# Patient Record
Sex: Female | Born: 1960 | Race: White | Hispanic: No | Marital: Married | Smoking: Former smoker
Health system: Southern US, Community
[De-identification: ages and names within clinical notes are randomized; demographics above are authoritative.]

## PROBLEM LIST (undated history)

## (undated) DIAGNOSIS — Z9989 Dependence on other enabling machines and devices: Secondary | ICD-10-CM

## (undated) DIAGNOSIS — G4730 Sleep apnea, unspecified: Secondary | ICD-10-CM

## (undated) DIAGNOSIS — G47419 Narcolepsy without cataplexy: Secondary | ICD-10-CM

## (undated) DIAGNOSIS — Z9889 Other specified postprocedural states: Secondary | ICD-10-CM

## (undated) DIAGNOSIS — G4733 Obstructive sleep apnea (adult) (pediatric): Secondary | ICD-10-CM

## (undated) DIAGNOSIS — G43909 Migraine, unspecified, not intractable, without status migrainosus: Secondary | ICD-10-CM

## (undated) DIAGNOSIS — E039 Hypothyroidism, unspecified: Secondary | ICD-10-CM

## (undated) DIAGNOSIS — D499 Neoplasm of unspecified behavior of unspecified site: Secondary | ICD-10-CM

## (undated) DIAGNOSIS — T4145XA Adverse effect of unspecified anesthetic, initial encounter: Secondary | ICD-10-CM

## (undated) DIAGNOSIS — N650 Deformity of reconstructed breast: Secondary | ICD-10-CM

## (undated) DIAGNOSIS — R51 Headache: Secondary | ICD-10-CM

## (undated) DIAGNOSIS — R112 Nausea with vomiting, unspecified: Secondary | ICD-10-CM

## (undated) DIAGNOSIS — F329 Major depressive disorder, single episode, unspecified: Secondary | ICD-10-CM

## (undated) DIAGNOSIS — K219 Gastro-esophageal reflux disease without esophagitis: Secondary | ICD-10-CM

## (undated) DIAGNOSIS — M1990 Unspecified osteoarthritis, unspecified site: Secondary | ICD-10-CM

## (undated) DIAGNOSIS — T8859XA Other complications of anesthesia, initial encounter: Secondary | ICD-10-CM

## (undated) DIAGNOSIS — F419 Anxiety disorder, unspecified: Secondary | ICD-10-CM

## (undated) DIAGNOSIS — C50919 Malignant neoplasm of unspecified site of unspecified female breast: Secondary | ICD-10-CM

## (undated) DIAGNOSIS — R0600 Dyspnea, unspecified: Secondary | ICD-10-CM

## (undated) DIAGNOSIS — G4710 Hypersomnia, unspecified: Secondary | ICD-10-CM

## (undated) DIAGNOSIS — M797 Fibromyalgia: Secondary | ICD-10-CM

## (undated) HISTORY — DX: Anxiety disorder, unspecified: F41.9

## (undated) HISTORY — DX: Major depressive disorder, single episode, unspecified: F32.9

## (undated) HISTORY — PX: GANGLION CYST EXCISION: SHX1691

## (undated) HISTORY — DX: Migraine, unspecified, not intractable, without status migrainosus: G43.909

## (undated) HISTORY — DX: Nausea with vomiting, unspecified: R11.2

## (undated) HISTORY — DX: Malignant neoplasm of unspecified site of unspecified female breast: C50.919

## (undated) HISTORY — DX: Neoplasm of unspecified behavior of unspecified site: D49.9

## (undated) HISTORY — DX: Sleep apnea, unspecified: G47.30

## (undated) HISTORY — PX: OTHER SURGICAL HISTORY: SHX169

## (undated) HISTORY — DX: Headache: R51

## (undated) HISTORY — DX: Other specified postprocedural states: Z98.890

## (undated) HISTORY — DX: Deformity of reconstructed breast: N65.0

## (undated) HISTORY — DX: Narcolepsy without cataplexy: G47.419

## (undated) HISTORY — DX: Obstructive sleep apnea (adult) (pediatric): G47.33

## (undated) HISTORY — DX: Obstructive sleep apnea (adult) (pediatric): Z99.89

## (undated) HISTORY — DX: Hypothyroidism, unspecified: E03.9

## (undated) HISTORY — DX: Hypersomnia, unspecified: G47.10

## (undated) HISTORY — DX: Fibromyalgia: M79.7

---

## 2001-02-15 ENCOUNTER — Other Ambulatory Visit: Admission: RE | Admit: 2001-02-15 | Discharge: 2001-02-15 | Payer: Self-pay | Admitting: Obstetrics and Gynecology

## 2002-02-06 HISTORY — PX: COLONOSCOPY W/ POLYPECTOMY: SHX1380

## 2002-02-20 ENCOUNTER — Encounter: Payer: Self-pay | Admitting: *Deleted

## 2002-02-20 ENCOUNTER — Ambulatory Visit (HOSPITAL_COMMUNITY): Admission: RE | Admit: 2002-02-20 | Discharge: 2002-02-20 | Payer: Self-pay | Admitting: *Deleted

## 2002-03-05 ENCOUNTER — Encounter: Payer: Self-pay | Admitting: Internal Medicine

## 2002-04-05 ENCOUNTER — Other Ambulatory Visit: Admission: RE | Admit: 2002-04-05 | Discharge: 2002-04-05 | Payer: Self-pay | Admitting: Obstetrics and Gynecology

## 2003-03-14 ENCOUNTER — Emergency Department (HOSPITAL_COMMUNITY): Admission: EM | Admit: 2003-03-14 | Discharge: 2003-03-15 | Payer: Self-pay | Admitting: Emergency Medicine

## 2003-11-09 HISTORY — PX: OTHER SURGICAL HISTORY: SHX169

## 2003-12-17 ENCOUNTER — Other Ambulatory Visit: Admission: RE | Admit: 2003-12-17 | Discharge: 2003-12-17 | Payer: Self-pay | Admitting: Obstetrics and Gynecology

## 2005-01-18 ENCOUNTER — Other Ambulatory Visit: Admission: RE | Admit: 2005-01-18 | Discharge: 2005-01-18 | Payer: Self-pay | Admitting: Obstetrics and Gynecology

## 2005-01-26 ENCOUNTER — Encounter: Admission: RE | Admit: 2005-01-26 | Discharge: 2005-01-26 | Payer: Self-pay | Admitting: Obstetrics and Gynecology

## 2005-01-28 ENCOUNTER — Ambulatory Visit (HOSPITAL_COMMUNITY): Admission: RE | Admit: 2005-01-28 | Discharge: 2005-01-28 | Payer: Self-pay | Admitting: Obstetrics and Gynecology

## 2005-02-11 ENCOUNTER — Observation Stay (HOSPITAL_COMMUNITY): Admission: RE | Admit: 2005-02-11 | Discharge: 2005-02-12 | Payer: Self-pay | Admitting: Obstetrics and Gynecology

## 2005-02-11 ENCOUNTER — Encounter (INDEPENDENT_AMBULATORY_CARE_PROVIDER_SITE_OTHER): Payer: Self-pay | Admitting: Specialist

## 2005-03-22 ENCOUNTER — Inpatient Hospital Stay (HOSPITAL_COMMUNITY): Admission: AD | Admit: 2005-03-22 | Discharge: 2005-03-22 | Payer: Self-pay | Admitting: Obstetrics and Gynecology

## 2005-08-11 ENCOUNTER — Ambulatory Visit: Payer: Self-pay | Admitting: Internal Medicine

## 2005-09-01 ENCOUNTER — Encounter: Admission: RE | Admit: 2005-09-01 | Discharge: 2005-09-01 | Payer: Self-pay | Admitting: Obstetrics and Gynecology

## 2005-10-05 ENCOUNTER — Ambulatory Visit: Payer: Self-pay | Admitting: Internal Medicine

## 2006-02-11 ENCOUNTER — Encounter: Admission: RE | Admit: 2006-02-11 | Discharge: 2006-02-11 | Payer: Self-pay | Admitting: Orthopedic Surgery

## 2006-02-23 ENCOUNTER — Encounter: Admission: RE | Admit: 2006-02-23 | Discharge: 2006-02-23 | Payer: Self-pay | Admitting: Orthopedic Surgery

## 2006-03-09 ENCOUNTER — Encounter: Admission: RE | Admit: 2006-03-09 | Discharge: 2006-03-09 | Payer: Self-pay | Admitting: Orthopedic Surgery

## 2006-03-25 ENCOUNTER — Encounter: Admission: RE | Admit: 2006-03-25 | Discharge: 2006-03-25 | Payer: Self-pay | Admitting: Orthopedic Surgery

## 2006-03-31 ENCOUNTER — Encounter: Admission: RE | Admit: 2006-03-31 | Discharge: 2006-03-31 | Payer: Self-pay | Admitting: Obstetrics and Gynecology

## 2006-04-13 ENCOUNTER — Ambulatory Visit: Payer: Self-pay | Admitting: Internal Medicine

## 2006-04-13 ENCOUNTER — Emergency Department (HOSPITAL_COMMUNITY): Admission: EM | Admit: 2006-04-13 | Discharge: 2006-04-13 | Payer: Self-pay | Admitting: Emergency Medicine

## 2006-11-08 HISTORY — PX: MASTECTOMY: SHX3

## 2006-11-08 HISTORY — PX: OTHER SURGICAL HISTORY: SHX169

## 2006-12-26 ENCOUNTER — Encounter: Admission: RE | Admit: 2006-12-26 | Discharge: 2006-12-26 | Payer: Self-pay | Admitting: Orthopaedic Surgery

## 2007-01-06 ENCOUNTER — Encounter: Admission: RE | Admit: 2007-01-06 | Discharge: 2007-01-06 | Payer: Self-pay | Admitting: Orthopaedic Surgery

## 2007-01-23 ENCOUNTER — Ambulatory Visit: Payer: Self-pay | Admitting: Internal Medicine

## 2007-07-04 ENCOUNTER — Encounter (INDEPENDENT_AMBULATORY_CARE_PROVIDER_SITE_OTHER): Payer: Self-pay | Admitting: Diagnostic Radiology

## 2007-07-04 ENCOUNTER — Encounter: Admission: RE | Admit: 2007-07-04 | Discharge: 2007-07-04 | Payer: Self-pay | Admitting: Obstetrics and Gynecology

## 2007-07-05 ENCOUNTER — Encounter: Payer: Self-pay | Admitting: Internal Medicine

## 2007-07-05 DIAGNOSIS — Z860100 Personal history of colon polyps, unspecified: Secondary | ICD-10-CM | POA: Insufficient documentation

## 2007-07-05 DIAGNOSIS — N840 Polyp of corpus uteri: Secondary | ICD-10-CM

## 2007-07-05 DIAGNOSIS — Z87410 Personal history of cervical dysplasia: Secondary | ICD-10-CM

## 2007-07-05 DIAGNOSIS — IMO0001 Reserved for inherently not codable concepts without codable children: Secondary | ICD-10-CM | POA: Insufficient documentation

## 2007-07-05 DIAGNOSIS — F329 Major depressive disorder, single episode, unspecified: Secondary | ICD-10-CM | POA: Insufficient documentation

## 2007-07-11 ENCOUNTER — Encounter: Admission: RE | Admit: 2007-07-11 | Discharge: 2007-07-11 | Payer: Self-pay | Admitting: Obstetrics and Gynecology

## 2007-07-13 ENCOUNTER — Ambulatory Visit: Payer: Self-pay | Admitting: Oncology

## 2007-07-18 ENCOUNTER — Ambulatory Visit (HOSPITAL_COMMUNITY): Admission: RE | Admit: 2007-07-18 | Discharge: 2007-07-18 | Payer: Self-pay | Admitting: General Surgery

## 2007-07-19 LAB — CBC WITH DIFFERENTIAL/PLATELET
Basophils Absolute: 0 10*3/uL (ref 0.0–0.1)
EOS%: 1 % (ref 0.0–7.0)
Eosinophils Absolute: 0.1 10*3/uL (ref 0.0–0.5)
HGB: 13.2 g/dL (ref 11.6–15.9)
MCH: 29.5 pg (ref 26.0–34.0)
NEUT#: 4.3 10*3/uL (ref 1.5–6.5)
RBC: 4.47 10*6/uL (ref 3.70–5.32)
RDW: 12.8 % (ref 11.3–14.5)
lymph#: 1.5 10*3/uL (ref 0.9–3.3)

## 2007-07-21 ENCOUNTER — Ambulatory Visit (HOSPITAL_COMMUNITY): Admission: RE | Admit: 2007-07-21 | Discharge: 2007-07-21 | Payer: Self-pay | Admitting: General Surgery

## 2007-07-21 ENCOUNTER — Encounter: Payer: Self-pay | Admitting: Oncology

## 2007-07-24 LAB — COMPREHENSIVE METABOLIC PANEL
AST: 15 U/L (ref 0–37)
Albumin: 4.3 g/dL (ref 3.5–5.2)
BUN: 13 mg/dL (ref 6–23)
Calcium: 9.4 mg/dL (ref 8.4–10.5)
Chloride: 104 mEq/L (ref 96–112)
Potassium: 3.5 mEq/L (ref 3.5–5.3)
Sodium: 139 mEq/L (ref 135–145)
Total Protein: 6.7 g/dL (ref 6.0–8.3)

## 2007-07-24 LAB — VITAMIN D PNL(25-HYDRXY+1,25-DIHY)-BLD: Vit D, 1,25-Dihydroxy: 64 pg/mL — ABNORMAL HIGH (ref 6–62)

## 2007-07-25 ENCOUNTER — Ambulatory Visit (HOSPITAL_COMMUNITY): Payer: Self-pay | Admitting: Oncology

## 2007-07-25 ENCOUNTER — Encounter (HOSPITAL_COMMUNITY): Admission: RE | Admit: 2007-07-25 | Discharge: 2007-08-08 | Payer: Self-pay | Admitting: Oncology

## 2007-07-28 ENCOUNTER — Ambulatory Visit: Payer: Self-pay | Admitting: Internal Medicine

## 2007-07-28 ENCOUNTER — Ambulatory Visit (HOSPITAL_COMMUNITY): Admission: RE | Admit: 2007-07-28 | Discharge: 2007-07-28 | Payer: Self-pay | Admitting: Oncology

## 2007-08-15 ENCOUNTER — Encounter (HOSPITAL_COMMUNITY): Admission: RE | Admit: 2007-08-15 | Discharge: 2007-09-14 | Payer: Self-pay | Admitting: Oncology

## 2007-08-23 ENCOUNTER — Ambulatory Visit: Admission: RE | Admit: 2007-08-23 | Discharge: 2007-10-30 | Payer: Self-pay | Admitting: Radiation Oncology

## 2007-09-06 ENCOUNTER — Ambulatory Visit: Payer: Self-pay | Admitting: Cardiology

## 2007-09-06 ENCOUNTER — Encounter: Payer: Self-pay | Admitting: Internal Medicine

## 2007-09-12 ENCOUNTER — Ambulatory Visit (HOSPITAL_COMMUNITY): Payer: Self-pay | Admitting: Oncology

## 2007-09-19 ENCOUNTER — Encounter: Admission: RE | Admit: 2007-09-19 | Discharge: 2007-09-19 | Payer: Self-pay | Admitting: Oncology

## 2007-09-21 ENCOUNTER — Encounter: Admission: RE | Admit: 2007-09-21 | Discharge: 2007-09-21 | Payer: Self-pay | Admitting: General Surgery

## 2007-09-26 ENCOUNTER — Encounter (HOSPITAL_COMMUNITY): Admission: RE | Admit: 2007-09-26 | Discharge: 2007-10-26 | Payer: Self-pay | Admitting: Oncology

## 2007-09-29 ENCOUNTER — Encounter: Payer: Self-pay | Admitting: Internal Medicine

## 2007-10-13 ENCOUNTER — Ambulatory Visit: Payer: Self-pay | Admitting: Oncology

## 2007-10-20 ENCOUNTER — Telehealth: Payer: Self-pay | Admitting: Internal Medicine

## 2007-10-23 ENCOUNTER — Encounter: Payer: Self-pay | Admitting: Internal Medicine

## 2007-10-27 ENCOUNTER — Encounter: Payer: Self-pay | Admitting: Internal Medicine

## 2007-10-30 ENCOUNTER — Encounter (INDEPENDENT_AMBULATORY_CARE_PROVIDER_SITE_OTHER): Payer: Self-pay | Admitting: General Surgery

## 2007-10-30 ENCOUNTER — Ambulatory Visit (HOSPITAL_COMMUNITY): Admission: RE | Admit: 2007-10-30 | Discharge: 2007-10-31 | Payer: Self-pay | Admitting: General Surgery

## 2007-11-21 ENCOUNTER — Encounter: Payer: Self-pay | Admitting: Internal Medicine

## 2007-11-21 ENCOUNTER — Ambulatory Visit (HOSPITAL_COMMUNITY): Payer: Self-pay | Admitting: Oncology

## 2007-11-28 ENCOUNTER — Ambulatory Visit: Admission: RE | Admit: 2007-11-28 | Discharge: 2007-12-22 | Payer: Self-pay | Admitting: Radiation Oncology

## 2007-12-22 ENCOUNTER — Encounter: Payer: Self-pay | Admitting: Internal Medicine

## 2007-12-28 ENCOUNTER — Ambulatory Visit: Admission: RE | Admit: 2007-12-28 | Discharge: 2008-03-05 | Payer: Self-pay | Admitting: Radiation Oncology

## 2008-01-01 ENCOUNTER — Emergency Department (HOSPITAL_COMMUNITY): Admission: EM | Admit: 2008-01-01 | Discharge: 2008-01-02 | Payer: Self-pay | Admitting: Emergency Medicine

## 2008-01-04 ENCOUNTER — Telehealth: Payer: Self-pay | Admitting: Internal Medicine

## 2008-01-05 ENCOUNTER — Ambulatory Visit: Payer: Self-pay | Admitting: Endocrinology

## 2008-01-05 DIAGNOSIS — R05 Cough: Secondary | ICD-10-CM

## 2008-02-20 ENCOUNTER — Encounter: Payer: Self-pay | Admitting: Internal Medicine

## 2008-02-22 ENCOUNTER — Ambulatory Visit: Payer: Self-pay | Admitting: Internal Medicine

## 2008-02-22 DIAGNOSIS — M545 Low back pain: Secondary | ICD-10-CM

## 2008-02-22 DIAGNOSIS — R011 Cardiac murmur, unspecified: Secondary | ICD-10-CM

## 2008-02-22 DIAGNOSIS — R5383 Other fatigue: Secondary | ICD-10-CM

## 2008-02-22 DIAGNOSIS — L03319 Cellulitis of trunk, unspecified: Secondary | ICD-10-CM

## 2008-02-22 DIAGNOSIS — R5381 Other malaise: Secondary | ICD-10-CM | POA: Insufficient documentation

## 2008-02-22 DIAGNOSIS — Z853 Personal history of malignant neoplasm of breast: Secondary | ICD-10-CM

## 2008-02-22 DIAGNOSIS — R42 Dizziness and giddiness: Secondary | ICD-10-CM

## 2008-02-22 DIAGNOSIS — L02219 Cutaneous abscess of trunk, unspecified: Secondary | ICD-10-CM

## 2008-02-22 DIAGNOSIS — R002 Palpitations: Secondary | ICD-10-CM | POA: Insufficient documentation

## 2008-02-22 LAB — CONVERTED CEMR LAB: Vit D, 1,25-Dihydroxy: 32 (ref 30–89)

## 2008-02-23 LAB — CONVERTED CEMR LAB
Albumin: 4 g/dL (ref 3.5–5.2)
BUN: 9 mg/dL (ref 6–23)
Bilirubin Urine: NEGATIVE
Bilirubin, Direct: 0.1 mg/dL (ref 0.0–0.3)
Calcium: 10.1 mg/dL (ref 8.4–10.5)
Eosinophils Absolute: 0.1 10*3/uL (ref 0.0–0.7)
GFR calc Af Amer: 115 mL/min
Glucose, Bld: 84 mg/dL (ref 70–99)
HCT: 41 % (ref 36.0–46.0)
Hemoglobin: 13.9 g/dL (ref 12.0–15.0)
Ketones, ur: NEGATIVE mg/dL
MCV: 82.9 fL (ref 78.0–100.0)
Monocytes Absolute: 0.5 10*3/uL (ref 0.1–1.0)
Monocytes Relative: 10.9 % (ref 3.0–12.0)
Neutro Abs: 3.5 10*3/uL (ref 1.4–7.7)
Nitrite: NEGATIVE
RDW: 13.1 % (ref 11.5–14.6)
Sodium: 140 meq/L (ref 135–145)
TSH: 2.12 microintl units/mL (ref 0.35–5.50)
Total Protein, Urine: NEGATIVE mg/dL
Total Protein: 6.9 g/dL (ref 6.0–8.3)
Urine Glucose: NEGATIVE mg/dL
pH: 7.5 (ref 5.0–8.0)

## 2008-02-28 ENCOUNTER — Encounter: Payer: Self-pay | Admitting: Internal Medicine

## 2008-02-28 ENCOUNTER — Ambulatory Visit: Payer: Self-pay

## 2008-02-29 ENCOUNTER — Telehealth: Payer: Self-pay | Admitting: Internal Medicine

## 2008-03-22 ENCOUNTER — Ambulatory Visit (HOSPITAL_COMMUNITY): Payer: Self-pay | Admitting: Oncology

## 2008-03-22 ENCOUNTER — Encounter: Payer: Self-pay | Admitting: Internal Medicine

## 2008-05-08 HISTORY — PX: OTHER SURGICAL HISTORY: SHX169

## 2008-05-22 ENCOUNTER — Encounter: Payer: Self-pay | Admitting: Internal Medicine

## 2008-07-23 ENCOUNTER — Encounter (HOSPITAL_COMMUNITY): Admission: RE | Admit: 2008-07-23 | Discharge: 2008-08-05 | Payer: Self-pay | Admitting: Oncology

## 2008-07-23 ENCOUNTER — Encounter: Payer: Self-pay | Admitting: Internal Medicine

## 2008-07-23 ENCOUNTER — Ambulatory Visit (HOSPITAL_COMMUNITY): Payer: Self-pay | Admitting: Oncology

## 2008-10-25 ENCOUNTER — Encounter: Payer: Self-pay | Admitting: Internal Medicine

## 2008-11-15 DIAGNOSIS — K648 Other hemorrhoids: Secondary | ICD-10-CM | POA: Insufficient documentation

## 2008-11-15 DIAGNOSIS — K5730 Diverticulosis of large intestine without perforation or abscess without bleeding: Secondary | ICD-10-CM | POA: Insufficient documentation

## 2008-11-18 ENCOUNTER — Ambulatory Visit: Payer: Self-pay | Admitting: Internal Medicine

## 2008-11-18 DIAGNOSIS — K625 Hemorrhage of anus and rectum: Secondary | ICD-10-CM

## 2008-11-18 DIAGNOSIS — K5900 Constipation, unspecified: Secondary | ICD-10-CM | POA: Insufficient documentation

## 2008-11-19 ENCOUNTER — Encounter: Admission: RE | Admit: 2008-11-19 | Discharge: 2008-11-19 | Payer: Self-pay | Admitting: Oncology

## 2008-11-19 ENCOUNTER — Encounter: Payer: Self-pay | Admitting: Internal Medicine

## 2008-11-21 ENCOUNTER — Ambulatory Visit: Payer: Self-pay | Admitting: Internal Medicine

## 2008-11-21 ENCOUNTER — Encounter: Payer: Self-pay | Admitting: Internal Medicine

## 2008-11-26 ENCOUNTER — Encounter: Payer: Self-pay | Admitting: Internal Medicine

## 2008-12-01 ENCOUNTER — Emergency Department (HOSPITAL_COMMUNITY): Admission: EM | Admit: 2008-12-01 | Discharge: 2008-12-02 | Payer: Self-pay | Admitting: Emergency Medicine

## 2008-12-02 ENCOUNTER — Ambulatory Visit: Payer: Self-pay | Admitting: *Deleted

## 2008-12-02 ENCOUNTER — Inpatient Hospital Stay (HOSPITAL_COMMUNITY): Admission: AD | Admit: 2008-12-02 | Discharge: 2008-12-06 | Payer: Self-pay | Admitting: *Deleted

## 2008-12-10 ENCOUNTER — Ambulatory Visit: Payer: Self-pay | Admitting: Psychiatry

## 2008-12-11 ENCOUNTER — Telehealth: Payer: Self-pay | Admitting: Internal Medicine

## 2008-12-17 ENCOUNTER — Encounter: Payer: Self-pay | Admitting: Internal Medicine

## 2008-12-17 ENCOUNTER — Ambulatory Visit: Payer: Self-pay | Admitting: Psychiatry

## 2009-02-24 ENCOUNTER — Ambulatory Visit (HOSPITAL_COMMUNITY): Payer: Self-pay | Admitting: Oncology

## 2009-02-24 ENCOUNTER — Encounter: Payer: Self-pay | Admitting: Internal Medicine

## 2009-02-25 ENCOUNTER — Encounter: Admission: RE | Admit: 2009-02-25 | Discharge: 2009-02-25 | Payer: Self-pay | Admitting: Oncology

## 2009-04-08 ENCOUNTER — Encounter: Payer: Self-pay | Admitting: Internal Medicine

## 2009-04-14 ENCOUNTER — Encounter: Payer: Self-pay | Admitting: Internal Medicine

## 2009-05-08 ENCOUNTER — Encounter: Payer: Self-pay | Admitting: Internal Medicine

## 2009-05-09 ENCOUNTER — Encounter (INDEPENDENT_AMBULATORY_CARE_PROVIDER_SITE_OTHER): Payer: Self-pay | Admitting: Orthopedic Surgery

## 2009-05-09 ENCOUNTER — Ambulatory Visit (HOSPITAL_BASED_OUTPATIENT_CLINIC_OR_DEPARTMENT_OTHER): Admission: RE | Admit: 2009-05-09 | Discharge: 2009-05-09 | Payer: Self-pay | Admitting: Orthopedic Surgery

## 2009-08-12 ENCOUNTER — Encounter: Payer: Self-pay | Admitting: Internal Medicine

## 2009-08-25 ENCOUNTER — Ambulatory Visit (HOSPITAL_COMMUNITY): Payer: Self-pay | Admitting: Oncology

## 2009-08-25 ENCOUNTER — Encounter (HOSPITAL_COMMUNITY): Admission: RE | Admit: 2009-08-25 | Discharge: 2009-09-24 | Payer: Self-pay | Admitting: Oncology

## 2009-08-25 ENCOUNTER — Encounter: Payer: Self-pay | Admitting: Internal Medicine

## 2009-11-04 ENCOUNTER — Encounter: Admission: RE | Admit: 2009-11-04 | Discharge: 2009-11-04 | Payer: Self-pay | Admitting: General Surgery

## 2009-11-08 HISTORY — PX: CARPAL TUNNEL RELEASE: SHX101

## 2009-11-14 ENCOUNTER — Encounter: Admission: RE | Admit: 2009-11-14 | Discharge: 2009-12-25 | Payer: Self-pay | Admitting: Diagnostic Radiology

## 2009-11-24 ENCOUNTER — Telehealth (INDEPENDENT_AMBULATORY_CARE_PROVIDER_SITE_OTHER): Payer: Self-pay | Admitting: *Deleted

## 2009-12-23 ENCOUNTER — Ambulatory Visit: Payer: Self-pay | Admitting: Psychiatry

## 2010-01-20 ENCOUNTER — Ambulatory Visit (HOSPITAL_BASED_OUTPATIENT_CLINIC_OR_DEPARTMENT_OTHER): Admission: RE | Admit: 2010-01-20 | Discharge: 2010-01-20 | Payer: Self-pay | Admitting: Orthopedic Surgery

## 2010-02-17 ENCOUNTER — Ambulatory Visit (HOSPITAL_COMMUNITY): Payer: Self-pay | Admitting: Oncology

## 2010-03-10 ENCOUNTER — Emergency Department (HOSPITAL_COMMUNITY): Admission: EM | Admit: 2010-03-10 | Discharge: 2010-03-11 | Payer: Self-pay | Admitting: Emergency Medicine

## 2010-03-11 ENCOUNTER — Inpatient Hospital Stay (HOSPITAL_COMMUNITY): Admission: AD | Admit: 2010-03-11 | Discharge: 2010-03-17 | Payer: Self-pay | Admitting: Psychiatry

## 2010-03-11 ENCOUNTER — Ambulatory Visit: Payer: Self-pay | Admitting: Psychiatry

## 2010-03-18 ENCOUNTER — Other Ambulatory Visit (HOSPITAL_COMMUNITY): Admission: RE | Admit: 2010-03-18 | Discharge: 2010-03-31 | Payer: Self-pay | Admitting: Psychiatry

## 2010-03-23 ENCOUNTER — Ambulatory Visit: Payer: Self-pay | Admitting: Psychiatry

## 2010-04-22 ENCOUNTER — Ambulatory Visit (HOSPITAL_COMMUNITY): Payer: Self-pay | Admitting: Psychiatry

## 2010-04-27 ENCOUNTER — Ambulatory Visit: Payer: Self-pay | Admitting: Psychiatry

## 2010-04-27 ENCOUNTER — Emergency Department (HOSPITAL_COMMUNITY): Admission: EM | Admit: 2010-04-27 | Discharge: 2010-04-27 | Payer: Self-pay | Admitting: Emergency Medicine

## 2010-04-27 ENCOUNTER — Inpatient Hospital Stay (HOSPITAL_COMMUNITY): Admission: AD | Admit: 2010-04-27 | Discharge: 2010-05-01 | Payer: Self-pay | Admitting: Psychiatry

## 2010-07-02 ENCOUNTER — Ambulatory Visit: Payer: Self-pay | Admitting: Oncology

## 2010-08-04 ENCOUNTER — Ambulatory Visit: Payer: Self-pay | Admitting: Oncology

## 2010-08-18 ENCOUNTER — Ambulatory Visit (HOSPITAL_COMMUNITY): Payer: Self-pay | Admitting: Oncology

## 2010-09-08 ENCOUNTER — Ambulatory Visit: Payer: Self-pay | Admitting: Oncology

## 2010-11-05 ENCOUNTER — Encounter
Admission: RE | Admit: 2010-11-05 | Discharge: 2010-11-05 | Payer: Self-pay | Source: Home / Self Care | Attending: Oncology | Admitting: Oncology

## 2010-11-29 ENCOUNTER — Encounter: Payer: Self-pay | Admitting: Orthopaedic Surgery

## 2010-11-29 ENCOUNTER — Encounter (HOSPITAL_COMMUNITY): Payer: Self-pay | Admitting: Oncology

## 2010-12-08 NOTE — Progress Notes (Signed)
Summary: Records request   Records faxed to [facility]. Lenard Forth  November 24, 2009 12:52 PM

## 2011-01-24 LAB — DIFFERENTIAL
Eosinophils Absolute: 0 10*3/uL (ref 0.0–0.7)
Eosinophils Relative: 0 % (ref 0–5)
Lymphs Abs: 1.3 10*3/uL (ref 0.7–4.0)
Monocytes Relative: 5 % (ref 3–12)

## 2011-01-24 LAB — BASIC METABOLIC PANEL
Chloride: 99 mEq/L (ref 96–112)
GFR calc Af Amer: >60 mL/min (ref >60)
Potassium: 3.6 mEq/L (ref 3.5–5.1)

## 2011-01-24 LAB — CBC
HCT: 42.2 % (ref 36.0–46.0)
MCV: 86.5 fL (ref 78.0–100.0)
RBC: 4.87 MIL/uL (ref 3.87–5.11)
WBC: 8.4 10*3/uL (ref 4.0–10.5)

## 2011-01-24 LAB — ETHANOL: Alcohol, Ethyl (B): 5 mg/dL (ref 0–10)

## 2011-01-26 LAB — CBC
MCHC: 33 g/dL (ref 30.0–36.0)
Platelets: 234 10*3/uL (ref 150–400)
RBC: 4.99 MIL/uL (ref 3.87–5.11)
RDW: 13.5 % (ref 11.5–15.5)

## 2011-01-26 LAB — BASIC METABOLIC PANEL
BUN: 12 mg/dL (ref 6–23)
CO2: 30 mEq/L (ref 19–32)
Calcium: 9.9 mg/dL (ref 8.4–10.5)
Creatinine, Ser: 0.85 mg/dL (ref 0.4–1.2)
GFR calc Af Amer: >60 mL/min (ref >60)

## 2011-01-26 LAB — RAPID URINE DRUG SCREEN, HOSP PERFORMED
Amphetamines: NOT DETECTED
Opiates: POSITIVE — AB
Tetrahydrocannabinol: NOT DETECTED

## 2011-01-26 LAB — DIFFERENTIAL
Basophils Absolute: 0 10*3/uL (ref 0.0–0.1)
Basophils Relative: 0 % (ref 0–1)
Monocytes Relative: 7 % (ref 3–12)
Neutro Abs: 3.9 10*3/uL (ref 1.7–7.7)
Neutrophils Relative %: 59 % (ref 43–77)

## 2011-01-26 LAB — ETHANOL: Alcohol, Ethyl (B): <5 mg/dL (ref 0–10)

## 2011-01-31 LAB — POCT HEMOGLOBIN-HEMACUE: Hemoglobin: 14.3 g/dL (ref 12.0–15.0)

## 2011-02-11 LAB — DIFFERENTIAL
Basophils Absolute: 0 10*3/uL (ref 0.0–0.1)
Eosinophils Absolute: 0.1 10*3/uL (ref 0.0–0.7)
Eosinophils Relative: 2 % (ref 0–5)
Monocytes Absolute: 0.4 10*3/uL (ref 0.1–1.0)

## 2011-02-11 LAB — CBC
RBC: 4.69 MIL/uL (ref 3.87–5.11)
WBC: 6.3 10*3/uL (ref 4.0–10.5)

## 2011-02-11 LAB — COMPREHENSIVE METABOLIC PANEL
ALT: 46 U/L — ABNORMAL HIGH (ref 0–35)
AST: 36 U/L (ref 0–37)
Albumin: 4.3 g/dL (ref 3.5–5.2)
CO2: 31 mEq/L (ref 19–32)
Chloride: 100 mEq/L (ref 96–112)
GFR calc Af Amer: >60 mL/min (ref >60)
GFR calc non Af Amer: >60 mL/min (ref >60)
Potassium: 3.6 mEq/L (ref 3.5–5.1)
Sodium: 137 mEq/L (ref 135–145)
Total Bilirubin: 0.6 mg/dL (ref 0.3–1.2)

## 2011-02-17 ENCOUNTER — Ambulatory Visit (HOSPITAL_COMMUNITY): Payer: Self-pay | Admitting: Oncology

## 2011-02-19 ENCOUNTER — Encounter (HOSPITAL_COMMUNITY): Payer: BC Managed Care – PPO | Attending: Oncology | Admitting: Oncology

## 2011-02-19 DIAGNOSIS — C50919 Malignant neoplasm of unspecified site of unspecified female breast: Secondary | ICD-10-CM

## 2011-02-22 LAB — CBC
HCT: 40.3 % (ref 36.0–46.0)
Platelets: 234 10*3/uL (ref 150–400)
RDW: 13.7 % (ref 11.5–15.5)

## 2011-02-22 LAB — RAPID URINE DRUG SCREEN, HOSP PERFORMED: Cocaine: NOT DETECTED

## 2011-02-22 LAB — BASIC METABOLIC PANEL
BUN: 9 mg/dL (ref 6–23)
Calcium: 9.7 mg/dL (ref 8.4–10.5)
GFR calc non Af Amer: >60 mL/min (ref >60)
Glucose, Bld: 124 mg/dL — ABNORMAL HIGH (ref 70–99)
Potassium: 3.7 mEq/L (ref 3.5–5.1)

## 2011-02-22 LAB — ETHANOL: Alcohol, Ethyl (B): <5 mg/dL (ref 0–10)

## 2011-03-23 NOTE — Procedures (Signed)
[name], [name]               ACCOUNT NO.:  [id number]   MEDICAL RECORD NO.:  [id number]          PATIENT TYPE:  REC   LOCATION:  SPCLP                         FACILITY:  [facility]   PHYSICIAN:  Gerrit Friends. Dietrich Pates, MD, FACCDATE OF BIRTH:  04/12/1961   DATE OF PROCEDURE:  10/04/2007  DATE OF DISCHARGE:                                ECHOCARDIOGRAM   CLINICAL DATA:  The patient is a 50 year old woman undergoing  chemotherapeutic treatment for carcinoma of the breast.   MEASUREMENTS:  Aorta 3.2, left atrium 3.4, septum 1.0, posterior wall  1.0, LV diastole 4.4, LV systole 3.2.   FINDINGS:  1. Technically adequate echocardiographic study.  2. Normal left atrium, right atrium and right ventricle.  3. Normal aortic, mitral, tricuspid and pulmonic valves.  Normal      proximal aorta and pulmonary artery.  4. Delicate mitral valve with flat coaptation representing borderline      prolapse.  5. Normal internal dimension, wall thickness, regional and global      function of the left ventricle.  Estimated ejection fraction is      60%.  6. Normal inferior vena cava.  7. Normal Doppler examination.  8. Comparison with prior study of September 06, 2007:  No significant      interval change.      Gerrit Friends. Dietrich Pates, MD, [facility]  Electronically Signed     RMR/MEDQ  D:  10/05/2007  T:  10/06/2007  Job:  [id number]

## 2011-03-23 NOTE — Procedures (Signed)
NAMEVINCENTA, [name]               ACCOUNT NO.:  [id number]   MEDICAL RECORD NO.:  [id number]          PATIENT TYPE:  RAD   LOCATION:  RDNC                          FACILITY:  [facility]   PHYSICIAN:  Gerrit Friends. Dietrich Pates, MD, FACCDATE OF BIRTH:  1961-03-17   DATE OF PROCEDURE:  DATE OF DISCHARGE:                                ECHOCARDIOGRAM   CLINICAL DATA:  A 50 year old woman receiving chemotherapy for carcinoma  of the breast.  M-mode aorta 3.0, left atrium 3.4, septum 1.1, posterior  wall 1.1, LV diastole 4.1, LV systole 3.0.  1. Technically adequate echocardiographic study.  2. Normal left atrium, right atrium and right ventricle.  3. Normal and trileaflet aortic valve.  Normal tricuspid and pulmonic      valves.  Delicate mitral valve with borderline prolapse and no      regurgitation.  4. Normal proximal pulmonary artery and proximal ascending aorta.  5. Normal internal dimension, wall thickness, regional and global      function of the left ventricle.  Estimated ejection fraction is      0.60 - 0.65.  6. Normal IVC.  7. Comparison with prior study of July 29, 2007:  No significant      interval change.      Gerrit Friends. Dietrich Pates, MD, [facility]  Electronically Signed     RMR/MEDQ  D:  09/06/2007  T:  09/07/2007  Job:  [id number]

## 2011-03-23 NOTE — H&P (Signed)
NAMECARLISHA, [name]               ACCOUNT NO.:  [id number]   MEDICAL RECORD NO.:  [id number]          PATIENT TYPE:  IPS   LOCATION:  0300                          FACILITY:  [facility]   PHYSICIAN:  Jasmine Pang, M.D. DATE OF BIRTH:  1961/09/13   DATE OF ADMISSION:  12/02/2008  DATE OF DISCHARGE:                       PSYCHIATRIC ADMISSION ASSESSMENT   IDENTIFYING INFORMATION:  This is a 50 year old female voluntarily  admitted on December 02, 2008.   HISTORY OF PRESENT ILLNESS:  The patient presents with a history of  depression and suicidal thoughts.  She has no specific plans to harm  herself, reporting that her pain level is out of control, her  medications are not working.  The patient reports having a history of  breast cancer, received some news on Friday that her medication for  sleep could interact with her medication to help with her breast cancer  and it made her very upset.  She stopped using her medication, was  having difficulty sleeping and feeling very hopeless.  Has  returned  back to work and feeling somewhat overwhelmed.   PAST PSYCHIATRIC HISTORY:  This is her first admission to [facility]  No other inpatient admissions.  No current outpatient  mental health therapy.   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   FAMILY HISTORY:  None.   ALCOHOL/DRUG HISTORY:  She denies any alcohol or drug use.   PRIMARY CARE [name]:  1. The patient sees Dr. Corliss Skains at [facility], at [phone] who manages her      fibromyalgia.  2. She also has a Hotel manager at [employer], who is      Dr. Glenford Peers at phone number [phone].   MEDICAL PROBLEMS:  1. History of breast cancer status post mastectomy and breast      reconstruction.  Her breast reconstruction was done June 06, 2008.  2. History of fibromyalgia.   MEDICATIONS:  1. Lexapro 20 mg daily.  2. Ultram 50 mg 2 b.i.d., prescribed by Dr. Corliss Skains.  3. Xanax 0.5 mg b.i.d.  4. Lidocaine patches as needed at night.  5. Neurontin 300 mg t.i.d.  6. Currently off her tamoxifen for a 6 week period.   DRUG ALLERGIES:  SULFA.   PHYSICAL EXAMINATION:  This is a middle-aged female.  She was fully  assessed at [facility] Emergency Department.  Her physical exam was  reviewed with no significant findings.  Temperature 96.7, 90 heart rate,  18 respirations, blood pressure is 117/78.   LABORATORY DATA:  Alcohol level less than 5.  Urine drug screen is  positive for benzodiazepines and positive for opiates.   MENTAL STATUS EXAM:  This is a fully alert, cooperative female,  appropriately dressed.  She has good eye contact.  She has normal motor  behavior.  Speech is normal, normal pace and tone.  Her mood is  depressed, anxious and hopeless.  The patient appears depressed and  appears very anxious.  Does have some hesitation at times.  Her mouth  moves, but is not talking.  Thought processes are coherent and goal  directed.  No evidence of any delusional statements.  Cognitive function  intact.  Her memory is good.  Judgment and insight is fair.  She appears  sincere.   AXIS I:  Major depressive disorder.  AXIS II:  Deferred.  AXIS III:  Fibromyalgia, history of breast cancer.  AXIS IV:  Psychosocial problems.  Medical problems.  AXIS V:  Current is 30-35.   PLAN:  Our plan is to clarify her medications.  We will contact the  patient's Oncologist and Dr. Corliss Skains who is managing her fibromyalgia  for recommendations and further insight.  We will have a family session  with her husband.  The patient will be in the blue group.  We will  provide emotional reassurance to the patient.  The patient may benefit  from some individual therapy.  Her tentative length of stay at this time  is 3-5 days.      Landry Corporal, N.P.      Jasmine Pang, M.D.  Electronically Signed     JO/MEDQ  D:  12/05/2008  T:  12/05/2008  Job:  [id number]

## 2011-03-23 NOTE — Procedures (Signed)
NAMESHIRLYN, SAVIN               ACCOUNT NO.:  [id number]   MEDICAL RECORD NO.:  [id number]          PATIENT TYPE:  OUT   LOCATION:  RDC                           FACILITY:  [facility]   PHYSICIAN:  Pricilla Riffle, MD, FACCDATE OF BIRTH:  10/13/1961   DATE OF PROCEDURE:  DATE OF DISCHARGE:                                ECHOCARDIOGRAM   TEST INDICATIONS:  A 50 year old being evaluated for chemotherapy.   2-D ECHOCARDIOGRAM WITH ECHOCARDIOGRAPHIC DOPPLER:  Left ventricle  normal in size.  End-diastolic dimension of 35 mm.  The interventricular  septum and posterior wall were normal in thickness.  Left atrium was  normal at 33 mm.  Right atrium and right ventricle are normal.  Aortic  root was normal at 28 mm.   The aortic valve was normal with no insufficiency.  Mitral valve was  normal with no insufficiency.  Pulmonic valve was normal with no  insufficiency.  Tricuspid valve was normal with trivial insufficiency.   Overall LV systolic function was normal, with an LVEF of approximately  55-65%.  RV EF was normal.  No pericardial effusion was seen.  IVC was  normal.      Pricilla Riffle, MD, [facility]  Electronically Signed     PVR/MEDQ  D:  07/28/2007  T:  07/29/2007  Job:  [id number]

## 2011-03-23 NOTE — Assessment & Plan Note (Signed)
[facility]                                 ON-CALL NOTE   KHADIJATOU, BORAK                     MRN:          [id number]  DATE:12/01/2008                            DOB:          1961/01/04    TIME RECEIVED:  12:38 p.m.   CALLER:  Taleshia Luff.   She sees Dr. Posey Rea.   I do not have a full date of birth, her birth day I know was [birth year]  [birth year], I just do not have a year.  Thank you.   TELEPHONE:  [phone].   The patient is status post bilateral mastectomy for cancer and had  reconstruction surgery this past July.  She has been having increasing  pain over the past week, and over the past several days, has had severe  pain at the surgical sites in both sides of the chest.  My advice is for  her to go to the emergency room tonight.     Tera Mater. Clent Ridges, MD  Electronically Signed    SAF/MedQ  DD: 12/01/2008  DT: 12/02/2008  Job #: [id number]

## 2011-03-23 NOTE — Op Note (Signed)
NAMEADAMARIS, [name]               ACCOUNT NO.:  [id number]   MEDICAL RECORD NO.:  [id number]          PATIENT TYPE:  AMB   LOCATION:  DAY                          FACILITY:  [facility]   PHYSICIAN:  Angelia Mould. Derrell Lolling, M.D.DATE OF BIRTH:  [birth year]   DATE OF PROCEDURE:  07/21/2007  DATE OF DISCHARGE:                               OPERATIVE REPORT   PREOPERATIVE DIAGNOSIS:  Carcinoma of the right breast.   POSTOPERATIVE DIAGNOSIS:  Carcinoma of the right breast.   OPERATION PERFORMED:  Insertion of X-PORT venous vascular access device  with fluoroscopic guidance.   SURGEON:  Angelia Mould. Derrell Lolling, M.D.   OPERATIVE INDICATIONS:  This is a 49 year old white female who was found  to have a right breast mass.  She has had mammograms and ultrasounds  which show a 3.2 cm mass in the upper portion of the right breast.  On  exam this is much larger being at least 6 cm; and on MRI it looks to be  8.5 cm x 3.7 cm.  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  She has seen Dr.  Ruthann Cancer.  He has decided to go ahead with neoadjuvant  chemotherapy; and requested that a port be placed.  I have discussed the  indications and details and risks of this procedure with the patient.  All of the questions were answered.  She agrees with this plan.  She is  brought to operating room electively.   OPERATIVE TECHNIQUE:  The patient was brought to the operating room, and  placed supine on the operating table with her arms at her sides; and a  very shallow cushion behind her shoulders.  She was monitored and  sedated by the anesthesia department.  Intravenous antibiotics were  given prior to the incision.  She was identified as to correct patient  and correct procedure.  The neck and chest was then prepped and draped  in a sterile fashion.  Then 1% Xylocaine with epinephrine was used as a  local infiltration anesthetic.   A left subclavian  venipuncture was performed, and a guidewire inserted  into the superior vena cava under fluoroscopic guidance.  A small  incision was made at the wire insertion site.  I made a transverse  incision about 2.5 cm below the left clavicle; and just lateral to the  midclavicular line.  Dissection was carried down through subcutaneous  tissue, exposing the pectoralis fascia.  A subcutaneous pocket was  created.  The catheter was drawn between the infraclavicular pocket, and  subclavicular wire insertion site.  Using fluoroscopic guidance I  measured a point on the chest wall that would allow the tip of the  catheter to be in the superior vena cava just above the right atrium.   The catheter was then carefully measured and cut.  The catheter was  secured to the port with the locking device; and the port and catheter  flushed with heparinized saline.  The port was sutured to the pectoralis  fascia with two interrupted sutures of 2-0 Prolene.  I could flush  the  catheter easily.  With the patient back in Trendelenburg position, I  inserted the dilator and peel-away sheath assembly over the guidewire  into the central venous circulation.  The dilator and wire were removed,  and the catheter inserted into the peel-away sheath and the peel-away  sheath removed.  The catheter flushed easily and had excellent blood  return.  The catheter was flushed with concentrated heparin.  Fluoroscopy was, once again, used; and the tip of the catheter appeared  to be exactly where I wanted it to be in the superior vena cava just  above the right atrium.  There was no kinking or deformity of the  catheter anywhere.  The subcutaneous tissue was closed with interrupted  sutures of 3-0 Vicryl; and the skin closed with  running subcuticular sutures of 4-0 Monocryl and Steri-Strips.  Clean  bandages were placed; and the patient taken to the recovery room in  stable condition.  Estimated blood loss was about 5-10 mL.   Complications none.  Sponge, needle, and instrument counts were correct.      Angelia Mould. Derrell Lolling, M.D.  Electronically Signed     HMI/MEDQ  D:  07/21/2007  T:  07/21/2007  Job:  [id number]   cc:   Valentino Hue. Magrinat, M.D.  Fax: [phone]   Ladona Horns. Mariel Sleet, MD  Fax: [phone]   Georgina Quint. Plotnikov, MD  [address]  [place]  [place] [address]

## 2011-03-23 NOTE — Op Note (Signed)
NAMEMAGGY, WYBLE               ACCOUNT NO.:  [id number]   MEDICAL RECORD NO.:  [id number]          PATIENT TYPE:  OIB   LOCATION:  5121                         FACILITY:  [facility]   PHYSICIAN:  Angelia Mould. Derrell Lolling, M.D.DATE OF BIRTH:  23-Jan-1961   DATE OF PROCEDURE:  10/30/2007  DATE OF DISCHARGE:                               OPERATIVE REPORT   PREOPERATIVE DIAGNOSIS:  1. Invasive mammary carcinoma, right breast, status post neoadjuvant      chemotherapy.  2. Status post Port-A-Cath insertion.  3. Patient desires prophylactic contralateral mastectomy.   POSTOPERATIVE DIAGNOSIS:  1. Invasive mammary carcinoma, right breast, status post neoadjuvant      chemotherapy.  2. Status post Port-A-Cath insertion.  3. Patient desires prophylactic contralateral mastectomy.   OPERATION PERFORMED:  1. Right modified radical mastectomy.  2. Left total mastectomy.  3. Removal of Port-A-Cath.   SURGEON:  Angelia Mould. Derrell Lolling, M.D.   FIRST ASSISTANT:  Gabrielle Dare. Janee Morn, M.D.   OPERATIVE INDICATIONS:  This is a 50 year old white female who was  initially evaluated 3 months ago when she was diagnosed with cancer of  the right breast.  She felt some discomfort and a mass in the upper  inner quadrant of the right breast.  She had ultrasounds and mammograms  and MRI and a core biopsy.  Core biopsy revealed invasive mammary  carcinoma which was estrogen receptor positive, progesterone receptor  positive, HER2/neu negative.  Her MRI showed that there was an area of  extensive enhancement medially in the right breast measuring 8.5 x 3.7  cm, although on the ultrasound the mass was only 3.2 cm.  She was  referred for neoadjuvant chemotherapy, and did receive that under the  guidance of Dr. Glenford Peers.  Clinically she has had a fairly good  response, although still has a fairly significant area of enhancement.   Dr. Dayton Scrape, Dr. Mariel Sleet and I, all feel that the patient will need a  mastectomy  because of the large area of involvement.  Dr. Dayton Scrape plans  to give postop radiation therapy because of the large area of tumor.  Dr. Mariel Sleet and I feel that she should undergo axillary lymph node  dissection because of the large area, and the high risk for axillary  lymph node metastasis.  The patient has a strong family history for  breast cancer.  She desires a contralateral prophylactic mastectomy.  Dr. Mariel Sleet said that it was okay to remove her Port-A-Cath at this  time.  She has been counseled regarding all this, and is brought to the  operating room electively.   OPERATIVE TECHNIQUE:  Following the induction of general endotracheal  anesthesia, the patient received intravenous antibiotics.  The patient  was identified as to correct patient, correct procedure, and correct  site.  The neck, chest, breast, and upper abdomen were then prepped and  draped in a sterile fashion.   I spent some time, marking the midline and the inframammary crease as  well as a symmetrical transverse elliptical incisions.   I performed the surgery on the right side first.  A transverse  elliptical  incision was created removing the nipple and areola complex  as well as the previous percutaneous biopsy site.  Skin flaps were  raised medially to the parasternal area, superiorly to just below the  clavicle, inferiorly to the anterior rectus sheath, and laterally to the  latissimus dorsi muscle.  The breast was then dissected off of the  pectoralis major muscle with electrocautery leaving the pectoralis major  muscle intact.  We continued the dissection laterally, dissecting the  breast away from the lateral border of the pectoralis major and the  pectoralis minor muscles.  We then incised the clavipectoral fascia and  entered the axilla.  Small vascular channels and small sensory nerves  were controlled with metal clips and divided.  We took the dissection  cephalad until we identified the axillary  vein.  Small tributaries to  the axillary vein were controlled with metal clips and divided.   The thoracodorsal neurovascular bundle was identified and preserved.  We  dissected all of the axillary contents out below the axillary vein, and  removed them en bloc with the breast specimen.  I marked the upper inner  quadrant of the breast specimen with a silk suture.  Hemostasis was  excellent and achieved with metal clips and electrocautery.  One artery  in the axilla was tied off with a 2-0 silk tie.  The wound was then  packed with sterile gauze.   I turned my attention to the left side.  I made a mirror image  transverse elliptical incision removing the nipple and areolar complex.  Skin flaps were raised medially, superiorly, laterally, and inferiorly  in a similar fashion, and the breast was dissected off the pectoralis  major muscle in a similar fashion.  I continued the dissection  laterally, dissecting the breast and the tail of Spence off-and-away  from the chest wall and then divided the tail of Spence leaving the  axillary contents mostly intact.  The specimen was also marked with a  silk suture to mark the upper inner quadrant.  I dissected cephalad  under the upper left sided flap until I encountered the port. This was  dissected away from the subcutaneous tissue, the proline sutures cut and  the port and catheter removed intact. There was no bleeding or heematoma  at this site. This wound was irrigated with saline.  Hemostasis was  excellent, and had been achieved mostly with electrocautery, and a few  metal clips.   On the right side I placed two 19-French Blake drains, one up into the  axilla, and one across the skin flaps.  These were brought out through  separate stab incisions inferolaterally, sutured to the skin with nylon  sutures, and connected to the suction bulb.  On the left side we placed  a single drain across the skin flaps, and brought that out   inferolaterally and sutured it to the skin in a similar fashion with  nylon suture, and connected it to a suction bulb.  The skin incisions  were closed with skin staples.  I placed Adaptic gauze, 4x4 gauze, ABDs  and two 6-inch Ace wraps around to provide some pressure.  The patient  tolerated the procedure well and was taken to recovery room in stable  condition.  Estimated blood loss was about 150 mL.  Complications none.  Sponge, needle, and instrument counts were correct.      Angelia Mould. Derrell Lolling, M.D.  Electronically Signed     HMI/MEDQ  D:  10/30/2007  T:  10/31/2007  Job:  [id number]   cc:   Ladona Horns. Mariel Sleet, MD  Maryln Gottron, M.D.  Georgina Quint. Plotnikov, MD  Juluis Mire, M.D.

## 2011-03-23 NOTE — Discharge Summary (Signed)
[name], LINAREZ               ACCOUNT NO.:  [id number]   MEDICAL RECORD NO.:  [id number]          PATIENT TYPE:  IPS   LOCATION:  0300                          FACILITY:  [facility]   PHYSICIAN:  Jasmine Pang, M.D. DATE OF BIRTH:  [birth year]   DATE OF ADMISSION:  12/02/2008  DATE OF DISCHARGE:  12/06/2008                               DISCHARGE SUMMARY   IDENTIFYING INFORMATION:  This is a 50 year old married white female  admitted on a voluntary basis on December 02, 2008.   HISTORY OF PRESENT ILLNESS:  The patient presents with a history of  depression and suicidal thoughts.  She has no specific plans to harm  herself.  She has been reporting that her pain level is out of control.  Her medications are not working.  The patient reports having breast  cancer.  She received a news on Friday that her sleep medication would  interact with her breast cancer medication and made her very upset.  She  stopped using her medication, once having difficulty sleeping and  feeling very hopeless.  She is returned back to work and feeling  somewhat overwhelmed.   PAST PSYCHIATRIC HISTORY:  This is the first admission to [facility].  She has had no other inpatient psychiatric admission.  She has had no current outpatient mental health therapy.   FAMILY HISTORY:  None.   ALCOHOL AND DRUG HISTORY:  She denies any alcohol or drug use.   MEDICAL PROBLEMS:  History of breast cancer, bilateral mastectomy and  breast reconstruction.  Her breast reconstruction was done on June 06, 2008, history of fibromyalgia.   MEDICATIONS:  1. Lexapro 20 mg daily.  2. Ultram 50 mg two b.i.d.  3. Xanax 0.5 mg b.i.d.  4. Lidocaine patches as needed at night.  5. Neurontin 300 mg p.o. t.i.d.  6. She is currently off her tamoxifen for a 6-week period.   DRUG ALLERGIES:  SULFA.   PHYSICAL FINDINGS:  There were no acute physical or medical problems.  She was fully assessed at the [facility]  ED.   LABORATORY DATA:  Alcohol level was less than 5.  Urine drug screen was  positive for benzodiazepines and positive for opiates.   HOSPITAL COURSE:  Upon admission, the patient was started on trazodone  50 mg p.o. q.h.s. p.r.n. and Lexapro 20 mg p.o. q.day and Xanax 0.5 mg  p.o. b.i.d., lidocaine patch 5% as needed at night, Neurontin 300 mg  three times daily.  She was given Vicodin 5/325 mg 1 to 2 p.o. every 6  hours p.r.n. for pain related to the breast reconstructions, and one-  time dose of Dilaudid 2 mg.  On December 03, 2008, Vicodin was  discontinued.  Trazodone was discontinued.  Xanax was discontinued.  She  was started on Xanax 0.5 mg p.o. b.i.d. and at h.s., Neurontin 300 mg  p.o. q.h.s. in addition to 300 mg p.o. t.i.d.  Ambien was discontinued.  She was placed on Restoril 15 mg p.o. q.h.s.  Ultram was started at 50  mg 2 tablets p.o. q.6 h. p.r.n.  pain.  She was also allowed to use her  multivitamin from home daily.  In individual sessions with me, the  patient was somewhat reserved, but cooperative.  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  She stated she had  begun to feel totally desperate.  She states she cannot get past the  pain and this was exacerbated by going to work.  She had been on leave  from there for a while, but has decided to come back at 30 hours a week  to attempt to work.  She was quite depressed and anxious.  Affect  consistent with mood.  On December 04, 2008, sleep was fair.  Appetite  fair.  She was less depressed and less anxious.  She still complained of  pain, but less so today.  She stated she wants to go back on disability.  She had been on this for the period of time surrounding her surgery and  thought she would be able to go back to work.  She now feels that was  too soon.  On December 05, 2008, sleep was good.  Appetite was good.  The  patient wanted to have a family session with her husband.  This was   arranged on December 06, 2008.  On December 06, 2008, her mood was less  depressed, less anxious.  A family session with her husband went well.  He is very supportive of her.  Affect was consistent with mood.  There  was no suicidal or homicidal ideation.  No thoughts of self-injurious  behavior.  No auditory or visual hallucinations.  No paranoia or  delusions.  Thoughts were logical and goal-directed.  Thought content no  predominant theme.  Cognitive was grossly intact.  Insight good.  Judgment good.  Impulse control good.  She was felt to be safe for  discharge today.   DISCHARGE DIAGNOSES:  Axis I:  Major depressive disorder, single  episode, severe without psychosis.  Axis II:  None.  Axis III:  History of breast cancer with bilateral mastectomy,  fibromyalgia.  Axis IV: Severe (psychosocial problems, occupational problems, medical  problems, burden of psychiatric illness).  Axis V:  Global assessment of functioning was 50 at discharge.  GAF was  30 to 35 upon admission.  GAF highest past year was 75.   DISCHARGE PLANS:  There was no specific activity level or dietary  restrictions.   POSTHOSPITAL CARE PLANS:  The patient will work with Dr. Noe Gens,  counselor at the cancer center.  Dr. Noe Gens is going to refer her to a  psychiatrist.  She was also go to the center for pain and rehabilitation  medicine.  Her doctor, Dr. Corliss Skains was written a letter indicating the  patient did want to go to a pain clinic for her pain management.  Dr.  Corliss Skains had told us earlier that she felt that the patient should go  on since she was requesting something more than her current pain  medications.   DISCHARGE MEDICATIONS:  1. Lexapro 20 mg daily.  2. Neurontin 300 mg four times daily.  3. Xanax 0.5 mg twice a day and at bedtime.  4. Restoril 15 mg at bedtime for sleep.  5. Ultram 50 mg two p.o. 3 times a day as needed p.r.n. pain.  6. Lidocaine patch as needed.  7. Multivitamin daily.    She is also to contact her oncologist to talk with him about whether to  restart  her tamoxifen.      Jasmine Pang, M.D.  Electronically Signed     BHS/MEDQ  D:  12/07/2008  T:  12/08/2008  Job:  [id number]

## 2011-03-23 NOTE — Consult Note (Signed)
[name], [name] NO.:  [id number]   MEDICAL RECORD NO.:  [id number]          PATIENT TYPE:  EMS   LOCATION:  ED                           FACILITY:  [facility]   PHYSICIAN:  Antonietta Breach, M.D.  DATE OF BIRTH:  11-05-1961   DATE OF CONSULTATION:  12/02/2008  DATE OF DISCHARGE:                                 CONSULTATION   REFERRING PHYSICIAN:  [facility].   REASON FOR CONSULTATION:  Severe depression, anxiety, suicidal thoughts.   HISTORY OF PRESENT ILLNESS:  [name] is a 50 year old female  presenting to the [facility] with severe depression and  anxiety.   She has been experiencing approximately 4 weeks of progressive depressed  mood for concentrating, for sleep, anhedonia, poor energy and suicidal  thoughts.  She acutely had been screaming secondary to her symptoms.  She has also been having trouble with memory.   She has severe feeling on edge and muscle tension.   She has been having plans for an overdose with the passage of time and  support from her husband, as well as staff these symptoms have not  improved.   She does have intact orientation.  She does not have hallucinations or  delusions.   PAST PSYCHIATRIC HISTORY:  The she underwent counseling several years  ago in [place].  She was undergoing counseling for depression related to  chronic pain.   She has been taking Lexapro 20 mg daily.  She was taking trazodone  regularly.  However, she stopped it several months ago concerned that it  would undermine her breast cancer prevention by interfering with the  mechanism of tamoxifen.   She does not have history of suicide attempts.  She has not been having  thoughts of harming others.   FAMILY PSYCHIATRIC HISTORY:  None known.   SOCIAL HISTORY:  She has a very supportive spouse.  Her primary care  physician is Georgina Quint. Plotnikov, MD.  She is currently medically  disabled.  She does not use illegal  drugs.  She has been drinking wine  daily for several years utilizing one glass per day.  She is not  displaying any alcohol withdrawal objective signs.  She does have severe  anxiety symptoms as mentioned above.   PAST MEDICAL HISTORY:  1. Breast cancer survivor.  2. Fibromyalgia.  3. She has been treated with ongoing tamoxifen therapy.   ALLERGIES:  SULFA.   MEDICATIONS:  The MAR was reviewed.  1. She has received oxycodone 5 mg with acetaminophen 325 mg.  2. She has also received Dilaudid 1 mg.   She has been in the emergency room over 20 hours.   LABORATORY DATA:  Her urine drug screen was positive for opiates,  positive for benzodiazepines.  Alcohol level was negative.  WBC 5,  hemoglobin 13.4, platelet count 234.  Sodium 141, potassium 3.7, glucose  124, BUN 9, creatinine 0.74.  EKG: QTC 448 milliseconds.   REVIEW OF SYSTEMS:  CONSTITUTIONAL, HEAD, EYES, EARS, NOSE, THROAT,  MOUTH, NEUROLOGIC, PSYCHIATRIC, CARDIOVASCULAR, RESPIRATORY,  GASTROINTESTINAL, GENITOURINARY, SKIN, MUSCULOSKELETAL, HEMATOLOGIC,  LYMPHATIC, ENDOCRINE, METABOLIC:  All unremarkable.   PHYSICAL EXAMINATION:  VITAL SIGNS:  Temperature 97.6, pulse 87,  respiratory rate 20, blood pressure 106/71, O2 saturation on room air  100%.  GENERAL APPEARANCE:  [name] is a middle-aged female partially  reclined in a supine position in her hospital bed with no abnormal  involuntary movements.   MENTAL STATUS EXAM:  Her affect is clearly anxious.  Her mood is  depressed.  Her attention span is slightly decreased, concentration  mildly decreased.  She is oriented to all spheres.  Her memory is intact  to immediate, recent and remote.  Her eye contact is intact.  Her fund  of knowledge and intelligence are normal.  Her speech involves a slight  pressure at times.  There is no dysarthria.  Thought process logical,  coherent, goal-directed.  No looseness of associations.  Thought  content, there is suicidal  intent I am at my wits.  There are no  hallucinations or delusions.  Insight is intact for her symptoms.  Judgment is intact for informed consent for psychiatric care.   The patient requested that her husband be present to facilitate  education and support.   ASSESSMENT:  AXIS I:  293.83.  1.  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  293.84.  2.  Anxiety disorder not otherwise specified.  296.33.  3.  Major depressive disorder recurrent, severe.  Possible alcohol abuse.  AXIS II:  Deferred.  AXIS III:  See past medical history.  AXIS IV:  General medical.  Axis V:  30.   [name] is at risk to harm herself.   The undersigned provided ego supportive psychotherapy and education.   The indications, alternatives and adverse effects of Vistaril were  discussed with the patient for anti-acute anxiety, as well as anti-  insomnia.  She understands and would like to proceed.   RECOMMENDATIONS:  1. Would keep Ativan as a p.r.n. back up in case presenting alcohol      withdrawal 1-2 mg p.o. IM or IV q.4h. p.r.n.  2. Would utilize Vistaril 25-50 mg p.o. t.i.d. p.r.n. anxiety or      nocturnal insomnia.  3. Would use caution about utilizing Vistaril with narcotic agents      regarding sedation and ataxia, risk of falls.  4. Would admit to an inpatient psychiatric unit as soon as possible.  5. Continue ego support and suicide precautions.      Antonietta Breach, M.D.  Electronically Signed     JW/MEDQ  D:  12/02/2008  T:  12/02/2008  Job:  [id number]

## 2011-03-23 NOTE — Op Note (Signed)
[name], YALE               ACCOUNT NO.:  [id number]   MEDICAL RECORD NO.:  [id number]          PATIENT TYPE:  AMB   LOCATION:  DSC                          FACILITY:  [facility]   PHYSICIAN:  Cindee Salt, M.D.       DATE OF BIRTH:  September 26, 1961   DATE OF PROCEDURE:  05/09/2009  DATE OF DISCHARGE:                               OPERATIVE REPORT   PREOPERATIVE DIAGNOSES:  1. Carpal tunnel syndrome, left hand.  2. Flexor sheath cyst, left index finger.   POSTOPERATIVE DIAGNOSES:  1. Carpal tunnel syndrome, left hand.  2. Flexor sheath cyst, left index finger.   OPERATION:  Left carpal tunnel release with excision of flexor sheath  cyst, left index finger.   SURGEON:  Cindee Salt, MD   ASSISTANT:  Carolyne Fiscal, RN   ANESTHESIA:  General.   ANESTHESIOLOGIST:  Janetta Hora. Frederick, MD   HISTORY:  The patient is a 50 year old female with a history of a large  mass on the volar aspect of her metacarpophalangeal joint, left index  finger.  She also has numbness and tingling indicative of carpal tunnel  syndrome.  Physical exam and nerve conductions confirm carpal tunnel  syndrome, left hand.  She is desirous having the mass removed along with  release of the carpal canal and that the conservative treatment has not  entirely resolved this for her.  Pre, peri, postoperative course have  been discussed along with the risks and complications.  She is aware  that there is no guarantee with the surgery; possibility of infection;  recurrence; injury to arteries, nerves, tendons; incomplete relief of  symptoms or dystrophy.  In preoperative area, the patient is seen, the  extremity marked by both the patient and surgeon, antibiotic given.   PROCEDURE:  The patient was brought to the operating room where a  general anesthetic was carried out without difficulty under the  direction of Dr. Gelene Mink.  She was prepped using ChloraPrep in supine  position with left arm free.  A 3-minute dry time was  taken.  A time-out  taken confirming the patient and procedure.  Following this, she was  draped.  A longitudinal incision was made in the palm and carried down  through the subcutaneous tissue.  Bleeders were electrocauterized,  palmar fascia was split, superficial palmar arch identified, the flexor  tendon of the ring and little finger identified to the ulnar side of  median nerve, and carpal retinaculum was incised with sharp dissection.  A  right angle Sewall retractor was placed between the skin and forearm  fascia.  The fascia released for approximately a 1.5 cm proximal to the  wrist crease under direct vision.  The canal was explored.  No further  lesions were identified.  Area compression to the nerve was apparent.  A  separate incision was then made obliquely over the flexor tendon of the  left index finger, carried down through the subcutaneous tissue.  A  large cystic mass was immediately encountered.  Neurovascular structures  identified and protected.  With blunt and sharp dissection, the cyst was  excised and  sent to pathology.  An area of exit from the flexor sheath  was opened.  The wounds were copiously irrigated with saline.  The skin  closed with interrupted 5-0 Vicryl Rapide sutures.  A local infiltration  to each incision was made with 0.25% Marcaine without epinephrine and  approximately 4.5 mL was used.  A sterile compressive dressing and  splint to the wrist was applied.  Fingers freed.  The patient tolerated  the procedure well, was taken to the recovery room for observation in  satisfactory condition.  At deflation of the tourniquet, all fingers  immediately pinked.   DISCHARGE SUMMARY:  She is discharged home, to return to the [facility] [place] in 1 week, on Vicodin.           ______________________________  Cindee Salt, M.D.     GK/MEDQ  D:  05/09/2009  T:  05/10/2009  Job:  [id number]   cc:   Georgina Quint. Plotnikov, MD

## 2011-03-26 NOTE — Op Note (Signed)
[name], [name]               ACCOUNT NO.:  [id number]   MEDICAL RECORD NO.:  [id number]          PATIENT TYPE:  AMB   LOCATION:  SDC                           FACILITY:  [facility]   PHYSICIAN:  Juluis Mire, M.D.   DATE OF BIRTH:  1961/04/12   DATE OF PROCEDURE:  02/11/2005  DATE OF DISCHARGE:                                 OPERATIVE REPORT   PREOPERATIVE DIAGNOSIS:  Adenomyosis.   POSTOPERATIVE DIAGNOSIS:  Adenomyosis with moderate pelvic endometriosis.   OPERATION/PROCEDURE:  Laparoscopic-assisted vaginal hysterectomy.   SURGEON:  Dr. Arelia Sneddon.   ASSISTANT:  Dr. Henderson Cloud.   ESTIMATED BLOOD LOSS:  300-400 cc.   PACKS AND DRAINS:  None.   INTRAOPERATIVE BLOOD REPLACEMENT:  None.   COMPLICATIONS:  None.   INDICATIONS:  Noted in the history and physical.   PROCEDURE:  The patient was taken to the OR and placed in the supine  position.  After a satisfactory level of general endotracheal anesthesia was  obtained, the patient was placed in the dorsal lithotomy position using the  Allis stirrups.  The abdomen, peroneum and vagina were prepped out with  Betadine.  The bladder was emptied by in and out catheterization.  A Hulka  tenaculum was put in place and secured.  A subumbilical incision was made  with the knife.  The incision was extended through the subcutaneous tissue  to the fascia.  The fascia was entered sharply and we extended the fascia  laterally.  The rectus muscles were separated.  The peritoneum was entered  bluntly.  Upon opening, a laparoscopic trocar was put in place and secured.  The laparoscope was introduced.  The abdomen was insufflated with carbon  dioxide.  Visualization revealed no evidence of injury to adjacent organs.  The appendix and upper abdomen including liver and tip of the gallbladder  were clear.  Both ovaries were unremarkable.  The bilateral tubal ligation  was noted.  In the cul-de-sac area, there were several large implants of  endometriosis along the left uterosacral ligament, right uterosacral  ligament, and left pelvic side wall.  At this point in time, using the Gyrus  bipolar, the right utero-ovarian pedicle was cauterized, incised and the  right round ligament was cauterized and incised.  Next, the left utero-  ovarian pedicle was cauterized, incised and a left round ligament was  cauterized, incised.  We had a good separation of the adnexa to the broad  ligament.  No active bleeding or signs of complications were encountered.  The visualization of the ureters was noted in the pelvic side wall and  uninvolved in the operative procedure.   The abdomen was desufflated with carbon dioxide.  The laparoscope was  removed.  The patient's legs were repositioned.  The Hulka tenaculum was  then removed.  A weighted speculum was placed in the vaginal vault.  The  cervix was grasped with Jake's tenaculum.  The cul-de-sac was entered  sharply.  The uterosacral ligaments were clamped, cut and suture ligated  with 0 Vicryl with reflection of the vaginal mucosa anteriorly.  It was  incised.  The bladder was dissected superiorly.  Paracervical tissue was  clamped, cut and suture ligated with 0 Vicryl.  The vesicouterine space was  identified, entered sharply and a retractor was put in place.  Next, using  the clamp, cut and tie technique with suture ligatures of 0 Vicryl, the  parametrium was serially separated from the sides of the uterus.  The uterus  was then flipped.  The held pedicles were clamped and cut.  The uterus was  passed off the operative field.  The held pedicles were secured with free  ties of 0 Vicryl.  Areas of bleeding were brought under control with figure-  of-eights of 0 Vicryl.  The uterosacral plication stitch of 0 Vicryl was put  in place and secured.  The vaginal mucosa was reapproximated in a vertical  fashion with figure-of-eights of 0 Vicryl.  A sponge stick was placed in the  vaginal vault.   The Foley was placed to straight drain which achieved an  adequate amount of clear urine.  A weighted speculum was then removed.  The  patient's legs were repositioned.  The abdomen was reinflated with carbon  dioxide.  The laparoscope was reintroduced.  We irrigated the pelvis.  Areas  of oozing on the vaginal cuff brought under control using the Gyrus.  At  this point, we had good hemostasis at the cuff and both ovarian pedicles.  We deflated the abdomen.  All trocars were removed.  The subumbilical fascia  was closed with two figure-of-eights of 0 Vicryl, the skin with interrupted  sutures, the subcuticular with 4-0 Vicryl, the suprapubic incision closed  with Dermabond.  Urine output remained clear and adequate.  Sponge,  instrument and needle count was reported as correct by the circulating nurse  x2.  The patient, once extubated, transferred to the recovery room in good  condition.      JSM/MEDQ  D:  02/11/2005  T:  02/11/2005  Job:  [id number]

## 2011-03-26 NOTE — H&P (Signed)
NAME:  [name], [name] NO.:  [id number]   MEDICAL RECORD NO.:  [id number]           PATIENT TYPE:   LOCATION:                                 FACILITY:   PHYSICIAN:  Juluis Mire, M.D.        DATE OF BIRTH:   DATE OF ADMISSION:  DATE OF DISCHARGE:                                HISTORY & PHYSICAL   The patient is a 50 year old gravida 3, para 3, married white female, who  presents for laparoscopically-assisted vaginal hysterectomy.   In relation to the present admission, the patient has been having trouble  with what sounded like at first dysfunctional uterine bleeding over the past  several years.  She has undergone several saline infusion ultrasounds with  findings basically of a small intramural fibroid as well as thickened  endometrium highly suggestive of adenomyosis.  For a short time we placed  her on birth control pills with fairly decent response.  This had to be  discontinued due to tobacco use, and we tried cycling her with Aygestin and  were unable to control her bleeding pattern or pelvic pain.  Because of  this, alternatives were discussed.  She has decided to proceed with  laparoscopically-assisted vaginal hysterectomy for management of symptomatic  uterine adenomyosis.   It is also of note, she has had a previous bilateral tubal ligation.  There  was no mention of endometriosis at that time.  She has also in the past had  a hysteroscopic resection of a polyp that was evidently benign.  She has  also had a previous LEEP of the cervix with finding of moderate cervical  dysplasia.  Since that time, Pap smears have been basically negative.   In terms of allergies, she is allergic to SULFA.   MEDICATIONS:  Lexapro, trazodone and Ultram.   PAST MEDICAL HISTORY:  She had the usual childhood diseases.  Is being  actively managed for fibromyalgia at the present time.   PAST SURGICAL HISTORY:  As noted, she had a previous bilateral tubal  ligation.   She had a previous hysteroscopy.  She had a previous LEEP and a  finding of severe cervical dysplasia.  She has also had a ganglion cyst  removed from both wrists and has had a previous D&C.   PAST OBSTETRICAL HISTORY:  She had three vaginal deliveries.   FAMILY HISTORY:  Noncontributory.   SOCIAL HISTORY:  Tobacco use.  She smokes a half-pack per day.  She has  occasional alcohol use.   REVIEW OF SYSTEMS:  Noncontributory.   PHYSICAL EXAMINATION:  VITAL SIGNS:  The patient is afebrile with stable  vital signs.  HEENT:  Patient normocephalic.  Pupils equal, round, and reactive to light  and accommodation.  Extraocular movements are intact.  Sclerae and  conjunctivae are clear.  Oropharynx clear.  NECK:  Without thyromegaly.  BREASTS:  No discrete masses.  CHEST:  Lungs clear.  CARDIAC:  Regular rate without murmurs or gallops.  ABDOMEN:  Benign.  No mass, organomegaly or tenderness.  PELVIC:  Normal external genitalia.  Vaginal mucosa is  clear.  Cervix  unremarkable.  Cervix normal size, shape and contour.  Slightly boggy and  tender.  Adnexa unremarkable.  Rectovaginal exam is clear.  EXTREMITIES:  Trace edema.  NEUROLOGIC:  Grossly within normal limits.   IMPRESSION:  1.  Adenomyosis leading to abnormal bleeding.  2.  Uterine fibroids.  3.  History of cervical dysplasia.  4.  Fibromyalgia.   PLAN:  The patient will undergo laparoscopically-assisted vaginal  hysterectomy.  Alternatives have been discussed, such as Depo-Provera or  possible NovaSure ablation.  Due to menstrual irregularity, however, this  would probably be contraindicated.  We also discussed the use of the Mirena  IUD.  Risks of surgery have been discussed, including the risk of infection;  the risk of hemorrhage that could require transfusion or risk of AIDS or  hepatitis; risk of injury to adjacent organs including bladder, bowel or  ureters that could require further exploratory surgery; risk of deep  venous  thrombosis and pulmonary embolus as well as anesthetic complications.  The  patient expressed an understanding of indications, risks and options.      JSM/MEDQ  D:  02/11/2005  T:  02/11/2005  Job:  [id number]

## 2011-03-26 NOTE — Discharge Summary (Signed)
NAMEJACYLN, [name]               ACCOUNT NO.:  [id number]   MEDICAL RECORD NO.:  [id number]          PATIENT TYPE:  OBV   LOCATION:  9304                          FACILITY:  [facility]   PHYSICIAN:  Juluis Mire, M.D.   DATE OF BIRTH:  03-23-1961   DATE OF ADMISSION:  02/11/2005  DATE OF DISCHARGE:                                 DISCHARGE SUMMARY   ADMITTING DIAGNOSIS:  Adenomyosis.   DISCHARGE DIAGNOSES:  1.  Adenomyosis.  2.  Endometriosis.   OPERATIVE PROCEDURE:  1.  Laparoscopic-assisted vaginal hysterectomy.  2.  Suturing of posterior vaginal tear due to postoperative bleeding.   For complete history and physical please see dictated note.   COURSE IN THE HOSPITAL:  The patient underwent laparoscopic-assisted vaginal  hysterectomy. There were signs of pelvic endometriosis noted in the  operative note. At approximately 1 o'clock she had increasing vaginal  bleeding on the third floor. Evaluation did reveal a fair amount of clotted  material and free blood. No signs of active bleeding were noted while  examined on the floor. She was taken to the OR. General anesthesia was  reinstituted. Exam under anesthesia revealed actually a posterior wall tear.  This may have come from the weighted speculum. It was subsequently repaired  and no further bleeding was encountered. She did well with this. Her  postoperative hemoglobin was 8.8 which would be consistent with her blood  loss pattern. She is discharged home on her postoperative day #1. At that  time she was tolerating a regular diet and ambulating without difficulty.  She had just had her Foley removed. She was actually passing flatus. Her  abdomen was soft, nontender, bowel sounds were active, incision was clear,  and she was having no active vaginal bleeding.   COMPLICATIONS:  Noted above. The patient discharged home in stable  condition.   DISPOSITION:  The patient is to avoid vaginal entrance or driving a car or  heavy  lifting. She is to call with increasing vaginal bleeding, fever,  nausea, vomiting, or increasing abdominal pain. Also discussed symptoms and  signs of phlebitis. Discharged home on Tylox as needed for pain, iron  sulfate supplementation, and reassessment in the office in 1 week.      JSM/MEDQ  D:  02/12/2005  T:  02/12/2005  Job:  [id number]

## 2011-03-26 NOTE — Op Note (Signed)
NAMEISBELLA, ARLINE               ACCOUNT NO.:  [id number]   MEDICAL RECORD NO.:  [id number]          PATIENT TYPE:  OBV   LOCATION:  9304                          FACILITY:  [facility]   PHYSICIAN:  Juluis Mire, M.D.   DATE OF BIRTH:  Mar 29, 1961   DATE OF PROCEDURE:  02/11/2005  DATE OF DISCHARGE:                                 OPERATIVE REPORT   PREOPERATIVE DIAGNOSIS:  Postoperative, laparoscopically-assisted vaginal  hysterectomy, with heavy vaginal bleeding.  Possible bleeding from the  vaginal cuff.   POSTOPERATIVE DIAGNOSIS:  Actually a posterior wall vaginal laceration.   OPERATIVE PROCEDURE:  Examination under anesthesia with suturing of  posterior vaginal wall laceration.   SURGEON:  Juluis Mire, M.D.   ANESTHESIA:  General endotracheal.   ESTIMATED BLOOD LOSS:  Estimated blood loss with the procedure was minimal.   PACKS AND DRAINS:  None.   INTRAOPERATIVE BLOOD REPLACED:  None.   COMPLICATIONS:  None.   INDICATIONS:  The patient is a 50 year old female who underwent  laparoscopically-assisted vaginal hysterectomy this morning.  On visitation  to the patient midday, it was noted that she had had a gush of blood in the  bed and continued to have trickling of blood.  A speculum was placed in the  vaginal vault.  We could not visualize any active bleeding at this point and  thought it must have been coming from the cuff.  The decision was to bring  her down and do an exam under anesthesia, possible diagnostic laparoscopy,  possible exploratory laparotomy.  The risks were again discussed.  As in any  operative field, the risk of infection; the risk or hemorrhage that could  require transfusion with the risk of AIDS or hepatitis.  The risk of injury  to adjacent organs that could require further exploratory surgery; the risk  of deep venous thrombosis and pulmonary embolus.   The procedure was as follows:  The patient was taken in the OR and placed in  the supine  position.  After satisfactory level of general anesthesia  obtained, the patient was placed in the dorsal lithotomy position using the  Allen stirrups.  The perineum and vagina were cleansed out with Betadine.  The patient draped in a sterile field.  A speculum was placed in the vaginal  vault.  There was no active bleeding coming from the top of the vagina.  As  we inspected the vagina, there was a posterior wall vaginal laceration that  was approximately three inches in length, and this was the site of active  bleeding.  We did a rectal exam.  There was no sign of entrance into the  rectum or sigmoid.  Using 0 Vicryl, the laceration was repaired.  There was  a smaller laceration beside that that was also repaired.  After this, there  was absolutely no bleeding whatsoever.  We visualized the vaginal cuff.  It  was very dry.  There was no bleeding from the posterior vaginal wall.  Repeat rectal exam revealed no sutures in the rectum.  Exam under anesthesia  revealed again the cuff  intact.  There was no sign of any pelvic cuff  fullness such as a hematoma, so we felt like this was a posterior vaginal  wall laceration.  I do not know whether this was done with the weighted  speculum, but that is certainly a possibility, or when we removed it.  At  this point in time, the sponge, instrument and needle count were reported as  correct by the circulating nurse.  The patient was taken out of the dorsal  lithotomy position once alert and extubated, transferred to the recovery  room in good condition.      JSM/MEDQ  D:  02/11/2005  T:  02/11/2005  Job:  [id number]

## 2011-04-06 ENCOUNTER — Other Ambulatory Visit: Payer: Self-pay | Admitting: Otolaryngology

## 2011-04-06 DIAGNOSIS — H9120 Sudden idiopathic hearing loss, unspecified ear: Secondary | ICD-10-CM

## 2011-04-09 ENCOUNTER — Ambulatory Visit
Admission: RE | Admit: 2011-04-09 | Discharge: 2011-04-09 | Disposition: A | Discharge status: Home / Self Care | Payer: BC Managed Care – PPO | Source: Ambulatory Visit | Attending: Otolaryngology | Admitting: Otolaryngology

## 2011-04-09 DIAGNOSIS — H9120 Sudden idiopathic hearing loss, unspecified ear: Secondary | ICD-10-CM

## 2011-04-28 ENCOUNTER — Emergency Department (HOSPITAL_COMMUNITY): Payer: BC Managed Care – PPO

## 2011-04-28 ENCOUNTER — Emergency Department (HOSPITAL_COMMUNITY)
Admission: EM | Admit: 2011-04-28 | Discharge: 2011-04-29 | Disposition: A | Discharge status: Home / Self Care | Payer: BC Managed Care – PPO | Attending: Emergency Medicine | Admitting: Emergency Medicine

## 2011-04-28 DIAGNOSIS — R300 Dysuria: Secondary | ICD-10-CM | POA: Insufficient documentation

## 2011-04-28 DIAGNOSIS — Z853 Personal history of malignant neoplasm of breast: Secondary | ICD-10-CM | POA: Insufficient documentation

## 2011-04-28 DIAGNOSIS — R10819 Abdominal tenderness, unspecified site: Secondary | ICD-10-CM | POA: Insufficient documentation

## 2011-04-28 DIAGNOSIS — F17200 Nicotine dependence, unspecified, uncomplicated: Secondary | ICD-10-CM | POA: Insufficient documentation

## 2011-04-28 DIAGNOSIS — K5900 Constipation, unspecified: Secondary | ICD-10-CM | POA: Insufficient documentation

## 2011-04-28 LAB — CBC
MCHC: 32.9 g/dL (ref 30.0–36.0)
Platelets: 192 10*3/uL (ref 150–400)
RDW: 13.7 % (ref 11.5–15.5)
WBC: 5.7 10*3/uL (ref 4.0–10.5)

## 2011-04-28 LAB — POCT I-STAT, CHEM 8
Calcium, Ion: 1.09 mmol/L — ABNORMAL LOW (ref 1.12–1.32)
Glucose, Bld: 117 mg/dL — ABNORMAL HIGH (ref 70–99)
HCT: 38 % (ref 36.0–46.0)
Hemoglobin: 12.9 g/dL (ref 12.0–15.0)
Potassium: 3.4 mEq/L — ABNORMAL LOW (ref 3.5–5.1)
TCO2: 25 mmol/L (ref 0–100)

## 2011-04-28 LAB — URINALYSIS, ROUTINE W REFLEX MICROSCOPIC
Nitrite: NEGATIVE
Specific Gravity, Urine: 1.012 (ref 1.005–1.030)
pH: 6.5 (ref 5.0–8.0)

## 2011-04-28 LAB — DIFFERENTIAL
Basophils Absolute: 0 10*3/uL (ref 0.0–0.1)
Basophils Relative: 1 % (ref 0–1)
Eosinophils Absolute: 0.3 10*3/uL (ref 0.0–0.7)
Eosinophils Relative: 5 % (ref 0–5)

## 2011-04-28 LAB — URINE MICROSCOPIC-ADD ON

## 2011-07-30 LAB — CBC
HCT: 37.9
Hemoglobin: 12.9
MCHC: 34.1
MCV: 84.6
RBC: 4.48
RDW: 11.9

## 2011-07-30 LAB — DIFFERENTIAL
Basophils Absolute: 0
Basophils Relative: 0
Eosinophils Absolute: 0.1
Eosinophils Relative: 3
Monocytes Absolute: 0.4
Monocytes Relative: 14 — ABNORMAL HIGH
Neutro Abs: 1.8

## 2011-07-30 LAB — I-STAT 8, (EC8 V) (CONVERTED LAB)
Acid-Base Excess: 5 — ABNORMAL HIGH
BUN: 9
Chloride: 103
HCT: 41
Hemoglobin: 13.9
Operator id: 277751
Potassium: 3.5
Sodium: 137
pCO2, Ven: 35.6 — ABNORMAL LOW

## 2011-07-30 LAB — D-DIMER, QUANTITATIVE: D-Dimer, Quant: 0.24

## 2011-07-30 LAB — POCT I-STAT CREATININE: Creatinine, Ser: 0.9

## 2011-08-09 LAB — COMPREHENSIVE METABOLIC PANEL
Albumin: 4
Alkaline Phosphatase: 42
BUN: 5 — ABNORMAL LOW
CO2: 30
Chloride: 103
GFR calc non Af Amer: >60
Glucose, Bld: 94
Potassium: 3.5
Total Bilirubin: 0.7

## 2011-08-09 LAB — CBC
HCT: 36.6
Hemoglobin: 12.2
MCV: 86.5
RBC: 4.23
WBC: 4.9

## 2011-08-09 LAB — DIFFERENTIAL
Basophils Absolute: 0
Basophils Relative: 0
Monocytes Absolute: 0.4
Neutro Abs: 3.3
Neutrophils Relative %: 68

## 2011-08-13 LAB — CBC
Hemoglobin: 10.6 — ABNORMAL LOW
RDW: 18.9 — ABNORMAL HIGH
WBC: 7.1

## 2011-08-13 LAB — DIFFERENTIAL
Basophils Relative: 0
Eosinophils Absolute: 0 — ABNORMAL LOW
Monocytes Absolute: 0.7
Monocytes Relative: 10
Neutrophils Relative %: 78 — ABNORMAL HIGH

## 2011-08-13 LAB — COMPREHENSIVE METABOLIC PANEL
ALT: 22
Albumin: 4.2
Alkaline Phosphatase: 72
Glucose, Bld: 71
Potassium: 3.4 — ABNORMAL LOW
Sodium: 137
Total Protein: 6.4

## 2011-08-13 LAB — URINALYSIS, ROUTINE W REFLEX MICROSCOPIC
Bilirubin Urine: NEGATIVE
Hgb urine dipstick: NEGATIVE
Protein, ur: NEGATIVE
Urobilinogen, UA: 0.2

## 2011-08-16 LAB — DIFFERENTIAL
Basophils Absolute: 0
Basophils Relative: 1
Eosinophils Absolute: 0 — ABNORMAL LOW
Eosinophils Relative: 0
Lymphocytes Relative: 11 — ABNORMAL LOW
Lymphs Abs: 1
Monocytes Absolute: 0.9
Monocytes Relative: 10
Neutro Abs: 7.3
Neutrophils Relative %: 79 — ABNORMAL HIGH

## 2011-08-16 LAB — BASIC METABOLIC PANEL
CO2: 29
Calcium: 8.9
Creatinine, Ser: 0.64
Glucose, Bld: 95

## 2011-08-16 LAB — CBC
Hemoglobin: 10.1 — ABNORMAL LOW
MCHC: 33.3
RDW: 17.8 — ABNORMAL HIGH

## 2011-08-17 LAB — BASIC METABOLIC PANEL
BUN: 9
CO2: 28
Calcium: 9.1
Chloride: 103
GFR calc Af Amer: >60
GFR calc non Af Amer: >60
Potassium: 3.7
Potassium: 3.9
Sodium: 136

## 2011-08-17 LAB — DIFFERENTIAL
Basophils Relative: 1
Eosinophils Absolute: 0
Eosinophils Relative: 0
Lymphocytes Relative: 12
Lymphs Abs: 1.1
Lymphs Abs: 1
Monocytes Absolute: 0.8 — ABNORMAL HIGH
Monocytes Relative: 9
Neutro Abs: 7
Neutrophils Relative %: 77

## 2011-08-17 LAB — CBC
HCT: 31.2 — ABNORMAL LOW
HCT: 31.4 — ABNORMAL LOW
Hemoglobin: 10.7 — ABNORMAL LOW
MCHC: 34.1
MCV: 83.7
Platelets: 187
RBC: 3.75 — ABNORMAL LOW
WBC: 9
WBC: 8.4

## 2011-08-18 LAB — BASIC METABOLIC PANEL
BUN: 7
CO2: 28
Calcium: 9.1
GFR calc non Af Amer: >60
Glucose, Bld: 94

## 2011-08-18 LAB — DIFFERENTIAL
Basophils Absolute: 0
Basophils Relative: 0
Eosinophils Relative: 0
Lymphocytes Relative: 11 — ABNORMAL LOW
Monocytes Absolute: 0.6

## 2011-08-18 LAB — CBC
HCT: 32.9 — ABNORMAL LOW
Hemoglobin: 11.2 — ABNORMAL LOW
MCHC: 34
Platelets: 159
RDW: 12.7

## 2011-08-19 LAB — CBC
HCT: 39
HCT: 43.7
Hemoglobin: 13.4
Hemoglobin: 14.4
MCHC: 33
MCHC: 34.3
MCV: 85
MCV: 85.9
Platelets: 172
Platelets: 309
RBC: 4.37
RBC: 4.58
RBC: 5.08
RDW: 13.2
WBC: 7.2
WBC: 7.2
WBC: 8.6

## 2011-08-19 LAB — DIFFERENTIAL
Basophils Absolute: 0
Basophils Absolute: 0
Basophils Relative: 1
Eosinophils Absolute: 0
Eosinophils Absolute: 0.2
Eosinophils Relative: 2
Lymphocytes Relative: 22
Lymphocytes Relative: 21
Lymphocytes Relative: 24
Lymphs Abs: 1.6
Lymphs Abs: 1.8
Monocytes Absolute: 0.4
Monocytes Absolute: 0.5
Monocytes Relative: 10
Monocytes Relative: 4
Neutro Abs: 4.8
Neutro Abs: 4.8
Neutro Abs: 6.2
Neutrophils Relative %: 67
Neutrophils Relative %: 66
Neutrophils Relative %: 72

## 2011-08-19 LAB — BASIC METABOLIC PANEL
BUN: 8
Calcium: 9
Calcium: 9
Chloride: 102
Creatinine, Ser: 0.78
Creatinine, Ser: 0.63
GFR calc Af Amer: >60
GFR calc Af Amer: >60
GFR calc non Af Amer: >60
GFR calc non Af Amer: >60
Glucose, Bld: 81
Sodium: 135

## 2011-08-19 LAB — COMPREHENSIVE METABOLIC PANEL
AST: 27
CO2: 27
Chloride: 101
Creatinine, Ser: 0.8
GFR calc Af Amer: >60
GFR calc non Af Amer: >60
Glucose, Bld: 100 — ABNORMAL HIGH
Total Bilirubin: 0.7

## 2011-08-19 LAB — FOLLICLE STIMULATING HORMONE: FSH: 6.6

## 2011-08-27 ENCOUNTER — Encounter (HOSPITAL_COMMUNITY): Payer: BC Managed Care – PPO | Admitting: Oncology

## 2011-08-27 ENCOUNTER — Ambulatory Visit (HOSPITAL_COMMUNITY): Payer: BC Managed Care – PPO

## 2011-09-20 ENCOUNTER — Ambulatory Visit (HOSPITAL_COMMUNITY): Payer: BC Managed Care – PPO | Admitting: Oncology

## 2011-10-11 ENCOUNTER — Encounter (HOSPITAL_COMMUNITY): Payer: Self-pay

## 2011-10-11 ENCOUNTER — Encounter (HOSPITAL_COMMUNITY): Payer: BC Managed Care – PPO | Attending: Hematology and Oncology

## 2011-10-11 DIAGNOSIS — Z9221 Personal history of antineoplastic chemotherapy: Secondary | ICD-10-CM | POA: Insufficient documentation

## 2011-10-11 DIAGNOSIS — F329 Major depressive disorder, single episode, unspecified: Secondary | ICD-10-CM | POA: Insufficient documentation

## 2011-10-11 DIAGNOSIS — Z853 Personal history of malignant neoplasm of breast: Secondary | ICD-10-CM | POA: Insufficient documentation

## 2011-10-11 DIAGNOSIS — C50219 Malignant neoplasm of upper-inner quadrant of unspecified female breast: Secondary | ICD-10-CM

## 2011-10-11 DIAGNOSIS — K5900 Constipation, unspecified: Secondary | ICD-10-CM | POA: Insufficient documentation

## 2011-10-11 DIAGNOSIS — Z09 Encounter for follow-up examination after completed treatment for conditions other than malignant neoplasm: Secondary | ICD-10-CM | POA: Insufficient documentation

## 2011-10-11 DIAGNOSIS — IMO0001 Reserved for inherently not codable concepts without codable children: Secondary | ICD-10-CM | POA: Insufficient documentation

## 2011-10-11 DIAGNOSIS — F3289 Other specified depressive episodes: Secondary | ICD-10-CM | POA: Insufficient documentation

## 2011-10-11 LAB — COMPREHENSIVE METABOLIC PANEL
ALT: 24 U/L (ref 0–35)
AST: 23 U/L (ref 0–37)
Albumin: 3.8 g/dL (ref 3.5–5.2)
Alkaline Phosphatase: 78 U/L (ref 39–117)
CO2: 32 mEq/L (ref 19–32)
Chloride: 99 mEq/L (ref 96–112)
Creatinine, Ser: 0.85 mg/dL (ref 0.50–1.10)
GFR calc non Af Amer: 77 mL/min — ABNORMAL LOW (ref >90)
Potassium: 3.6 mEq/L (ref 3.5–5.1)
Sodium: 138 mEq/L (ref 135–145)
Total Bilirubin: 0.2 mg/dL — ABNORMAL LOW (ref 0.3–1.2)

## 2011-10-11 NOTE — Progress Notes (Signed)
Addended by: Sterling Big on: 10/11/2011 03:36 PM   Modules accepted: Orders

## 2011-10-11 NOTE — Progress Notes (Signed)
This office note has been dictated.

## 2011-10-11 NOTE — Progress Notes (Signed)
CC:   [name], M.D. Angelia Mould. Derrell Lolling, M.D. Juluis Mire, M.D.  IDENTIFYING STATEMENT:  The patient is a 50 year old woman with breast cancer who presents for followup.  HISTORY OF PRESENT ILLNESS:  [name] was last seen here at the [facility] in April 2012.  Following that visit, she was diagnosed with tinnitus.  The patient feels that this is likely related to the number of medications that she is currently taking.  She has a past medical history significant for fibromyalgia.  She has occasional aches and pains.  She denies breast wall pain, lesions, or breast masses.  She has not lost any significant weight.  She is very prone to constipation and requires ongoing laxatives.  Her last colonoscopy the patient recalls was a year ago.  She is due for a mammogram.  She describes feeling sluggish.  She denies cough or shortness of breath.  MEDICATIONS:  Medications reviewed and updated.  ALLERGIES:  Sulfonamides, sumatriptan, and duloxetine.  PAST MEDICAL HISTORY:  Unchanged.  FAMILY HISTORY:  Unchanged.  SOCIAL HISTORY:  Unchanged.  PHYSICAL EXAMINATION:  General:  Alert and oriented x3.  Vitals:  Pulse 81, blood pressure 94/58, temperature 98.9, respirations 20, weight 163 pounds.  HEENT:  Head is atraumatic, normocephalic.  Sclerae anicteric. Mouth moist.  Neck:  Supple without adenopathy.  Chest:  Clear to percussion auscultation.  Breast Exam:  Notes bilateral breast implants. No surrounding palpable breast masses.  No palpable axillary adenopathy. Abdomen:  Soft.  No masses.  No hepatosplenomegaly.  Bowel sounds present.  Extremities:  No calf tenderness.  Pulses present and symmetrical.  CNS:  Nonfocal.  IMPRESSION AND PLAN:  [name] is a 50 year old woman with a history of a clinical stage IIB right-sided breast cancer measuring 8.5 cm with no pathological lymph nodes.  Tumor was estrogen receptor positive at 90%, progesterone receptor  positive at 79%, Ki-67 marker 26%, and HER- 2/neu negative by IHC as well as FISH.  She received 6 cycles of FEC. She went on to have radiation.  She was placed on tamoxifen on 02/26/2008, but stopped on 02/14/2009 due to intolerance.  The patient's current physical exam indicates no evidence of recurrence.  However, she is due for mammogram and the patient reports she will be calling to schedule at the [facility] in [place].  She is up-to-date with her colonoscopy.  We spent some time discussing use of laxatives with addition of Activia and prune juice to help manage constipation.  Prior to discharge from the clinic, in addition to a CBC and CMET, we will obtain a TSH.  We will telephone her with the results. She has a number of other medical issues, including depression, severe fibromyalgia, and has received a hysterectomy in the past.  The patient follows up with Dr. Mariel Sleet in 8 months' time or sooner if needed.    ______________________________ Laurice Record, M.D. LIO/MEDQ  D:  10/11/2011  T:  10/11/2011  Job:  [id number]

## 2011-10-11 NOTE — Progress Notes (Signed)
[name] presented for labwork. Labs per MD order drawn via Peripheral Line 23 gauge needle inserted in left AC  Good blood return present. Procedure without incident.  Needle removed intact. Patient tolerated procedure well.

## 2011-10-11 NOTE — Patient Instructions (Signed)
[name]  [id number] 1960/11/18  [facility]  Discharge Instructions  RECOMMENDATIONS MADE BY THE CONSULTANT AND ANY TEST RESULTS WILL BE SENT TO YOUR REFERRING DOCTOR.   EXAM FINDINGS BY MD TODAY AND SIGNS AND SYMPTOMS TO REPORT TO CLINIC OR PRIMARY MD: You seem to be doing well.  We will check some labs today and will call you with any abnormal results.  MEDICATIONS PRESCRIBED: none   INSTRUCTIONS GIVEN AND DISCUSSED: Other :  Report any new lumps, bone pain or shortness of breath.  SPECIAL INSTRUCTIONS/FOLLOW-UP: Return to Clinic on:  See Schedule.   I acknowledge that I have been informed and understand all the instructions given to me and received a copy. I do not have any more questions at this time, but understand that I may call the Specialty Clinic at [facility] at [phone] during business hours should I have any further questions or need assistance in obtaining follow-up care.    __________________________________________  _____________  __________ Signature of Patient or Authorized Representative            Date                   Time    __________________________________________ Nurse's Signature

## 2011-11-10 ENCOUNTER — Other Ambulatory Visit: Payer: Self-pay | Admitting: Obstetrics and Gynecology

## 2011-11-10 DIAGNOSIS — Z1231 Encounter for screening mammogram for malignant neoplasm of breast: Secondary | ICD-10-CM

## 2011-11-25 ENCOUNTER — Ambulatory Visit: Payer: BC Managed Care – PPO

## 2011-12-10 ENCOUNTER — Ambulatory Visit
Admission: RE | Admit: 2011-12-10 | Discharge: 2011-12-10 | Disposition: A | Discharge status: Home / Self Care | Payer: BC Managed Care – PPO | Source: Ambulatory Visit | Attending: Obstetrics and Gynecology | Admitting: Obstetrics and Gynecology

## 2011-12-10 DIAGNOSIS — Z1231 Encounter for screening mammogram for malignant neoplasm of breast: Secondary | ICD-10-CM

## 2012-03-20 ENCOUNTER — Telehealth (HOSPITAL_COMMUNITY): Payer: Self-pay | Admitting: *Deleted

## 2012-05-15 ENCOUNTER — Telehealth (HOSPITAL_COMMUNITY): Payer: Self-pay | Admitting: Oncology

## 2012-06-08 ENCOUNTER — Encounter (HOSPITAL_COMMUNITY): Payer: BC Managed Care – PPO | Attending: Oncology | Admitting: Oncology

## 2012-06-08 VITALS — BP 101/68 | HR 77 | Temp 98.5°F | Wt 176.2 lb

## 2012-06-08 DIAGNOSIS — Z170 Estrogen receptor positive status [ER+]: Secondary | ICD-10-CM

## 2012-06-08 DIAGNOSIS — C50219 Malignant neoplasm of upper-inner quadrant of unspecified female breast: Secondary | ICD-10-CM

## 2012-06-08 DIAGNOSIS — IMO0001 Reserved for inherently not codable concepts without codable children: Secondary | ICD-10-CM | POA: Insufficient documentation

## 2012-06-08 DIAGNOSIS — Z09 Encounter for follow-up examination after completed treatment for conditions other than malignant neoplasm: Secondary | ICD-10-CM | POA: Insufficient documentation

## 2012-06-08 DIAGNOSIS — Z853 Personal history of malignant neoplasm of breast: Secondary | ICD-10-CM | POA: Insufficient documentation

## 2012-06-08 NOTE — Progress Notes (Signed)
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 She was then treated after surgeries radiation therapy and then tamoxifen started on 02/26/2008 was stopped on 02/24/2009 do intolerance. In one portion of her path report is said to be grade 2. Problem #2 severe fibromyalgia with chronic pain on therapy per Dr Vear Clock with [facility] and she does see a psychiatrist for this as well. She also has chronic discomfort in the right chest wall and axillary area which is not new or different.  [name] is doing as well as I think she can do at this time. I think she is very stable and her review of systems is still positive for loss of discomfort in her back. She's had some worsening in the last couple weeks but we'll go back to see her pain clinic Dr. in the next couple weeks. If this does not get better she may need some x-rays.  Vital signs are stable but she has gained 10 pounds in approximately a year and 4 months. Lymph nodes remain negative and the cervical supraclavicular infraclavicular axillary or inguinal areas. Lungs are clear. Heart shows a regular rhythm and rate without murmur rub or gallop. Both reconstructed breast showed no nodularity. The right is slightly smaller than the left which is not perfectly happy with but I have not recommended more surgery for her. Her abdomen is slightly obese but without obvious hepatosplenomegaly. Bowel sounds are diminished but present. She does not have leg or arm edema. I think she does have paravertebral muscle spasticity especially in the low back.  See her in 6 months we will do some blood work then. If her back pain has not getting any better than she may need some x-rays as I  mentioned above

## 2012-06-08 NOTE — Patient Instructions (Signed)
[facility]  Discharge Instructions  RECOMMENDATIONS MADE BY THE CONSULTANT AND ANY TEST RESULTS WILL BE SENT TO YOUR REFERRING DOCTOR.  SPECIAL INSTRUCTIONS/FOLLOW-UP: Return to Clinic in one year.  Please see the front desk for appointments as you leave.     I acknowledge that I have been informed and understand all the instructions given to me and received a copy. I do not have any more questions at this time, but understand that I may call the Specialty Clinic at [facility] at [phone] during business hours should I have any further questions or need assistance in obtaining follow-up care.    __________________________________________  _____________  __________ Signature of Patient or Authorized Representative            Date                   Time    __________________________________________ Nurse's Signature

## 2012-06-09 ENCOUNTER — Ambulatory Visit (HOSPITAL_COMMUNITY): Payer: BC Managed Care – PPO | Admitting: Oncology

## 2012-06-12 ENCOUNTER — Ambulatory Visit (HOSPITAL_COMMUNITY): Payer: BC Managed Care – PPO | Admitting: Oncology

## 2012-12-11 ENCOUNTER — Other Ambulatory Visit: Payer: Self-pay | Admitting: Obstetrics and Gynecology

## 2012-12-11 DIAGNOSIS — Z1231 Encounter for screening mammogram for malignant neoplasm of breast: Secondary | ICD-10-CM

## 2012-12-15 ENCOUNTER — Encounter (HOSPITAL_COMMUNITY): Payer: BC Managed Care – PPO | Attending: Oncology | Admitting: Oncology

## 2012-12-15 ENCOUNTER — Encounter (HOSPITAL_COMMUNITY): Payer: Self-pay | Admitting: Oncology

## 2012-12-15 VITALS — BP 104/66 | HR 78 | Temp 98.0°F | Resp 18 | Wt 178.0 lb

## 2012-12-15 DIAGNOSIS — F329 Major depressive disorder, single episode, unspecified: Secondary | ICD-10-CM

## 2012-12-15 DIAGNOSIS — IMO0001 Reserved for inherently not codable concepts without codable children: Secondary | ICD-10-CM

## 2012-12-15 DIAGNOSIS — Z853 Personal history of malignant neoplasm of breast: Secondary | ICD-10-CM

## 2012-12-15 NOTE — Progress Notes (Signed)
Problem number 1 invasive ductal carcinoma the right breast 4.0 cm in size, grade 3, with 1 of 9 positive lymph nodes as well as LV I. She had extracapsular extension. ER +90%, PR +79, HER-2 but not amplified, Ki-67 are her height 26% with surgery on 10/30/2007 preceded by chemotherapy with FEC. She will receive radiation therapy postoperatively followed by tamoxifen started on 02/26/2008 but stopped on 02/24/2009 secondary to intolerance. One part of her path report did however mention that this was grade 2. 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 Doing better and try to go to school. 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 Review of systems oncology wise is negative.  She had blood work recently by her family physician, Dr. Clelia Croft We will try to obtain this blood work.  Vital signs are stable otherwise and she does remain slightly overweight for her height without question. She has no lymphadenopathy. Lungs are clear. Heart shows a regular rhythm and rate without murmur rub or gallop. Both reconstructed breasts are negative. She does not have distinct arm edema. Abdomen is slightly tight because of prior therapy surgically that is. She has no obvious hepatosplenomegaly. Bowel sounds are definitely diminished. She has no leg edema.  Skin exam is unremarkable for any significant lesions.  We'll see her back in 6 months

## 2012-12-15 NOTE — Patient Instructions (Addendum)
.  [facility] Discharge Instructions  RECOMMENDATIONS MADE BY THE CONSULTANT AND ANY TEST RESULTS WILL BE SENT TO YOUR REFERRING PHYSICIAN.  EXAM FINDINGS BY THE PHYSICIAN TODAY AND SIGNS OR SYMPTOMS TO REPORT TO CLINIC OR PRIMARY PHYSICIAN: Exam good today.   SPECIAL INSTRUCTIONS/FOLLOW-UP: 6 months  Thank you for choosing [facility] to provide your oncology and hematology care.  To afford each patient quality time with our providers, please arrive at least 15 minutes before your scheduled appointment time.  With your help, our goal is to use those 15 minutes to complete the necessary work-up to ensure our physicians have the information they need to help with your evaluation and healthcare recommendations.    Effective January 1st, 2014, we ask that you re-schedule your appointment with our physicians should you arrive 10 or more minutes late for your appointment.  We strive to give you quality time with our providers, and arriving late affects you and other patients whose appointments are after yours.    Again, thank you for choosing [facility].  Our hope is that these requests will decrease the amount of time that you wait before being seen by our physicians.       _____________________________________________________________  Should you have questions after your visit to [facility], please contact our office at [phone] between the hours of 8:30 a.m. and 5:00 p.m.  Voicemails left after 4:30 p.m. will not be returned until the following business day.  For prescription refill requests, have your pharmacy contact our office with your prescription refill request.

## 2013-01-10 ENCOUNTER — Ambulatory Visit: Payer: BC Managed Care – PPO

## 2013-01-22 ENCOUNTER — Encounter: Payer: Self-pay | Admitting: Oncology

## 2013-01-31 ENCOUNTER — Other Ambulatory Visit: Payer: Self-pay | Admitting: *Deleted

## 2013-01-31 ENCOUNTER — Telehealth: Payer: Self-pay | Admitting: *Deleted

## 2013-01-31 ENCOUNTER — Ambulatory Visit (INDEPENDENT_AMBULATORY_CARE_PROVIDER_SITE_OTHER): Payer: BC Managed Care – PPO

## 2013-01-31 ENCOUNTER — Encounter: Payer: Self-pay | Admitting: *Deleted

## 2013-01-31 DIAGNOSIS — G4710 Hypersomnia, unspecified: Secondary | ICD-10-CM

## 2013-01-31 DIAGNOSIS — G4733 Obstructive sleep apnea (adult) (pediatric): Secondary | ICD-10-CM

## 2013-01-31 DIAGNOSIS — R0989 Other specified symptoms and signs involving the circulatory and respiratory systems: Secondary | ICD-10-CM

## 2013-01-31 DIAGNOSIS — R0609 Other forms of dyspnea: Secondary | ICD-10-CM

## 2013-01-31 NOTE — Telephone Encounter (Signed)
Scheduled patient for CPAP TITRATION tonight at 9:30 pm. -sh

## 2013-01-31 NOTE — Telephone Encounter (Signed)
Error

## 2013-02-08 ENCOUNTER — Ambulatory Visit: Payer: BC Managed Care – PPO

## 2013-02-13 ENCOUNTER — Other Ambulatory Visit: Payer: Self-pay | Admitting: Neurology

## 2013-02-13 DIAGNOSIS — G4731 Primary central sleep apnea: Secondary | ICD-10-CM

## 2013-02-15 ENCOUNTER — Telehealth: Payer: Self-pay | Admitting: *Deleted

## 2013-02-15 DIAGNOSIS — G4710 Hypersomnia, unspecified: Secondary | ICD-10-CM

## 2013-02-15 DIAGNOSIS — G478 Other sleep disorders: Secondary | ICD-10-CM

## 2013-02-15 MED ORDER — ARMODAFINIL 250 MG PO TABS: 250.0000 mg | ORAL_TABLET | Freq: Every day | ORAL | Status: DC

## 2013-02-15 NOTE — Telephone Encounter (Signed)
Spent time going over CPAP Titration results with patient, explained Dr. Oliva Bustard recommendations to start using CPAP at home at 6 cm H2O with EPR of 2.  Patient understood DME company would call to set up.  Pt explains she has been using Nuvigil and it is helping her EDS although she still feels quite tired at the end of day, she has 6 pills at this time and would like a refill if possible.  Pharmacy is [facility] on [address].  Sleep consult was scheduled for May 9th at 1:00, pt told to come 20-30 minutes early and to bring her CPAP with her. -sh

## 2013-02-21 ENCOUNTER — Ambulatory Visit
Admission: RE | Admit: 2013-02-21 | Discharge: 2013-02-21 | Disposition: A | Discharge status: Home / Self Care | Payer: BC Managed Care – PPO | Source: Ambulatory Visit | Attending: Obstetrics and Gynecology | Admitting: Obstetrics and Gynecology

## 2013-02-21 DIAGNOSIS — Z1231 Encounter for screening mammogram for malignant neoplasm of breast: Secondary | ICD-10-CM

## 2013-03-08 ENCOUNTER — Other Ambulatory Visit: Payer: Self-pay | Admitting: Physician Assistant

## 2013-03-08 ENCOUNTER — Ambulatory Visit
Admission: RE | Admit: 2013-03-08 | Discharge: 2013-03-08 | Disposition: A | Discharge status: Home / Self Care | Payer: BC Managed Care – PPO | Source: Ambulatory Visit | Attending: Physician Assistant | Admitting: Physician Assistant

## 2013-03-08 DIAGNOSIS — M542 Cervicalgia: Secondary | ICD-10-CM

## 2013-03-16 ENCOUNTER — Institutional Professional Consult (permissible substitution): Payer: Self-pay | Admitting: Neurology

## 2013-03-30 ENCOUNTER — Encounter: Payer: Self-pay | Admitting: Neurology

## 2013-03-30 ENCOUNTER — Ambulatory Visit (INDEPENDENT_AMBULATORY_CARE_PROVIDER_SITE_OTHER): Payer: BC Managed Care – PPO | Admitting: Neurology

## 2013-03-30 ENCOUNTER — Other Ambulatory Visit: Payer: Self-pay | Admitting: *Deleted

## 2013-03-30 VITALS — BP 107/67 | HR 74 | Ht 64.5 in | Wt 175.0 lb

## 2013-03-30 DIAGNOSIS — G4733 Obstructive sleep apnea (adult) (pediatric): Secondary | ICD-10-CM

## 2013-03-30 DIAGNOSIS — G4710 Hypersomnia, unspecified: Secondary | ICD-10-CM

## 2013-03-30 MED ORDER — ARMODAFINIL 250 MG PO TABS: 250.0000 mg | ORAL_TABLET | Freq: Every day | ORAL | Status: DC

## 2013-03-30 NOTE — Progress Notes (Signed)
[facility]  Provider:  Dr Vickey Huger Referring Provider: Plotnikov, Georgina Quint, MD Primary Care Physician:  Sonda Primes, MD  Chief Complaint  Patient presents with  . Follow-up    rm 10   ROS- constipation, aching muscles, depression, anxiety, decreased energy    HPI:  [name] is a 52 y.o. female here as a referral from Dr. Posey Rea for hypersomnia , CPAP compliance in newly diagnosed Apnea,  HST AHi was 20. Titrated to 6 cm water , download reviewed here 03-29-13  at  100% compliance, 30 days.  The patient reports that she has not trouble using the CPAP machine except for difficulties with the smaller nasal pillow slipping into the nostril. She also states that the Provigil has helped her to drive safely and to attend classes in daytime. He takes 250 mg daily, but by lunchtime the effect wears off. Again todays Epworth sleepiness score was 20 points, her pretest sleepiness score was at 22 points. She has persistent hypersomnia with only marginal improvement by CPAP, and the residual AHI of 6.4 but could be further reduced. I consider the response to individual not optimal and would like to try this patient on Xyrem. For this I need to test her for narcolepsy by NSLT. An MSL T. by performed under REM sleep suppressant medication is considered invalid, therefore is the alternative is  A CSF study for hypocretin / orexin levels. I will arrange for a CT head and spinal tap at [facility] imaging in conjunction with the samples to be sent to [facility] in [place] for the sleep [employer].          Review of Systems: Out of a complete 14 system review, the patient complains of only the following symptoms, and all other reviewed systems are negative. Epworth of 20 points,  NUVIGIL helps.   History   Social History  . Marital Status: Married    Spouse Name: N/A    Number of Children: N/A  . Years of Education: N/A   Occupational History  .  Not on file.   Social History Main Topics  . Smoking status: Former Games developer  . Smokeless tobacco: Never Used  . Alcohol Use: Yes  . Drug Use: No  . Sexually Active: Not on file   Other Topics Concern  . Not on file   Social History Narrative  . No narrative on file    History reviewed. No pertinent family history.  Past Medical History  Diagnosis Date  . Breast cancer     rt breast  and left breast breast removed  . Anxiety   . Depression, major   . Fibromyalgia   . Breast reconstruction deformity     Past Surgical History  Procedure Laterality Date  . Hysterestomy    . Carpal tunnel release      both hands  . Wisdom teeth out    . Btl    . Breast reconstructioin      Current Outpatient Prescriptions  Medication Sig Dispense Refill  . ARIPiprazole (ABILIFY) 2 MG tablet Take 2 mg by mouth daily.        . Armodafinil (NUVIGIL) 250 MG tablet Take 250 mg by mouth daily.      . benztropine (COGENTIN) 0.5 MG tablet Take 0.5 mg by mouth at bedtime as needed and may repeat dose one time if needed.        . citalopram (CELEXA) 40 MG tablet Take 40 mg by mouth daily.        Marland Kitchen  Flavocoxid (LIMBREL) 500 MG CAPS Take 500 mg by mouth 2 (two) times daily.      Marland Kitchen gabapentin (NEURONTIN) 300 MG capsule Take 600 mg by mouth 2 (two) times daily.       Marland Kitchen HYDROcodone-acetaminophen (VICODIN) 5-500 MG per tablet Take 1 tablet by mouth every 6 (six) hours as needed.      . Multiple Vitamins-Minerals (MULTIVITAMIN WITH MINERALS) tablet Take 1 tablet by mouth daily.      . Nutritional Supplements (MELATONIN PO) Take by mouth Nightly.        . propranolol (INDERAL) 20 MG tablet Take 20 mg by mouth 3 (three) times daily. Patient is taking an extended release, not sure of dosage.      . Tapentadol HCl (NUCYNTA PO) Take by mouth 2 (two) times daily.         No current facility-administered medications for this visit.    Allergies as of 03/30/2013 - Review Complete 03/30/2013  Allergen Reaction  Noted  . Sulfonamide derivatives Hives     Vitals: BP 107/67  Pulse 74  Ht 5' 4.5" (1.638 m)  Wt 175 lb (79.379 kg)  BMI 29.59 kg/m2 Last Weight:  Wt Readings from Last 1 Encounters:  03/30/13 175 lb (79.379 kg)   Last Height:   Ht Readings from Last 1 Encounters:  03/30/13 5' 4.5" (1.638 m)     Physical exam:  General: The patient is awake, alert and appears not in acute distress. The patient is well groomed. Head: Normocephalic, atraumatic. Neck is supple. Mallampati 2, neck circumference:14.5 cm  Cardiovascular:  Regular rate and rhythm, without  murmurs or carotid bruit, and without distended neck veins. Respiratory: Lungs are clear to auscultation. Skin:  Without evidence of edema, or rash Trunk: BMI is 29 elevated .  Neurologic exam : The patient is awake and alert, oriented to place and time.  Memory subjective  described as intact. There is a normal attention span & concentration ability. Speech is fluent without  dysarthria, dysphonia or aphasia. Mood and affect are appropriate.  Cranial nerves: Pupils are equal and briskly reactive to light. Funduscopic exam without evidence of pallor or edema. Extraocular movements  in vertical and horizontal planes intact and without nystagmus. Visual fields by finger perimetry are intact. Hearing to finger rub intact.  Facial sensation intact to fine touch. Facial motor strength is symmetric and tongue and uvula move midline.  Motor exam: Normal tone and normal muscle bulk and symmetric normal strength in all extremities.  Sensory:  Fine touch, pinprick and vibration were tested in all extremities. Proprioception is tested in the upper extremities only.  Coordination: Rapid alternating movements in the fingers/hands is tested and normal. Finger-to-nose maneuver tested and normal without evidence of ataxia, dysmetria or tremor.  Gait and station: Patient walks without assistive device and is able and assisted stool climb up to the  exam table. Strength within normal limits. Deep tendon reflexes: in the  upper and lower extremities are symmetric and intact.    Assessment:  After physical and neurologic examination, review of laboratory studies, imaging, neurophysiology testing and pre-existing records, assessment will be reviewed on the problem list.  Plan:  Treatment plan and additional workup will be reviewed under Problem List.   The patient continues to feel excessively daytime sleepy also she states that in daytime Nuvigil redo [place] as her irresistible urge to fall asleep. She tested positive for obstructive sleep apnea in a home sleep test and is using CPAP at home  at 6 cm water pressure she has an average she was at time of 6 hours and 38 minutes at night. She is 100% compliance rates and has used the CPAP and 30 over 30 days. Her residual AHI of 6.4 and may be reducible further by increasing the pressure just a 7 cm she sometimes feels that the mask slips of labs and in general the mask is comfortable.  [facility].  Husband also uses CPAP so she was familiar with the machine before.  Home Sleep test was dated 01/22/2013, followed in April  by an in laboratory titration study during which 6 cm water were determined to be the best tolerated CPAP pressure.

## 2013-03-30 NOTE — Patient Instructions (Addendum)
CPAP and BIPAP CPAP and BIPAP are methods of helping you breathe. CPAP stands for "continuous positive airway pressure." BIPAP stands for "bi-level positive airway pressure." Both CPAP and BIPAP are provided by a small machine with a flexible plastic tube that attaches to a plastic mask that goes over your nose or mouth. Air is blown into your air passages through your nose or mouth. This helps to keep your airways open and helps to keep you breathing well. The amount of pressure that is used to blow the air into your air passages can be set on the machine. The pressure setting is based on your needs. With CPAP, the amount of pressure stays the same while you breathe in and out. With BIPAP, the amount of pressure changes when you inhale and exhale. Your caregiver will recommend whether CPAP or BIPAP would be more helpful for you.  CPAP and BIPAP can be helpful for both adults and children with:  Sleep apnea.  Chronic Obstructive Pulmonary Disease (COPD), a condition like emphysema.  Diseases which weaken the muscles of the chest such as muscular dystrophy or neurological diseases.  Other problems that cause breathing to be weak or difficult. USE OF CPAP OR BIPAP The respiratory therapist or technician will help you get used to wearing the mask. Some people feel claustrophobic (a trapped or closed in feeling) at first, because the mask needs to be fairly snug on your face.   It may help you to get used to the mask gradually, by first holding the mask loosely over your nose or mouth using a low pressure setting on the machine. Gradually the mask can be applied more snugly with increased pressure. You can also gradually increase the amount of time the mask is used.  People with sleep apnea will use the mask and machine at night when they are sleeping. Others, like those with ALS or other breathing difficulties, may need the CPAP or BIPAP all the time.  If the first mask you try does not fit well, or  is uncomfortable, there are other types and sizes that can be tried.  If you tend to breathe through your mouth, a chin strap may be applied to help keep your mouth closed (if you are using a nasal mask).  The CPAP and BIPAP machines have alarms that may sound if the mask comes off or develops a leak.  You should not eat or drink while the CPAP or BIPAP is on. Food or fluids could get pushed into your lungs by the pressure of the CPAP or BIPAP. Sometimes CPAP or BIPAP machines are ordered for home use. If you are going to use the CPAP or BIPAP machine at home, follow these instructions  CPAP or BIPAP machines can be rented or purchased through home health care companies. There are many different brands of machines available. If you rent a machine before purchasing you may find which particular machine works well for you.  Ask questions if there is something you do not understand when picking out your machine.  Place your CPAP or BIPAP machine on a secure table or stand near an electrical outlet.  Know where the On/Off switch is.  Follow your doctor's instructions for how to set the pressure on your machine and when you should use it.  Do not smoke! Tobacco smoke residue can damage the machine. SEEK IMMEDIATE MEDICAL CARE IF:   You have redness or open areas around your nose or mouth.  You have trouble operating  the CPAP or BIPAP machine.  You cannot tolerate wearing the CPAP or BIPAP mask.  You have any questions or concerns. Document Released: 07/23/2004 Document Revised: 01/17/2012 Document Reviewed: 10/22/2008 [facility] Patient Information 2014 [place], [place]. Hypersomnia Hypersomnia usually brings recurrent episodes of excessive daytime sleepiness or prolonged nighttime sleep. It is different than feeling tired due to lack of or interrupted sleep at night. People with hypersomnia are compelled to nap repeatedly during the day. This is often at inappropriate times such as:  At  work.  During a meal.  In conversation. These daytime naps usually provide no relief. This disorder typically affects adolescents and young adults. CAUSES  This condition may be caused by:  Another sleep disorder (such as narcolepsy or sleep apnea).  Dysfunction of the autonomic nervous system.  Drug or alcohol abuse.  A physical problem, such as:  A tumor.  Head trauma. This is damage caused by an accident.  Injury to the central nervous system.  Certain medications, or medicine withdrawal.  Medical conditions may contribute to the disorder, including:  Multiple sclerosis.  Depression.  Encephalitis.  Epilepsy.  Obesity.  Some people appear to have a genetic predisposition to this disorder. In others, there is no known cause. SYMPTOMS   Patients often have difficulty waking from a long sleep. They may feel dazed or confused.  Other symptoms may include:  Anxiety.  Increased irritation (inflammation).  Decreased energy.  Restlessness.  Slow thinking.  Slow speech.  Loss of appetite.  Hallucinations.  Memory difficulty.  Tremors, Tics.  Some patients lose the ability to function in family, social, occupational, or other settings. TREATMENT  Treatment is symptomatic in nature. Stimulants and other drugs may be used to treat this disorder. Changes in behavior may help. For example, avoid night work and social activities that delay bed time. Changes in diet may offer some relief. Patients should avoid alcohol and caffeine. PROGNOSIS  The likely outcome (prognosis) for persons with hypersomnia depends on the cause of the disorder. The disorder itself is not life threatening. But it can have serious consequences. For example, automobile accidents can be caused by falling asleep while driving. The attacks usually continue indefinitely. Document Released: 10/15/2002 Document Revised: 01/17/2012 Document Reviewed: 09/18/2008 [facility] Patient Information  2014 [place], [place]. Narcolepsy Narcolepsy is a disabling neurological disorder of sleep regulation. It affects the control of sleep. It also affects the control of wakefulness. It is an interruption of the dreaming state of sleep. This state is known as REM or rapid eye movement sleep.  SYMPTOMS  The development, number, and severity of symptoms vary widely among people with the disorder. Symptoms generally begin between the ages of 40 and 60. The four classic symptoms of the disorder are:   Excessive daytime sleepiness.  Cataplexy. This is sudden, brief episodes of muscle weakness or paralysis. It is caused by strong emotions. Common strong emotions are laughter, anger, surprise, or anticipation.  Sleep paralysis. This is paralysis upon falling asleep or waking up.  Hallucinations. These are vivid dream-like images that occur at when you first fall asleep. Other symptoms include:   Unrelenting excessive sleepiness. This is usually the first and most obvious symptom.  Sleep attacks. Patients have strong sleep attacks throughout the day. These attacks can last for 30 seconds to more than 30 minutes. These happen no matter how much or how well the person slept the night before. These attacks end up making the person sleep at work and social events. The person can fall  asleep while eating, talking, and driving. They also fall asleep at other out of place times.  Disturbed nighttime sleep.  Tossing and turning in bed.  Leg jerks.  Nightmares.  Waking up often. DIAGNOSIS  It's possible that genetics play a role in this disorder. Narcolepsy is not a rare disorder. It is often misdiagnosed. It is often diagnosed years after symptoms first appear. Early diagnosis and treatment are important. This help the physical and mental well-being of the patient. TREATMENT  There is no cure for narcolepsy. The symptoms can be controlled with behavioral and medical therapy. The excessive daytime  sleepiness may be treated with stimulant drugs. It may also be treated with the drug modafinil (Provigil). Cataplexy and other REM-sleep symptoms may be treated with antidepressant medications. Medications will reduce the symptoms. Medications will not ease symptoms entirely. Many available medications have side effects. Basic lifestyle changes may also reduce the symptoms. These changes include having regular sleep schedules and scheduled daytime naps. Other lifestyle changes include avoiding "over-stimulating" situations. Document Released: 10/15/2002 Document Revised: 01/17/2012 Document Reviewed: 10/25/2005 [facility] Patient Information 2014 [place], [place].

## 2013-04-06 ENCOUNTER — Telehealth: Payer: Self-pay | Admitting: Neurology

## 2013-04-06 NOTE — Telephone Encounter (Signed)
Sherri from [facility] called and said that when she talked to the patient about scheduling her CT scan, the patient stated she was supposed to have a lumber puncture.  Please advise.  Also, will need an order for this.

## 2013-04-10 ENCOUNTER — Telehealth: Payer: Self-pay | Admitting: Neurology

## 2013-04-20 ENCOUNTER — Telehealth: Payer: Self-pay | Admitting: Neurology

## 2013-04-20 NOTE — Telephone Encounter (Addendum)
To [name]

## 2013-04-20 NOTE — Telephone Encounter (Signed)
Patient is calling to talk with Dr. Vickey Huger or her assistant.  She is calling in regard to a scheduling problem with [facility] for her imaging. Please give her a call back at [phone].

## 2013-04-23 ENCOUNTER — Ambulatory Visit (INDEPENDENT_AMBULATORY_CARE_PROVIDER_SITE_OTHER): Payer: BC Managed Care – PPO | Admitting: Neurology

## 2013-04-23 DIAGNOSIS — G4710 Hypersomnia, unspecified: Secondary | ICD-10-CM

## 2013-04-23 NOTE — Telephone Encounter (Signed)
I spoke to patient and told her we don't need to have the CT done yet, because we have to see what her HLA typing blood sample shows.  She came today for her lab draw and it will be sent to [facility] for results.  If test is positive then we will have CT and LP scheduled for hypocretin testing at [facility].

## 2013-04-25 NOTE — Patient Instructions (Signed)
Patient will be notified once results are in.

## 2013-04-25 NOTE — Progress Notes (Signed)
Patient here for blood draw for HLA typing.  Under sterile technique blood drawn from left hand.  Tolerated well.  Band-Aid applied.  Sample sent to [facility] on Tuesday 04-24-13.

## 2013-05-01 ENCOUNTER — Telehealth: Payer: Self-pay | Admitting: *Deleted

## 2013-05-01 NOTE — Telephone Encounter (Signed)
Called patient and let lvm letting her know her lab results are not ready and someone will reach  Her once there done.

## 2013-05-01 NOTE — Telephone Encounter (Signed)
Message copied by Salome Spotted on Tue May 01, 2013 12:52 PM ------      Message from: Philipp Ovens R      Created: Mon Apr 30, 2013  3:34 PM       Pt calling for lab results ------

## 2013-05-03 ENCOUNTER — Telehealth (HOSPITAL_COMMUNITY): Payer: Self-pay | Admitting: Oncology

## 2013-05-03 NOTE — Telephone Encounter (Signed)
FAXED DISABILITY FORM TO [facility] @ [phone]

## 2013-05-15 ENCOUNTER — Telehealth: Payer: Self-pay | Admitting: Neurology

## 2013-05-15 NOTE — Telephone Encounter (Signed)
I spoke to patient and told her that she does not have the HLA variants associated with Narcolepsy.  She is anxious to speak with the doctor about what she can do next.  [phone]

## 2013-05-17 ENCOUNTER — Telehealth: Payer: Self-pay | Admitting: Neurology

## 2013-05-17 NOTE — Telephone Encounter (Signed)
Tried again to reach patient - no success.  Please try to reach her for me.  My suggestion is to have her continue treatment  for depression as a likeley contributor to  Sleep and wake cycle abnormalities.  . There are medications that can help in periods of hypersomnia, but insurance will only cover patients  with an organic sleep  disorder diagnosis.  We have samples of modafinil to try.Marland Kitchen

## 2013-05-22 ENCOUNTER — Telehealth: Payer: Self-pay | Admitting: Neurology

## 2013-05-22 DIAGNOSIS — G4733 Obstructive sleep apnea (adult) (pediatric): Secondary | ICD-10-CM

## 2013-05-22 DIAGNOSIS — G4710 Hypersomnia, unspecified: Secondary | ICD-10-CM

## 2013-05-22 MED ORDER — ARMODAFINIL 250 MG PO TABS: 250.0000 mg | ORAL_TABLET | Freq: Every day | ORAL | Status: DC

## 2013-05-22 NOTE — Telephone Encounter (Signed)
Patient was concernd that her medications ( she relies on Nuvigil ) would not longer be covered without a narcolepsy diagnosis. I will send a prescription to fill and see if pre auth. is required I told her to wait for 5 days .

## 2013-05-29 ENCOUNTER — Encounter: Payer: Self-pay | Admitting: Neurology

## 2013-05-29 ENCOUNTER — Telehealth: Payer: Self-pay | Admitting: Neurology

## 2013-05-29 NOTE — Telephone Encounter (Signed)
I called and spoke with the patient to inform her that Dr. Vickey Huger would like to see her. I informed the patient that Dr. Vickey Huger can see her on 06-01-13@10 :00.

## 2013-05-29 NOTE — Telephone Encounter (Signed)
Pt calling about medication questions and needs Dr. Vickey Huger or her nurse to call her back concerning this matter. Thanks

## 2013-05-31 ENCOUNTER — Encounter: Payer: Self-pay | Admitting: Neurology

## 2013-05-31 NOTE — Telephone Encounter (Signed)
I have also tried to reach patient and asked her to return call.

## 2013-06-01 ENCOUNTER — Ambulatory Visit (INDEPENDENT_AMBULATORY_CARE_PROVIDER_SITE_OTHER): Payer: BC Managed Care – PPO | Admitting: Neurology

## 2013-06-01 ENCOUNTER — Telehealth: Payer: Self-pay | Admitting: Neurology

## 2013-06-01 ENCOUNTER — Encounter: Payer: Self-pay | Admitting: Neurology

## 2013-06-01 VITALS — BP 105/72 | HR 83 | Resp 16 | Ht 64.5 in | Wt 174.0 lb

## 2013-06-01 DIAGNOSIS — G4711 Idiopathic hypersomnia with long sleep time: Secondary | ICD-10-CM

## 2013-06-01 DIAGNOSIS — G4710 Hypersomnia, unspecified: Secondary | ICD-10-CM

## 2013-06-01 DIAGNOSIS — G4730 Sleep apnea, unspecified: Secondary | ICD-10-CM | POA: Insufficient documentation

## 2013-06-01 DIAGNOSIS — G4733 Obstructive sleep apnea (adult) (pediatric): Secondary | ICD-10-CM

## 2013-06-01 MED ORDER — SODIUM OXYBATE 500 MG/ML PO SOLN: 2500.0000 mg | ORAL | Status: DC

## 2013-06-01 MED ORDER — AMPHETAMINE-DEXTROAMPHET ER 15 MG PO CP24: 15.0000 mg | ORAL_CAPSULE | ORAL | Status: DC

## 2013-06-01 NOTE — Telephone Encounter (Signed)
Patient came for appt on 06-01-13.

## 2013-06-01 NOTE — Patient Instructions (Signed)
Sodium Oxybate oral solution What is this medicine? SODIUM OXYBATE (SOE dee um OX i bate) is used to treat excessive sleepiness and cataplexy in patients with narcolepsy. Cataplexy causes a sudden muscle weakness due to a strong emotional response. This medicine is not available in retail pharmacies. Your doctor will enroll you in a program that will provide the drug to you. This medicine may be used for other purposes; ask your health care provider or pharmacist if you have questions. What should I tell my health care provider before I take this medicine? They need to know if you have any of these conditions: -depression, psychosis, or other mood disorders -heart disease or high blood pressure -if you frequently drink alcohol containing beverages -if you have a history of drug or alcohol abuse -liver disease -lung disease or difficulty breathing -seizures -succinic semialdehyde dehydrogenase deficiency -thoughts of suicide -an unusual or allergic reaction to sodium oxybate, other medicines, foods, dyes, or preservatives -pregnant or trying to get pregnant -breast-feeding How should I use this medicine? Take this medicine by mouth. Follow the directions on the prescription label. Take this medicine on an empty stomach, at least 30 minutes before or 2 hours after food. Do not take with food. Do not take your medicine more often than directed. A special MedGuide will be given to you by the pharmacist with each prescription and refill. Be sure to read this information carefully each time. Talk to your pediatrician regarding the use of this medicine in children. Special care may be needed. Overdosage: If you think you have taken too much of this medicine contact a poison control center or emergency room at once. NOTE: This medicine is only for you. Do not share this medicine with others. What if I miss a dose? Skip the missed dose. If it is almost time for your next dose, take only that dose.  Allow at least two and one-half hours between each nightly dose. Do not take double or extra doses. What may interact with this medicine? Do not take this medicine with any of the following medications: -alcohol -barbiturates, like phenobarbital -medicines commonly used for anxiety, sedation or insomnia This medicine may also interact with the following medications: -bupropion -dronabinol or marijuana -medicines for psychosis or severe mood disturbances -muscle relaxants -other stimulants, although these are commonly used with sodium oxybate -prescription pain medicines, including tramadol This list may not describe all possible interactions. Give your health care provider a list of all the medicines, herbs, non-prescription drugs, or dietary supplements you use. Also tell them if you smoke, drink alcohol, or use illegal drugs. Some items may interact with your medicine. What should I watch for while using this medicine? The use of this medicine requires careful supervision. Visit your doctor or health care professional for regular checks on your progress. Do not suddenly stop taking this medicine if you have been taking it for a long time. Withdrawal symptoms may occur. Your doctor or health care professional may need to slowly stop your doses. This medicine may affect your concentration or function. Let your doctor or health care professional know if you have increased sleepiness or confusion during the day. This medicine causes sleep very quickly. You should only take this drug at bedtime, while in bed. Do not drive a car, operate heavy machinery or perform any activities that require mental alertness for at least 6 hours after taking this drug. Use extreme care in any such daily activities until you know how this medicine affects you.  Because alcohol may interfere with this medicine and may cause serious side effects, you must avoid alcohol-containing beverages while on this medicine. Do not  take this medicine along with sleep medicines or other drugs with strong sedative effects, serious side effects may occur. This medicine can be dangerous in overdose. If you take more than prescribed or take it by accident, get emergency medical help right away. What side effects may I notice from receiving this medicine? Side effects that you should report to your doctor or health care professional as soon as possible: -allergic reactions like skin rash, itching or hives, swelling of the face, lips, or tongue -breathing problems -confusion -fast, irregular heartbeat -increased blood pressure, particularly if you already have high blood pressure -memory loss -seizures -sleepwalking -tremors or shaking movements -urinary incontinence Side effects that usually do not require medical attention (report to your doctor or health care professional if they continue or are bothersome): -dizziness -drowsiness -headache -increased urination -nausea, vomiting or stomach upset -unusual dreams This list may not describe all possible side effects. Call your doctor for medical advice about side effects. You may report side effects to FDA at 1-800-FDA-1088. Where should I keep my medicine? Keep out of reach of children. This medicine can be abused. Keep your medicine in a safe place to protect it from theft. Do not share this medicine with anyone. Selling or giving away this medicine is dangerous and against the law. Store at room temperature between 15 and 30 degrees C (59 and 86 degrees F). Flush any unused medicines down the toilet. Do not use the medicine after the expiration date. NOTE: This sheet is a summary. It may not cover all possible information. If you have questions about this medicine, talk to your doctor, pharmacist, or health care provider.  2013, [employer]vier/[employer] Standard. (09/23/2008 10:05:19 AM)

## 2013-06-01 NOTE — Progress Notes (Addendum)
[facility]  Provider:  Dr Vickey Huger Referring Provider: Plotnikov, Georgina Quint, MD Primary Care Physician:  [name], MD  Chief Complaint  Patient presents with  . Follow-up    per Dr Vickey Huger, sleep apnea, rm 10    HPI:  [name] is a 52 y.o. female here as a referral from Dr. Posey Rea  for hypersomnia , CPAP compliance in newly diagnosed Apnea, HST AHi was 20. Titrated to 6 cm water , download reviewed here 03-29-13 at 100% compliance, 30 days.  The patient reports that she has not trouble using the CPAP machine except for difficulties with the smaller nasal pillow slipping into the nostril. She also states that the Provigil has helped her to drive safely and to attend classes in daytime. He takes 250 mg daily, but by lunchtime the effect wears off.  In May her  Epworth sleepiness score was 20 points, her pretest sleepiness score was at 22 points.   She has persistent hypersomnia with only marginal improvement by CPAP, and the residual AHI of 6.4 but could be further reduced. I consider the response to individual not optimal and would like to try this patient on Xyrem. For this I need to test her for narcolepsy by MSLT. - but performed under REM sleep suppressant medication is considered invalid, therefore is the alternative is a CSF study for hypocretin / orexin levels.  We finally found a  Study of HLA narcolepsy factors to be most feasible and the patients labs were send off.  Tested negative for the HLA narcolepsy. However after another 2 months on CPAP with very good compliance at 100% for the last 62 days, her residual AHI is still 6.2 the CPAP is set at 7 cm water and her average sugars a time it is 5 hours and 17 minutes nightly.  Apnea can not be the cause of this remaining sleepiness today and again she endorsed the Epworth sleepiness score at 21 points ,  the fatigue severity scale at 58 points.   The patient herself feels that the medications she is  taking may contribute a great degree fatigue and sleepiness of she does not feel at this time majorly depressed. She is especially worried about her performance in college and how it would affect her job search in the future. She is also quite concerned about driving with this high degree of sleepiness. She has started taking Nuvigil a 250 mg a day and felt only a marginal reduction of her sleepiness, one concern is that the patient still may be narcoleptic without the HLA factors which are not positive for 100% of all narcolepsy patients. DE is deemed not possible to to the medications the patient needs to take daily. Again I consider the apnea no longer the main contributing factor to her daytime sleepiness and did not discuss medication choices to increase her daytime alertness and safety in performing activities of daily living.  Her current sleep habits are as follows, the patient was to bed around 9 PM and rises at 6 AM she feels that she is sleeping through and made get 8- 9 hours of sleep nightly. She would not be considered sleep deprived by any means- she has rather hypersomnia with a prolonged sleep time. She does not endorse a cyclic pattern, as her sleep times has not varied much over the years. Even on modafinil she still fell asleep in the classroom, and in social situations.  XYREM can be used in hypersomnia  patients without  cataplexy. There is a risk of worsening depression symptoms for patients on Xyrem, and I would be a component of a very gentle and slow titration to see if the chief resolution of a lower than average doors of this medication. The patient has reported vivid dreams in the past and this would further point towards a possible narcolepsy-like syndrome. I will first try Adderall XR with her , and give her information about both medications and access to the narcolepsy network information site.       Review of Systems: Out of a complete 14 system review, the patient  complains of only the following symptoms, and all other reviewed systems are negative. Epworth 21 points, husband and son drive for her.  Depression - denies symptoms at this point, but appears psychomotor slowed. Followed  By psychiatrist Dr Donnie Aho . Pain management through Dr Vear Clock.   History   Social History  . Marital Status: Married    Spouse Name: N/A    Number of Children: 3  . Years of Education: college   Occupational History  . disabled     Currently in Nursing School at  [employer]   Social History Main Topics  . Smoking status: Former Games developer  . Smokeless tobacco: Never Used     Comment: quit smoking in 1999  . Alcohol Use: Yes     Comment: one to two glasses of wine every evening  . Drug Use: No  . Sexually Active: Not on file   Other Topics Concern  . Not on file   Social History Narrative  . No narrative on file    Family History  Problem Relation Age of Onset  . Cancer Mother     breast  . Cancer Sister     breast  . Cancer Maternal Aunt     breast    Past Medical History  Diagnosis Date  . Anxiety   . Depression, major     suicidal thoughts -implants, mental health, 05/11  . Fibromyalgia   . Breast reconstruction deformity   . Hypersomnia     sleep attacks  . Hypothyroidism   . Headache(784.0)   . Migraine headache   . Neoplasm     malignant breast, history of status post bilateral  . Breast cancer     rt breast  and left breast breast removed  . Sleep apnea with use of continuous positive airway pressure (CPAP)     AHI 20 in HST, titration to 6 cm water - residual AHi 6 . remaining hypersomnic.     Past Surgical History  Procedure Laterality Date  . Hysterestomy  2005  . Carpal tunnel release  2011    both hands  . Wisdom teeth out    . Btl    . Breast reconstructioin    . Colonoscopy w/ polypectomy  4/03    tubulovillous adenoma  . Post- a -cath  2008  . Mastectomy Bilateral 2008    Current Outpatient Prescriptions   Medication Sig Dispense Refill  . ARIPiprazole (ABILIFY) 2 MG tablet Take 2 mg by mouth daily.        . Armodafinil (NUVIGIL) 250 MG tablet Take 1 tablet (250 mg total) by mouth daily.  30 tablet  5  . benztropine (COGENTIN) 0.5 MG tablet Take 0.5 mg by mouth at bedtime as needed and may repeat dose one time if needed.        . citalopram (CELEXA) 40 MG tablet Take 40 mg by  mouth daily.        . Flavocoxid (LIMBREL) 500 MG CAPS Take 500 mg by mouth 2 (two) times daily.      Marland Kitchen gabapentin (NEURONTIN) 300 MG capsule Take 600 mg by mouth 2 (two) times daily.       Marland Kitchen HYDROcodone-acetaminophen (VICODIN) 5-500 MG per tablet Take 1 tablet by mouth every 6 (six) hours as needed.      . Multiple Vitamins-Minerals (MULTIVITAMIN WITH MINERALS) tablet Take 1 tablet by mouth daily.      . Nutritional Supplements (MELATONIN PO) Take by mouth Nightly.        . propranolol (INDERAL) 20 MG tablet Take 20 mg by mouth 3 (three) times daily. Patient is taking an extended release, not sure of dosage.      . Tapentadol HCl (NUCYNTA PO) Take 200 mg by mouth 2 (two) times daily.        No current facility-administered medications for this visit.    Allergies as of 06/01/2013 - Review Complete 06/01/2013  Allergen Reaction Noted  . Sulfonamide derivatives Hives     Vitals: BP 105/72  Pulse 83  Resp 16  Ht 5' 4.5" (1.638 m)  Wt 174 lb (78.926 kg)  BMI 29.42 kg/m2 Last Weight:  Wt Readings from Last 1 Encounters:  06/01/13 174 lb (78.926 kg)   Last Height:   Ht Readings from Last 1 Encounters:  06/01/13 5' 4.5" (1.638 m)   Vision Screening:  Left eye with correction .  Right eye with correction 20-25.  Physical exam:  General: The patient is awake, alert and appears not in acute distress. The patient is well groomed. Head: Normocephalic, atraumatic. Neck is supple.Cardiovascular:  Regular rate and rhythm, without  murmurs or carotid bruit, and without distended neck veins. Respiratory: Lungs are  clear to auscultation. Skin:  Without evidence of edema, or rash Trunk: BMI is elevated and patient  has normal posture.  Neurologic exam : The patient is awake and alert, oriented to place and time.  Memory subjective  described as intact. There is a normal attention span & concentration ability. Speech is fluent , monotonous. . Mood and affect are appropriate.  Cranial nerves: Pupils are equal and briskly reactive to light. Funduscopic exam without  evidence of pallor or edema. Extraocular movements  in vertical and horizontal planes intact and without nystagmus. Visual fields by finger perimetry are intact. Hearing to finger rub intact.  Facial sensation intact to fine touch. Facial motor strength is symmetric and tongue and uvula move midline.  Motor exam:   Normal tone and normal muscle bulk and symmetric normal strength in all extremities.  Sensory:  Fine touch, pinprick and vibration were tested in all extremities. Coordination: Rapid alternating movements in the fingers/hands is tested and normal. Gait and station: Patient walks without assistive device   Assessment:  After physical and neurologic examination, review of laboratory studies, imaging, neurophysiology testing and pre-existing records, assessment will be reviewed on the problem list.  Persistent hypersomnia with treated apnea, AHi 6.3 .  History of depression .   Plan:  Treatment plan and additional workup will be reviewed under Problem List.            Depression score attached.

## 2013-06-01 NOTE — Addendum Note (Signed)
Addended by: Melvyn Novas on: 06/01/2013 11:23 AM   Modules accepted: Orders, Medications

## 2013-06-01 NOTE — Telephone Encounter (Signed)
Patient had an appointment today 06-01-13.

## 2013-06-01 NOTE — Telephone Encounter (Signed)
I spoke to Dr. Anne Hahn and he said it was ok to take both medications.  I also spoke to [place] and she said the Adderall XR may need a PA before being filled.  I left message for patient about this and will check with Dr. Vickey Huger next week if she does want the patient on Nuvigil and Adderall at the same time.

## 2013-06-04 ENCOUNTER — Telehealth: Payer: Self-pay | Admitting: Neurology

## 2013-06-04 NOTE — Telephone Encounter (Signed)
Please call. I would like to see if the patient could  nuvigil instead of adderall, but the concomitant use is allowed.  Its mainly to reduce the overall intake dose and allow functioning without sleepiness.

## 2013-06-04 NOTE — Telephone Encounter (Signed)
Done

## 2013-06-05 ENCOUNTER — Telehealth: Payer: Self-pay | Admitting: Neurology

## 2013-06-05 NOTE — Telephone Encounter (Signed)
[name] is allowed to use a stimulant and modafinil together. In light of her very high sleepiness score while  being compliant with sleep apnea treatment I wanted to first try a stimulant before I would consider Xyrem. Assessment is that for persistent and excessive daytime hypersomnolence by being treated for obstructive sleep apnea in an optimal fashion. The sleep apnea therefore is not longer the cost of her sleepiness. I spoke to [place] that yesterday and explained that these medications may be used together, yes.  the patient is clinically depressed, too.  I spoke to the patient today and she has taken Adderall and modafinil together and finally feels alert and able to concentrate and has a productive day at work. She has no side effects. I will continue her on the current regimen.

## 2013-06-06 NOTE — Telephone Encounter (Signed)
Dr. Vickey Huger has spoken to patient, please see phone note dated 06-05-13.

## 2013-06-09 ENCOUNTER — Encounter: Payer: Self-pay | Admitting: Neurology

## 2013-06-15 ENCOUNTER — Ambulatory Visit (HOSPITAL_COMMUNITY): Payer: BC Managed Care – PPO

## 2013-06-15 ENCOUNTER — Telehealth: Payer: Self-pay | Admitting: Neurology

## 2013-06-22 ENCOUNTER — Encounter (HOSPITAL_COMMUNITY): Payer: Self-pay

## 2013-06-27 ENCOUNTER — Telehealth: Payer: Self-pay | Admitting: Neurology

## 2013-06-27 DIAGNOSIS — G4733 Obstructive sleep apnea (adult) (pediatric): Secondary | ICD-10-CM

## 2013-06-27 DIAGNOSIS — G4710 Hypersomnia, unspecified: Secondary | ICD-10-CM

## 2013-06-27 DIAGNOSIS — G4711 Idiopathic hypersomnia with long sleep time: Secondary | ICD-10-CM

## 2013-06-27 MED ORDER — AMPHETAMINE-DEXTROAMPHET ER 15 MG PO CP24: 15.0000 mg | ORAL_CAPSULE | ORAL | Status: DC

## 2013-06-27 NOTE — Telephone Encounter (Signed)
She was diagnosed with Narcolepsy, she was given Adderall since summer, yesterday, while she was driving, she was struggling driving back home because sleepiness.  You have appt with Dr. Vickey Huger in Sept 10th 2014, She wants Dr. Algis Downs to call her when she comes back.  I have refilled her Adderall xr today.

## 2013-06-27 NOTE — Telephone Encounter (Addendum)
spoke with patient and informed that Dr D was not in the office and she wanted to let her know that she has an episode that occured while driving home from [place] to [place](recently diagnosed w/ Narcolepsy). She would like to speak with a doctor about this , is afraid to drive but she has just started school and has to.  Refilled adderall - needs to take it before driving , about 30 minutes .

## 2013-06-27 NOTE — Telephone Encounter (Addendum)
done

## 2013-06-29 ENCOUNTER — Ambulatory Visit: Payer: BC Managed Care – PPO | Admitting: Neurology

## 2013-07-04 ENCOUNTER — Ambulatory Visit: Payer: BC Managed Care – PPO | Admitting: Neurology

## 2013-07-16 MED ORDER — AMPHETAMINE-DEXTROAMPHET ER 15 MG PO CP24: 15.0000 mg | ORAL_CAPSULE | ORAL | Status: DC

## 2013-07-18 ENCOUNTER — Ambulatory Visit (INDEPENDENT_AMBULATORY_CARE_PROVIDER_SITE_OTHER): Payer: BC Managed Care – PPO | Admitting: Neurology

## 2013-07-18 ENCOUNTER — Telehealth: Payer: Self-pay

## 2013-07-18 ENCOUNTER — Encounter: Payer: Self-pay | Admitting: Neurology

## 2013-07-18 VITALS — BP 108/76 | HR 71 | Resp 15 | Ht 64.5 in | Wt 174.0 lb

## 2013-07-18 DIAGNOSIS — G4710 Hypersomnia, unspecified: Secondary | ICD-10-CM

## 2013-07-18 DIAGNOSIS — Z5181 Encounter for therapeutic drug level monitoring: Secondary | ICD-10-CM

## 2013-07-18 DIAGNOSIS — G4733 Obstructive sleep apnea (adult) (pediatric): Secondary | ICD-10-CM

## 2013-07-18 DIAGNOSIS — G4730 Sleep apnea, unspecified: Secondary | ICD-10-CM

## 2013-07-18 LAB — COMPREHENSIVE METABOLIC PANEL
Albumin/Globulin Ratio: 2.3 (ref 1.1–2.5)
Albumin: 4.6 g/dL (ref 3.5–5.5)
Alkaline Phosphatase: 115 IU/L (ref 39–117)
BUN/Creatinine Ratio: 14 (ref 9–23)
BUN: 9 mg/dL (ref 6–24)
Chloride: 91 mmol/L — ABNORMAL LOW (ref 96–108)
GFR calc Af Amer: 118 mL/min/{1.73_m2} (ref >59)
GFR calc non Af Amer: 103 mL/min/{1.73_m2} (ref >59)
Globulin, Total: 2 g/dL (ref 1.5–4.5)
Total Bilirubin: 0.3 mg/dL (ref 0.0–1.2)

## 2013-07-18 MED ORDER — AMPHETAMINE-DEXTROAMPHETAMINE 15 MG PO TABS: 15.0000 mg | ORAL_TABLET | Freq: Every day | ORAL | Status: DC

## 2013-07-18 MED ORDER — SODIUM OXYBATE 500 MG/ML PO SOLN: 4500.0000 mg | ORAL | Status: DC

## 2013-07-18 NOTE — Progress Notes (Signed)
[facility]  Provider:  Melvyn Novas, M D  Referring Provider: Plotnikov, Georgina Quint, MD Primary Care Physician:  [name], MD  Chief Complaint  Patient presents with  . Follow-up    3 mos,rm 10    HPI:  [name] is a 52 y.o. female  Is seen here as a referral/ revisit  from Dr. Posey Rea for excessive daytime sleepiness this review was asleep consultation from 06-01-2013. The patient has had some benefit from Provigil but needs still out of well in daytime to help her operate especially a car safely. She has tested negative for the HLA related genetic narcolepsy, but she remains excessively daytime sleepy after 100 days of CPAP he was as high compliance and reduction of the AHI to single digits 6.2 on average. Her Epworth today was 19 points last visit 21 points of fatigue severity scale is endorsed 57 point which is only marginal reduced last visit.   I am tempted to treat the patient with XYREM  in light of persitent hypersomnia in a situation with CPAP controlled obstructive sleep apnea.  But XYREM  can worsen depression and isolated cases, and it should not be used with narcotic pain medications. She relates to me today that she has not used hydrocodone acetaminophen that was prescribed for when necessary use and therefore considers his health not needing the medication. I think it would be worthwhile to give Xyrem a trial in this patient. Given that she remains on antidepressant medications and more lactic atypical medications are a feel that a  MSLT  would still be invalid.  The patient reports that also will she is not apprehensive about using the CPAP she has at times lost the mask and wonders if that could be a better fit. She has reported no acting out of dreams. She has sometimes just briefly fallen asleep and awoke with a body performing rhythmic autonomic movements that she had not been aware about, that would /could not be incorporated in her  dream. The patient reports it is hard for her to wake up and goal in the morning at times, but she does not report sleep paralysis per se. She does have a high REM pressure by history and reports that even after being just briefly asleep she may already dream. Her dreams of visit. She had some dream hallucinations, hypnopompic.   Sig times have not changed since her last visit again her sleep times has not varied much over the years at all and there seems to be not cyclic pattern to her hypersomnia. She still fell asleep in the classroom in social situations and once at a red light,  on modafinil and even now on Adderall .   Review of Systems: Out of a complete 14 system review, the patient complains of only the following symptoms, and all other reviewed systems are negative. Epworth 19,FSS 57, fall risk 5 points, depression score GDS , 6 points. Fibromyalgia.   History   Social History  . Marital Status: Married    Spouse Name: N/A    Number of Children: 3  . Years of Education: college   Occupational History  . disabled     Currently in Nursing School at  [employer]   Social History Main Topics  . Smoking status: Former Games developer  . Smokeless tobacco: Never Used     Comment: quit smoking in 1999  . Alcohol Use: Yes     Comment: one to two glasses of wine every  evening  . Drug Use: No  . Sexual Activity: Not on file   Other Topics Concern  . Not on file   Social History Narrative  . No narrative on file    Family History  Problem Relation Age of Onset  . Cancer Mother     breast  . Cancer Sister     breast  . Cancer Maternal Aunt     breast    Past Medical History  Diagnosis Date  . Anxiety   . Depression, major     suicidal thoughts -implants, mental health, 05/11  . Fibromyalgia   . Breast reconstruction deformity   . Hypersomnia     sleep attacks  . Hypothyroidism   . Headache(784.0)   . Migraine headache   . Neoplasm     malignant breast, history of status  post bilateral  . Breast cancer     rt breast  and left breast breast removed  . Sleep apnea with use of continuous positive airway pressure (CPAP)     AHI 20 in HST, titration to 6 cm water - residual AHi 6 . remaining hypersomnic.     Past Surgical History  Procedure Laterality Date  . Hysterestomy  2005  . Carpal tunnel release  2011    both hands  . Wisdom teeth out    . Btl    . Breast reconstructioin    . Colonoscopy w/ polypectomy  4/03    tubulovillous adenoma  . Post- a -cath  2008  . Mastectomy Bilateral 2008    Current Outpatient Prescriptions  Medication Sig Dispense Refill  . amphetamine-dextroamphetamine (ADDERALL XR) 15 MG 24 hr capsule Take 1 capsule (15 mg total) by mouth every morning.  30 capsule  0  . ARIPiprazole (ABILIFY) 2 MG tablet Take 2 mg by mouth daily.        . Armodafinil (NUVIGIL) 250 MG tablet Take 1 tablet (250 mg total) by mouth daily.  30 tablet  5  . benztropine (COGENTIN) 0.5 MG tablet Take 0.5 mg by mouth at bedtime as needed and may repeat dose one time if needed.        . citalopram (CELEXA) 40 MG tablet Take 40 mg by mouth daily.        . Flavocoxid (LIMBREL) 500 MG CAPS Take 500 mg by mouth 2 (two) times daily.      Marland Kitchen gabapentin (NEURONTIN) 300 MG capsule Take 600 mg by mouth 2 (two) times daily.       . Multiple Vitamins-Minerals (MULTIVITAMIN WITH MINERALS) tablet Take 1 tablet by mouth daily.      . Nutritional Supplements (MELATONIN PO) Take by mouth Nightly.        . propranolol (INDERAL) 20 MG tablet Take 20 mg by mouth 3 (three) times daily. Patient is taking an extended release, not sure of dosage.      . propranolol ER (INDERAL LA) 80 MG 24 hr capsule       . Tapentadol HCl (NUCYNTA PO) Take 200 mg by mouth 2 (two) times daily.        No current facility-administered medications for this visit.    Allergies as of 07/18/2013 - Review Complete 07/18/2013  Allergen Reaction Noted  . Sulfonamide derivatives Hives      Vitals: BP 108/76  Pulse 71  Resp 15  Ht 5' 4.5" (1.638 m)  Wt 174 lb (78.926 kg)  BMI 29.42 kg/m2 Last Weight:  Wt Readings from Last 1 Encounters:  07/18/13 174 lb (78.926 kg)   Last Height:   Ht Readings from Last 1 Encounters:  07/18/13 5' 4.5" (1.638 m)   Vision Screening: Left eye with correction . Right eye with correction 20-25.  Physical exam:  General: The patient is awake, alert and appears not in acute distress. The patient is well groomed.  Head: Normocephalic, atraumatic. Neck is supple.Cardiovascular: Regular rate and rhythm, without murmurs or carotid bruit, and without distended neck veins.  Respiratory: Lungs are clear to auscultation.  Skin: Without evidence of edema, or rash  Trunk: BMI is elevated and patient has normal posture.  Neurologic exam :  The patient is awake and alert, oriented to place and time. Memory subjective described as intact. There is a normal attention span & concentration ability. Speech is fluent , monotonous. . Mood and affect are today appropriate.  Cranial nerves:  Pupils are equal and briskly reactive to light. Funduscopic exam without evidence of pallor or edema. Extraocular movements in vertical and horizontal planes intact and without nystagmus. Visual fields by finger perimetry are intact.  Hearing to finger rub intact. Facial sensation intact to fine touch. Facial motor strength is symmetric and tongue and uvula move midline.  Motor exam: Normal tone and normal muscle bulk and symmetric normal strength in all extremities.  Sensory: Fine touch, pinprick and vibration were tested in all extremities.  Coordination: Rapid alternating movements in the fingers/hands is tested and normal. Gait and station: Patient walks without assistive device    Assessment: After physical and neurologic examination, review of laboratory studies, imaging, neurophysiology testing and pre-existing records, assessment will be reviewed on the problem  list.  Persistent hypersomnia with treated apnea, AHi 6.3 .  History of major depression .   Plan: Treatment plan for OSA-continue CPAP .  patient's AHI was reduced to 6.1 from 20 i HST and from 10.2 in PSG ,  Where her  RDI had been 12 .  She is 100% ( 98 days )compliant over the last 100 days, averaged 5 hours and 15 minutes.   This is a 50% reduction in AHI , but no related increase in daytime alertness was noted.  Hypersomnia - Epworth remained at 19 points and the patient needs Adderall or other stimulants to allow her to drive and operate machinery.   Mrs. Bezek's  sleep study from 01-31-13 was performed while she endorsed the Epworth score at 23 points and the beck's inventory for depression at 26 points, her BMI at that time was 30.6.  She had been on Nuvigil prn . And had weaned off for the test-  Adderall was only started after the test.  At her baseline , the AHI was 11.7 RDI was 12.1 supine AHI was 27.6 -lowest oxygen saturation was 86%.  There was no tachybradycardia arrhythmia. CPAP is set at 7 cm of water and the patient is comfortable with her mask.  XYREM ordered.

## 2013-07-18 NOTE — Telephone Encounter (Signed)
I called patient to let her know her letter for Jury Duty will be available tomorrow after noon.   Patient wanted me to let Dr. Vickey Huger know the script for Adderal has to be renewed in 30 days.

## 2013-07-18 NOTE — Assessment & Plan Note (Signed)
[name]

## 2013-07-23 ENCOUNTER — Telehealth: Payer: Self-pay | Admitting: *Deleted

## 2013-07-23 NOTE — Telephone Encounter (Signed)
During the prior auth process, I need a voucher for 30 days of Xyrem faxed to [facility]. (Contact:Tacia) fax: [phone] (phone) [phone]. Please call when you have done this or have questions.

## 2013-07-23 NOTE — Telephone Encounter (Signed)
Info has been faxed.  They will review the patient's file and ins info to ensure she is eligible for the program offer and they will contact patient regarding this.

## 2013-07-27 ENCOUNTER — Telehealth: Payer: Self-pay | Admitting: Neurology

## 2013-07-27 NOTE — Telephone Encounter (Signed)
Raven a pharmacist with Xyrem success Program left message that they were trying to schedule the first shipment but they want to be sure the doctor is aware that the patient takes Nucynta which is contraindicated with Xyrem.  She also takes Albilify and Neurontin which can possibly increase CNS depression, so all three can interact with the Xyrem.  They are calling to get the approval to continue with the Xyrem as present with these drug interactions.  Their number is [phone] option 8.

## 2013-07-27 NOTE — Telephone Encounter (Signed)
Patient is scheduled to get first month supply of Xyrem at no charge through a one month trial voucher.  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

## 2013-07-30 ENCOUNTER — Telehealth: Payer: Self-pay | Admitting: Neurology

## 2013-07-30 NOTE — Telephone Encounter (Signed)
Spoke with [facility] [place] pharmacist - NUCYNTA is a resp. Suppressing agent and cannot be given with XYREM, while the Neuronin and Abilify have just a caution advise. I spoke to patient and let her know, that the NUCYNTA prescribing MD may need to be aware of the limitations in treatment for her hypersomnia. Kimothy Kishimoto, MD

## 2013-07-31 ENCOUNTER — Telehealth: Payer: Self-pay

## 2013-07-31 DIAGNOSIS — G4710 Hypersomnia, unspecified: Secondary | ICD-10-CM

## 2013-07-31 MED ORDER — AMPHETAMINE-DEXTROAMPHETAMINE 20 MG PO TABS: 20.0000 mg | ORAL_TABLET | Freq: Every day | ORAL | Status: DC

## 2013-07-31 NOTE — Telephone Encounter (Signed)
Patient is aware Adderall XR Rx is ready for pick up.  She would like to get the regular Adderall Rx tomorrow, although it's a bit early, she does not want to fill it yet, just wants the Rx in hand so she does not have to make another trip here to get it.  As well, she would like to know if she should be taking just XR, just regular or both types of Adderall at the same time.  Please advise.  Thank you.

## 2013-07-31 NOTE — Telephone Encounter (Signed)
[name] :  Please call -Prescription is ready to pick up- patient will not use adderall IR with the adderall  XR,  unless she needs another dose in early afternoon.  She has enough immediate release tabs to see what she needs and call us back.

## 2013-08-02 NOTE — Telephone Encounter (Signed)
I called and left VM for patient reminder that prescription is ready for pick up at [facility].

## 2013-08-31 ENCOUNTER — Telehealth: Payer: Self-pay | Admitting: Neurology

## 2013-08-31 ENCOUNTER — Encounter: Payer: Self-pay | Admitting: Neurology

## 2013-08-31 NOTE — Telephone Encounter (Signed)
Pt call about medication refill of adderall 20 mg tablet. Pt wanted to talk with Dr. Vickey Huger about changing the strength of the medication because pt states that she is having problems with falling asleep and with driving. She would like to talk with the Dr. Before refilling the medication. Please call pt at [phone]

## 2013-09-07 DIAGNOSIS — Z0289 Encounter for other administrative examinations: Secondary | ICD-10-CM

## 2013-09-17 ENCOUNTER — Telehealth: Payer: Self-pay | Admitting: Neurology

## 2013-09-17 ENCOUNTER — Encounter: Payer: Self-pay | Admitting: Neurology

## 2013-11-13 ENCOUNTER — Encounter: Payer: Self-pay | Admitting: Internal Medicine

## 2013-11-21 ENCOUNTER — Telehealth: Payer: Self-pay

## 2013-11-21 NOTE — Telephone Encounter (Signed)
Patient states she was not able to start on the Xyrem because her insurance would not cover it.  Patient's Psychiatrist Chapman Moss has started her on Ritalin.  Please advise if the appointment needs to be changed to Dr. Brett Fairy.  Per Hoyle Sauer it will be  ok for her to  see the patient.

## 2013-11-27 ENCOUNTER — Encounter (INDEPENDENT_AMBULATORY_CARE_PROVIDER_SITE_OTHER): Payer: Self-pay

## 2013-11-27 ENCOUNTER — Telehealth: Payer: Self-pay | Admitting: Nurse Practitioner

## 2013-11-27 ENCOUNTER — Encounter: Payer: Self-pay | Admitting: Nurse Practitioner

## 2013-11-27 ENCOUNTER — Ambulatory Visit (INDEPENDENT_AMBULATORY_CARE_PROVIDER_SITE_OTHER): Payer: BC Managed Care – PPO | Admitting: Nurse Practitioner

## 2013-11-27 VITALS — BP 126/79 | HR 65 | Ht 64.49 in | Wt 175.0 lb

## 2013-11-27 DIAGNOSIS — G4710 Hypersomnia, unspecified: Secondary | ICD-10-CM

## 2013-11-27 DIAGNOSIS — G4730 Sleep apnea, unspecified: Secondary | ICD-10-CM

## 2013-11-27 DIAGNOSIS — G4733 Obstructive sleep apnea (adult) (pediatric): Secondary | ICD-10-CM

## 2013-11-27 NOTE — Telephone Encounter (Signed)
Please let patient know that Chapman Moss was not in the office today, I will contact her tommorrow

## 2013-11-27 NOTE — Progress Notes (Signed)
[facility]  PATIENT: [name] DOB: [birth year]   REASON FOR VISIT: follow up for persistent hypersomnia    HISTORY OF PRESENT ILLNESS:[name], 53 year old returns for followup. She has a history of excessive daytime sleepiness. She had some benefit from from Provigil but still had problems with sleepiness. She is using CPAP and was 100% compliant at last check. Her Epworth remains high at 23 today. Her depression worsened on Adderall and her psychiatrist switched her to Ritalin which she has only been taking less than a week. She is asking for an increase in the dose. She is also on Nuvigil 250 every day. She reports that she has had one  episode since being on the Ritalin  when she fell asleep standing up and caught  herself as she was falling. Dr. Brett Fairy had wanted to put her on Xyrem but her insurance would not cover the medication. She returns for reevaluation    HISTORY: excessive daytime sleepiness this review was asleep consultation from 06-01-2013. The patient has had some benefit from Provigil but needs still out of well in daytime to help her operate especially a car safely. She has tested negative for the HLA related genetic narcolepsy, but she remains excessively daytime sleepy after 100 days of CPAP he was as high compliance and reduction of the AHI to single digits 6.2 on average. Her Epworth today was 19 points last visit 21 points of fatigue severity scale is endorsed 57 point which is only marginal reduced last visit.  I am tempted to treat the patient with XYREM in light of persitent hypersomnia in a situation with CPAP controlled obstructive sleep apnea.  But XYREM can worsen depression and isolated cases, and it should not be used with narcotic pain medications. She relates to me today that she has not used hydrocodone acetaminophen that was prescribed for when necessary use and therefore considers his health not needing the medication. I think it  would be worthwhile to give Xyrem a trial in this patient. Given that she remains on antidepressant medications and more lactic atypical medications are a feel that a MSLT would still be invalid.  The patient reports that also will she is not apprehensive about using the CPAP she has at times lost the mask and wonders if that could be a better fit. She has reported no acting out of dreams. She has sometimes just briefly fallen asleep and awoke with a body performing rhythmic autonomic movements that she had not been aware about, that would /could not be incorporated in her dream.  The patient reports it is hard for her to wake up and goal in the morning at times, but she does not report sleep paralysis per se. She does have a high REM pressure by history and reports that even after being just briefly asleep she may already dream. Her dreams of visit. She had some dream hallucinations, hypnopompic.  Sig times have not changed since her last visit again her sleep times has not varied much over the years at all and there seems to be not cyclic pattern to her hypersomnia. She still fell asleep in the classroom in social situations and once at a red light, on modafinil and even now on Adderall .   REVIEW OF SYSTEMS: Full 14 system review of systems performed and notable only for those listed, all others are neg:  Constitutional: N/A  Cardiovascular: N/A  Ear/Nose/Throat: N/A  Skin: N/A  Eyes: N/A  Respiratory: N/A  Gastroitestinal: N/A  Hematology/Lymphatic: N/A  Endocrine: N/A Musculoskeletal:walking difficulty Allergy/Immunology: N/A  Neurological: memory loss, dizziness Psychiatric: agitation, confusion, depression, anxiety Sleep daytime sleepiness, apnea ALLERGIES: Allergies  Allergen Reactions  . Sulfonamide Derivatives Hives    fever    HOME MEDICATIONS: Outpatient Prescriptions Prior to Visit  Medication Sig Dispense Refill  . ARIPiprazole (ABILIFY) 2 MG tablet Take 2 mg by mouth  daily.        . Armodafinil (NUVIGIL) 250 MG tablet Take 1 tablet (250 mg total) by mouth daily.  30 tablet  5  . benztropine (COGENTIN) 0.5 MG tablet Take 0.5 mg by mouth at bedtime as needed and may repeat dose one time if needed.        . citalopram (CELEXA) 40 MG tablet Take 40 mg by mouth daily.        Marland Kitchen gabapentin (NEURONTIN) 300 MG capsule Take 600 mg by mouth 2 (two) times daily.       . Multiple Vitamins-Minerals (MULTIVITAMIN WITH MINERALS) tablet Take 1 tablet by mouth daily.      . Nutritional Supplements (MELATONIN PO) Take by mouth Nightly.        . propranolol (INDERAL) 20 MG tablet Take 20 mg by mouth 3 (three) times daily. Patient is taking an extended release, not sure of dosage.      . propranolol ER (INDERAL LA) 80 MG 24 hr capsule       . Tapentadol HCl (NUCYNTA PO) Take 200 mg by mouth 2 (two) times daily.       Marland Kitchen amphetamine-dextroamphetamine (ADDERALL) 20 MG tablet Take 1 tablet (20 mg total) by mouth daily.  30 tablet  0  . Flavocoxid (LIMBREL) 500 MG CAPS Take 500 mg by mouth 2 (two) times daily.       No facility-administered medications prior to visit.    PAST MEDICAL HISTORY: Past Medical History  Diagnosis Date  . Anxiety   . Depression, major     suicidal thoughts -implants, mental health, 05/11  . Fibromyalgia   . Breast reconstruction deformity   . Hypersomnia     sleep attacks  . Hypothyroidism   . Headache(784.0)   . Migraine headache   . Neoplasm     malignant breast, history of status post bilateral  . Breast cancer     rt breast  and left breast breast removed  . Sleep apnea with use of continuous positive airway pressure (CPAP)     AHI 20 in HST, titration to 6 cm water - residual AHi 6 . remaining hypersomnic.     PAST SURGICAL HISTORY: Past Surgical History  Procedure Laterality Date  . Hysterestomy  2005  . Carpal tunnel release  2011    both hands  . Wisdom teeth out    . Btl    . Breast reconstructioin    . Colonoscopy w/  polypectomy  4/03    tubulovillous adenoma  . Post- a -cath  2008  . Mastectomy Bilateral 2008    FAMILY HISTORY: Family History  Problem Relation Age of Onset  . Cancer Mother     breast  . Cancer Sister     breast  . Cancer Maternal Aunt     breast    SOCIAL HISTORY: History   Social History  . Marital Status: Married    Spouse Name: N/A    Number of Children: 3  . Years of Education: college   Occupational History  . disabled     Currently  in [place] at  [employer] Topics  . Smoking status: Former Research scientist (life sciences)  . Smokeless tobacco: Never Used     Comment: quit smoking in 1999  . Alcohol Use: Yes     Comment: one to two glasses of wine every evening  . Drug Use: No  . Sexual Activity: Not on file   Other Topics Concern  . Not on file   Social History Narrative   Patient has 3 children.    Patient is disabled.    Patient has a college education.      PHYSICAL EXAM  Filed Vitals:   11/27/13 1401  BP: 126/79  Pulse: 65  Height: 5' 4.49" (1.638 m)  Weight: 175 lb (79.379 kg)   Body mass index is 29.59 kg/(m^2).  Generalized: Well developed, in no acute distress  Head: normocephalic and atraumatic,. Oropharynx benign  Neck: Supple, no carotid bruits  Cardiac: Regular rate rhythm, no murmur  Musculoskeletal: No deformity   Neurological examination   Mentation: Alert oriented to time, place, history taking. Follows all commands speech and language fluent. ESS 23.   Cranial nerve II-XII: Pupils were equal round reactive to light extraocular movements were full, visual field were full on confrontational test. Facial sensation and strength were normal. hearing was intact to finger rubbing bilaterally. Uvula tongue midline. head turning and shoulder shrug were normal and symmetric.Tongue protrusion into cheek strength was normal. Motor: normal bulk and tone, full strength in the BUE, BLE, fine finger movements normal, no pronator drift.  No focal weakness Sensory: normal and symmetric to light touch, pinprick, and  vibration  Coordination: finger-nose-finger, heel-to-shin bilaterally, no dysmetria Reflexes: Brachioradialis 2/2, biceps 2/2, triceps 2/2, patellar 2/2, Achilles 2/2, plantar responses were flexor bilaterally. Gait and Station: Rising up from seated position without assistance, normal stance,  moderate stride, good arm swing, smooth turning, able to perform tiptoe, and heel walking without difficulty. Tandem gait is unsteady  DIAGNOSTIC DATA (LABS, IMAGING, TESTING) - I reviewed patient records, labs, notes, testing and imaging myself where available.      Component Value Date/Time   NA 131* 07/18/2013 1112   NA 138 10/11/2011 1339   K 4.6 07/18/2013 1112   CL 91* 07/18/2013 1112   CO2 30* 07/18/2013 1112   GLUCOSE 107* 07/18/2013 1112   GLUCOSE 111* 10/11/2011 1339   BUN 9 07/18/2013 1112   BUN 14 10/11/2011 1339   CREATININE 0.63 07/18/2013 1112   CALCIUM 10.0 07/18/2013 1112   PROT 6.6 07/18/2013 1112   PROT 6.6 10/11/2011 1339   ALBUMIN 3.8 10/11/2011 1339   AST 23 07/18/2013 1112   ALT 22 07/18/2013 1112   ALKPHOS 115 07/18/2013 1112   BILITOT 0.3 07/18/2013 1112   GFRNONAA 103 07/18/2013 1112   GFRAA 118 07/18/2013 1112    ASSESSMENT AND PLAN  53 y.o. year old female  has a past medical history of Anxiety; Depression, major; Fibromyalgia; Hypersomnia; Hypothyroidism; Headache(784.0); Migraine headache; Neoplasm; Breast cancer; and Sleep apnea with use of continuous positive airway pressure (CPAP). here  her to followup. She continues to have a lot of daytime drowsiness. She has failed Adderall due to increased depression and Provigil was not effective. Psych recently placed her on Ritalin 10 mg and she is on Nuvigil 250 daily with ESS of 23 Discussed with Dr. Brett Fairy  Continue Ritalin I will contact Chapman Moss at [facility] about changes, she was out of the office today. Continue Nuvigil at  current  dose. Make sure chip is brought in for review from CPAP F/U in 3 months next visit with dr. De Nurse, [facility], [facility], APRN  [facility] [address] [place], [place] [address] [phone]

## 2013-11-27 NOTE — Patient Instructions (Signed)
Continue Ritalin I will contact Robin about changes Continue Nuvigil at current dose. Make sure chip is brought in for review from CPAP F/U in 3 months next visit with dr. Brett Fairy

## 2013-11-28 NOTE — Telephone Encounter (Signed)
TC to patient left message I had talked with [name] at [facility] this morning and she relayed that the Ritalin 10mg  can be taken 3 times daily. Pt had only told me she was taking daily. Will leave at this dose. [name] will reevaluate the medication after 1 month.

## 2013-11-28 NOTE — Progress Notes (Signed)
I agree with the assessment and plan as directed by NP .The patient is known to me .   Lalita Ebel, MD  

## 2013-11-29 ENCOUNTER — Other Ambulatory Visit: Payer: Self-pay | Admitting: Neurology

## 2013-11-30 NOTE — Telephone Encounter (Signed)
Rx signed and faxed.

## 2013-12-04 ENCOUNTER — Encounter: Payer: Self-pay | Admitting: Neurology

## 2014-03-05 ENCOUNTER — Encounter (INDEPENDENT_AMBULATORY_CARE_PROVIDER_SITE_OTHER): Payer: Self-pay

## 2014-03-05 ENCOUNTER — Encounter: Payer: Self-pay | Admitting: Neurology

## 2014-03-05 ENCOUNTER — Ambulatory Visit (INDEPENDENT_AMBULATORY_CARE_PROVIDER_SITE_OTHER): Payer: BC Managed Care – PPO | Admitting: Neurology

## 2014-03-05 VITALS — BP 104/65 | HR 72 | Resp 18 | Ht 64.25 in | Wt 172.0 lb

## 2014-03-05 DIAGNOSIS — G4710 Hypersomnia, unspecified: Secondary | ICD-10-CM

## 2014-03-05 NOTE — Progress Notes (Signed)
[facility]  PATIENT: ADREONNA YONTZ DOB: 17-Sep-1961   REASON FOR VISIT: follow up for persistent hypersomnia    HISTORY OF PRESENT ILLNESS:Ms Swanton, 53 year old returns for followup. She has a history of excessive daytime sleepiness. She had some benefit from from Provigil but still had problems with sleepiness. She is using CPAP and was 100% compliant at last check. Her Epworth remains high at 23 today. Her depression worsened on Adderall and her psychiatrist switched her to Ritalin which she has only been taking less than a week. She is asking for an increase in the dose. She is also on Nuvigil 250 every day. She reports that she has had one  episode since being on the Ritalin  when she fell asleep standing up and caught  herself as she was falling. Dr. Brett Fairy had wanted to put her on Xyrem but her insurance would not cover the medication. She returns for reevaluation    HISTORY: excessive daytime sleepiness this review was asleep consultation7-25-2014. The patient has had some benefit (temporarly)  from Provigil but needs still out of well in daytime to help her operate especially a car safely. She has tested negative for the HLA related genetic narcolepsy, but she remains excessively daytime sleepy after 100 days of CPAP he was as high compliance and reduction of the AHI to single digits 6.2 on average. Her Epworth today was 19 points last visit 21 points of fatigue severity scale is endorsed 57 point which is only marginal reduced last visit. The patient hd endorsed the Olevia Bowens s inventory at 64 points!  I am tempted to treat the patient with XYREM in light of persitent hypersomnia in a situation with CPAP controlled obstructive sleep apnea.  But XYREM can worsen depression and isolated cases, and it should not be used with narcotic pain medications. She relates to me today that she has not used hydrocodone acetaminophen that was prescribed for when necessary use and  therefore considers his health not needing the medication. I think it would be worthwhile to give Xyrem a trial in this patient. Given that she remains on antidepressant medications and more lactic atypical medications are a feel that a MSLT would still be invalid.  HLA testing was performed, and negative.  Mrs. Severns brought her machine today to our office visit;  she had used it 100% of the 30 day period ending today  03-05-14.  Duration of  CPAP therapy at 6 hours 2 minutes, residual AHI is 5.9,  CPAP pressure set at 7 cm water. Baseline study AHI was 11.7 and RDI was 12.1 in supine sleep 27.6. Because of the morning pain, but I attributed to plantar fasciitis, she seems to have more problems in the morning to start her day.  Again today's Epworth sleepiness score was 19 points and her fatigue severity score was still very high endorsed at 61 point. She feels no cognitive improvement.  Her sleepiness reportedly  is due " to fits at night". She doesn't know when she will fall asleep, has sleep attacks, is difficult to arouse. Nocturnal sleep time is still 8 hours, less vivid dreams since last visist.     REVIEW OF SYSTEMS: Full 14 system review of systems performed and notable only for those listed, all others are neg:  Constitutional: N/A  Cardiovascular: N/A  Ear/Nose/Throat: N/A  Skin: N/A  Eyes: N/A  Respiratory: N/A  Gastroitestinal: N/A  Hematology/Lymphatic: N/A  Endocrine:  Weight gain.  Musculoskeletal:walking difficulty Allergy/Immunology: N/A  Neurological: memory loss, , Memory loss  Psychiatric: agitation, confusion, depression, anxiety- desoriented, delusional. Has reported sleep attacks, immediataly dreaming and acting out dreams.  Sleep daytime sleepiness, apnea ALLERGIES: Allergies  Allergen Reactions  . Sulfonamide Derivatives Hives    fever    HOME MEDICATIONS: Outpatient Prescriptions Prior to Visit  Medication Sig Dispense Refill  . ARIPiprazole (ABILIFY) 2  MG tablet Take 2 mg by mouth daily.        . benztropine (COGENTIN) 0.5 MG tablet Take 0.5 mg by mouth at bedtime as needed and may repeat dose one time if needed.        . citalopram (CELEXA) 40 MG tablet Take 40 mg by mouth daily.        Marland Kitchen gabapentin (NEURONTIN) 300 MG capsule Take 600 mg by mouth 2 (two) times daily.       . Multiple Vitamins-Minerals (MULTIVITAMIN WITH MINERALS) tablet Take 1 tablet by mouth daily.      . naproxen (NAPROSYN) 500 MG tablet as needed.      . Nutritional Supplements (MELATONIN PO) Take by mouth Nightly.        Marland Kitchen NUVIGIL 250 MG tablet TAKE 1 TABLET BY MOUTH DAILY  30 tablet  5  . propranolol (INDERAL) 20 MG tablet Take 20 mg by mouth. As needed      . propranolol ER (INDERAL LA) 80 MG 24 hr capsule Take 80 mg by mouth 2 (two) times daily.       . Tapentadol HCl (NUCYNTA PO) Take 200 mg by mouth 2 (two) times daily.       Marland Kitchen triamcinolone cream (KENALOG) 0.5 %       . methylphenidate (RITALIN) 10 MG tablet Take 10 mg by mouth 3 (three) times daily with meals.        No facility-administered medications prior to visit.    PAST MEDICAL HISTORY: Past Medical History  Diagnosis Date  . Anxiety   . Depression, major     suicidal thoughts -implants, mental health, 05/11  . Fibromyalgia   . Breast reconstruction deformity   . Hypersomnia     sleep attacks  . Hypothyroidism   . Headache(784.0)   . Migraine headache   . Neoplasm     malignant breast, history of status post bilateral  . Breast cancer     rt breast  and left breast breast removed  . Sleep apnea with use of continuous positive airway pressure (CPAP)     AHI 20 in HST, titration to 6 cm water - residual AHi 6 . remaining hypersomnic.     PAST SURGICAL HISTORY: Past Surgical History  Procedure Laterality Date  . Hysterestomy  2005  . Carpal tunnel release  2011    both hands  . Wisdom teeth out    . Btl    . Breast reconstructioin    . Colonoscopy w/ polypectomy  4/03    tubulovillous  adenoma  . Post- a -cath  2008  . Mastectomy Bilateral 2008    FAMILY HISTORY: Family History  Problem Relation Age of Onset  . Cancer Mother     breast  . Cancer Sister     breast  . Cancer Maternal Aunt     breast    SOCIAL HISTORY: History   Social History  . Marital Status: Married    Spouse Name: Leory Plowman    Number of Children: 3  . Years of Education: college   Occupational History  . disabled  Currently in [place] at  [employer] Topics  . Smoking status: Former Research scientist (life sciences)  . Smokeless tobacco: Never Used     Comment: quit smoking in 1999  . Alcohol Use: Yes     Comment: one  glass of wine every evening  . Drug Use: No  . Sexual Activity: Not on file   Other Topics Concern  . Not on file   Social History Narrative   Patient  Is married Leory Plowman) and lives at home with her husband.   Patient has 3 children.    Patient is disabled.    Patient has a college education.    Patient is right-handed.   Patient drinks one cup of coffee daily and one coke per day.     PHYSICAL EXAM  Filed Vitals:   03/05/14 1347  BP: 104/65  Pulse: 72  Resp: 18  Height: 5' 4.25" (1.632 m)  Weight: 172 lb (78.019 kg)   Body mass index is 29.29 kg/(m^2).  Generalized: Well developed, in no acute distress  Head: normocephalic and atraumatic,. Oropharynx benign . Neck circumference,15.5 inches, no TMJ, less facial expressivity.  Neck: Supple, no carotid bruits  Cardiac: Regular rate rhythm, no murmur.  Musculoskeletal: No deformity   Neurological examination   Mentation: Alert oriented to time, place, history taking. Follows all commands speech and language fluent. ESS 19.   Cranial nerve II-XII: Pupils were equal round reactive to light extraocular movements were full, visual field were full on confrontational test. Facial sensation and strength were normal. hearing was intact to finger rubbing bilaterally. Uvula tongue midline. head turning and  shoulder shrug were normal and symmetric.Tongue protrusion into cheek strength was normal. Motor: normal bulk and tone, full strength in the BUE, BLE, fine finger movements normal, no pronator drift. No focal weakness Sensory: normal and symmetric to light touch, pinprick, and  vibration  Coordination: finger-nose-finger, heel-to-shin bilaterally, no dysmetria Reflexes: Brachioradialis 2/2, biceps 2/2, triceps 2/2, patellar 2/2, Achilles 2/2, plantar responses were flexor bilaterally. Gait and Station: Rising up from seated position without assistance, normal stance,  moderate stride, good arm swing, smooth turning, able to perform tiptoe, and heel walking without difficulty. Tandem gait is unsteady  DIAGNOSTIC DATA (LABS, IMAGING, TESTING) - I reviewed patient records, labs, notes, testing and imaging myself where available.      Component Value Date/Time   NA 131* 07/18/2013 1112   NA 138 10/11/2011 1339   K 4.6 07/18/2013 1112   CL 91* 07/18/2013 1112   CO2 30* 07/18/2013 1112   GLUCOSE 107* 07/18/2013 1112   GLUCOSE 111* 10/11/2011 1339   BUN 9 07/18/2013 1112   BUN 14 10/11/2011 1339   CREATININE 0.63 07/18/2013 1112   CALCIUM 10.0 07/18/2013 1112   PROT 6.6 07/18/2013 1112   PROT 6.6 10/11/2011 1339   ALBUMIN 3.8 10/11/2011 1339   AST 23 07/18/2013 1112   ALT 22 07/18/2013 1112   ALKPHOS 115 07/18/2013 1112   BILITOT 0.3 07/18/2013 1112   GFRNONAA 103 07/18/2013 1112   GFRAA 118 07/18/2013 1112    ASSESSMENT AND PLAN  53 y.o. year old female  has a past medical history of Anxiety; Depression, major; Fibromyalgia; Hypersomnia; Hypothyroidism;  back pain Headache(784.0); Migraine headache; Neoplasm; Breast cancer;Marland Kitchen   Sleep apnea with use of continuous positive airway pressure (CPAP). here  in followup.  She continues to have a lot of daytime drowsiness.  She has failed Adderall  at 10 and 20 mg, due  to increased depression and Provigil / Nuvigil was not effective. Psychiatrist recently  placed her on Ritalin 20 mg and she is not longer on Nuvigil 250 mg  daily ( had an ESS of 23).  She remains depressed and in multifocal pain. Today on Ritalin 20 mg twice a day.  her Epworth is 19 on 03-05-14. Janeth Rase , NP with [facility].  Marland Kitchen  Her memory and concentration is still perceived as poor.   She has a plantar faciitis in both heel , bottom of both feet. Recommend:  Ibuprofen capsule  with apple sauce or a banana at bedtime, 400 mg .     Make sure chip is brought in for review from CPAP- every  12 month sleep clinic visit . sicne reital seems to work for her about 3 hours, she may benefit from a XR form?   F/U in  6-9 months next visit with Dennie Bible, [facility],   Larey Seat, MD  [facility] [address], [place], [place] [address] [phone]

## 2014-03-05 NOTE — Patient Instructions (Signed)
Driving and Equipment Restrictions Some medical problems make it dangerous to drive, ride a bike, or use machines. Some of these problems are:  Passing out (fainting).  Twitching and shaking (seizures).  Low blood sugar.  Taking medicine to help you relax (sedatives).  Taking pain medicines. HOME CARE   Do not drive until your doctor says it is okay.  Do not use machines until your doctor says it is okay. You may need a form signed by your doctor (medical release) before you can drive again. You may also need this form before you do other tasks where you need to be fully alert. MAKE SURE YOU:  Understand these instructions.  Will watch your condition.  Will get help right away if you are not doing well or get worse. Document Released: 12/02/2004 Document Revised: 01/17/2012 Document Reviewed: 03/04/2010 [facility] Patient Information 2014 [place]. Hypersomnia Hypersomnia usually brings recurrent episodes of excessive daytime sleepiness or prolonged nighttime sleep. It is different than feeling tired due to lack of or interrupted sleep at night. People with hypersomnia are compelled to nap repeatedly during the day. This is often at inappropriate times such as:  At work.  During a meal.  In conversation. These daytime naps usually provide no relief. This disorder typically affects adolescents and young adults. CAUSES  This condition may be caused by:  Another sleep disorder (such as narcolepsy or sleep apnea).  Dysfunction of the autonomic nervous system.  Drug or alcohol abuse.  A physical problem, such as:  A tumor.  Head trauma. This is damage caused by an accident.  Injury to the central nervous system.  Certain medications, or medicine withdrawal.  Medical conditions may contribute to the disorder, including:  Multiple sclerosis.  Depression.  Encephalitis.  Epilepsy.  Obesity.  Some people appear to have a genetic predisposition to this  disorder. In others, there is no known cause. SYMPTOMS   Patients often have difficulty waking from a long sleep. They may feel dazed or confused.  Other symptoms may include:  Anxiety.  Increased irritation (inflammation).  Decreased energy.  Restlessness.  Slow thinking.  Slow speech.  Loss of appetite.  Hallucinations.  Memory difficulty.  Tremors, Tics.  Some patients lose the ability to function in family, social, occupational, or other settings. TREATMENT  Treatment is symptomatic in nature. Stimulants and other drugs may be used to treat this disorder. Changes in behavior may help. For example, avoid night work and social activities that delay bed time. Changes in diet may offer some relief. Patients should avoid alcohol and caffeine. PROGNOSIS  The likely outcome (prognosis) for persons with hypersomnia depends on the cause of the disorder. The disorder itself is not life threatening. But it can have serious consequences. For example, automobile accidents can be caused by falling asleep while driving. The attacks usually continue indefinitely. Document Released: 10/15/2002 Document Revised: 01/17/2012 Document Reviewed: 09/18/2008 [facility] Patient Information 2014 [place], [place].

## 2014-04-16 ENCOUNTER — Encounter: Payer: Self-pay | Admitting: Internal Medicine

## 2014-06-07 ENCOUNTER — Other Ambulatory Visit: Payer: Self-pay | Admitting: Orthopedic Surgery

## 2014-06-14 ENCOUNTER — Encounter (HOSPITAL_BASED_OUTPATIENT_CLINIC_OR_DEPARTMENT_OTHER): Payer: Self-pay | Admitting: *Deleted

## 2014-06-14 NOTE — Progress Notes (Signed)
No labs needed

## 2014-06-20 ENCOUNTER — Ambulatory Visit (HOSPITAL_BASED_OUTPATIENT_CLINIC_OR_DEPARTMENT_OTHER)
Admission: RE | Admit: 2014-06-20 | Discharge: 2014-06-20 | Disposition: A | Discharge status: Home / Self Care | Payer: BC Managed Care – PPO | Source: Ambulatory Visit | Attending: Orthopedic Surgery | Admitting: Orthopedic Surgery

## 2014-06-20 ENCOUNTER — Ambulatory Visit (HOSPITAL_BASED_OUTPATIENT_CLINIC_OR_DEPARTMENT_OTHER): Payer: BC Managed Care – PPO | Admitting: Certified Registered"

## 2014-06-20 ENCOUNTER — Encounter (HOSPITAL_BASED_OUTPATIENT_CLINIC_OR_DEPARTMENT_OTHER): Payer: Self-pay | Admitting: Orthopedic Surgery

## 2014-06-20 ENCOUNTER — Encounter (HOSPITAL_BASED_OUTPATIENT_CLINIC_OR_DEPARTMENT_OTHER)
Admission: RE | Disposition: A | Discharge status: Home / Self Care | Payer: Self-pay | Source: Ambulatory Visit | Attending: Orthopedic Surgery

## 2014-06-20 ENCOUNTER — Encounter (HOSPITAL_BASED_OUTPATIENT_CLINIC_OR_DEPARTMENT_OTHER): Payer: BC Managed Care – PPO | Admitting: Certified Registered"

## 2014-06-20 DIAGNOSIS — Z87891 Personal history of nicotine dependence: Secondary | ICD-10-CM | POA: Insufficient documentation

## 2014-06-20 DIAGNOSIS — G4710 Hypersomnia, unspecified: Secondary | ICD-10-CM | POA: Insufficient documentation

## 2014-06-20 DIAGNOSIS — Z853 Personal history of malignant neoplasm of breast: Secondary | ICD-10-CM | POA: Insufficient documentation

## 2014-06-20 DIAGNOSIS — F411 Generalized anxiety disorder: Secondary | ICD-10-CM | POA: Diagnosis not present

## 2014-06-20 DIAGNOSIS — F329 Major depressive disorder, single episode, unspecified: Secondary | ICD-10-CM | POA: Insufficient documentation

## 2014-06-20 DIAGNOSIS — Z9010 Acquired absence of unspecified breast and nipple: Secondary | ICD-10-CM | POA: Insufficient documentation

## 2014-06-20 DIAGNOSIS — G4730 Sleep apnea, unspecified: Secondary | ICD-10-CM

## 2014-06-20 DIAGNOSIS — IMO0001 Reserved for inherently not codable concepts without codable children: Secondary | ICD-10-CM | POA: Diagnosis not present

## 2014-06-20 DIAGNOSIS — E039 Hypothyroidism, unspecified: Secondary | ICD-10-CM | POA: Insufficient documentation

## 2014-06-20 DIAGNOSIS — M19049 Primary osteoarthritis, unspecified hand: Secondary | ICD-10-CM | POA: Insufficient documentation

## 2014-06-20 HISTORY — DX: Other complications of anesthesia, initial encounter: T88.59XA

## 2014-06-20 HISTORY — DX: Adverse effect of unspecified anesthetic, initial encounter: T41.45XA

## 2014-06-20 HISTORY — PX: FINGER ARTHROSCOPY WITH CARPOMETACARPEL (CMC) ARTHROPLASTY: SHX5629

## 2014-06-20 LAB — POCT HEMOGLOBIN-HEMACUE: HEMOGLOBIN: 13.6 g/dL (ref 12.0–15.0)

## 2014-06-20 SURGERY — FINGER ARTHROSCOPY WITH CARPOMETACARPEL (CMC) ARTHROPLASTY
Anesthesia: Regional | Site: Thumb | Laterality: Right

## 2014-06-20 MED ORDER — FENTANYL CITRATE 0.05 MG/ML IJ SOLN
INTRAMUSCULAR | Status: AC
  Filled 2014-06-20: qty 2

## 2014-06-20 MED ORDER — BUPIVACAINE-EPINEPHRINE (PF) 0.5% -1:200000 IJ SOLN
INTRAMUSCULAR | Status: DC | PRN
  Administered 2014-06-20: 30 mL via PERINEURAL

## 2014-06-20 MED ORDER — CHLORHEXIDINE GLUCONATE 4 % EX LIQD: 60.0000 mL | Freq: Once | CUTANEOUS | Status: DC

## 2014-06-20 MED ORDER — LIDOCAINE HCL (CARDIAC) 20 MG/ML IV SOLN
INTRAVENOUS | Status: DC | PRN
  Administered 2014-06-20: 40 mg via INTRAVENOUS

## 2014-06-20 MED ORDER — ONDANSETRON HCL 4 MG/2ML IJ SOLN
INTRAMUSCULAR | Status: DC | PRN
  Administered 2014-06-20: 4 mg via INTRAVENOUS

## 2014-06-20 MED ORDER — MIDAZOLAM HCL 5 MG/5ML IJ SOLN
INTRAMUSCULAR | Status: DC | PRN
  Administered 2014-06-20: 1 mg via INTRAVENOUS

## 2014-06-20 MED ORDER — BUPIVACAINE HCL (PF) 0.5 % IJ SOLN
INTRAMUSCULAR | Status: DC | PRN
  Administered 2014-06-20: 5 mL via PERINEURAL

## 2014-06-20 MED ORDER — MIDAZOLAM HCL 2 MG/2ML IJ SOLN
INTRAMUSCULAR | Status: AC
  Filled 2014-06-20: qty 2

## 2014-06-20 MED ORDER — CEFAZOLIN SODIUM-DEXTROSE 2-3 GM-% IV SOLR
INTRAVENOUS | Status: AC
  Filled 2014-06-20: qty 50

## 2014-06-20 MED ORDER — CEFAZOLIN SODIUM-DEXTROSE 2-3 GM-% IV SOLR
2.0000 g | INTRAVENOUS | Status: DC
Start: 2014-06-21 — End: 2014-06-20

## 2014-06-20 MED ORDER — DEXAMETHASONE SODIUM PHOSPHATE 10 MG/ML IJ SOLN
INTRAMUSCULAR | Status: DC | PRN
  Administered 2014-06-20: 10 mg via INTRAVENOUS

## 2014-06-20 MED ORDER — LACTATED RINGERS IV SOLN
INTRAVENOUS | Status: DC
  Administered 2014-06-20 (×2): via INTRAVENOUS

## 2014-06-20 MED ORDER — MIDAZOLAM HCL 2 MG/2ML IJ SOLN
1.0000 mg | INTRAMUSCULAR | Status: DC | PRN
  Administered 2014-06-20: 2 mg via INTRAVENOUS

## 2014-06-20 MED ORDER — CEFAZOLIN SODIUM-DEXTROSE 2-3 GM-% IV SOLR
2.0000 g | INTRAVENOUS | Status: AC
  Administered 2014-06-20: 2 g via INTRAVENOUS

## 2014-06-20 MED ORDER — HYDROMORPHONE HCL PF 1 MG/ML IJ SOLN: 0.2500 mg | INTRAMUSCULAR | Status: DC | PRN

## 2014-06-20 MED ORDER — FENTANYL CITRATE 0.05 MG/ML IJ SOLN
INTRAMUSCULAR | Status: AC
  Filled 2014-06-20: qty 6

## 2014-06-20 MED ORDER — FENTANYL CITRATE 0.05 MG/ML IJ SOLN
50.0000 ug | INTRAMUSCULAR | Status: DC | PRN
  Administered 2014-06-20: 100 ug via INTRAVENOUS

## 2014-06-20 MED ORDER — OXYCODONE-ACETAMINOPHEN 10-325 MG PO TABS: 1.0000 | ORAL_TABLET | ORAL | Status: DC | PRN

## 2014-06-20 MED ORDER — PROPOFOL 10 MG/ML IV BOLUS
INTRAVENOUS | Status: DC | PRN
  Administered 2014-06-20: 150 mg via INTRAVENOUS

## 2014-06-20 MED ORDER — OXYCODONE HCL 5 MG/5ML PO SOLN: 5.0000 mg | Freq: Once | ORAL | Status: DC | PRN

## 2014-06-20 MED ORDER — OXYCODONE HCL 5 MG PO TABS: 5.0000 mg | ORAL_TABLET | Freq: Once | ORAL | Status: DC | PRN

## 2014-06-20 SURGICAL SUPPLY — 83 items
BLADE CUDA 2.0 (BLADE) IMPLANT
BLADE EAR TYMPAN 2.5 60D BEAV (BLADE) IMPLANT
BLADE SURG 15 STRL LF DISP TIS (BLADE) ×1 IMPLANT
BLADE SURG 15 STRL SS (BLADE) ×3
BNDG CMPR 9X4 STRL LF SNTH (GAUZE/BANDAGES/DRESSINGS)
BNDG COHESIVE 3X5 TAN STRL LF (GAUZE/BANDAGES/DRESSINGS) ×3 IMPLANT
BNDG ESMARK 4X9 LF (GAUZE/BANDAGES/DRESSINGS) IMPLANT
BNDG GAUZE ELAST 4 BULKY (GAUZE/BANDAGES/DRESSINGS) ×3 IMPLANT
BUR CUDA 2.9 (BURR) IMPLANT
BUR CUDA 2.9MM (BURR)
BUR FULL RADIUS 2.0 (BURR) ×2 IMPLANT
BUR FULL RADIUS 2.0MM (BURR) ×1
BUR FULL RADIUS 2.9 (BURR) ×1 IMPLANT
BUR FULL RADIUS 2.9MM (BURR) ×1
BUR GATOR 2.9 (BURR) IMPLANT
BUR GATOR 2.9MM (BURR)
BUR SPHERICAL 2.9 (BURR) IMPLANT
BUR SPHERICAL 2.9MM (BURR)
CANISTER SUCT 1200ML W/VALVE (MISCELLANEOUS) ×4 IMPLANT
CANISTER SUCT 3000ML (MISCELLANEOUS) IMPLANT
CHLORAPREP W/TINT 26ML (MISCELLANEOUS) ×3 IMPLANT
CORDS BIPOLAR (ELECTRODE) IMPLANT
COVER MAYO STAND STRL (DRAPES) ×3 IMPLANT
COVER TABLE BACK 60X90 (DRAPES) ×3 IMPLANT
CUFF TOURNIQUET SINGLE 18IN (TOURNIQUET CUFF) ×3 IMPLANT
DERMASPAN .5-.9MM 4X4CM SHOU (Miscellaneous) ×2 IMPLANT
DRAPE EXTREMITY T 121X128X90 (DRAPE) ×3 IMPLANT
DRAPE OEC MINIVIEW 54X84 (DRAPES) ×3 IMPLANT
DRAPE SURG 17X23 STRL (DRAPES) ×3 IMPLANT
DRAPE U 20/CS (DRAPES) ×3 IMPLANT
GAUZE SPONGE 4X4 12PLY STRL (GAUZE/BANDAGES/DRESSINGS) ×3 IMPLANT
GAUZE XEROFORM 1X8 LF (GAUZE/BANDAGES/DRESSINGS) ×3 IMPLANT
GLOVE BIO SURGEON STRL SZ7 (GLOVE) ×6 IMPLANT
GLOVE BIOGEL PI IND STRL 7.0 (GLOVE) IMPLANT
GLOVE BIOGEL PI IND STRL 7.5 (GLOVE) IMPLANT
GLOVE BIOGEL PI IND STRL 8.5 (GLOVE) ×1 IMPLANT
GLOVE BIOGEL PI INDICATOR 7.0 (GLOVE) ×4
GLOVE BIOGEL PI INDICATOR 7.5 (GLOVE) ×2
GLOVE BIOGEL PI INDICATOR 8.5 (GLOVE) ×2
GLOVE SURG ORTHO 8.0 STRL STRW (GLOVE) ×3 IMPLANT
GOWN STRL REUS W/ TWL LRG LVL3 (GOWN DISPOSABLE) ×1 IMPLANT
GOWN STRL REUS W/TWL LRG LVL3 (GOWN DISPOSABLE) ×9
GOWN STRL REUS W/TWL XL LVL3 (GOWN DISPOSABLE) ×3 IMPLANT
IV NS IRRIG 3000ML ARTHROMATIC (IV SOLUTION) ×3 IMPLANT
MANIFOLD NEPTUNE II (INSTRUMENTS) IMPLANT
NDL EPIDURAL TUOHY 20GX3.5 (NEEDLE) IMPLANT
NDL SAFETY ECLIPSE 18X1.5 (NEEDLE) ×1 IMPLANT
NDL SPNL 18GX3.5 QUINCKE PK (NEEDLE) IMPLANT
NEEDLE HYPO 18GX1.5 SHARP (NEEDLE) ×3
NEEDLE HYPO 22GX1.5 SAFETY (NEEDLE) ×3 IMPLANT
NEEDLE SPNL 18GX3.5 QUINCKE PK (NEEDLE) IMPLANT
NEEDLE TUOHY 20GX3.5 (NEEDLE) IMPLANT
NS IRRIG 1000ML POUR BTL (IV SOLUTION) IMPLANT
PACK BASIN DAY SURGERY FS (CUSTOM PROCEDURE TRAY) ×3 IMPLANT
PAD CAST 3X4 CTTN HI CHSV (CAST SUPPLIES) ×1 IMPLANT
PADDING CAST ABS 3INX4YD NS (CAST SUPPLIES) ×2
PADDING CAST ABS 4INX4YD NS (CAST SUPPLIES) ×2
PADDING CAST ABS COTTON 3X4 (CAST SUPPLIES) ×1 IMPLANT
PADDING CAST ABS COTTON 4X4 ST (CAST SUPPLIES) ×1 IMPLANT
PADDING CAST COTTON 3X4 STRL (CAST SUPPLIES) ×3
ROUTER HOODED VORTEX 2.9MM (BLADE) ×3 IMPLANT
SET EXT MALE ROTATING LL 32IN (MISCELLANEOUS) ×3 IMPLANT
SET IV EXT TUBING FEMALE 31 (MISCELLANEOUS) ×1 IMPLANT
SET SM JOINT TUBING/CANN (CANNULA) ×3 IMPLANT
SLEEVE SCD COMPRESS KNEE MED (MISCELLANEOUS) ×3 IMPLANT
SLING ARM MED ADULT FOAM STRAP (SOFTGOODS) ×2 IMPLANT
SPLINT PLASTER CAST XFAST 3X15 (CAST SUPPLIES) IMPLANT
SPLINT PLASTER XTRA FASTSET 3X (CAST SUPPLIES) ×40
STOCKINETTE 4X48 STRL (DRAPES) ×3 IMPLANT
SUT PDS AB 2-0 CT2 27 (SUTURE) IMPLANT
SUT STEEL 4 0 (SUTURE) IMPLANT
SUT VIC AB 2-0 PS2 27 (SUTURE) IMPLANT
SUT VICRYL 4-0 PS2 18IN ABS (SUTURE) IMPLANT
SUT VICRYL RAPID 5 0 P 3 (SUTURE) IMPLANT
SUT VICRYL RAPIDE 4/0 PS 2 (SUTURE) ×3 IMPLANT
SYR CONTROL 10ML LL (SYRINGE) ×3 IMPLANT
TRAP DIGIT (INSTRUMENTS) ×2 IMPLANT
TRAP FINGER LRG (INSTRUMENTS) ×2 IMPLANT
TUBE CONNECTING 20'X1/4 (TUBING) ×2
TUBE CONNECTING 20X1/4 (TUBING) ×4 IMPLANT
UNDERPAD 30X30 INCONTINENT (UNDERPADS AND DIAPERS) ×3 IMPLANT
WAND 1.5 MICROBLATOR (SURGICAL WAND) IMPLANT
WATER STERILE IRR 1000ML POUR (IV SOLUTION) ×3 IMPLANT

## 2014-06-20 NOTE — Anesthesia Procedure Notes (Addendum)
Anesthesia Regional Block:  Supraclavicular block  Pre-Anesthetic Checklist: ,, timeout performed, Correct Patient, Correct Site, Correct Laterality, Correct Procedure, Correct Position, site marked, Risks and benefits discussed, pre-op evaluation, post-op pain management  Laterality: Right  Prep: Maximum Sterile Barrier Precautions used and chloraprep       Needles:  Injection technique: Single-shot  Needle Type: Echogenic Stimulator Needle     Needle Length: 5cm 5 cm Needle Gauge: 22 and 22 G    Additional Needles:  Procedures: ultrasound guided (picture in chart) Supraclavicular block Narrative:  Start time: 06/20/2014 1:52 PM End time: 06/20/2014 2:01 PM Injection made incrementally with aspirations every 5 mL. Anesthesiologist: Fitzgerald,MD  Additional Notes: 2% Lidocaine skin wheel.    Procedure Name: LMA Insertion Date/Time: 06/20/2014 2:57 PM Performed by: Baxter Flattery Pre-anesthesia Checklist: Patient identified, Emergency Drugs available, Suction available and Patient being monitored Patient Re-evaluated:Patient Re-evaluated prior to inductionOxygen Delivery Method: Circle System Utilized Preoxygenation: Pre-oxygenation with 100% oxygen Intubation Type: IV induction Ventilation: Mask ventilation without difficulty LMA: LMA inserted LMA Size: 4.0 Number of attempts: 1 Airway Equipment and Method: bite block Placement Confirmation: positive ETCO2 and breath sounds checked- equal and bilateral Tube secured with: Tape Dental Injury: Teeth and Oropharynx as per pre-operative assessment

## 2014-06-20 NOTE — Discharge Instructions (Addendum)

## 2014-06-20 NOTE — Anesthesia Preprocedure Evaluation (Signed)
Anesthesia Evaluation  Patient identified by MRN, date of birth, ID band Patient awake    Reviewed: Allergy & Precautions, H&P , NPO status , Patient's Chart, lab work & pertinent test results, reviewed documented beta blocker date and time   History of Anesthesia Complications (+) history of anesthetic complications  Airway Mallampati: II TM Distance: >3 FB Neck ROM: Full    Dental no notable dental hx. (+) Teeth Intact, Dental Advisory Given   Pulmonary neg pulmonary ROS, sleep apnea and Continuous Positive Airway Pressure Ventilation , former smoker,  breath sounds clear to auscultation  Pulmonary exam normal       Cardiovascular negative cardio ROS  Rhythm:Regular Rate:Normal     Neuro/Psych  Headaches, Anxiety Depression negative psych ROS   GI/Hepatic negative GI ROS, Neg liver ROS,   Endo/Other  Hypothyroidism   Renal/GU negative Renal ROS  negative genitourinary   Musculoskeletal  (+) Fibromyalgia -  Abdominal   Peds  Hematology negative hematology ROS (+)   Anesthesia Other Findings   Reproductive/Obstetrics negative OB ROS                           Anesthesia Physical Anesthesia Plan  ASA: III  Anesthesia Plan: General and Regional   Post-op Pain Management:    Induction: Intravenous  Airway Management Planned: LMA  Additional Equipment:   Intra-op Plan:   Post-operative Plan: Extubation in OR  Informed Consent: I have reviewed the patients History and Physical, chart, labs and discussed the procedure including the risks, benefits and alternatives for the proposed anesthesia with the patient or authorized representative who has indicated his/her understanding and acceptance.   Dental advisory given  Plan Discussed with: CRNA  Anesthesia Plan Comments:         Anesthesia Quick Evaluation

## 2014-06-20 NOTE — Brief Op Note (Signed)
06/20/2014  4:53 PM  PATIENT:  [name]  53 y.o. 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  PROCEDURE:  Procedure(s): ARTHROSCOPY RIGHT THUMB CARPOMETACARPEL GRAFT JACKET INTERPOSITION PARTIAL TRAPEZIECTOMY POSSIBLE OPEN (Right)  SURGEON:  Surgeon(s) and Role:    * Daryll Brod, MD - Primary  PHYSICIAN ASSISTANT:   ASSISTANTS: none   ANESTHESIA:   regional and general  EBL:  Total I/O In: 1750 [I.V.:1750] Out: -   BLOOD ADMINISTERED:none  DRAINS: none   LOCAL MEDICATIONS USED:  NONE  SPECIMEN:  No Specimen  DISPOSITION OF SPECIMEN:  N/A  COUNTS:  YES  TOURNIQUET:    DICTATION: .Other Dictation: Dictation Number [id number]  PLAN OF CARE: Discharge to home after PACU  PATIENT DISPOSITION:  PACU - hemodynamically stable.

## 2014-06-20 NOTE — Progress Notes (Signed)
Assisted Dr. Ola Spurr with right, ultrasound guided, interscalene  block. Side rails up, monitors on throughout procedure. See vital signs in flow sheet. Tolerated Procedure well.

## 2014-06-20 NOTE — Transfer of Care (Signed)
Immediate Anesthesia Transfer of Care Note  Patient: [name]  Procedure(s) Performed: Procedure(s): ARTHROSCOPY RIGHT THUMB CARPOMETACARPEL GRAFT JACKET INTERPOSITION PARTIAL TRAPEZIECTOMY POSSIBLE OPEN (Right)  Patient Location: PACU  Anesthesia Type:GA combined with regional for post-op pain  Level of Consciousness: awake, alert  and oriented  Airway & Oxygen Therapy: Patient Spontanous Breathing and Patient connected to face mask oxygen  Post-op Assessment: Report given to PACU RN, Post -op Vital signs reviewed and stable and Patient moving all extremities  Post vital signs: Reviewed and stable  Complications: No apparent anesthesia complications

## 2014-06-20 NOTE — H&P (Signed)
[name] is 53 years-old.  She is right-hand dominant.  She complains of pain and stiffness in both hands, pain in her wrist areas, catching of the ring fingers bilaterally.  This has been going on for several weeks. She is still seeing Dr. Hardin Negus for chronic pain.  She is status post bilateral carpal tunnel release.  She feels that her wrist pain was aggravated by a fall in the fall of 2014.  She complains of constant, severe, throbbing, aching type pain especially in the fingers, especially in the morning.  Activity, exercise, work makes this worse, rest makes it better.  She is taking Aleve, it is difficult to determine if this is two a day or two twice a day.  She has no history of diabetes or thyroid problems. Her trigger fingers have settled down with the injection. Her major complaint at the present time is the right [facility]. Her right thumb CMC is particularly bothersome for her. The injection gave her only minimal relief.   ALLERGIES:     Sulfa. MEDICATIONS:      Abilify, Neurontin, propranolol, Nucynta, Ritalin, Celexa, Cogentin, Linzess, Vicodin and Percocet. SURGICAL HISTORY:    Wisdom teeth removal, ganglion cyst removed from both wrists, carpal tunnel syndrome                                         bilaterally, hysterectomy, mastectomy with flap reconstruction. FAMILY MEDICAL HISTORY:     Positive for arthritis, otherwise negative. SOCIAL HISTORY:   She smokes half pack a day and is advised to quit with the reasons behind this. She drinks                               socially. She is married.   REVIEW OF SYSTEMS:   Positive for glasses, sleep disorder, depression, otherwise negative 14 points. [name] is an 53 y.o. female.   Chief Complaint: [place] arthritis right thumb HPI: see above  Past Medical History  Diagnosis Date  . Anxiety   . Depression, major     suicidal thoughts -implants, mental health, 05/11  . Fibromyalgia   . Breast reconstruction deformity   .  Hypersomnia     sleep attacks  . Hypothyroidism   . Headache(784.0)   . Migraine headache   . Neoplasm     malignant breast, history of status post bilateral  . Breast cancer     rt breast  and left breast breast removed  . Sleep apnea with use of continuous positive airway pressure (CPAP)     AHI 20 in HST, titration to 6 cm water - residual AHi 6 . remaining hypersomnic.   Marland Kitchen Complication of anesthesia     shaking-after mastectomies-resp and hr dropped    Past Surgical History  Procedure Laterality Date  . Hysterestomy  2005  . Carpal tunnel release  2011    both hands  . Wisdom teeth out    . Btl    . Breast reconstructioin  7/09    flaps-bilat mastectomies  . Colonoscopy w/ polypectomy  4/03    tubulovillous adenoma  . Post- a -cath  2008  . Mastectomy Bilateral 2008  . Ganglion cyst excision      both [phone]    Family History  Problem Relation Age of Onset  . Cancer Mother  breast  . Cancer Sister     breast  . Cancer Maternal Aunt     breast   Social History:  reports that she quit smoking about 2 years ago. She has never used smokeless tobacco. She reports that she drinks alcohol. She reports that she does not use illicit drugs.  Allergies:  Allergies  Allergen Reactions  . Sulfonamide Derivatives Hives    fever    No prescriptions prior to admission    No results found for this or any previous visit (from the past 48 hour(s)).  No results found.   Pertinent items are noted in HPI.  Height 5' 4.5" (1.638 m), weight 78.019 kg (172 lb).  General appearance: alert, cooperative and appears stated age Head: Normocephalic, without obvious abnormality Neck: no JVD Resp: clear to auscultation bilaterally Cardio: regular rate and rhythm, S1, S2 normal, no murmur, click, rub or gallop GI: soft, non-tender; bowel sounds normal; no masses,  no organomegaly Extremities: extremities normal, atraumatic, no cyanosis or edema Pulses: 2+ and  symmetric Skin: Skin color, texture, turgor normal. No rashes or lesions Neurologic: Grossly normal Incision/Wound: na  Assessment/Plan Dx: CMC arthritis right thumb She would like to proceed to have surgical intervention. This is done at her request. We discussed various treatment alternatives including suspensionplasty, LRTI, fusion, interposition possible open. She has selected interposition arthroplasty. This will be done arthroscopically if possible. The pre, peri and post op course are discussed along with risks and complications.  She is aware there is no guarantee with surgery, possibility of infection, recurrence, injury to arteries, nerves and tendons, incomplete relief of symptoms and dystrophy.  Risks and benefits of fusion vs suspensionplasty vs interposition arthroplasty was discussed with the patient at length. She is scheduled for arthroscopic interposition arthroplasty, partial trapezial resection as an outpatient under regional anesthesia to her right wrist  Nichols Corter R 06/20/2014, 11:48 AM

## 2014-06-20 NOTE — Op Note (Signed)
Dictation Number [id number]

## 2014-06-21 ENCOUNTER — Encounter (HOSPITAL_BASED_OUTPATIENT_CLINIC_OR_DEPARTMENT_OTHER): Payer: Self-pay | Admitting: Orthopedic Surgery

## 2014-06-21 NOTE — Anesthesia Postprocedure Evaluation (Signed)
  Anesthesia Post-op Note  Patient: [name]  Procedure(s) Performed: Procedure(s): ARTHROSCOPY RIGHT THUMB CARPOMETACARPEL GRAFT JACKET INTERPOSITION PARTIAL TRAPEZIECTOMY POSSIBLE OPEN (Right)  Patient Location: PACU  Anesthesia Type:General  Level of Consciousness: awake and alert   Airway and Oxygen Therapy: Patient Spontanous Breathing  Post-op Pain: none  Post-op Assessment: Post-op Vital signs reviewed, Patient's Cardiovascular Status Stable, Respiratory Function Stable, Patent Airway, No signs of Nausea or vomiting and Pain level controlled  Post-op Vital Signs: Reviewed and stable  Last Vitals:  Filed Vitals:   06/20/14 1750  BP: 116/69  Pulse: 65  Temp: 36.7 C  Resp: 18    Complications: No apparent anesthesia complications

## 2014-07-07 NOTE — Op Note (Signed)
[name], [name]               ACCOUNT NO.:  [id number]  MEDICAL RECORD NO.:  [id number]  LOCATION:                               FACILITY:  [facility]  PHYSICIAN:  Daryll Brod, M.D.       DATE OF BIRTH:  [birth year]  DATE OF PROCEDURE:  06/20/2014 DATE OF DISCHARGE:  06/20/2014                              OPERATIVE REPORT   PREOPERATIVE DIAGNOSES:  Degenerative arthritis, carpometacarpal joint, right thumb.  POSTOPERATIVE DIAGNOSIS:  Degenerative arthritis, carpometacarpal joint, right thumb.  OPERATION:  Arthroscopic partial trapeziectomy with graft jacket-type collagen spacer, right thumb.  SURGEON:  Daryll Brod, MD  ANESTHESIA:  Supraclavicular block general.  ANESTHESIOLOGIST:  Soledad Gerlach, MD  HISTORY:  The patient is a 53 year old female with a history of degenerative arthritis, carpometacarpal joint of her right thumb.  This has not responded to conservative treatment.  She has elected not to undergo arthroscopic interposition arthroplasty using collagen spacer. At the preoperative area, the patient is seen, the extremity marked by both patient and surgeon.  Antibiotic given.  She is aware of risks and complications including infection; recurrence of injury to arteries, nerves, tendons, incomplete relief of symptoms, and dystrophy, possibility of further intervention being necessary that this procedure does not have a long record of treatment, but is meant to provide relief of symptoms, with the possibility of either suspensionplasty or fusion in the future should this not afford complete relief of symptoms.  PROCEDURE IN DETAIL:  The patient was brought to the operating room after a supraclavicular block was carried out in the preoperative area. A general anesthetic was carried out.  She was prepped using ChloraPrep, supine position with the right arm free.  A 3-minute dry time was allowed.  Time-out taken, confirming the patient and procedure.  The limb  was placed in the concept tower with traction on the thumb.  A 10 pounds was applied.  A 22-gauge needle was used to localize the carpometacarpal joint.  Under image intensification, the joint was inflated with saline.  A longitudinal incision was made just dorsal to the abductor pollicis longus tendon of volar to the extensor pollicis brevis.  A second incision was made to the volar aspect of the abductor pollicis longus insertion on the base of the first metacarpal.  These were carried down through subcutaneous tissue with blunt dissection.  A vein was intercepted on the more dorsal portal.  This was tamponaded.  A 1.9 scope was then introduced, very significant degenerative change was noted within the joint with multiple loose bodies and very significant synovitis.  A 2 mm full radius shaver was then inserted on the opposite portal.  These 2 instruments were then used to debride joint of synovial tissue loose bodies by alternating instruments from 1 portal to the other.  A vortex shaver was then introduced.  This was used to remove the subcortical and subchondral bone of the trapezium distally.  This was done in a manner to allow placement of a Biomet collagen spacer. Image intensification was used to evaluate the amount of resection performed.  This was done in a manner as to stimulate the saddle deformity of the normal architecture of  the distal trapezium, but down into our cancellous bone.  A 4 x 4 cm collagen spacer was then doubled over sutured with 4-0 Vicryl sutures on each of the corners to maintain a pad.  This was then passed through the volar to dorsal portal.  Using a right-angle clamp, this allowed the spacer to be placed between the trapezium distally and the metacarpal base.  This was confirmed in position with the arthroscope introduced both dorsally and palmarly. Moderate amount of bleeding occurred. Felt to be tearing of the dorsal vein.  Pressure was maintained on the  2 portals to control the bleeding. The sutures were then passed closing the 2 portals with 4-0 Vicryl Rapide sutures.  Pressure was maintained to confirm hemostasis.  The tourniquet was not inflated during any portion of the procedure.  On release of pressure, no further bleeding was noted.  A sterile compressive dressing, dorsal palmar thumb spica splint was applied.  X- rays were taken with the traction off confirming that the space was maintained.  The patient tolerated the procedure well and was taken to the recovery room for observation in satisfactory condition.  She will be discharged home to return to the [facility] in 1 week on Percocet.          ______________________________ Daryll Brod, M.D.     GK/MEDQ  D:  06/20/2014  T:  06/21/2014  Job:  [id number]

## 2014-08-16 ENCOUNTER — Other Ambulatory Visit: Payer: Self-pay | Admitting: Orthopedic Surgery

## 2014-08-26 ENCOUNTER — Encounter (HOSPITAL_BASED_OUTPATIENT_CLINIC_OR_DEPARTMENT_OTHER): Payer: Self-pay | Admitting: *Deleted

## 2014-08-29 ENCOUNTER — Ambulatory Visit (HOSPITAL_BASED_OUTPATIENT_CLINIC_OR_DEPARTMENT_OTHER): Payer: BC Managed Care – PPO | Admitting: Anesthesiology

## 2014-08-29 ENCOUNTER — Ambulatory Visit (HOSPITAL_BASED_OUTPATIENT_CLINIC_OR_DEPARTMENT_OTHER)
Admission: RE | Admit: 2014-08-29 | Discharge: 2014-08-29 | Disposition: A | Discharge status: Home / Self Care | Payer: BC Managed Care – PPO | Source: Ambulatory Visit | Attending: Orthopedic Surgery | Admitting: Orthopedic Surgery

## 2014-08-29 ENCOUNTER — Encounter (HOSPITAL_BASED_OUTPATIENT_CLINIC_OR_DEPARTMENT_OTHER)
Admission: RE | Disposition: A | Discharge status: Home / Self Care | Payer: Self-pay | Source: Ambulatory Visit | Attending: Orthopedic Surgery

## 2014-08-29 ENCOUNTER — Encounter (HOSPITAL_BASED_OUTPATIENT_CLINIC_OR_DEPARTMENT_OTHER): Payer: Self-pay | Admitting: Orthopedic Surgery

## 2014-08-29 ENCOUNTER — Encounter (HOSPITAL_BASED_OUTPATIENT_CLINIC_OR_DEPARTMENT_OTHER): Payer: BC Managed Care – PPO | Admitting: Anesthesiology

## 2014-08-29 DIAGNOSIS — G4710 Hypersomnia, unspecified: Secondary | ICD-10-CM | POA: Diagnosis not present

## 2014-08-29 DIAGNOSIS — E039 Hypothyroidism, unspecified: Secondary | ICD-10-CM | POA: Diagnosis not present

## 2014-08-29 DIAGNOSIS — M65841 Other synovitis and tenosynovitis, right hand: Secondary | ICD-10-CM | POA: Insufficient documentation

## 2014-08-29 DIAGNOSIS — F323 Major depressive disorder, single episode, severe with psychotic features: Secondary | ICD-10-CM | POA: Insufficient documentation

## 2014-08-29 DIAGNOSIS — G43909 Migraine, unspecified, not intractable, without status migrainosus: Secondary | ICD-10-CM | POA: Diagnosis not present

## 2014-08-29 DIAGNOSIS — F419 Anxiety disorder, unspecified: Secondary | ICD-10-CM | POA: Diagnosis not present

## 2014-08-29 DIAGNOSIS — Z882 Allergy status to sulfonamides status: Secondary | ICD-10-CM | POA: Insufficient documentation

## 2014-08-29 DIAGNOSIS — G4730 Sleep apnea, unspecified: Secondary | ICD-10-CM | POA: Diagnosis not present

## 2014-08-29 DIAGNOSIS — M797 Fibromyalgia: Secondary | ICD-10-CM | POA: Diagnosis not present

## 2014-08-29 DIAGNOSIS — M65341 Trigger finger, right ring finger: Secondary | ICD-10-CM | POA: Insufficient documentation

## 2014-08-29 DIAGNOSIS — Z853 Personal history of malignant neoplasm of breast: Secondary | ICD-10-CM | POA: Diagnosis not present

## 2014-08-29 HISTORY — PX: CARPOMETACARPEL SUSPENSION PLASTY: SHX5005

## 2014-08-29 HISTORY — PX: TRIGGER FINGER RELEASE: SHX641

## 2014-08-29 LAB — POCT HEMOGLOBIN-HEMACUE: HEMOGLOBIN: 18.9 g/dL — AB (ref 12.0–15.0)

## 2014-08-29 SURGERY — CARPOMETACARPEL (CMC) SUSPENSION PLASTY
Anesthesia: Regional | Site: Finger | Laterality: Right

## 2014-08-29 MED ORDER — BUPIVACAINE-EPINEPHRINE (PF) 0.5% -1:200000 IJ SOLN
INTRAMUSCULAR | Status: DC | PRN
  Administered 2014-08-29: 30 mL via PERINEURAL

## 2014-08-29 MED ORDER — DEXAMETHASONE SODIUM PHOSPHATE 10 MG/ML IJ SOLN
INTRAMUSCULAR | Status: DC | PRN
  Administered 2014-08-29: 10 mg via INTRAVENOUS

## 2014-08-29 MED ORDER — CEFAZOLIN SODIUM-DEXTROSE 2-3 GM-% IV SOLR
INTRAVENOUS | Status: AC
  Filled 2014-08-29: qty 50

## 2014-08-29 MED ORDER — OXYCODONE HCL 5 MG PO TABS: 5.0000 mg | ORAL_TABLET | Freq: Once | ORAL | Status: DC | PRN

## 2014-08-29 MED ORDER — FENTANYL CITRATE 0.05 MG/ML IJ SOLN
INTRAMUSCULAR | Status: AC
  Filled 2014-08-29: qty 2

## 2014-08-29 MED ORDER — CEFAZOLIN SODIUM-DEXTROSE 2-3 GM-% IV SOLR
2.0000 g | INTRAVENOUS | Status: AC
  Administered 2014-08-29: 2 g via INTRAVENOUS

## 2014-08-29 MED ORDER — OXYCODONE HCL 5 MG/5ML PO SOLN: 5.0000 mg | Freq: Once | ORAL | Status: DC | PRN

## 2014-08-29 MED ORDER — ONDANSETRON HCL 4 MG/2ML IJ SOLN
INTRAMUSCULAR | Status: DC | PRN
  Administered 2014-08-29: 4 mg via INTRAVENOUS

## 2014-08-29 MED ORDER — MIDAZOLAM HCL 2 MG/2ML IJ SOLN
1.0000 mg | INTRAMUSCULAR | Status: DC | PRN
  Administered 2014-08-29: 4 mg via INTRAVENOUS

## 2014-08-29 MED ORDER — MIDAZOLAM HCL 2 MG/2ML IJ SOLN
INTRAMUSCULAR | Status: AC
  Filled 2014-08-29: qty 2

## 2014-08-29 MED ORDER — FENTANYL CITRATE 0.05 MG/ML IJ SOLN
50.0000 ug | INTRAMUSCULAR | Status: DC | PRN
  Administered 2014-08-29: 100 ug via INTRAVENOUS

## 2014-08-29 MED ORDER — CHLORHEXIDINE GLUCONATE 4 % EX LIQD: 60.0000 mL | Freq: Once | CUTANEOUS | Status: DC

## 2014-08-29 MED ORDER — CEFAZOLIN SODIUM-DEXTROSE 2-3 GM-% IV SOLR: 2.0000 g | INTRAVENOUS | Status: DC

## 2014-08-29 MED ORDER — PROMETHAZINE HCL 25 MG/ML IJ SOLN: 6.2500 mg | INTRAMUSCULAR | Status: DC | PRN

## 2014-08-29 MED ORDER — EPHEDRINE SULFATE 50 MG/ML IJ SOLN
INTRAMUSCULAR | Status: DC | PRN
  Administered 2014-08-29 (×2): 10 mg via INTRAVENOUS

## 2014-08-29 MED ORDER — HYDROMORPHONE HCL 1 MG/ML IJ SOLN: 0.2500 mg | INTRAMUSCULAR | Status: DC | PRN

## 2014-08-29 MED ORDER — FENTANYL CITRATE 0.05 MG/ML IJ SOLN
INTRAMUSCULAR | Status: AC
  Filled 2014-08-29: qty 4

## 2014-08-29 MED ORDER — LACTATED RINGERS IV SOLN
INTRAVENOUS | Status: DC
  Administered 2014-08-29 (×3): via INTRAVENOUS

## 2014-08-29 MED ORDER — OXYCODONE-ACETAMINOPHEN 10-325 MG PO TABS: 1.0000 | ORAL_TABLET | ORAL | Status: DC | PRN

## 2014-08-29 MED ORDER — PROPOFOL 10 MG/ML IV BOLUS
INTRAVENOUS | Status: DC | PRN
  Administered 2014-08-29: 150 mg via INTRAVENOUS

## 2014-08-29 SURGICAL SUPPLY — 68 items
BIT DRILL JACOB END 9/64INX5IN (BIT) ×2 IMPLANT
BLADE ARTHRO LOK 4 BEAVER (BLADE) IMPLANT
BLADE ARTHRO LOK 4MM BEAVER (BLADE)
BLADE MINI RND TIP GREEN BEAV (BLADE) ×4 IMPLANT
BLADE SURG 15 STRL LF DISP TIS (BLADE) ×2 IMPLANT
BLADE SURG 15 STRL SS (BLADE) ×4
BNDG CMPR 9X4 STRL LF SNTH (GAUZE/BANDAGES/DRESSINGS) ×2
BNDG COHESIVE 3X5 TAN STRL LF (GAUZE/BANDAGES/DRESSINGS) ×4 IMPLANT
BNDG ESMARK 4X9 LF (GAUZE/BANDAGES/DRESSINGS) ×4 IMPLANT
BNDG GAUZE ELAST 4 BULKY (GAUZE/BANDAGES/DRESSINGS) ×4 IMPLANT
BUR EGG 3PK/BX (BURR) IMPLANT
CHLORAPREP W/TINT 26ML (MISCELLANEOUS) ×4 IMPLANT
CLOSURE WOUND 1/4X4 (GAUZE/BANDAGES/DRESSINGS)
CORDS BIPOLAR (ELECTRODE) ×4 IMPLANT
COVER BACK TABLE 60X90IN (DRAPES) ×4 IMPLANT
COVER MAYO STAND STRL (DRAPES) ×4 IMPLANT
CUFF TOURNIQUET SINGLE 18IN (TOURNIQUET CUFF) ×2 IMPLANT
DECANTER SPIKE VIAL GLASS SM (MISCELLANEOUS) IMPLANT
DRAPE EXTREMITY TIBURON (DRAPES) ×4 IMPLANT
DRAPE OEC MINIVIEW 54X84 (DRAPES) ×2 IMPLANT
DRAPE SURG 17X23 STRL (DRAPES) ×4 IMPLANT
GAUZE SPONGE 4X4 12PLY STRL (GAUZE/BANDAGES/DRESSINGS) ×4 IMPLANT
GAUZE SPONGE 4X4 16PLY XRAY LF (GAUZE/BANDAGES/DRESSINGS) IMPLANT
GAUZE XEROFORM 1X8 LF (GAUZE/BANDAGES/DRESSINGS) ×4 IMPLANT
GLOVE BIOGEL M STRL SZ7.5 (GLOVE) ×2 IMPLANT
GLOVE BIOGEL PI IND STRL 7.0 (GLOVE) IMPLANT
GLOVE BIOGEL PI IND STRL 8.5 (GLOVE) ×2 IMPLANT
GLOVE BIOGEL PI INDICATOR 7.0 (GLOVE) ×4
GLOVE BIOGEL PI INDICATOR 8.5 (GLOVE) ×2
GLOVE ECLIPSE 6.5 STRL STRAW (GLOVE) ×4 IMPLANT
GLOVE SURG ORTHO 8.0 STRL STRW (GLOVE) ×4 IMPLANT
GOWN STRL REUS W/ TWL LRG LVL3 (GOWN DISPOSABLE) ×2 IMPLANT
GOWN STRL REUS W/TWL LRG LVL3 (GOWN DISPOSABLE) ×12
GOWN STRL REUS W/TWL XL LVL3 (GOWN DISPOSABLE) ×6 IMPLANT
NEEDLE 27GAX1X1/2 (NEEDLE) ×2 IMPLANT
NS IRRIG 1000ML POUR BTL (IV SOLUTION) ×4 IMPLANT
PACK BASIN DAY SURGERY FS (CUSTOM PROCEDURE TRAY) ×4 IMPLANT
PAD CAST 3X4 CTTN HI CHSV (CAST SUPPLIES) ×2 IMPLANT
PADDING CAST ABS 3INX4YD NS (CAST SUPPLIES)
PADDING CAST ABS 4INX4YD NS (CAST SUPPLIES)
PADDING CAST ABS COTTON 3X4 (CAST SUPPLIES) IMPLANT
PADDING CAST ABS COTTON 4X4 ST (CAST SUPPLIES) ×2 IMPLANT
PADDING CAST COTTON 3X4 STRL (CAST SUPPLIES) ×4
RUBBERBAND STERILE (MISCELLANEOUS) IMPLANT
SHEET MEDIUM DRAPE 40X70 STRL (DRAPES) ×2 IMPLANT
SLEEVE SCD COMPRESS KNEE MED (MISCELLANEOUS) ×4 IMPLANT
SPLINT PLASTER CAST XFAST 3X15 (CAST SUPPLIES) IMPLANT
SPLINT PLASTER XTRA FASTSET 3X (CAST SUPPLIES)
STOCKINETTE 4X48 STRL (DRAPES) ×4 IMPLANT
STRIP CLOSURE SKIN 1/4X4 (GAUZE/BANDAGES/DRESSINGS) IMPLANT
SUT ETHIBOND 2 OS 4 DA (SUTURE) IMPLANT
SUT ETHIBOND 3-0 V-5 (SUTURE) IMPLANT
SUT FIBERWIRE 2-0 18 17.9 3/8 (SUTURE)
SUT FIBERWIRE 4-0 18 DIAM BLUE (SUTURE) ×4
SUT MERSILENE 4 0 P 3 (SUTURE) IMPLANT
SUT STEEL 3 0 (SUTURE) ×2 IMPLANT
SUT STEEL 4 0 (SUTURE) ×4 IMPLANT
SUT VIC AB 4-0 P-3 18XBRD (SUTURE) IMPLANT
SUT VIC AB 4-0 P2 18 (SUTURE) IMPLANT
SUT VIC AB 4-0 P3 18 (SUTURE)
SUT VICRYL RAPID 5 0 P 3 (SUTURE) IMPLANT
SUT VICRYL RAPIDE 4/0 PS 2 (SUTURE) ×4 IMPLANT
SUTURE FIBERWR 2-0 18 17.9 3/8 (SUTURE) IMPLANT
SUTURE FIBERWR 4-0 18 DIA BLUE (SUTURE) ×2 IMPLANT
SYR BULB 3OZ (MISCELLANEOUS) ×4 IMPLANT
SYR CONTROL 10ML LL (SYRINGE) ×2 IMPLANT
TOWEL OR 17X24 6PK STRL BLUE (TOWEL DISPOSABLE) ×8 IMPLANT
UNDERPAD 30X30 INCONTINENT (UNDERPADS AND DIAPERS) ×4 IMPLANT

## 2014-08-29 NOTE — H&P (Signed)
[name] is 53 years-old.  She is right-hand dominant.  She complains of pain and stiffness in both hands, pain in her wrist areas, catching of the ring fingers bilaterally.  This has been going on for several weeks. She is still seeing Dr. Hardin Negus for chronic pain.  She is status post bilateral carpal tunnel release.  She feels that her wrist pain was aggravated by a fall in the fall of 2014.  She complains of constant, severe, throbbing, aching type pain especially in the fingers, especially in the morning.  Activity, exercise, work makes this worse, rest makes it better.  She is taking Aleve, it is difficult to determine if this is two a day or two twice a day.  She has no history of diabetes or thyroid problems. She is post  arthroscopic interposition arthroplasty.She continues to complain bitterly of pain in her right thumb CMC arthroplasty. She desires proceeding to have this revised.   ALLERGIES:     Sulfa. MEDICATIONS:      Abilify, Neurontin, propranolol, Nucynta, Ritalin, Celexa, Cogentin, Linzess, Vicodin and Percocet. SURGICAL HISTORY:    Wisdom teeth removal, ganglion cyst removed from both wrists, carpal tunnel syndrome                                         bilaterally, hysterectomy, mastectomy with flap reconstruction. FAMILY MEDICAL HISTORY:     Positive for arthritis, otherwise negative. SOCIAL HISTORY:   She smokes half pack a day and is advised to quit with the reasons behind this. She drinks                               socially. She is married.   REVIEW OF SYSTEMS:   Positive for glasses, sleep disorder, depression, otherwise negative 14 points. [name] is an 53 y.o. female.   Chief Complaint: Pain post interposition arthroplasty right thumb and trigger right ring finger HPI: see above  Past Medical History  Diagnosis Date  . Anxiety   . Depression, major     suicidal thoughts -implants, mental health, 05/11  . Fibromyalgia   . Breast reconstruction  deformity   . Hypersomnia     sleep attacks  . Hypothyroidism   . Headache(784.0)   . Migraine headache   . Neoplasm     malignant breast, history of status post bilateral  . Breast cancer     rt breast  and left breast breast removed  . Sleep apnea with use of continuous positive airway pressure (CPAP)     AHI 20 in HST, titration to 6 cm water - residual AHi 6 . remaining hypersomnic.   Marland Kitchen Complication of anesthesia     shaking-after mastectomies-resp and hr dropped    Past Surgical History  Procedure Laterality Date  . Hysterestomy  2005  . Carpal tunnel release  2011    both hands  . Wisdom teeth out    . Btl    . Breast reconstructioin  7/09    flaps-bilat mastectomies  . Colonoscopy w/ polypectomy  4/03    tubulovillous adenoma  . Post- a -cath  2008  . Mastectomy Bilateral 2008  . Ganglion cyst excision      both [phone]  . Finger arthroscopy with carpometacarpel (cmc) arthroplasty Right 06/20/2014    Procedure: ARTHROSCOPY RIGHT THUMB CARPOMETACARPEL  GRAFT JACKET INTERPOSITION PARTIAL TRAPEZIECTOMY POSSIBLE OPEN;  Surgeon: Daryll Brod, MD;  Location: [facility];  Service: Orthopedics;  Laterality: Right;    Family History  Problem Relation Age of Onset  . Cancer Mother     breast  . Cancer Sister     breast  . Cancer Maternal Aunt     breast   Social History:  reports that she quit smoking about 2 years ago. She has never used smokeless tobacco. She reports that she drinks alcohol. She reports that she does not use illicit drugs.  Allergies:  Allergies  Allergen Reactions  . Sulfonamide Derivatives Hives    fever    No prescriptions prior to admission    No results found for this or any previous visit (from the past 48 hour(s)).  No results found.   Pertinent items are noted in HPI.  Height 5' 4.5" (1.638 m), weight 74.844 kg (165 lb).  General appearance: alert, cooperative and appears stated age Head: Normocephalic, without  obvious abnormality Neck: no JVD Resp: clear to auscultation bilaterally Cardio: regular rate and rhythm, S1, S2 normal, no murmur, click, rub or gallop GI: negative Extremities: trigger right ring and painful arthroplasty right thumb Pulses: 2+ and symmetric Skin: Skin color, texture, turgor normal. No rashes or lesions Neurologic: Grossly normal Incision/Wound: Healed   Assessment/Plan We will plan on removal of the implant, removal of the remainder to the trapezium, suspensionplasty with APL tendon transfer. Pre, peri and post op care are discussed along with risks and complications. Patient is aware there is no guarantee with surgery, possibility of infection, injury to arteries, nerves, and tendons, incomplete relief and dystrophy. This will be scheduled as an outpatient under general regional right thumb.  [name] R 08/29/2014, 9:44 AM

## 2014-08-29 NOTE — Discharge Instructions (Addendum)
[facility] Instructions °Hand Surgery ° °Wound Care: °Keep your hand elevated above the level of your heart.  Do not allow it to dangle by your side.  Keep the dressing dry and do not remove it unless your doctor advises you to do so.  He will usually change it at the time of your post-op visit.  Moving your fingers is advised to stimulate circulation but will depend on the site of your surgery.  If you have a splint applied, your doctor will advise you regarding movement. ° °Activity: °Do not drive or operate machinery today.  Rest today and then you may return to your normal activity and work as indicated by your physician. ° °Diet:  °Drink liquids today or eat a light diet.  You may resume a regular diet tomorrow.   ° °General expectations: °Pain for two to three days. °Fingers may become slightly swollen. ° °Call your doctor if any of the following occur: °Severe pain not relieved by pain medication. °Elevated temperature. °Dressing soaked with blood. °Inability to move fingers. °White or bluish color to fingers. ° ° °Regional Anesthesia Blocks ° °1. Numbness or the inability to move the "blocked" extremity may last from 3-48 hours after placement. The length of time depends on the medication injected and your individual response to the medication. If the numbness is not going away after 48 hours, call your surgeon. ° °2. The extremity that is blocked will need to be protected until the numbness is gone and the  Strength has returned. Because you cannot feel it, you will need to take extra care to avoid injury. Because it may be weak, you may have difficulty moving it or using it. You may not know what position it is in without looking at it while the block is in effect. ° °3. For blocks in the legs and feet, returning to weight bearing and walking needs to be done carefully. You will need to wait until the numbness is entirely gone and the strength has returned. You should be able to move your leg and foot  normally before you try and bear weight or walk. You will need someone to be with you when you first try to ensure you do not fall and possibly risk injury. ° °4. Bruising and tenderness at the needle site are common side effects and will resolve in a few days. ° °5. Persistent numbness or new problems with movement should be communicated to the surgeon or the [facility] ([phone])/ [facility] ([phone]). ° ° °Post Anesthesia Home Care Instructions ° °Activity: °Get plenty of rest for the remainder of the day. A responsible adult should stay with you for 24 hours following the procedure.  °For the next 24 hours, DO NOT: °-Drive a car °-Operate machinery °-Drink alcoholic beverages °-Take any medication unless instructed by your physician °-Make any legal decisions or sign important papers. ° °Meals: °Start with liquid foods such as gelatin or soup. Progress to regular foods as tolerated. Avoid greasy, spicy, heavy foods. If nausea and/or vomiting occur, drink only clear liquids until the nausea and/or vomiting subsides. Call your physician if vomiting continues. ° °Special Instructions/Symptoms: °Your throat may feel dry or sore from the anesthesia or the breathing tube placed in your throat during surgery. If this causes discomfort, gargle with warm salt water. The discomfort should disappear within 24 hours. ° °

## 2014-08-29 NOTE — Op Note (Signed)
Dictation Number [id number]

## 2014-08-29 NOTE — Anesthesia Procedure Notes (Addendum)
Anesthesia Regional Block:  Supraclavicular block  Pre-Anesthetic Checklist: ,, timeout performed, Correct Patient, Correct Site, Correct Laterality, Correct Procedure,, site marked, risks and benefits discussed, Surgical consent,  Pre-op evaluation,  At surgeon's request and post-op pain management  Laterality: Right  Prep: chloraprep       Needles:   Needle Type: Echogenic Stimulator Needle          Additional Needles:  Procedures: nerve stimulator Supraclavicular block  Nerve Stimulator or Paresthesia:  Response: Bicep contraction, 0.45 mA,  Response: wrist extension, 0.45 mA,   Additional Responses:   Narrative:  Start time: 08/29/2014 11:19 AM End time: 08/29/2014 11:29 AM Injection made incrementally with aspirations every 5 mL.  Performed by: Personally   Additional Notes: Functioning IV was confirmed and monitors were applied. A 31mm 21ga Arrow stimulator needle was used.  Sterile prep and drape,hand hygiene and sterile gloves were used.  Negative aspiration of air or blood and negative test dose prior to incremental administration of local anesthetic. The patient tolerated the procedure well.     Procedure Name: LMA Insertion Date/Time: 08/29/2014 11:39 AM Performed by: Maryella Shivers Pre-anesthesia Checklist: Patient identified, Emergency Drugs available, Suction available and Patient being monitored Patient Re-evaluated:Patient Re-evaluated prior to inductionOxygen Delivery Method: Circle System Utilized Preoxygenation: Pre-oxygenation with 100% oxygen Intubation Type: IV induction Ventilation: Mask ventilation without difficulty LMA: LMA inserted LMA Size: 4.0 Number of attempts: 1 Airway Equipment and Method: bite block Placement Confirmation: positive ETCO2 Tube secured with: Tape Dental Injury: Teeth and Oropharynx as per pre-operative assessment

## 2014-08-29 NOTE — Brief Op Note (Signed)
08/29/2014  1:09 PM  PATIENT:  [name]  53 y.o. female  PRE-OPERATIVE DIAGNOSIS:  ARTHROPLASTY RIGHT THUMB  POST-OPERATIVE DIAGNOSIS:  Status Post Arthroplasty Right Thumb, Trigger Finger Right Ring  PROCEDURE:  Procedure(s): REMOVAL DERMASPAN SUSPENSIONPLASTY RIGHT THUMB ABDUCTOR POLLIS TRANSFER (Right) RELEASE TRIGGER FINGER/A-1 PULLEY (Right)  SURGEON:  Surgeon(s) and Role:    * Daryll Brod, MD - Primary  PHYSICIAN ASSISTANT:   ASSISTANTS:  R Dasnoit, PAC    ANESTHESIA:   regional and general  EBL:  Total I/O In: 1800 [I.V.:1800] Out: -   BLOOD ADMINISTERED:none  DRAINS: none   LOCAL MEDICATIONS USED:  NONE  SPECIMEN:  No Specimen  DISPOSITION OF SPECIMEN:  N/A  COUNTS:  YES  TOURNIQUET:   Total Tourniquet Time Documented: Upper Arm (Right) - 72 minutes Total: Upper Arm (Right) - 72 minutes   DICTATION: .Other Dictation: Dictation Number [id number]  PLAN OF CARE: Discharge to home after PACU  PATIENT DISPOSITION:  PACU - hemodynamically stable.

## 2014-08-29 NOTE — Op Note (Signed)
NAMESHAYLON, GILLEAN               ACCOUNT NO.:  [id number]  MEDICAL RECORD NO.:  [id number]  LOCATION:                                 FACILITY:  PHYSICIAN:  Daryll Brod, M.D.            DATE OF BIRTH:  DATE OF PROCEDURE:  08/29/2014 DATE OF DISCHARGE:                              OPERATIVE REPORT   PREOPERATIVE DIAGNOSES:  Status post arthroscopic interposition CMC right thumb with pain, stenosing tenosynovitis right ring finger.  POSTOPERATIVE DIAGNOSES:  Status post arthroscopic interposition CMC right thumb with pain, stenosing tenosynovitis right ring finger.  OPERATION:  Excision of remainder of the trapezium, DermaSpan and suspensionplasty of right thumb with release of A1 pulley, right ring finger.  SURGEON:  Daryll Brod, M.D.  ASSISTANT:  Julian Reil, P.A.  ANESTHESIA:  Supraclavicular block, general.  ANESTHESIOLOGIST:  Nelda Severe. Tobias Alexander, M.D.  HISTORY:  The patient is a 53 year old female with a history of CMC arthritis, underwent interposition arthroplasty arthroscopically, has had continued pain following this, has requested revision to suspensionplasty ORTI.  Pre, peri, and postoperative course have been discussed along with risks and complications.  She is aware that there is no guarantee with the surgery, possibility of infection, recurrence of injury to arteries, nerves, tendons, incomplete relief of symptoms, dystrophy.  She also has developed triggering of her right ring finger, is desirous having that surgically released.  She is aware of risks with recurrence, similar risks to the suspensionplasty.  On the preoperative area, the patient was seen, the extremity marked by both patient and surgeon.  Antibiotic given.  DESCRIPTION OF PROCEDURE:  The patient was brought to the operating room, where a supraclavicular block general anesthetic were carried out without difficulty.  She was prepped using ChloraPrep, supine position, right arm free.  A  3-minute dry time was allowed.  Time-out taken, confirming the patient and procedure.  The limb was exsanguinated with an Esmarch bandage.  Tourniquet was placed high and the arm was inflated to 250 mmHg.  The trigger finger was attended to first.  An oblique incision made over the A1 pulley of the right ring finger, carried down through subcutaneous tissue.  Bleeders were electrocauterized with bipolar.  Thickening of the A1 pulley was noted with a fairly significant tenosynovitis proximally.  The tenosynovitis was released. The A1 pulley released on its radial aspect.  The finger placed through a full range motion, no further triggering was noted.  Two tendons were separated, to be certain there was no adherence between tenosynovial tissue of the two tendons.  The wound was irrigated and closed with interrupted 4-0 Vicryl Rapide sutures.  A separate incision was then made of the right wrist L shape in nature carried down through subcutaneous tissue.  The space over the [facility] area of her right thumb and carried along the abductor pollicis longus tendon.  Bleeders again were electrocauterized with blunt dissection.  Radial sensory nerves were identified and protected.  The dissection carried down to the carpometacarpal joint.  The radial artery was identified proximally. The DermaSpan was partially extruded, the remainder was within the joint primarily on the radial aspect.  This was  removed without difficulty. The joint was opened.  The remainder of the trapezium was then excised with blunt and sharp dissection, rongeured, this was morselized and removed in toto.  The area of the synovitis was debrided.  A drill hole was then placed in the base of the thumb metacarpal obliquely to exit from the dorsal to the radial, to the ulnar volar cortex for acceptance of a graft.  A separate drill hole was then made in the base of the second metacarpal obliquely from volar to dorsal for acceptance of  the tendon transfer at the most dorsal half of the abductor pollicis longus tendon, was then isolated.  A separate incision was then made proximally over the musculotendinous junction.  This was carried down through subcutaneous tissue.  Again radial sensory nerves were protected as much as possible.  The sheath fascia opened over the abduct pollicis muscle. A Bunnell tendon retriever was then placed through the first extensor dorsal compartment and a 30-gauge monofilament wire was then passed around the dorsal portion of the abductor pollicis longus.  This was used as a cheese cutter to develop the tendon graft which was transected proximally at musculotendinous junction.  The tendon graft was then weaved from dorsal to pull palmar through the base of the first metacarpal, then palmar to dorsal on the second metacarpal.  This was then brought around the metacarpal base, brought back into the area of the trapezium.  Excision cavity and sutured to the abductor pollicis tendon at the base of the thumb metacarpal.  This was done with multiple figure-of-eight 4-0 FiberWires.  This firmly stabilized the first metacarpal from the second metacarpal with no proximal translation.  The wounds were copiously irrigated with saline.  The capsule of the [facility] joint was then closed with figure-of-eight 4-0 Vicryl sutures.  The subcutaneous tissue with interrupted 4-0 Vicryl and skin with interrupted 4-0 Vicryl Rapide sutures to each of the wounds.  This was done after copiously irrigating the area of excision of the trapezium. A sterile compressive dressing, dorsal palmar splint with plate, thumb spica in nature.  On deflation of the tourniquet, all fingers immediately pinked.  She was taken to the recovery room for observation in satisfactory condition.  She will be discharged home to return to the [place] in 1 week on Percocet.          ______________________________ Daryll Brod,  M.D.     GK/MEDQ  D:  08/29/2014  T:  08/29/2014  Job:  [id number]

## 2014-08-29 NOTE — Progress Notes (Signed)
Assisted Dr. Singer with right, ultrasound guided, supraclavicular block. Side rails up, monitors on throughout procedure. See vital signs in flow sheet. Tolerated Procedure well. 

## 2014-08-29 NOTE — Anesthesia Postprocedure Evaluation (Signed)
Anesthesia Post Note  Patient: [name]  Procedure(s) Performed: Procedure(s) (LRB): REMOVAL DERMASPAN SUSPENSIONPLASTY RIGHT THUMB ABDUCTOR POLLIS TRANSFER (Right) RELEASE TRIGGER FINGER/A-1 PULLEY (Right)  Anesthesia type: general  Patient location: PACU  Post pain: Pain level controlled  Post assessment: Patient's Cardiovascular Status Stable  Last Vitals:  Filed Vitals:   08/29/14 1436  BP: 131/79  Pulse: 74  Temp: 36.4 C  Resp: 18    Post vital signs: Reviewed and stable  Level of consciousness: sedated  Complications: No apparent anesthesia complications

## 2014-08-29 NOTE — Anesthesia Preprocedure Evaluation (Addendum)
Anesthesia Evaluation  Patient identified by MRN, date of birth, ID band  Airway Mallampati: II TM Distance: >3 FB Neck ROM: Full    Dental  (+) Teeth Intact, Dental Advisory Given   Pulmonary sleep apnea , former smoker,    Pulmonary exam normal       Cardiovascular negative cardio ROS      Neuro/Psych  Headaches, PSYCHIATRIC DISORDERS Anxiety Depression    GI/Hepatic negative GI ROS, Neg liver ROS,   Endo/Other  Hypothyroidism   Renal/GU negative Renal ROS     Musculoskeletal  (+) Fibromyalgia -  Abdominal   Peds  Hematology   Anesthesia Other Findings   Reproductive/Obstetrics                          Anesthesia Physical Anesthesia Plan  ASA: II  Anesthesia Plan: General   Post-op Pain Management:    Induction: Intravenous  Airway Management Planned: LMA  Additional Equipment:   Intra-op Plan:   Post-operative Plan: Extubation in OR  Informed Consent: I have reviewed the patients History and Physical, chart, labs and discussed the procedure including the risks, benefits and alternatives for the proposed anesthesia with the patient or authorized representative who has indicated his/her understanding and acceptance.   Dental advisory given  Plan Discussed with: CRNA, Anesthesiologist and Surgeon  Anesthesia Plan Comments:        Anesthesia Quick Evaluation                                  Anesthesia Evaluation  Patient identified by MRN, date of birth, ID band Patient awake    Reviewed: Allergy & Precautions, H&P , NPO status , Patient's Chart, lab work & pertinent test results, reviewed documented beta blocker date and time   History of Anesthesia Complications (+) history of anesthetic complications  Airway Mallampati: II TM Distance: >3 FB Neck ROM: Full    Dental no notable dental hx. (+) Teeth Intact, Dental Advisory Given   Pulmonary neg pulmonary ROS,  sleep apnea and Continuous Positive Airway Pressure Ventilation , former smoker,  breath sounds clear to auscultation  Pulmonary exam normal       Cardiovascular negative cardio ROS  Rhythm:Regular Rate:Normal     Neuro/Psych  Headaches, Anxiety Depression negative psych ROS   GI/Hepatic negative GI ROS, Neg liver ROS,   Endo/Other  Hypothyroidism   Renal/GU negative Renal ROS  negative genitourinary   Musculoskeletal  (+) Fibromyalgia -  Abdominal   Peds  Hematology negative hematology ROS (+)   Anesthesia Other Findings   Reproductive/Obstetrics negative OB ROS                           Anesthesia Physical Anesthesia Plan  ASA: III  Anesthesia Plan: General and Regional   Post-op Pain Management:    Induction: Intravenous  Airway Management Planned: LMA  Additional Equipment:   Intra-op Plan:   Post-operative Plan: Extubation in OR  Informed Consent: I have reviewed the patients History and Physical, chart, labs and discussed the procedure including the risks, benefits and alternatives for the proposed anesthesia with the patient or authorized representative who has indicated his/her understanding and acceptance.   Dental advisory given  Plan Discussed with: CRNA  Anesthesia Plan Comments:         Anesthesia Quick Evaluation

## 2014-08-29 NOTE — Transfer of Care (Signed)
Immediate Anesthesia Transfer of Care Note  Patient: [name]  Procedure(s) Performed: Procedure(s): REMOVAL DERMASPAN SUSPENSIONPLASTY RIGHT THUMB ABDUCTOR POLLIS TRANSFER (Right) RELEASE TRIGGER FINGER/A-1 PULLEY (Right)  Patient Location: PACU  Anesthesia Type:GA combined with regional for post-op pain  Level of Consciousness: sedated  Airway & Oxygen Therapy: Patient Spontanous Breathing and Patient connected to face mask oxygen  Post-op Assessment: Report given to PACU RN and Post -op Vital signs reviewed and stable  Post vital signs: Reviewed and stable  Complications: No apparent anesthesia complications

## 2014-08-30 ENCOUNTER — Encounter (HOSPITAL_BASED_OUTPATIENT_CLINIC_OR_DEPARTMENT_OTHER): Payer: Self-pay | Admitting: Orthopedic Surgery

## 2014-10-21 ENCOUNTER — Encounter: Payer: Self-pay | Admitting: Nurse Practitioner

## 2014-10-21 ENCOUNTER — Ambulatory Visit (INDEPENDENT_AMBULATORY_CARE_PROVIDER_SITE_OTHER): Payer: BC Managed Care – PPO | Admitting: Nurse Practitioner

## 2014-10-21 VITALS — BP 118/70 | HR 62 | Ht 64.0 in | Wt 177.0 lb

## 2014-10-21 DIAGNOSIS — G4710 Hypersomnia, unspecified: Secondary | ICD-10-CM

## 2014-10-21 DIAGNOSIS — G4730 Sleep apnea, unspecified: Secondary | ICD-10-CM

## 2014-10-21 DIAGNOSIS — G4733 Obstructive sleep apnea (adult) (pediatric): Secondary | ICD-10-CM

## 2014-10-21 MED ORDER — ARMODAFINIL 150 MG PO TABS: 150.0000 mg | ORAL_TABLET | Freq: Every day | ORAL | Status: DC

## 2014-10-21 NOTE — Progress Notes (Signed)
[facility]  PATIENT: [name] DOB: 1961-02-06   REASON FOR VISIT: Follow-up for hypersomnia which is persistent  HISTORY OF PRESENT ILLNESS:Ms Alvi, 53 year old returns for followup. She has a history of excessive daytime sleepiness. She is currently on methylphenidate patch by her psychiatrist. She has tried Provigil in the past but still had problems with sleepiness. She is using CPAP and was 100% compliant at last check with Dr. Brett Fairy 03/05/2014. Her Epworth remains high at 23 today. Her fatigue severity scale is 59 .Her depression worsened on Adderall and her psychiatrist switched her to Ritalin which she has been taking about 6 months . She was  on Nuvigil 250 at one time but has been off of that medication for 6 months.  She reports that she has had one episode per week since being on the Ritalin when she fell asleep standing up and caught herself as she was falling. She also feels she has had more confusion (Cognitive impairment)over the last 6 months .she recently had hand surgery on the right and is currently getting some physical therapy. She is on narcotic medications. She claims she is sleeping 7-8 hours at night but takes a nap 2-3 hours at least once or twice a week. She is still having vivid dreams. Dr. Brett Fairy had wanted to put her on Xyrem but her insurance would not cover the medication. She returns for reevaluation    HISTORY: excessive daytime sleepiness this review was asleep consultation7-25-2014. The patient has had some benefit (temporarly) from Provigil but needs still out of well in daytime to help her operate especially a car safely. She has tested negative for the HLA related genetic narcolepsy, but she remains excessively daytime sleepy after 100 days of CPAP he was as high compliance and reduction of the AHI to single digits 6.2 on average. Her Epworth today was 19 points last visit 21 points of fatigue severity scale is endorsed 57  point which is only marginal reduced last visit. The patient hd endorsed the Olevia Bowens s inventory at 48 points!  I am tempted to treat the patient with XYREM in light of persitent hypersomnia in a situation with CPAP controlled obstructive sleep apnea.  But XYREM can worsen depression and isolated cases, and it should not be used with narcotic pain medications. She relates to me today that she has not used hydrocodone acetaminophen that was prescribed for when necessary use and therefore considers his health not needing the medication. I think it would be worthwhile to give Xyrem a trial in this patient. Given that she remains on antidepressant medications and more lactic atypical medications are a feel that a MSLT would still be invalid.  HLA testing was performed, and negative.  Mrs. Nehring brought her machine today to our office visit; she had used it 100% of the 30 day period ending today 03-05-14. Duration of CPAP therapy at 6 hours 2 minutes, residual AHI is 5.9, CPAP pressure set at 7 cm water. Baseline study AHI was 11.7 and RDI was 12.1 in supine sleep 27.6. Because of the morning pain, but I attributed to plantar fasciitis, she seems to have more problems in the morning to start her day.  Again today's Epworth sleepiness score was 19 points and her fatigue severity score was still very high endorsed at 61 point. She feels no cognitive improvement.  Her sleepiness reportedly is due " to fits at night". She doesn't know when she will fall asleep, has sleep attacks, is  difficult to arouse. Nocturnal sleep time is still 8 hours, less vivid dreams since last visist.    REVIEW OF SYSTEMS: Full 14 system review of systems performed and notable only for those listed, all others are neg:  Constitutional: N/A  Cardiovascular: N/A  Ear/Nose/Throat: N/A  Skin: N/A  Eyes: N/A  Respiratory: Cough shortness of breath  Gastroitestinal: N/A  Hematology/Lymphatic: N/A  Endocrine:  N/A Musculoskeletal: Back pain, aching muscles, muscle cramps, neck stiffness Allergy/Immunology: N/A  Neurological: Memory loss Psychiatric: Decreased concentration  Sleep : Acting out her dreams, daytime sleepiness ALLERGIES: Allergies  Allergen Reactions  . Sulfonamide Derivatives Hives    fever    HOME MEDICATIONS: Outpatient Prescriptions Prior to Visit  Medication Sig Dispense Refill  . Alpha-Lipoic Acid (LIPOIC ACID PO) Take 1 tablet by mouth 2 (two) times daily.    . ARIPiprazole (ABILIFY) 2 MG tablet Take 2 mg by mouth daily.      . benztropine (COGENTIN) 0.5 MG tablet Take 0.5 mg by mouth at bedtime as needed and may repeat dose one time if needed.      . citalopram (CELEXA) 40 MG tablet Take 40 mg by mouth daily.      Marland Kitchen gabapentin (NEURONTIN) 300 MG capsule Take 600 mg by mouth 2 (two) times daily.     Marland Kitchen glucosamine-chondroitin 500-400 MG tablet Take 1 tablet by mouth 3 (three) times daily.    . methylphenidate (DAYTRANA) 20 MG/9HR Place 1 patch onto the skin daily. wear patch for 9 hours only each day    . Multiple Vitamins-Minerals (MULTIVITAMIN WITH MINERALS) tablet Take 1 tablet by mouth daily.    . naproxen (NAPROSYN) 500 MG tablet as needed.    . Nutritional Supplements (MELATONIN PO) Take by mouth Nightly.      Marland Kitchen oxyCODONE (OXY IR/ROXICODONE) 5 MG immediate release tablet     . propranolol ER (INDERAL LA) 120 MG 24 hr capsule Take 120 mg by mouth 2 (two) times daily. Takes for anxiety    . Tapentadol HCl (NUCYNTA PO) Take 200 mg by mouth 2 (two) times daily. For pain    . clobetasol cream (TEMOVATE) 0.05 %     . LINZESS 145 MCG CAPS capsule 1 capsule daily.    Marland Kitchen oxyCODONE-acetaminophen (PERCOCET) 10-325 MG per tablet Take 1 tablet by mouth every 4 (four) hours as needed for pain. (Patient not taking: Reported on 10/21/2014) 30 tablet 0  . oxyCODONE-acetaminophen (PERCOCET) 10-325 MG per tablet Take 1 tablet by mouth every 4 (four) hours as needed for pain. (Patient  not taking: Reported on 10/21/2014) 30 tablet 0   No facility-administered medications prior to visit.    PAST MEDICAL HISTORY: Past Medical History  Diagnosis Date  . Anxiety   . Depression, major     suicidal thoughts -implants, mental health, 05/11  . Fibromyalgia   . Breast reconstruction deformity   . Hypersomnia     sleep attacks  . Hypothyroidism   . Headache(784.0)   . Migraine headache   . Neoplasm     malignant breast, history of status post bilateral  . Breast cancer     rt breast  and left breast breast removed  . Sleep apnea with use of continuous positive airway pressure (CPAP)     AHI 20 in HST, titration to 6 cm water - residual AHi 6 . remaining hypersomnic.   Marland Kitchen Complication of anesthesia     shaking-after mastectomies-resp and hr dropped    PAST SURGICAL HISTORY: Past  Surgical History  Procedure Laterality Date  . Hysterestomy  2005  . Carpal tunnel release  2011    both hands  . Wisdom teeth out    . Btl    . Breast reconstructioin  7/09    flaps-bilat mastectomies  . Colonoscopy w/ polypectomy  4/03    tubulovillous adenoma  . Post- a -cath  2008  . Mastectomy Bilateral 2008  . Ganglion cyst excision      both [phone]  . Finger arthroscopy with carpometacarpel (cmc) arthroplasty Right 06/20/2014    Procedure: ARTHROSCOPY RIGHT THUMB CARPOMETACARPEL GRAFT JACKET INTERPOSITION PARTIAL TRAPEZIECTOMY POSSIBLE OPEN;  Surgeon: Daryll Brod, MD;  Location: [place];  Service: Orthopedics;  Laterality: Right;  . Carpometacarpel suspension plasty Right 08/29/2014    Procedure: REMOVAL DERMASPAN SUSPENSIONPLASTY RIGHT THUMB ABDUCTOR POLLIS TRANSFER;  Surgeon: Daryll Brod, MD;  Location: [place];  Service: Orthopedics;  Laterality: Right;  . Trigger finger release Right 08/29/2014    Procedure: RELEASE TRIGGER FINGER/A-1 PULLEY;  Surgeon: Daryll Brod, MD;  Location: [place];  Service: Orthopedics;  Laterality:  Right;    FAMILY HISTORY: Family History  Problem Relation Age of Onset  . Cancer Mother     breast  . Cancer Sister     breast  . Cancer Maternal Aunt     breast    SOCIAL HISTORY: History   Social History  . Marital Status: Married    Spouse Name: Leory Plowman    Number of Children: 3  . Years of Education: college   Occupational History  . disabled     Currently in [place] at  [place] Topics  . Smoking status: Former Smoker    Quit date: 06/14/2012  . Smokeless tobacco: Never Used     Comment: quit smoking in 1999  . Alcohol Use: Yes     Comment: one  glass of wine every evening  . Drug Use: No  . Sexual Activity: Not on file   Other Topics Concern  . Not on file   Social History Narrative   Patient  Is married Leory Plowman) and lives at home with her husband.   Patient has 3 children.    Patient is disabled.    Patient has a college education.    Patient is right-handed.   Patient drinks one cup of coffee daily and one coke per day.     PHYSICAL EXAM  Filed Vitals:   10/21/14 1332  BP: 118/70  Pulse: 62  Height: $Remove'5\' 4"'gNPlSGL$  (1.626 m)  Weight: 177 lb (80.287 kg)   Body mass index is 30.37 kg/(m^2). Generalized: Well developed, in no acute distress  Head: normocephalic and atraumatic,. Oropharynx benign .   Neck: Supple, no carotid bruits  Cardiac: Regular rate rhythm, no murmur.  Musculoskeletal: No deformity   Neurological examination   Mentation: Alert oriented to time, place, history taking. Follows all commands speech and language fluent. ESS 23. FSS 59.   Cranial nerve II-XII: Pupils were equal round reactive to light extraocular movements were full, visual field were full on confrontational test. Facial sensation and strength were normal. hearing was intact to finger rubbing bilaterally. Uvula tongue midline. head turning and shoulder shrug were normal and symmetric.Tongue protrusion into cheek strength was normal. Motor:  normal bulk and tone, full strength in the BUE, BLE, fine finger movements normal, no pronator drift. No focal weakness, mild decreased grip right hand. Sensory: normal and symmetric to  light touch, pinprick, and vibration  Coordination: finger-nose-finger, heel-to-shin bilaterally, no dysmetria Reflexes: Brachioradialis 2/2, biceps 2/2, triceps 2/2, patellar 2/2, Achilles 2/2, plantar responses were flexor bilaterally. Gait and Station: Rising up from seated position without assistance, normal stance, moderate stride, good arm swing, smooth turning, able to perform tiptoe, and heel walking without difficulty. Tandem gait is unsteady   DIAGNOSTIC DATA (LABS, IMAGING, TESTING) - I reviewed patient records, labs, notes, testing and imaging myself where available.  Lab Results  Component Value Date   WBC 5.7 04/28/2011   HGB 18.9* 08/29/2014   HCT 38.0 04/28/2011   MCV 86.1 04/28/2011   PLT 192 04/28/2011      Component Value Date/Time   NA 131* 07/18/2013 1112   NA 138 10/11/2011 1339   K 4.6 07/18/2013 1112   CL 91* 07/18/2013 1112   CO2 30* 07/18/2013 1112   GLUCOSE 107* 07/18/2013 1112   GLUCOSE 111* 10/11/2011 1339   BUN 9 07/18/2013 1112   BUN 14 10/11/2011 1339   CREATININE 0.63 07/18/2013 1112   CALCIUM 10.0 07/18/2013 1112   PROT 6.6 07/18/2013 1112   PROT 6.6 10/11/2011 1339   ALBUMIN 3.8 10/11/2011 1339   AST 23 07/18/2013 1112   ALT 22 07/18/2013 1112   ALKPHOS 115 07/18/2013 1112   BILITOT 0.3 07/18/2013 1112   GFRNONAA 103 07/18/2013 1112   GFRAA 118 07/18/2013 1112    ASSESSMENT AND PLAN  53 y.o. year old female  has a past medical history of Anxiety; Depression, major; Fibromyalgia;  Hypersomnia; Hypothyroidism; Headache(784.0); Migraine headache;  Sleep apnea with use of continuous positive airway pressure (CPAP); she continues to have a lot of daytime drowsiness. She failed Adderall at 10 and 20 mg due to increased depression Provigil Nuvigil was not  effective. She has been on Ritalin by her psychiatrist for approximately 6 months. ESS today is 23. Her memory and concentration is still perceived as poor.  Discussed with Dr. Brett Fairy Add Nuvigil $RemoveBef'150mg'vGKLCuQXam$  daily RX for 1 free month with co pay card given Continue Daytrana Dr. Brett Fairy would like for 2nd opinion at [facility] Dr. Caprice Beaver F/U next visit with Dr. Brett Fairy Visit time face to face was 40 minutes discussing medication options and second opinion referral Dennie Bible, [facility], [facility], [facility] [address], [place], [place] [address] [phone]

## 2014-10-21 NOTE — Patient Instructions (Signed)
Discussed with Dr. Brett Fairy Add Nuvigil 150mg  daily RX for 1 free month Continue Daytrana She would like for 2nd opinion at [facility] Dr. Dwyane Dee F/U next visit with Dr. Brett Fairy

## 2014-10-22 NOTE — Progress Notes (Signed)
I agree with the assessment and plan as directed by NP .  We discussed the further work up and I suggested follow up at a tertiary care center , for example at [facility]  The patient is known to me .   Tammee Thielke, MD

## 2014-12-20 ENCOUNTER — Encounter: Payer: Self-pay | Admitting: Internal Medicine

## 2015-01-14 ENCOUNTER — Telehealth: Payer: Self-pay | Admitting: *Deleted

## 2015-01-15 ENCOUNTER — Ambulatory Visit: Payer: Self-pay | Admitting: Neurology

## 2015-01-15 NOTE — Telephone Encounter (Signed)
I spoke to pt and made appt for her 01-17-15 at 1515.

## 2015-01-21 ENCOUNTER — Encounter: Payer: Self-pay | Admitting: Neurology

## 2015-02-03 ENCOUNTER — Ambulatory Visit: Payer: Self-pay | Admitting: Neurology

## 2015-02-04 ENCOUNTER — Encounter: Payer: Self-pay | Admitting: Neurology

## 2015-02-05 ENCOUNTER — Ambulatory Visit (AMBULATORY_SURGERY_CENTER): Payer: Self-pay | Admitting: *Deleted

## 2015-02-05 VITALS — Ht 64.0 in | Wt 181.0 lb

## 2015-02-05 DIAGNOSIS — Z86010 Personal history of colonic polyps: Secondary | ICD-10-CM

## 2015-02-05 NOTE — Progress Notes (Signed)
Patient denies any allergies to eggs or soy. Patient request office visit with Dr.Perry before recall colonoscopy due to chronic constipation and narcolepsy. Patient is on narcotics. Patient states she does not think the laxative we give her will work. States last time she took "dulcolax" it did not work and she was sick for 2 days from it.  Patient agrees to see Wilhemina Cash. For 1st available.  Appointment made for April 12, 10 am, Colonoscopy cancelled at this time. Patient is aware.

## 2015-02-18 ENCOUNTER — Ambulatory Visit: Payer: BLUE CROSS/BLUE SHIELD | Admitting: Gastroenterology

## 2015-02-19 ENCOUNTER — Encounter: Payer: Self-pay | Admitting: Internal Medicine

## 2015-02-20 ENCOUNTER — Ambulatory Visit: Payer: BC Managed Care – PPO | Admitting: Neurology

## 2015-02-21 ENCOUNTER — Encounter: Payer: Self-pay | Admitting: *Deleted

## 2015-02-21 ENCOUNTER — Ambulatory Visit: Payer: BLUE CROSS/BLUE SHIELD | Admitting: Nurse Practitioner

## 2015-02-21 NOTE — Progress Notes (Unsigned)
Patient ID: [name], female   DOB: 03-Apr-1961, 54 y.o.   MRN: [id number] Per Tye Savoy NP-C, patient will have to reschedule her appointment. We did wait 30 minutes for the patient and she did not come for the appointment.

## 2015-04-01 ENCOUNTER — Encounter: Payer: Self-pay | Admitting: Neurology

## 2015-04-01 ENCOUNTER — Ambulatory Visit (INDEPENDENT_AMBULATORY_CARE_PROVIDER_SITE_OTHER): Payer: BLUE CROSS/BLUE SHIELD | Admitting: Neurology

## 2015-04-01 VITALS — BP 122/80 | HR 72 | Resp 20 | Ht 64.0 in | Wt 176.0 lb

## 2015-04-01 DIAGNOSIS — R410 Disorientation, unspecified: Secondary | ICD-10-CM | POA: Insufficient documentation

## 2015-04-01 DIAGNOSIS — R441 Visual hallucinations: Secondary | ICD-10-CM

## 2015-04-01 DIAGNOSIS — F05 Delirium due to known physiological condition: Secondary | ICD-10-CM

## 2015-04-01 DIAGNOSIS — R442 Other hallucinations: Secondary | ICD-10-CM

## 2015-04-01 DIAGNOSIS — G4733 Obstructive sleep apnea (adult) (pediatric): Secondary | ICD-10-CM | POA: Diagnosis not present

## 2015-04-01 DIAGNOSIS — R443 Hallucinations, unspecified: Secondary | ICD-10-CM | POA: Diagnosis not present

## 2015-04-01 DIAGNOSIS — Z9989 Dependence on other enabling machines and devices: Secondary | ICD-10-CM

## 2015-04-01 NOTE — Patient Instructions (Addendum)
Driving and Equipment Restrictions Some medical problems make it dangerous to drive, ride a bike, or use machines. Some of these problems are:  A hard blow to the head (concussion).  Passing out (fainting).  Twitching and shaking (seizures).  Low blood sugar.  Taking medicine to help you relax (sedatives).  Taking pain medicines.  Wearing an eye patch.  Wearing splints. This can make it hard to use parts of your body that you need to drive safely. HOME CARE   Do not drive until your doctor says it is okay.  Do not use machines until your doctor says it is okay. You may need a form signed by your doctor (medical release) before you can drive again. You may also need this form before you do other tasks where you need to be fully alert. MAKE SURE YOU:  Understand these instructions.  Will watch your condition.  Will get help right away if you are not doing well or get worse. Document Released: 12/02/2004 Document Revised: 01/17/2012 Document Reviewed: 03/04/2010 [facility] Patient Information 2015 [employer], [place]. This information is not intended to replace advice given to you by your health care provider. Make sure you discuss any questions you have with your health care provider. Psychosis Psychosis refers to a severe lack of understanding with reality. During a psychotic episode, you are not able to think clearly. During a psychotic episode, your responses and emotions are inappropriate and do not coincide with what is actually happening. You often have false beliefs about what is happening or who you are (delusions), and you may see, hear, taste, smell, or feel things that are not present (hallucinations). Psychosis is usually a severe symptom of a very serious mental health (psychiatric) condition, but it can sometimes be the result of a medical condition. CAUSES   Psychiatric conditions, such as:  Schizophrenia.  Bipolar disorder.  Depression.  Personality  disorders.  Alcohol or drug abuse.  Medical conditions, such as:  Brain injury.  Brain tumor.  Dementia.  Brain diseases, such as Alzheimer's, Parkinson's, or Huntington's disease.  Neurological diseases, such as epilepsy.  Genetic disorders.  Metabolic disorders.  Infections that affect the brain.  Certain prescription drugs.  Stroke. SYMPTOMS   Unable to think or speak clearly or respond appropriately.  Disorganized thinking (thoughts jump from one thought to another).  Severe inappropriate behavior.  Delusions may include:  A strong belief that is odd, unrealistic, or false.  Feeling extremely fearful or suspicious (paranoid).  Believing you are someone else, have high importance, or have an altered identity.  Hallucinations. DIAGNOSIS   Mental health evaluation.  Physical exam.  Blood tests.  Computerized magnetic scan (MRI) or other brain scans. TREATMENT  Your caregiver will recommend a course of treatment that depends on the cause of the psychosis. Treatment may include:  Monitoring and supportive care in the hospital.  Taking medicines (antipsychotic medicine) to reduce symptoms and balance chemicals in the brain.  Taking medicines to manage underlying mental health conditions.  Therapy and other supportive programs outside of the hospital.  Treating an underlying medical condition. If the cause of the psychosis can be treated or corrected, the outlook is good. Without treatment, psychotic episodes can cause danger to yourself or others. Treatment may be short-term or lifelong. HOME CARE INSTRUCTIONS   Take all medicines as directed. This is important.  Use a pillbox or write down your medicine schedule to make sure you are taking them.  Check with your caregiver before using over-the-counter medicines,  herbs, or supplements.  Seek individual and family support through therapy and mental health education (psychoeducation) programs.  These will help you manage symptoms and side effects of medicines, learn life skills, and maintain a healthy routine.  Maintain a healthy lifestyle.  Exercise regularly.  Avoid alcohol and drugs.  Learn ways to reduce stress and cope with stress, such as yoga and meditation.  Talk about your feelings with family members or caregivers.  Make time for yourself to do things you enjoy.  Know the early warning signs of psychosis. Your caregiver will recommend steps to take when you notice symptoms such as:  Feeling anxious or preoccupied.  Having racing thoughts.  Changes in your interest in life and relationships.  Follow up with your caregivers for continued outpatient treatment as directed. SEEK MEDICAL CARE IF:   Medicines do not seem to be helping.  You hear voices telling you to do things.  You see, smell, or feel things that are not there.  You feel hopeless and overwhelmed.  You feel extremely fearful and suspicious that something will harm you.  You feel like you cannot leave your house.  You have trouble taking care of yourself.  You experience side effects of medicines, such as changes in sleep patterns, dizziness, weight gain, restlessness, movement changes, muscle spasms, or tremors. SEEK IMMEDIATE MEDICAL CARE IF:  Severe psychotic symptoms present a safety issue (such as an urge to hurt yourself or others). MAKE SURE YOU:   Understand these instructions.  Will watch your condition.  Will get help right away if you are not doing well or get worse. [employer] of Mental Health: [email or web address] Document Released: 04/14/2010 Document Revised: 01/17/2012 Document Reviewed: 04/14/2010 [facility] Patient Information 2015 [place], [place]. This information is not intended to replace advice given to you by your health care provider. Make sure you discuss any questions you have with your health care provider.

## 2015-04-01 NOTE — Progress Notes (Signed)
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 She has a history of excessive daytime sleepiness. She is currently on methylphenidate patch by her psychiatrist. She has tried Provigil in the past but still had problems with sleepiness. She is using CPAP and was 100% compliant at last check with Dr. Brett Fairy 03/05/2014. Her Epworth remains high at 23 today. Her fatigue severity scale is 59 .Her depression worsened on Adderall and her psychiatrist switched her to Ritalin which she has been taking about 6 months . She was  on Nuvigil 250 at one time but has been off of that medication for 6 months.  She reports that she has had one episode per week since being on the Ritalin when she fell asleep standing up and caught herself as she was falling. She also feels she has had more confusion (Cognitive impairment)over the last 6 months .she recently had hand surgery on the right and is currently getting some physical therapy. She is on narcotic medications. She claims she is sleeping 7-8 hours at night but takes a nap 2-3 hours at least once or twice a week. She is still having vivid dreams. Dr. Brett Fairy had wanted to put her on Xyrem but her insurance would not cover the medication. She returns for reevaluation   Interval history 04-01-15, CD  [name] remains very sleepy excessively daytime sleepy on her current medication her Epworth sleepiness score was 21 points and her fatigue severity score 59. She is compliant with CPAP and her download in office today documented 96.7% compliance she uses the machine on average for 4 hours and 52 minutes her average AHI is 6.3 which is slightly elevated her CPAP pressure is 7 cm water. Date of the download is 03-31-15.    HISTORY: excessive daytime sleepiness review  sleep consultation  06-01-2013.  The patient has had some benefit (temporarly) from Provigil but needs still out of well in daytime to help her operate especially a car safely. She has tested negative for the HLA related genetic narcolepsy, but she remains excessively daytime sleepy after 100 days of CPAP he was as high compliance and reduction of the AHI to single digits 6.2 on average. Her Epworth today was 19 points last visit 21 points of fatigue severity scale is endorsed 57 point which is only marginal reduced last visit. The patient hd endorsed the Olevia Bowens s inventory at 74 points!  I am tempted to treat the patient with XYREM in light of persitent hypersomnia in a situation with CPAP controlled obstructive sleep apnea.  But XYREM can worsen depression and isolated cases, and it should not be used with narcotic pain medications. She relates to me today that she has not used hydrocodone acetaminophen that was prescribed for when necessary use and therefore considers his health not needing the medication. I think it would be worthwhile to give Xyrem a trial in this patient. Given that she remains on antidepressant medications and more lactic atypical medications are a feel that a MSLT would still be invalid.  HLA testing was performed, and negative.     REVIEW OF SYSTEMS: Full 14 system review of systems performed and notable only for those listed, all others are neg:   Respiratory: Cough shortness of breath  Musculoskeletal: Back pain, aching muscles, muscle cramps, neck stiffness Allergy/Immunology: No known allergies.  Neurological: Memory loss Psychiatric:  Decreased concentration, disorientation. After waking up from a nap, not in the morning. She she feels sometimes that she doesn't recognize her surroundings doesn't know why she is there or where she is. She reports hallucinations visual hallucinations not acoustic. At times she also has been hearing things. Then feeling fatigued and drowsy she has seen ""  couple of goals, she also has been talking to people that and reality were not there. Upon dreams. She stated that her by trauma patch has helped her and usually to stay awake better in daytime she forgot to put it on when she went to a concert at the [facility] and in spite of the loud music she fell asleep in the concert. Also feels decreased balance and progressively decreased balance. She reports falling and stumbling. Diffuse pains. Stiffness in the morning.  Sleep : Acting out her dreams,  Excessive fatigue and daytime sleepiness. Adverse 21 points fatigue severity score 59 points.  This patient is not allowed to drive due to confusion and sleepiness.   ALLERGIES: Allergies  Allergen Reactions  . Sulfonamide Derivatives Hives    fever    HOME MEDICATIONS: Outpatient Prescriptions Prior to Visit  Medication Sig Dispense Refill  . ARIPiprazole (ABILIFY) 2 MG tablet Take 15 mg by mouth daily.     . benztropine (COGENTIN) 0.5 MG tablet Take 1 mg by mouth at bedtime.     . citalopram (CELEXA) 40 MG tablet Take 40 mg by mouth daily.      Marland Kitchen gabapentin (NEURONTIN) 300 MG capsule Take 600 mg by mouth 2 (two) times daily.     . methylphenidate (DAYTRANA) 20 MG/9HR Place 1 patch onto the skin daily. wear patch for 9 hours only each day    . Multiple Vitamins-Minerals (MULTIVITAMIN WITH MINERALS) tablet Take 1 tablet by mouth daily.    . naproxen (NAPROSYN) 500 MG tablet as needed.    . Nutritional Supplements (MELATONIN PO) Take by mouth Nightly.      Marland Kitchen oxyCODONE-acetaminophen (PERCOCET/ROXICET) 5-325 MG per tablet Take by mouth every 4 (four) hours as needed for severe pain.    Marland Kitchen propranolol ER (INDERAL LA) 120 MG 24 hr capsule Take 120 mg by mouth 2 (two) times daily. Takes for anxiety    . Tapentadol HCl (NUCYNTA PO) Take 250 mg by mouth 2 (two) times daily. For pain    . Armodafinil 150 MG tablet Take 1 tablet (150 mg total) by mouth daily. (Patient not taking: Reported on 04/01/2015) 30 tablet  0   No facility-administered medications prior to visit.    PAST MEDICAL HISTORY: Past Medical History  Diagnosis Date  . Anxiety   . Depression, major     suicidal thoughts -implants, mental health, 05/11  . Fibromyalgia   . Breast reconstruction deformity   . Hypersomnia     sleep attacks  . Hypothyroidism   . Headache(784.0)   . Migraine headache   . Neoplasm     malignant breast, history of status post bilateral  . Breast cancer     rt breast  and left breast breast removed  . Sleep apnea with use of continuous positive airway pressure (CPAP)     AHI 20 in HST, titration to 6 cm water - residual AHi 6 . remaining hypersomnic.   Marland Kitchen Complication of anesthesia     shaking-after mastectomies-resp and hr dropped  . Post-operative nausea and vomiting   . Sleep apnea     CPAP  . Narcolepsy  PAST SURGICAL HISTORY: Past Surgical History  Procedure Laterality Date  . Hysterestomy  2005  . Carpal tunnel release  2011    both hands  . Wisdom teeth out    . Btl    . Breast reconstructioin  7/09    flaps-bilat mastectomies  . Colonoscopy w/ polypectomy  4/03    tubulovillous adenoma  . Post- a -cath  2008  . Mastectomy Bilateral 2008  . Ganglion cyst excision      both [phone]  . Finger arthroscopy with carpometacarpel (cmc) arthroplasty Right 06/20/2014    Procedure: ARTHROSCOPY RIGHT THUMB CARPOMETACARPEL GRAFT JACKET INTERPOSITION PARTIAL TRAPEZIECTOMY POSSIBLE OPEN;  Surgeon: Daryll Brod, MD;  Location: [facility];  Service: Orthopedics;  Laterality: Right;  . Carpometacarpel suspension plasty Right 08/29/2014    Procedure: REMOVAL DERMASPAN SUSPENSIONPLASTY RIGHT THUMB ABDUCTOR POLLIS TRANSFER;  Surgeon: Daryll Brod, MD;  Location: [facility];  Service: Orthopedics;  Laterality: Right;  . Trigger finger release Right 08/29/2014    Procedure: RELEASE TRIGGER FINGER/A-1 PULLEY;  Surgeon: Daryll Brod, MD;  Location: [facility];   Service: Orthopedics;  Laterality: Right;    FAMILY HISTORY: Family History  Problem Relation Age of Onset  . Cancer Mother     breast  . Cancer Sister     breast  . Cancer Maternal Aunt     breast  . Colon cancer Maternal Grandmother     age unknown, ? 48's  . Colon cancer Cousin     SOCIAL HISTORY: History   Social History  . Marital Status: Married    Spouse Name: Leory Plowman  . Number of Children: 3  . Years of Education: college   Occupational History  . disabled     Currently in [place] at  [employer] Topics  . Smoking status: Current Every Day Smoker -- 0.25 packs/day    Types: Cigarettes    Last Attempt to Quit: 06/14/2012  . Smokeless tobacco: Never Used     Comment: quit smoking in 1999  . Alcohol Use: Yes     Comment: glass wine or beer "every once in a while" per pt.  . Drug Use: No  . Sexual Activity: Not on file   Other Topics Concern  . Not on file   Social History Narrative   Patient  Is married Leory Plowman) and lives at home with her husband.   Patient has 3 children.    Patient is disabled.    Patient has a college education.    Patient is right-handed.   Patient drinks one cup of coffee daily and one coke per day.     PHYSICAL EXAM  Filed Vitals:   04/01/15 1536  BP: 122/80  Pulse: 72  Resp: 20  Height: 5' 4" (1.626 m)  Weight: 176 lb (79.833 kg)   Body mass index is 30.2 kg/(m^2). Generalized: Well developed, in no acute distress  Head: normocephalic and atraumatic,. Oropharynx benign .   Neck: Supple, no carotid bruits  Cardiac: Regular rate rhythm, no murmur.  Musculoskeletal: No deformity   Neurological examination   Mentation: Alert oriented to time, place, history taking. Follows all commands speech and language fluent. ESS 23. FSS 59.   Cranial nerve II-XII: Pupils were equal round reactive to light extraocular movements were full, visual field were full on confrontational test. Facial sensation  and strength were normal. hearing was intact to finger rubbing bilaterally. Uvula tongue midline. head turning and shoulder  shrug were normal and symmetric.Tongue protrusion into cheek strength was normal. Motor: normal bulk and tone, full strength in the BUE, BLE, fine finger movements normal, no pronator drift. No focal weakness, mild decreased grip right hand. Sensory: normal and symmetric to light touch, pinprick, and vibration  Coordination: finger-nose-finger, heel-to-shin bilaterally, no dysmetria Reflexes: Brachioradialis 2/2, biceps 2/2, triceps 2/2, patellar 2/2, Achilles 2/2, plantar responses were flexor bilaterally. Gait and Station: Rising up from seated position without assistance, normal stance, moderate stride, good arm swing, smooth turning, able to perform tiptoe, and heel walking without difficulty. Tandem gait is unsteady, she walks on the outside of her feet.    DIAGNOSTIC DATA (LABS, IMAGING, TESTING) - I reviewed patient records, labs, notes, testing and imaging myself where available. This patient needs to have a memory test with every visit.   Lab Results  Component Value Date   WBC 5.7 04/28/2011   HGB 18.9* 08/29/2014   HCT 38.0 04/28/2011   MCV 86.1 04/28/2011   PLT 192 04/28/2011      Component Value Date/Time   NA 131* 07/18/2013 1112   NA 138 10/11/2011 1339   K 4.6 07/18/2013 1112   CL 91* 07/18/2013 1112   CO2 30* 07/18/2013 1112   GLUCOSE 107* 07/18/2013 1112   GLUCOSE 111* 10/11/2011 1339   BUN 9 07/18/2013 1112   BUN 14 10/11/2011 1339   CREATININE 0.63 07/18/2013 1112   CALCIUM 10.0 07/18/2013 1112   PROT 6.6 07/18/2013 1112   PROT 6.6 10/11/2011 1339   ALBUMIN 3.8 10/11/2011 1339   AST 23 07/18/2013 1112   ALT 22 07/18/2013 1112   ALKPHOS 115 07/18/2013 1112   BILITOT 0.3 07/18/2013 1112   GFRNONAA 103 07/18/2013 1112   GFRAA 118 07/18/2013 1112    ASSESSMENT AND PLAN  54 y.o. year old female  : f Anxiety; Depression, major;  Fibromyalgia;  Hypersomnia; Hypothyroidism; Headache(784.0); Migraine headache;   Sleep apnea with use of continuous positive airway pressure (CPAP); she continues to have a lot of daytime drowsiness.  Usual and forgetfulness. MMSE performed on 04-01-15 revealed no los of memory ( albeit this is a screening test only) , she missed 2 out of 30 points, 1 in recall , one on date. And her drawing was tremor affected.    She failed Adderall at 10 and 20 mg due to increased depression,  Provigil Nuvigil was not effective.  She has been on Ritalin by her psychiatrist for approximately 10 months.   ESS today is 23.  Her memory and concentration is still perceived as poor.   Dr Brigitte Pulse performed a CMET, will ask for results.    Discussed between NP and Dr. Brett Fairy Add Nuvigil 139m daily RX for 1 free month with co pay card given Continue Daytrana I  [facility] next visit in 6 month with NP, and 30 minute time reservation.  .  Visit time face to face was 40 minutes discussing medication options and second opinion referral, she needs a MMSE/ MOCA with every visit in PCP,  psychiatry ( [facility] Moss MD ) and [facility]      [facility] [address] [place] [phone]

## 2015-04-02 ENCOUNTER — Other Ambulatory Visit: Payer: Self-pay

## 2015-04-02 DIAGNOSIS — R443 Hallucinations, unspecified: Secondary | ICD-10-CM

## 2015-04-02 LAB — COMPREHENSIVE METABOLIC PANEL
A/G RATIO: 2 (ref 1.1–2.5)
ALBUMIN: 4.5 g/dL (ref 3.5–5.5)
ALK PHOS: 86 IU/L (ref 39–117)
ALT: 17 IU/L (ref 0–32)
AST: 17 IU/L (ref 0–40)
BUN / CREAT RATIO: 15 (ref 9–23)
BUN: 12 mg/dL (ref 6–24)
Bilirubin Total: <0.2 mg/dL (ref 0.0–1.2)
CALCIUM: 9.9 mg/dL (ref 8.7–10.2)
CHLORIDE: 101 mmol/L (ref 97–108)
CO2: 27 mmol/L (ref 18–29)
CREATININE: 0.82 mg/dL (ref 0.57–1.00)
GFR calc Af Amer: 93 mL/min/{1.73_m2} (ref >59)
GFR calc non Af Amer: 81 mL/min/{1.73_m2} (ref >59)
Globulin, Total: 2.3 g/dL (ref 1.5–4.5)
Glucose: 104 mg/dL — ABNORMAL HIGH (ref 65–99)
Potassium: 4.6 mmol/L (ref 3.5–5.2)
Sodium: 145 mmol/L — ABNORMAL HIGH (ref 134–144)
Total Protein: 6.8 g/dL (ref 6.0–8.5)

## 2015-04-02 LAB — CBC WITH DIFFERENTIAL/PLATELET
BASOS ABS: 0 10*3/uL (ref 0.0–0.2)
Basos: 0 %
EOS (ABSOLUTE): 0.2 10*3/uL (ref 0.0–0.4)
Eos: 3 %
Hematocrit: 41.7 % (ref 34.0–46.6)
Hemoglobin: 13.8 g/dL (ref 11.1–15.9)
IMMATURE GRANULOCYTES: 0 %
Immature Grans (Abs): 0 10*3/uL (ref 0.0–0.1)
LYMPHS ABS: 2.8 10*3/uL (ref 0.7–3.1)
LYMPHS: 39 %
MCH: 28.5 pg (ref 26.6–33.0)
MCHC: 33.1 g/dL (ref 31.5–35.7)
MCV: 86 fL (ref 79–97)
MONOS ABS: 0.5 10*3/uL (ref 0.1–0.9)
Monocytes: 7 %
NEUTROS ABS: 3.6 10*3/uL (ref 1.4–7.0)
NEUTROS PCT: 51 %
Platelets: 239 10*3/uL (ref 150–379)
RBC: 4.85 x10E6/uL (ref 3.77–5.28)
RDW: 13.6 % (ref 12.3–15.4)
WBC: 7.1 10*3/uL (ref 3.4–10.8)

## 2015-04-02 NOTE — Progress Notes (Signed)
Pt needs to be seen urgently by psychiatry. Dr. Tomasita Crumble McKinney's PA, Pauline Good, able to see pt tomorrow. Put in order for referral to them.

## 2015-04-08 ENCOUNTER — Telehealth: Payer: Self-pay

## 2015-04-08 NOTE — Telephone Encounter (Signed)
-----   Message from Larey Seat, MD sent at 04/04/2015  3:56 PM EDT ----- High non fast glucose and higher sodium. Normal liver and kidney function. CBC -cell count of white and red Blood cells are normal, too. No abnormality noted.

## 2015-04-08 NOTE — Telephone Encounter (Signed)
Called pt to give her lab results. No answer. Left message on mobile asking her to call me back.

## 2015-04-08 NOTE — Telephone Encounter (Signed)
Patient called you back about her lab results.  Please call. Thanks!

## 2015-04-08 NOTE — Telephone Encounter (Signed)
Gave pt her lab results when she returned my call. Verbalized understanding. Asking when her MRI is going to be scheduled. Will ask appropriate person.

## 2015-04-11 ENCOUNTER — Telehealth: Payer: Self-pay

## 2015-04-11 NOTE — Telephone Encounter (Signed)
Called to follow up on referral.

## 2015-04-15 ENCOUNTER — Encounter: Payer: Self-pay | Admitting: Internal Medicine

## 2015-04-16 ENCOUNTER — Ambulatory Visit (INDEPENDENT_AMBULATORY_CARE_PROVIDER_SITE_OTHER): Payer: BLUE CROSS/BLUE SHIELD

## 2015-04-16 DIAGNOSIS — R442 Other hallucinations: Secondary | ICD-10-CM

## 2015-04-16 DIAGNOSIS — R443 Hallucinations, unspecified: Secondary | ICD-10-CM

## 2015-04-16 DIAGNOSIS — F05 Delirium due to known physiological condition: Secondary | ICD-10-CM | POA: Diagnosis not present

## 2015-04-16 DIAGNOSIS — R410 Disorientation, unspecified: Secondary | ICD-10-CM

## 2015-04-16 DIAGNOSIS — G4733 Obstructive sleep apnea (adult) (pediatric): Secondary | ICD-10-CM

## 2015-04-16 DIAGNOSIS — Z9989 Dependence on other enabling machines and devices: Secondary | ICD-10-CM

## 2015-04-16 DIAGNOSIS — R441 Visual hallucinations: Secondary | ICD-10-CM

## 2015-04-16 MED ORDER — GADOPENTETATE DIMEGLUMINE 469.01 MG/ML IV SOLN: 17.0000 mL | Freq: Once | INTRAVENOUS | Status: AC | PRN

## 2015-04-23 ENCOUNTER — Telehealth: Payer: Self-pay

## 2015-04-23 NOTE — Telephone Encounter (Signed)
Called [name] to inform her that her MRI was normal. [name] verbalized understanding. She informed me that she is seeing the psychiatrist recommended by Dr. Brett Fairy and they are adjusting her medications.

## 2015-05-26 ENCOUNTER — Encounter: Payer: Self-pay | Admitting: Genetic Counselor

## 2016-01-08 ENCOUNTER — Ambulatory Visit (HOSPITAL_COMMUNITY): Payer: BLUE CROSS/BLUE SHIELD | Attending: Orthopaedic Surgery | Admitting: Occupational Therapy

## 2016-01-08 ENCOUNTER — Encounter (HOSPITAL_COMMUNITY): Payer: Self-pay | Admitting: Occupational Therapy

## 2016-01-08 DIAGNOSIS — M6281 Muscle weakness (generalized): Secondary | ICD-10-CM | POA: Diagnosis present

## 2016-01-08 DIAGNOSIS — M629 Disorder of muscle, unspecified: Secondary | ICD-10-CM | POA: Insufficient documentation

## 2016-01-08 DIAGNOSIS — M25612 Stiffness of left shoulder, not elsewhere classified: Secondary | ICD-10-CM | POA: Insufficient documentation

## 2016-01-08 DIAGNOSIS — M25512 Pain in left shoulder: Secondary | ICD-10-CM | POA: Diagnosis present

## 2016-01-08 DIAGNOSIS — M6289 Other specified disorders of muscle: Secondary | ICD-10-CM

## 2016-01-08 NOTE — Patient Instructions (Signed)
SHOULDER: Flexion On Table   Place hands on table, elbows straight. Move hips away from body. Press hands down into table.  _10-15__ reps per set, _1-2__ sets per day  Abduction (Passive)   With arm out to side, resting on table, lower head toward arm, keeping trunk away from table.  Repeat _10-15___ times. Do _1-2___ sessions per day.  Copyright  VHI. All rights reserved.     Internal Rotation (Assistive)   Seated with elbow bent at right angle and held against side, slide arm on table surface in an inward arc. Repeat __10-15__ times. Do _1-2___ sessions per day. Activity: Use this motion to brush crumbs off the table.  Copyright  VHI. All rights reserved.    

## 2016-01-08 NOTE — Therapy (Addendum)
[place], [place], [address] Phone: [phone]   Fax:  [phone]  Occupational Therapy Evaluation  Patient Details  Name: [name] MRN: [id number] Date of Birth: 25-Jun-1961 Referring Provider: Dr. Jean Rosenthal  Encounter Date: 01/08/2016      OT End of Session - 01/08/16 1621    Visit Number 1   Number of Visits 8   Date for OT Re-Evaluation 02/07/16   Authorization Type BCBS   Authorization Time Period based on medical necessity   OT Start Time 1015   OT Stop Time 1055   OT Time Calculation (min) 40 min   Activity Tolerance Patient tolerated treatment well   Behavior During Therapy [facility] for tasks assessed/performed      Past Medical History  Diagnosis Date  . Anxiety   . Depression, major ([place])     suicidal thoughts -implants, mental health, 05/11  . Fibromyalgia   . Breast reconstruction deformity   . Hypersomnia     sleep attacks  . Hypothyroidism   . Headache(784.0)   . Migraine headache   . Neoplasm     malignant breast, history of status post bilateral  . Breast cancer ([place])     rt breast  and left breast breast removed  . Sleep apnea with use of continuous positive airway pressure (CPAP)     AHI 20 in HST, titration to 6 cm water - residual AHi 6 . remaining hypersomnic.   Marland Kitchen Complication of anesthesia     shaking-after mastectomies-resp and hr dropped  . Post-operative nausea and vomiting   . Sleep apnea     CPAP  . Narcolepsy     Past Surgical History  Procedure Laterality Date  . Hysterestomy  2005  . Carpal tunnel release  2011    both hands  . Wisdom teeth out    . Btl    . Breast reconstructioin  7/09    flaps-bilat mastectomies  . Colonoscopy w/ polypectomy  4/03    tubulovillous adenoma  . Post- a -cath  2008  . Mastectomy Bilateral 2008  . Ganglion cyst excision      both [phone]  . Finger arthroscopy with carpometacarpel (cmc) arthroplasty Right  06/20/2014    Procedure: ARTHROSCOPY RIGHT THUMB CARPOMETACARPEL GRAFT JACKET INTERPOSITION PARTIAL TRAPEZIECTOMY POSSIBLE OPEN;  Surgeon: Daryll Brod, MD;  Location: [facility];  Service: Orthopedics;  Laterality: Right;  . Carpometacarpel suspension plasty Right 08/29/2014    Procedure: REMOVAL DERMASPAN SUSPENSIONPLASTY RIGHT THUMB ABDUCTOR POLLIS TRANSFER;  Surgeon: Daryll Brod, MD;  Location: [facility];  Service: Orthopedics;  Laterality: Right;  . Trigger finger release Right 08/29/2014    Procedure: RELEASE TRIGGER FINGER/A-1 PULLEY;  Surgeon: Daryll Brod, MD;  Location: [facility];  Service: Orthopedics;  Laterality: Right;    There were no vitals filed for this visit.  Visit Diagnosis:  Left shoulder pain  Stiffness of left shoulder joint  Tight fascia  Muscle weakness of left upper extremity      Subjective Assessment - 01/08/16 1256    Subjective  S: The pain began a couple of months ago, after Christmas.    Pertinent History Pt is a 55 y/o female with left shoulder pain that began in January after her dog jerked on her arm when trying to chase after a moped. Pt attends pain clinic for chronic pain due to fibromyalgia, is currently taking pain medications and has tried ice and  heat occasionally; received cortisone shot on 12/29/15. Dr. Jean Rosenthal referred pt to occupational therapy for evaluation and treatment.    Patient Stated Goals To have less pain in my arm.   Currently in Pain? Yes   Pain Score 5    Pain Location Shoulder   Pain Orientation Left   Pain Descriptors / Indicators Aching   Pain Type Acute pain   Pain Radiating Towards elbow   Pain Onset More than a month ago   Pain Frequency Constant   Aggravating Factors  movement, reaching   Pain Relieving Factors pain medications   Effect of Pain on Daily Activities limited ability to perform ADL tasks   Multiple Pain Sites No           [facility] OT Assessment -  01/08/16 1015    Assessment   Diagnosis left shoulder pain   Referring Provider Dr. Jean Rosenthal   Onset Date 11/10/15   Prior Therapy none   Precautions   Precautions None   Balance Screen   Has the patient fallen in the past 6 months Yes   How many times? 1   Has the patient had a decrease in activity level because of a fear of falling?  No   Is the patient reluctant to leave their home because of a fear of falling?  No   Home  Environment   Family/patient expects to be discharged to: Private residence   Living Arrangements Spouse/significant other   Lives With Spouse   Prior Function   Level of [place] with basic ADLs   Vocation On disability   Leisure gardening, walking dog   ADL   ADL comments Pt is having difficulty with dressing tasks, especially bra, bathing tasks, reaching into cabinets, lifting objects, cooking tasks.    Written Expression   Dominant Hand Right   Vision - History   Baseline Vision Wears glasses only for reading   Cognition   Overall Cognitive Status Within Functional Limits for tasks assessed   ROM / Strength   AROM / PROM / Strength AROM;PROM;Strength   Palpation   Palpation comment Max fascial restrictions in left upper arm, trapezius, and scapularis regions   AROM   Overall AROM Comments Assessed seated, ER/IR adducted   AROM Assessment Site Shoulder   Right/Left Shoulder Left   Left Shoulder Flexion 92 Degrees   Left Shoulder ABduction 70 Degrees   Left Shoulder Internal Rotation 90 Degrees   Left Shoulder External Rotation 44 Degrees   PROM   Overall PROM Comments Assessed supine, ER/IR adducted  pt with max difficulty relaxing arm/allowing manual stretch   PROM Assessment Site Shoulder   Right/Left Shoulder Left   Left Shoulder Flexion 80 Degrees   Left Shoulder ABduction 64 Degrees   Left Shoulder Internal Rotation 90 Degrees   Left Shoulder External Rotation 20 Degrees   Strength   Overall Strength Comments  Assessed seated, ER/IR adducted   Strength Assessment Site Shoulder   Right/Left Shoulder Left   Left Shoulder Flexion 3/5   Left Shoulder ABduction 3-/5   Left Shoulder Internal Rotation 3/5   Left Shoulder External Rotation 3/5                         OT Education - 01/08/16 1620    Education provided Yes   Education Details table slides   Person(s) Educated Patient   Methods Explanation;Demonstration;Handout   Comprehension Verbalized understanding;Returned demonstration  OT Short Term Goals - 01/08/16 1626    OT SHORT TERM GOAL #1   Title Pt will be educated on and independent in HEP.    Time 4   Period Weeks   Status New   OT SHORT TERM GOAL #2   Title Pt will return to prior level of functioning and independence during daily tasks.    Time 4   Period Weeks   Status New   OT SHORT TERM GOAL #3   Title Pt will decrease fascial restrictions in LUE from max to moderate amounts or less to increase LUE mobility.    Time 4   Period Weeks   Status New   OT SHORT TERM GOAL #4   Title Pt will decrease pain to 3/10 or less to increase ability to perform daily tasks.    Time 4   Period Weeks   Status New   OT SHORT TERM GOAL #5   Title Pt will increase LUE A/ROM to [facility] to increase ability to donn shirts.    Time 4   Period Weeks   Status New   Additional Short Term Goals   Additional Short Term Goals Yes   OT SHORT TERM GOAL #6   Title Pt will increase LUE strength to 4/5 to increase ability to carry a gallon of tea.    Time 4   Period Weeks   Status New                  Plan - 01/08/16 1622    Clinical Impression Statement A: Pt is a 55 y/o female presenting with increased pain and fascial restrictions, decreased range of motion and strength in LUE limiting ability to perform daily tasks independently. Pt was provided with table slide exercises this session for HEP. Pt is required to attend therapy prior to insurance approving an  MRI.    Pt will benefit from skilled therapeutic intervention in order to improve on the following deficits (Retired) Decreased strength;Pain;Impaired UE functional use;Decreased activity tolerance;Decreased range of motion;Increased fascial restricitons;Impaired flexibility   Rehab Potential Good   OT Frequency 2x / week   OT Duration 4 weeks   OT Treatment/Interventions Self-care/ADL training;Passive range of motion;Patient/family education;Cryotherapy;Electrical Stimulation;Moist Heat;Therapeutic exercise;Manual Therapy;Therapeutic activities   Plan P: Pt will benefit from skilled OT intervention to decrease pain and fascial restrictions, increase range of motion and strength of LUE increasing ability to perform functional tasks. Treatment plan: myofascial release, manual therapy, P/ROM, AA/ROM, A/ROM, scapular stability and strengthening, general LUE strengthening, modalitities including iontophoresis per MD order    OT Home Exercise Plan table slides   Consulted and Agree with Plan of Care Patient      G-Codes: Carrying, Moving, and Handling Objects Current status: CL: At least 60 percent but less than 80 percent limited, impaired, or restricted.  Goal Status: CJ: At least 20 percent but less than 40 percent limited, impaired, or restricted.   Problem List Patient Active Problem List   Diagnosis Date Noted  . OSA on CPAP 04/01/2015  . Subacute confusional state 04/01/2015  . Hallucinations, visual 04/01/2015  . Hypnapompic hallucinations 04/01/2015  . Sleep apnea with use of continuous positive airway pressure (CPAP)   . Hypersomnia, persistent 03/30/2013  . Obstructive sleep apnea (adult) (pediatric) 03/30/2013  . CONSTIPATION 11/18/2008  . RECTAL BLEEDING 11/18/2008  . HEMORRHOIDS, INTERNAL 11/15/2008  . DIVERTICULOSIS, COLON 11/15/2008  . CELLULITIS/ABSCESS, TRUNK 02/22/2008  . LOW BACK PAIN 02/22/2008  . DIZZINESS 02/22/2008  .  FATIGUE 02/22/2008  . PALPITATIONS 02/22/2008   . MURMUR, CARDIAC, UNDIAGNOSED 02/22/2008  . BREAST CANCER, HX OF 02/22/2008  . COUGH 01/05/2008  . DEPRESSION 07/05/2007  . UTERINE POLYP 07/05/2007  . FIBROMYALGIA 07/05/2007  . COLONIC POLYPS, HX OF 07/05/2007  . HX, PERSONAL, CERVICAL DYSPLASIA 07/05/2007    Guadelupe Sabin, OTR/L  [phone]  01/08/2016, 4:31 PM  [facility] [address] [place], [place], [address] Phone: [phone]   Fax:  [phone]  Name: [name] MRN: [id number] Date of Birth: Mar 30, 1961

## 2016-01-12 ENCOUNTER — Ambulatory Visit (HOSPITAL_COMMUNITY): Payer: BLUE CROSS/BLUE SHIELD | Admitting: Occupational Therapy

## 2016-01-13 ENCOUNTER — Encounter (HOSPITAL_COMMUNITY): Payer: BLUE CROSS/BLUE SHIELD

## 2016-01-15 ENCOUNTER — Encounter (HOSPITAL_COMMUNITY): Payer: Self-pay

## 2016-01-15 ENCOUNTER — Ambulatory Visit (HOSPITAL_COMMUNITY): Payer: BLUE CROSS/BLUE SHIELD

## 2016-01-15 DIAGNOSIS — M629 Disorder of muscle, unspecified: Secondary | ICD-10-CM

## 2016-01-15 DIAGNOSIS — M6281 Muscle weakness (generalized): Secondary | ICD-10-CM

## 2016-01-15 DIAGNOSIS — M25612 Stiffness of left shoulder, not elsewhere classified: Secondary | ICD-10-CM

## 2016-01-15 DIAGNOSIS — M25512 Pain in left shoulder: Secondary | ICD-10-CM

## 2016-01-15 DIAGNOSIS — M6289 Other specified disorders of muscle: Secondary | ICD-10-CM

## 2016-01-15 NOTE — Therapy (Signed)
[place], [place], [address] Phone: [phone]   Fax:  [phone]  Occupational Therapy Treatment  Patient Details  Name: [name] MRN: [id number] Date of Birth: [birth year] Referring Provider: Dr. Jean Rosenthal  Encounter Date: 01/15/2016      OT End of Session - 01/15/16 1742    Visit Number 2   Number of Visits 8   Date for OT Re-Evaluation 02/07/16   Authorization Type BCBS   Authorization Time Period based on medical necessity   OT Start Time 1520   OT Stop Time 1605   OT Time Calculation (min) 45 min   Activity Tolerance Patient tolerated treatment well   Behavior During Therapy [facility] for tasks assessed/performed      Past Medical History  Diagnosis Date  . Anxiety   . Depression, major ([place])     suicidal thoughts -implants, mental health, 05/11  . Fibromyalgia   . Breast reconstruction deformity   . Hypersomnia     sleep attacks  . Hypothyroidism   . Headache(784.0)   . Migraine headache   . Neoplasm     malignant breast, history of status post bilateral  . Breast cancer ([place])     rt breast  and left breast breast removed  . Sleep apnea with use of continuous positive airway pressure (CPAP)     AHI 20 in HST, titration to 6 cm water - residual AHi 6 . remaining hypersomnic.   Marland Kitchen Complication of anesthesia     shaking-after mastectomies-resp and hr dropped  . Post-operative nausea and vomiting   . Sleep apnea     CPAP  . Narcolepsy     Past Surgical History  Procedure Laterality Date  . Hysterestomy  2005  . Carpal tunnel release  2011    both hands  . Wisdom teeth out    . Btl    . Breast reconstructioin  7/09    flaps-bilat mastectomies  . Colonoscopy w/ polypectomy  4/03    tubulovillous adenoma  . Post- a -cath  2008  . Mastectomy Bilateral 2008  . Ganglion cyst excision      both [phone]  . Finger arthroscopy with carpometacarpel (cmc) arthroplasty Right  06/20/2014    Procedure: ARTHROSCOPY RIGHT THUMB CARPOMETACARPEL GRAFT JACKET INTERPOSITION PARTIAL TRAPEZIECTOMY POSSIBLE OPEN;  Surgeon: Daryll Brod, MD;  Location: [facility];  Service: Orthopedics;  Laterality: Right;  . Carpometacarpel suspension plasty Right 08/29/2014    Procedure: REMOVAL DERMASPAN SUSPENSIONPLASTY RIGHT THUMB ABDUCTOR POLLIS TRANSFER;  Surgeon: Daryll Brod, MD;  Location: [facility];  Service: Orthopedics;  Laterality: Right;  . Trigger finger release Right 08/29/2014    Procedure: RELEASE TRIGGER FINGER/A-1 PULLEY;  Surgeon: Daryll Brod, MD;  Location: [facility];  Service: Orthopedics;  Laterality: Right;    There were no vitals filed for this visit.  Visit Diagnosis:  Left shoulder pain  Stiffness of left shoulder joint  Tight fascia  Muscle weakness of left upper extremity      Subjective Assessment - 01/15/16 1602    Subjective  S: My muscles just twitch a lot and I don't know why.    Currently in Pain? Yes   Pain Score 7    Pain Location Shoulder   Pain Orientation Left   Pain Descriptors / Indicators Aching   Pain Type Acute pain   Pain Radiating Towards neck down to shoulder   Pain Onset More than a  month ago   Pain Frequency Constant   Aggravating Factors  Movement, reaching   Pain Relieving Factors pain medications   Effect of Pain on Daily Activities unable to complete daily tasks or is extremely limited   Multiple Pain Sites No            [facility] OT Assessment - 01/15/16 1524    Assessment   Diagnosis left shoulder pain   Precautions   Precautions None                  OT Treatments/Exercises (OP) - 01/15/16 1546    Exercises   Exercises Shoulder   Shoulder Exercises: Supine   Protraction PROM;10 reps   Horizontal ABduction PROM;10 reps   External Rotation PROM;10 reps   Internal Rotation PROM;10 reps   Flexion PROM;10 reps   ABduction PROM;10 reps   Modalities    Modalities Moist Heat;Electrical Stimulation   Moist Heat Therapy   Number Minutes Moist Heat 10 Minutes   Moist Heat Location Shoulder   Electrical Stimulation   Electrical Stimulation Location Left shoulder   Electrical Stimulation Action interferential   Electrical Stimulation Parameters 10 CV   Electrical Stimulation Goals Pain   Manual Therapy   Manual Therapy Myofascial release   Manual therapy comments Manual therapy completed prior to exercises.   Myofascial Release Myofascial release and manual stretching completed to left upper arm, trapezius, and scapularis region to decrease fascial restrictions and increase joint mobility in a pain free zone.                 OT Education - 01/15/16 1737    Education provided Yes   Education Details Patient was given print out of OT evaluation and reviewed plan of care and goals.    Person(s) Educated Patient   Methods Explanation;Handout   Comprehension Verbalized understanding          OT Short Term Goals - 01/15/16 1745    OT SHORT TERM GOAL #1   Title Pt will be educated on and independent in HEP.    Time 4   Period Weeks   Status On-going   OT SHORT TERM GOAL #2   Title Pt will return to prior level of functioning and independence during daily tasks.    Time 4   Period Weeks   Status On-going   OT SHORT TERM GOAL #3   Title Pt will decrease fascial restrictions in LUE from max to moderate amounts or less to increase LUE mobility.    Time 4   Period Weeks   Status On-going   OT SHORT TERM GOAL #4   Title Pt will decrease pain to 3/10 or less to increase ability to perform daily tasks.    Time 4   Period Weeks   Status On-going   OT SHORT TERM GOAL #5   Title Pt will increase LUE A/ROM to [facility] to increase ability to donn shirts.    Time 4   Period Weeks   Status On-going   OT SHORT TERM GOAL #6   Title Pt will increase LUE strength to 4/5 to increase ability to carry a gallon of tea.    Time 4   Period  Weeks   Status On-going                  Plan - 01/15/16 1743    Clinical Impression Statement A: Initiated myofascial release and manual stretching this session. Pt presents today with increased  pain, muscle tightness and trigger points in left UE. Focused on pain management this session with ES and moist heat. Pt reports a pain level of 4/10 when session was finished.    Plan P: Follow up on pain level and management at home. Attempt isometrics and therapy ball stretches.         Problem List Patient Active Problem List   Diagnosis Date Noted  . OSA on CPAP 04/01/2015  . Subacute confusional state 04/01/2015  . Hallucinations, visual 04/01/2015  . Hypnapompic hallucinations 04/01/2015  . Sleep apnea with use of continuous positive airway pressure (CPAP)   . Hypersomnia, persistent 03/30/2013  . Obstructive sleep apnea (adult) (pediatric) 03/30/2013  . CONSTIPATION 11/18/2008  . RECTAL BLEEDING 11/18/2008  . HEMORRHOIDS, INTERNAL 11/15/2008  . DIVERTICULOSIS, COLON 11/15/2008  . CELLULITIS/ABSCESS, TRUNK 02/22/2008  . LOW BACK PAIN 02/22/2008  . DIZZINESS 02/22/2008  . FATIGUE 02/22/2008  . PALPITATIONS 02/22/2008  . MURMUR, CARDIAC, UNDIAGNOSED 02/22/2008  . BREAST CANCER, HX OF 02/22/2008  . COUGH 01/05/2008  . DEPRESSION 07/05/2007  . UTERINE POLYP 07/05/2007  . FIBROMYALGIA 07/05/2007  . COLONIC POLYPS, HX OF 07/05/2007  . HX, PERSONAL, CERVICAL DYSPLASIA 07/05/2007    Ailene Ravel, OTR/L,CBIS  [phone]  01/15/2016, 5:46 PM  [facility] [address] [place], [place], [address] Phone: [phone]   Fax:  [phone]  Name: [name] MRN: [id number] Date of Birth: 18-Sep-1961

## 2016-01-19 ENCOUNTER — Ambulatory Visit (HOSPITAL_COMMUNITY): Payer: BLUE CROSS/BLUE SHIELD

## 2016-01-19 DIAGNOSIS — M629 Disorder of muscle, unspecified: Secondary | ICD-10-CM

## 2016-01-19 DIAGNOSIS — M25512 Pain in left shoulder: Secondary | ICD-10-CM

## 2016-01-19 DIAGNOSIS — M6281 Muscle weakness (generalized): Secondary | ICD-10-CM

## 2016-01-19 DIAGNOSIS — M6289 Other specified disorders of muscle: Secondary | ICD-10-CM

## 2016-01-19 DIAGNOSIS — M25612 Stiffness of left shoulder, not elsewhere classified: Secondary | ICD-10-CM

## 2016-01-19 NOTE — Therapy (Addendum)
[place], [place], [address] Phone: [phone]   Fax:  [phone]  Occupational Therapy Treatment  Patient Details  Name: [name] MRN: [id number] Date of Birth: 07-22-1961 Referring Provider: Dr. Jean Rosenthal  Encounter Date: 01/19/2016      OT End of Session - 01/19/16 1658    Visit Number 3   Number of Visits 8   Date for OT Re-Evaluation 02/07/16   Authorization Type BCBS   Authorization Time Period based on medical necessity   OT Start Time 1519   OT Stop Time 1610   OT Time Calculation (min) 51 min   Activity Tolerance Patient tolerated treatment well   Behavior During Therapy [facility] for tasks assessed/performed      Past Medical History  Diagnosis Date  . Anxiety   . Depression, major ([place])     suicidal thoughts -implants, mental health, 05/11  . Fibromyalgia   . Breast reconstruction deformity   . Hypersomnia     sleep attacks  . Hypothyroidism   . Headache(784.0)   . Migraine headache   . Neoplasm     malignant breast, history of status post bilateral  . Breast cancer ([place])     rt breast  and left breast breast removed  . Sleep apnea with use of continuous positive airway pressure (CPAP)     AHI 20 in HST, titration to 6 cm water - residual AHi 6 . remaining hypersomnic.   Marland Kitchen Complication of anesthesia     shaking-after mastectomies-resp and hr dropped  . Post-operative nausea and vomiting   . Sleep apnea     CPAP  . Narcolepsy     Past Surgical History  Procedure Laterality Date  . Hysterestomy  2005  . Carpal tunnel release  2011    both hands  . Wisdom teeth out    . Btl    . Breast reconstructioin  7/09    flaps-bilat mastectomies  . Colonoscopy w/ polypectomy  4/03    tubulovillous adenoma  . Post- a -cath  2008  . Mastectomy Bilateral 2008  . Ganglion cyst excision      both [phone]  . Finger arthroscopy with carpometacarpel (cmc) arthroplasty Right  06/20/2014    Procedure: ARTHROSCOPY RIGHT THUMB CARPOMETACARPEL GRAFT JACKET INTERPOSITION PARTIAL TRAPEZIECTOMY POSSIBLE OPEN;  Surgeon: Daryll Brod, MD;  Location: [place];  Service: Orthopedics;  Laterality: Right;  . Carpometacarpel suspension plasty Right 08/29/2014    Procedure: REMOVAL DERMASPAN SUSPENSIONPLASTY RIGHT THUMB ABDUCTOR POLLIS TRANSFER;  Surgeon: Daryll Brod, MD;  Location: [place];  Service: Orthopedics;  Laterality: Right;  . Trigger finger release Right 08/29/2014    Procedure: RELEASE TRIGGER FINGER/A-1 PULLEY;  Surgeon: Daryll Brod, MD;  Location: [place];  Service: Orthopedics;  Laterality: Right;    There were no vitals filed for this visit.  Visit Diagnosis:  Left shoulder pain  Stiffness of left shoulder joint  Tight fascia  Muscle weakness of left upper extremity    S: My arm still hurts a lot.  Pain: 7/10 aching, constant, left shoulder                OT Treatments/Exercises (OP) - 01/19/16 1656    Exercises   Exercises Shoulder   Shoulder Exercises: Supine   Protraction PROM;10 reps   Horizontal ABduction PROM;10 reps   External Rotation PROM;10 reps   Internal Rotation PROM;10 reps   Flexion PROM;10 reps  ABduction PROM;10 reps   Shoulder Exercises: Seated   Elevation AROM;10 reps   Extension AROM;10 reps   Retraction AROM;10 reps   Shoulder Exercises: Isometric Strengthening   Flexion Supine;3X3"   Extension Supine;3X3"   External Rotation Supine;3X3"   Internal Rotation Supine;3X3"   ABduction Supine;3X3"   ADduction Supine;3X3"   Modalities   Modalities Moist Heat;Electrical Stimulation   Moist Heat Therapy   Number Minutes Moist Heat 10 Minutes   Moist Heat Location Shoulder   Electrical Stimulation   Electrical Stimulation Location Left shoulder   Electrical Stimulation Action interferential   Electrical Stimulation Parameters 9.0 CV   Electrical Stimulation Goals  Pain   Manual Therapy   Manual Therapy Myofascial release   Manual therapy comments Manual therapy completed prior to exercises.   Myofascial Release Myofascial release and manual stretching completed to left upper arm, trapezius, and scapularis region to decrease fascial restrictions and increase joint mobility in a pain free zone.                   OT Short Term Goals - 01/15/16 1745    OT SHORT TERM GOAL #1   Title Pt will be educated on and independent in HEP.    Time 4   Period Weeks   Status On-going   OT SHORT TERM GOAL #2   Title Pt will return to prior level of functioning and independence during daily tasks.    Time 4   Period Weeks   Status On-going   OT SHORT TERM GOAL #3   Title Pt will decrease fascial restrictions in LUE from max to moderate amounts or less to increase LUE mobility.    Time 4   Period Weeks   Status On-going   OT SHORT TERM GOAL #4   Title Pt will decrease pain to 3/10 or less to increase ability to perform daily tasks.    Time 4   Period Weeks   Status On-going   OT SHORT TERM GOAL #5   Title Pt will increase LUE A/ROM to [facility] to increase ability to donn shirts.    Time 4   Period Weeks   Status On-going   OT SHORT TERM GOAL #6   Title Pt will increase LUE strength to 4/5 to increase ability to carry a gallon of tea.    Time 4   Period Weeks   Status On-going                  Plan - 01/19/16 1659    Clinical Impression Statement A: Patient continues to have increased pain and tenderness with manual therapy and passive stretching. ES and moist heat used at then end of session for pain management. Added isometrics and therapy ball stretches. VC for form and technique. Needed verbal reminders to relax shoulder during stretches and exercises.    Plan P: Continue to work on decreasing pain level to allow for an increase in ROM needed for functional reaching tasks.         Problem List Patient Active Problem List    Diagnosis Date Noted  . OSA on CPAP 04/01/2015  . Subacute confusional state 04/01/2015  . Hallucinations, visual 04/01/2015  . Hypnapompic hallucinations 04/01/2015  . Sleep apnea with use of continuous positive airway pressure (CPAP)   . Hypersomnia, persistent 03/30/2013  . Obstructive sleep apnea (adult) (pediatric) 03/30/2013  . CONSTIPATION 11/18/2008  . RECTAL BLEEDING 11/18/2008  . HEMORRHOIDS, INTERNAL 11/15/2008  . DIVERTICULOSIS, COLON  11/15/2008  . CELLULITIS/ABSCESS, TRUNK 02/22/2008  . LOW BACK PAIN 02/22/2008  . DIZZINESS 02/22/2008  . FATIGUE 02/22/2008  . PALPITATIONS 02/22/2008  . MURMUR, CARDIAC, UNDIAGNOSED 02/22/2008  . BREAST CANCER, HX OF 02/22/2008  . COUGH 01/05/2008  . DEPRESSION 07/05/2007  . UTERINE POLYP 07/05/2007  . FIBROMYALGIA 07/05/2007  . COLONIC POLYPS, HX OF 07/05/2007  . HX, PERSONAL, CERVICAL DYSPLASIA 07/05/2007    Ailene Ravel, OTR/L,CBIS  [phone]  01/19/2016, 5:01 PM  [facility] [address] [place], [place], [address] Phone: [phone]   Fax:  [phone]  Name: ABIELA ROLLINGER MRN: [id number] Date of Birth: [birth year]

## 2016-01-22 ENCOUNTER — Encounter (HOSPITAL_COMMUNITY): Payer: Self-pay

## 2016-01-22 ENCOUNTER — Ambulatory Visit (HOSPITAL_COMMUNITY): Payer: BLUE CROSS/BLUE SHIELD

## 2016-01-22 DIAGNOSIS — M6281 Muscle weakness (generalized): Secondary | ICD-10-CM

## 2016-01-22 DIAGNOSIS — M6289 Other specified disorders of muscle: Secondary | ICD-10-CM

## 2016-01-22 DIAGNOSIS — M25512 Pain in left shoulder: Secondary | ICD-10-CM

## 2016-01-22 DIAGNOSIS — M629 Disorder of muscle, unspecified: Secondary | ICD-10-CM

## 2016-01-22 DIAGNOSIS — M25612 Stiffness of left shoulder, not elsewhere classified: Secondary | ICD-10-CM

## 2016-01-22 NOTE — Therapy (Signed)
[place], [place], [address] Phone: [phone]   Fax:  [phone]  Occupational Therapy Treatment  Patient Details  Name: [name] MRN: [id number] Date of Birth: Dec 23, 1960 Referring Provider: Dr. Jean Rosenthal  Encounter Date: 01/22/2016      OT End of Session - 01/22/16 1723    Visit Number 4   Number of Visits 8   Date for OT Re-Evaluation 02/07/16   Authorization Type BCBS   Authorization Time Period based on medical necessity   OT Start Time 1445  Patient arrived late for session   OT Stop Time 1527   OT Time Calculation (min) 42 min   Activity Tolerance Patient tolerated treatment well;Patient limited by pain   Behavior During Therapy [facility] for tasks assessed/performed      Past Medical History  Diagnosis Date  . Anxiety   . Depression, major ([place])     suicidal thoughts -implants, mental health, 05/11  . Fibromyalgia   . Breast reconstruction deformity   . Hypersomnia     sleep attacks  . Hypothyroidism   . Headache(784.0)   . Migraine headache   . Neoplasm     malignant breast, history of status post bilateral  . Breast cancer ([place])     rt breast  and left breast breast removed  . Sleep apnea with use of continuous positive airway pressure (CPAP)     AHI 20 in HST, titration to 6 cm water - residual AHi 6 . remaining hypersomnic.   Marland Kitchen Complication of anesthesia     shaking-after mastectomies-resp and hr dropped  . Post-operative nausea and vomiting   . Sleep apnea     CPAP  . Narcolepsy     Past Surgical History  Procedure Laterality Date  . Hysterestomy  2005  . Carpal tunnel release  2011    both hands  . Wisdom teeth out    . Btl    . Breast reconstructioin  7/09    flaps-bilat mastectomies  . Colonoscopy w/ polypectomy  4/03    tubulovillous adenoma  . Post- a -cath  2008  . Mastectomy Bilateral 2008  . Ganglion cyst excision      both [phone]  . Finger  arthroscopy with carpometacarpel (cmc) arthroplasty Right 06/20/2014    Procedure: ARTHROSCOPY RIGHT THUMB CARPOMETACARPEL GRAFT JACKET INTERPOSITION PARTIAL TRAPEZIECTOMY POSSIBLE OPEN;  Surgeon: Daryll Brod, MD;  Location: [place];  Service: Orthopedics;  Laterality: Right;  . Carpometacarpel suspension plasty Right 08/29/2014    Procedure: REMOVAL DERMASPAN SUSPENSIONPLASTY RIGHT THUMB ABDUCTOR POLLIS TRANSFER;  Surgeon: Daryll Brod, MD;  Location: [place];  Service: Orthopedics;  Laterality: Right;  . Trigger finger release Right 08/29/2014    Procedure: RELEASE TRIGGER FINGER/A-1 PULLEY;  Surgeon: Daryll Brod, MD;  Location: [place];  Service: Orthopedics;  Laterality: Right;    There were no vitals filed for this visit.  Visit Diagnosis:  Left shoulder pain  Stiffness of left shoulder joint  Tight fascia  Muscle weakness of left upper extremity      Subjective Assessment - 01/22/16 1721    Subjective  S: My arm is killing me today. It might have been because I watched my 69 month old granddaughter the other morning. I tried not to pick her up and hold her if I could help it.    Currently in Pain? Yes   Pain Score 7    Pain Location Shoulder  Pain Orientation Left   Pain Descriptors / Indicators Aching   Pain Type Acute pain   Pain Radiating Towards Neck down to shoulder   Pain Onset More than a month ago   Pain Frequency Constant   Aggravating Factors  Movement and reaching   Pain Relieving Factors pain medications and rest   Effect of Pain on Daily Activities Unable to wash hair, dry hair, get dressed.   Multiple Pain Sites No            [facility] OT Assessment - 01/22/16 1723    Assessment   Diagnosis left shoulder pain   Precautions   Precautions None                  OT Treatments/Exercises (OP) - 01/22/16 1723    Exercises   Exercises Shoulder   Shoulder Exercises: Supine   Protraction PROM;10 reps    Horizontal ABduction PROM;10 reps   External Rotation PROM;10 reps   Internal Rotation PROM;10 reps   Flexion PROM;10 reps   ABduction PROM;10 reps   Shoulder Exercises: Therapy Ball   Flexion 10 reps   ABduction 10 reps   Modalities   Modalities Moist Heat;Electrical Stimulation   Moist Heat Therapy   Number Minutes Moist Heat 10 Minutes   Moist Heat Location Shoulder   Electrical Stimulation   Electrical Stimulation Location Left shoulder   Electrical Stimulation Action interferential   Electrical Stimulation Parameters 9.8 CV   Electrical Stimulation Goals Pain   Manual Therapy   Manual Therapy Myofascial release   Manual therapy comments Manual therapy completed prior to exercises.   Myofascial Release Myofascial release and manual stretching completed to left upper arm, trapezius, and scapularis region to decrease fascial restrictions and increase joint mobility in a pain free zone.                   OT Short Term Goals - 01/15/16 1745    OT SHORT TERM GOAL #1   Title Pt will be educated on and independent in HEP.    Time 4   Period Weeks   Status On-going   OT SHORT TERM GOAL #2   Title Pt will return to prior level of functioning and independence during daily tasks.    Time 4   Period Weeks   Status On-going   OT SHORT TERM GOAL #3   Title Pt will decrease fascial restrictions in LUE from max to moderate amounts or less to increase LUE mobility.    Time 4   Period Weeks   Status On-going   OT SHORT TERM GOAL #4   Title Pt will decrease pain to 3/10 or less to increase ability to perform daily tasks.    Time 4   Period Weeks   Status On-going   OT SHORT TERM GOAL #5   Title Pt will increase LUE A/ROM to [facility] to increase ability to donn shirts.    Time 4   Period Weeks   Status On-going   OT SHORT TERM GOAL #6   Title Pt will increase LUE strength to 4/5 to increase ability to carry a gallon of tea.    Time 4   Period Weeks   Status On-going                   Plan - 01/22/16 1724    Clinical Impression Statement A: Pt arrived late for session. Pt reports that she was taking care of her 5  month old grandchild and she tried not to lift her unless she had to. Patient does report that during bottle feeding she did notice some pulling in her shoulder. Patient states that she's changed her follow up appointment to April with her MD so it'll be exactly 4 weeks since start of therapy.   Plan P: Continue to work on decreasing pain level to allow for an increase in ROM and for functional reaching tasks. Attempt PVC pipe slide.         Problem List Patient Active Problem List   Diagnosis Date Noted  . OSA on CPAP 04/01/2015  . Subacute confusional state 04/01/2015  . Hallucinations, visual 04/01/2015  . Hypnapompic hallucinations 04/01/2015  . Sleep apnea with use of continuous positive airway pressure (CPAP)   . Hypersomnia, persistent 03/30/2013  . Obstructive sleep apnea (adult) (pediatric) 03/30/2013  . CONSTIPATION 11/18/2008  . RECTAL BLEEDING 11/18/2008  . HEMORRHOIDS, INTERNAL 11/15/2008  . DIVERTICULOSIS, COLON 11/15/2008  . CELLULITIS/ABSCESS, TRUNK 02/22/2008  . LOW BACK PAIN 02/22/2008  . DIZZINESS 02/22/2008  . FATIGUE 02/22/2008  . PALPITATIONS 02/22/2008  . MURMUR, CARDIAC, UNDIAGNOSED 02/22/2008  . BREAST CANCER, HX OF 02/22/2008  . COUGH 01/05/2008  . DEPRESSION 07/05/2007  . UTERINE POLYP 07/05/2007  . FIBROMYALGIA 07/05/2007  . COLONIC POLYPS, HX OF 07/05/2007  . HX, PERSONAL, CERVICAL DYSPLASIA 07/05/2007    Ailene Ravel, OTR/L,CBIS  [phone]  01/22/2016, 5:28 PM  [facility] [address] [place], [place], [address] Phone: [phone]   Fax:  [phone]  Name: [name] MRN: [id number] Date of Birth: 10/05/1961

## 2016-01-27 ENCOUNTER — Encounter (HOSPITAL_COMMUNITY): Payer: Self-pay | Admitting: Occupational Therapy

## 2016-01-27 ENCOUNTER — Ambulatory Visit (HOSPITAL_COMMUNITY): Payer: BLUE CROSS/BLUE SHIELD | Admitting: Occupational Therapy

## 2016-01-27 DIAGNOSIS — M25512 Pain in left shoulder: Secondary | ICD-10-CM

## 2016-01-27 DIAGNOSIS — M25612 Stiffness of left shoulder, not elsewhere classified: Secondary | ICD-10-CM

## 2016-01-27 DIAGNOSIS — M6281 Muscle weakness (generalized): Secondary | ICD-10-CM

## 2016-01-27 DIAGNOSIS — M6289 Other specified disorders of muscle: Secondary | ICD-10-CM

## 2016-01-27 DIAGNOSIS — M629 Disorder of muscle, unspecified: Secondary | ICD-10-CM

## 2016-01-27 NOTE — Therapy (Signed)
[place], [place], [address] Phone: [phone]   Fax:  [phone]  Occupational Therapy Treatment  Patient Details  Name: [name] MRN: [id number] Date of Birth: 02-15-1961 Referring Provider: Dr. Jean Rosenthal  Encounter Date: 01/27/2016      OT End of Session - 01/27/16 1609    Visit Number 5   Number of Visits 8   Date for OT Re-Evaluation 02/07/16   Authorization Type BCBS   Authorization Time Period based on medical necessity   OT Start Time 1525  pt arrived late   OT Stop Time 1613   OT Time Calculation (min) 48 min   Activity Tolerance Patient tolerated treatment well;Patient limited by pain   Behavior During Therapy [facility] for tasks assessed/performed      Past Medical History  Diagnosis Date  . Anxiety   . Depression, major ([place])     suicidal thoughts -implants, mental health, 05/11  . Fibromyalgia   . Breast reconstruction deformity   . Hypersomnia     sleep attacks  . Hypothyroidism   . Headache(784.0)   . Migraine headache   . Neoplasm     malignant breast, history of status post bilateral  . Breast cancer ([place])     rt breast  and left breast breast removed  . Sleep apnea with use of continuous positive airway pressure (CPAP)     AHI 20 in HST, titration to 6 cm water - residual AHi 6 . remaining hypersomnic.   Marland Kitchen Complication of anesthesia     shaking-after mastectomies-resp and hr dropped  . Post-operative nausea and vomiting   . Sleep apnea     CPAP  . Narcolepsy     Past Surgical History  Procedure Laterality Date  . Hysterestomy  2005  . Carpal tunnel release  2011    both hands  . Wisdom teeth out    . Btl    . Breast reconstructioin  7/09    flaps-bilat mastectomies  . Colonoscopy w/ polypectomy  4/03    tubulovillous adenoma  . Post- a -cath  2008  . Mastectomy Bilateral 2008  . Ganglion cyst excision      both [phone]  . Finger arthroscopy with  carpometacarpel (cmc) arthroplasty Right 06/20/2014    Procedure: ARTHROSCOPY RIGHT THUMB CARPOMETACARPEL GRAFT JACKET INTERPOSITION PARTIAL TRAPEZIECTOMY POSSIBLE OPEN;  Surgeon: Daryll Brod, MD;  Location: [place];  Service: Orthopedics;  Laterality: Right;  . Carpometacarpel suspension plasty Right 08/29/2014    Procedure: REMOVAL DERMASPAN SUSPENSIONPLASTY RIGHT THUMB ABDUCTOR POLLIS TRANSFER;  Surgeon: Daryll Brod, MD;  Location: [place];  Service: Orthopedics;  Laterality: Right;  . Trigger finger release Right 08/29/2014    Procedure: RELEASE TRIGGER FINGER/A-1 PULLEY;  Surgeon: Daryll Brod, MD;  Location: [place];  Service: Orthopedics;  Laterality: Right;    There were no vitals filed for this visit.  Visit Diagnosis:  Left shoulder pain  Stiffness of left shoulder joint  Tight fascia  Muscle weakness of left upper extremity      Subjective Assessment - 01/27/16 1528    Subjective  S: My arm was hurting really badly when I woke up today.    Currently in Pain? Yes   Pain Score 6    Pain Location Shoulder   Pain Orientation Left   Pain Descriptors / Indicators Aching   Pain Type Acute pain   Pain Radiating Towards neck down to the  shoulder   Pain Onset More than a month ago   Pain Frequency Constant   Aggravating Factors  movement and reaching   Pain Relieving Factors pain medications and rest    Effect of Pain on Daily Activities unable to use for certain daily tasks.             [facility] OT Assessment - 01/27/16 1613    Assessment   Diagnosis left shoulder pain   Precautions   Precautions None                  OT Treatments/Exercises (OP) - 01/27/16 1530    Exercises   Exercises Shoulder   Shoulder Exercises: Supine   Protraction PROM;10 reps   Horizontal ABduction PROM;10 reps   External Rotation PROM;10 reps   Internal Rotation PROM;10 reps   Flexion PROM;10 reps   ABduction PROM;10 reps    Shoulder Exercises: Seated   Elevation AROM;5 reps   Extension AROM;5 reps   Retraction AROM;5 reps   Shoulder Exercises: Standing   Other Standing Exercises Pt completed PVC sliding exercise in flexion, 5 reps completed. Pt able to achieve approximately 40-50% range of motion   Modalities   Modalities Moist Heat;Electrical Stimulation   Moist Heat Therapy   Number Minutes Moist Heat 10 Minutes   Moist Heat Location Shoulder   Electrical Stimulation   Electrical Stimulation Location Left shoulder   Electrical Stimulation Action interferential   Electrical Stimulation Parameters 10.0 CV   Electrical Stimulation Goals Pain   Manual Therapy   Manual Therapy Myofascial release   Manual therapy comments Manual therapy completed prior to exercises.   Myofascial Release Myofascial release and manual stretching completed to left upper arm, trapezius, and scapularis region to decrease fascial restrictions and increase joint mobility in a pain free zone.                   OT Short Term Goals - 01/15/16 1745    OT SHORT TERM GOAL #1   Title Pt will be educated on and independent in HEP.    Time 4   Period Weeks   Status On-going   OT SHORT TERM GOAL #2   Title Pt will return to prior level of functioning and independence during daily tasks.    Time 4   Period Weeks   Status On-going   OT SHORT TERM GOAL #3   Title Pt will decrease fascial restrictions in LUE from max to moderate amounts or less to increase LUE mobility.    Time 4   Period Weeks   Status On-going   OT SHORT TERM GOAL #4   Title Pt will decrease pain to 3/10 or less to increase ability to perform daily tasks.    Time 4   Period Weeks   Status On-going   OT SHORT TERM GOAL #5   Title Pt will increase LUE A/ROM to [facility] to increase ability to donn shirts.    Time 4   Period Weeks   Status On-going   OT SHORT TERM GOAL #6   Title Pt will increase LUE strength to 4/5 to increase ability to carry a gallon of  tea.    Time 4   Period Weeks   Status On-going                  Plan - 01/27/16 1610    Clinical Impression Statement A: Pt arrived late to session. Pt reports shoulder feels better than  previous session, however continues to be very painful. Pt did take pain medication upon getting up this morning. Added PVC pipe slide, pt unable to tolerate more than 5 repetitions due to fatigue and muscle spasms. Pt continues to have involuntary muscle spasms during passive stretching, therefore unable to tolerate P/ROM beyond 50% in flexion, 25% in abduction/ER.  Pt reports pain at 5/10 at end of session after E-STIM and moist heat applied.    Plan P: Continue to work on decreasing pain level to allow for greater ROM and ability to use LUE during functional tasks. Resume therapy ball exercises.         Problem List Patient Active Problem List   Diagnosis Date Noted  . OSA on CPAP 04/01/2015  . Subacute confusional state 04/01/2015  . Hallucinations, visual 04/01/2015  . Hypnapompic hallucinations 04/01/2015  . Sleep apnea with use of continuous positive airway pressure (CPAP)   . Hypersomnia, persistent 03/30/2013  . Obstructive sleep apnea (adult) (pediatric) 03/30/2013  . CONSTIPATION 11/18/2008  . RECTAL BLEEDING 11/18/2008  . HEMORRHOIDS, INTERNAL 11/15/2008  . DIVERTICULOSIS, COLON 11/15/2008  . CELLULITIS/ABSCESS, TRUNK 02/22/2008  . LOW BACK PAIN 02/22/2008  . DIZZINESS 02/22/2008  . FATIGUE 02/22/2008  . PALPITATIONS 02/22/2008  . MURMUR, CARDIAC, UNDIAGNOSED 02/22/2008  . BREAST CANCER, HX OF 02/22/2008  . COUGH 01/05/2008  . DEPRESSION 07/05/2007  . UTERINE POLYP 07/05/2007  . FIBROMYALGIA 07/05/2007  . COLONIC POLYPS, HX OF 07/05/2007  . HX, PERSONAL, CERVICAL DYSPLASIA 07/05/2007    Guadelupe Sabin, OTR/L  [phone]  01/27/2016, 4:24 PM  [facility] [address] [place], [place], [address] Phone: [phone]    Fax:  [phone]  Name: [name] MRN: [id number] Date of Birth: 21-Jan-1961

## 2016-01-30 ENCOUNTER — Ambulatory Visit (HOSPITAL_COMMUNITY): Payer: BLUE CROSS/BLUE SHIELD | Admitting: Occupational Therapy

## 2016-01-30 ENCOUNTER — Telehealth (HOSPITAL_COMMUNITY): Payer: Self-pay | Admitting: Occupational Therapy

## 2016-01-30 NOTE — Telephone Encounter (Signed)
She decided that she did not want to come in today

## 2016-02-03 ENCOUNTER — Encounter (HOSPITAL_COMMUNITY): Payer: Self-pay

## 2016-02-03 ENCOUNTER — Ambulatory Visit (HOSPITAL_COMMUNITY): Payer: BLUE CROSS/BLUE SHIELD

## 2016-02-03 DIAGNOSIS — M25612 Stiffness of left shoulder, not elsewhere classified: Secondary | ICD-10-CM

## 2016-02-03 DIAGNOSIS — M6289 Other specified disorders of muscle: Secondary | ICD-10-CM

## 2016-02-03 DIAGNOSIS — M25512 Pain in left shoulder: Secondary | ICD-10-CM

## 2016-02-03 DIAGNOSIS — M6281 Muscle weakness (generalized): Secondary | ICD-10-CM

## 2016-02-03 DIAGNOSIS — M629 Disorder of muscle, unspecified: Secondary | ICD-10-CM

## 2016-02-03 NOTE — Therapy (Addendum)
[facility] [address] , [place], [address] Phone: [phone]   Fax:  [phone]  Occupational Therapy Treatment  Patient Details  Name: [name] MRN: [id number] Date of Birth: 04/28/1961 Referring Provider: Dr. Christopher Blackman  Encounter Date: 02/03/2016      OT End of Session - 02/03/16 1656    Visit Number 6   Number of Visits 8   Date for OT Re-Evaluation 02/07/16   Authorization Type BCBS   Authorization Time Period based on medical necessity   OT Start Time 1515   OT Stop Time 1605   OT Time Calculation (min) 50 min   Activity Tolerance Patient tolerated treatment well;Patient limited by pain   Behavior During Therapy [facility] for tasks assessed/performed      Past Medical History  Diagnosis Date  . Anxiety   . Depression, major ([facility])     suicidal thoughts -implants, mental health, 05/11  . Fibromyalgia   . Breast reconstruction deformity   . Hypersomnia     sleep attacks  . Hypothyroidism   . Headache(784.0)   . Migraine headache   . Neoplasm     malignant breast, history of status post bilateral  . Breast cancer ([facility])     rt breast  and left breast breast removed  . Sleep apnea with use of continuous positive airway pressure (CPAP)     AHI 20 in HST, titration to 6 cm water - residual AHi 6 . remaining hypersomnic.   . Complication of anesthesia     shaking-after mastectomies-resp and hr dropped  . Post-operative nausea and vomiting   . Sleep apnea     CPAP  . Narcolepsy     Past Surgical History  Procedure Laterality Date  . Hysterestomy  2005  . Carpal tunnel release  2011    both hands  . Wisdom teeth out    . Btl    . Breast reconstructioin  7/09    flaps-bilat mastectomies  . Colonoscopy w/ polypectomy  4/03    tubulovillous adenoma  . Post- a -cath  2008  . Mastectomy Bilateral 2008  . Ganglion cyst excision      both wrist1975  . Finger arthroscopy with carpometacarpel  (cmc) arthroplasty Right 06/20/2014    Procedure: ARTHROSCOPY RIGHT THUMB CARPOMETACARPEL GRAFT JACKET INTERPOSITION PARTIAL TRAPEZIECTOMY POSSIBLE OPEN;  Surgeon: Gary Kuzma, MD;  Location: [facility];  Service: Orthopedics;  Laterality: Right;  . Carpometacarpel suspension plasty Right 08/29/2014    Procedure: REMOVAL DERMASPAN SUSPENSIONPLASTY RIGHT THUMB ABDUCTOR POLLIS TRANSFER;  Surgeon: Gary Kuzma, MD;  Location: [facility];  Service: Orthopedics;  Laterality: Right;  . Trigger finger release Right 08/29/2014    Procedure: RELEASE TRIGGER FINGER/A-1 PULLEY;  Surgeon: Gary Kuzma, MD;  Location: [facility];  Service: Orthopedics;  Laterality: Right;    There were no vitals filed for this visit.  Visit Diagnosis:  Left shoulder pain  Stiffness of left shoulder joint  Muscle weakness of left upper extremity  Tight fascia      Subjective Assessment - 02/03/16 1520    Subjective  My pain is really bad today.   Currently in Pain? Yes   Pain Score 7    Pain Location Shoulder   Pain Orientation Left   Pain Descriptors / Indicators Aching   Pain Type Acute pain   Pain Onset More than a month ago   Pain Frequency Constant                        OT Treatments/Exercises (OP) - 02/03/16 1523    Exercises   Exercises Shoulder   Shoulder Exercises: Supine   Protraction PROM;10 reps;AAROM;5 reps   Horizontal ABduction PROM;10 reps;AROM;5 reps   External Rotation PROM;AAROM;10 reps   Internal Rotation PROM;AAROM;10 reps   Flexion PROM;10 reps;AAROM;5 reps   ABduction PROM;10 reps   Shoulder Exercises: Therapy Ball   Flexion 10 reps   ABduction 10 reps   Modalities   Modalities Moist Heat;Electrical Stimulation   Moist Heat Therapy   Number Minutes Moist Heat 10 Minutes   Moist Heat Location Shoulder   Electrical Stimulation   Electrical Stimulation Location Left shoulder   Electrical Stimulation Action interferential    Electrical Stimulation Parameters 10.0 CV   Electrical Stimulation Goals Pain   Manual Therapy   Manual Therapy Myofascial release   Manual therapy comments Manual therapy completed prior to exercises.   Myofascial Release Myofascial release and manual stretching completed to left upper arm, trapezius, and scapularis region to decrease fascial restrictions and increase joint mobility in a pain free zone.                   OT Short Term Goals - 01/15/16 1745    OT SHORT TERM GOAL #1   Title Pt will be educated on and independent in HEP.    Time 4   Period Weeks   Status On-going   OT SHORT TERM GOAL #2   Title Pt will return to prior level of functioning and independence during daily tasks.    Time 4   Period Weeks   Status On-going   OT SHORT TERM GOAL #3   Title Pt will decrease fascial restrictions in LUE from max to moderate amounts or less to increase LUE mobility.    Time 4   Period Weeks   Status On-going   OT SHORT TERM GOAL #4   Title Pt will decrease pain to 3/10 or less to increase ability to perform daily tasks.    Time 4   Period Weeks   Status On-going   OT SHORT TERM GOAL #5   Title Pt will increase LUE A/ROM to [facility] to increase ability to donn shirts.    Time 4   Period Weeks   Status On-going   OT SHORT TERM GOAL #6   Title Pt will increase LUE strength to 4/5 to increase ability to carry a gallon of tea.    Time 4   Period Weeks   Status On-going                  Plan - 02/03/16 1521    Clinical Impression Statement A: Patient did not take pain medicine prior to therapy session today. Added AA/ROM exercises, patient unable to complete more than 5 reps due to pain. Therapist notes increased fascial restrictions and involuntary muscle spasms during passive stretching. Patient reports pain is at 5/10 after E-STIM and moist heat applied.   Plan P: Patient sees the doctor 02/09/16, take measurements next session and send to doctor. Continue to  work on decreasing pain level to allow for greater ROM and ability to use LUE during functional tasks. Introduce AA/ROM seated.        Problem List Patient Active Problem List   Diagnosis Date Noted  . OSA on CPAP 04/01/2015  . Subacute confusional state 04/01/2015  . Hallucinations, visual 04/01/2015  . Hypnapompic hallucinations 04/01/2015  . Sleep apnea with use of continuous positive airway pressure (CPAP)   .  Hypersomnia, persistent 03/30/2013  . Obstructive sleep apnea (adult) (pediatric) 03/30/2013  . CONSTIPATION 11/18/2008  . RECTAL BLEEDING 11/18/2008  . HEMORRHOIDS, INTERNAL 11/15/2008  . DIVERTICULOSIS, COLON 11/15/2008  . CELLULITIS/ABSCESS, TRUNK 02/22/2008  . LOW BACK PAIN 02/22/2008  . DIZZINESS 02/22/2008  . FATIGUE 02/22/2008  . PALPITATIONS 02/22/2008  . MURMUR, CARDIAC, UNDIAGNOSED 02/22/2008  . BREAST CANCER, HX OF 02/22/2008  . COUGH 01/05/2008  . DEPRESSION 07/05/2007  . UTERINE POLYP 07/05/2007  . FIBROMYALGIA 07/05/2007  . COLONIC POLYPS, HX OF 07/05/2007  . HX, PERSONAL, CERVICAL DYSPLASIA 07/05/2007     , OTR/L,CBIS  [phone]  02/03/2016, 4:57 PM   [facility] [address] [place], [place], [address] Phone: [phone]   Fax:  [phone]  Name: [name] MRN: [id number] Date of Birth: 08/23/1961  OCCUPATIONAL THERAPY DISCHARGE SUMMARY  Visits from Start of Care: 6  Current functional level related to goals / functional outcomes: Patient did not complete 8 OT visits planned. Patient discharged on 05/19/16 due to not returning since last visit. Unknown about remaining deficits. Plan: Patient agrees to discharge.  Patient goals were not met. Patient is being discharged due to not returning since the last visit.  ?????        

## 2016-02-06 ENCOUNTER — Ambulatory Visit (HOSPITAL_COMMUNITY): Payer: BLUE CROSS/BLUE SHIELD | Admitting: Occupational Therapy

## 2016-02-06 ENCOUNTER — Telehealth (HOSPITAL_COMMUNITY): Payer: Self-pay | Admitting: Occupational Therapy

## 2016-02-06 NOTE — Telephone Encounter (Signed)
She is in a lot pain and not wanting to come in.

## 2016-02-27 ENCOUNTER — Encounter: Payer: Self-pay | Admitting: Internal Medicine

## 2016-03-03 ENCOUNTER — Telehealth: Payer: Self-pay | Admitting: Neurology

## 2016-03-03 NOTE — Telephone Encounter (Signed)
[facility] [phone] (f) [phone] called with 3rd request for medical records. Michela Pitcher she has faxed twice on 4/24 and 4/25, she called once before. Pt is due for surgery tomorrow and she is concerned surgery will be c/a without the records.

## 2016-03-03 NOTE — Telephone Encounter (Signed)
I faxed records to the surgical center on 03/03/16.

## 2016-03-31 ENCOUNTER — Ambulatory Visit: Payer: BLUE CROSS/BLUE SHIELD | Admitting: Neurology

## 2016-05-19 ENCOUNTER — Ambulatory Visit (HOSPITAL_COMMUNITY): Payer: BLUE CROSS/BLUE SHIELD | Attending: Orthopaedic Surgery | Admitting: Occupational Therapy

## 2016-05-19 ENCOUNTER — Encounter (HOSPITAL_COMMUNITY): Payer: Self-pay

## 2016-05-19 ENCOUNTER — Encounter (HOSPITAL_COMMUNITY): Payer: Self-pay | Admitting: Occupational Therapy

## 2016-05-19 DIAGNOSIS — R29898 Other symptoms and signs involving the musculoskeletal system: Secondary | ICD-10-CM | POA: Insufficient documentation

## 2016-05-19 DIAGNOSIS — M25512 Pain in left shoulder: Secondary | ICD-10-CM | POA: Insufficient documentation

## 2016-05-19 DIAGNOSIS — M25612 Stiffness of left shoulder, not elsewhere classified: Secondary | ICD-10-CM | POA: Insufficient documentation

## 2016-05-19 NOTE — Patient Instructions (Signed)
SHOULDER: Flexion On Table   Place hands on table, elbows straight. Move hips away from body. Press hands down into table. _10-15__ reps per set, _1-2__ sets per day  Abduction (Passive)   With arm out to side, resting on table, lower head toward arm, keeping trunk away from table. . Repeat _10-15___ times. Do _1-2___ sessions per day.  Copyright  VHI. All rights reserved.     Internal Rotation (Assistive)   Seated with elbow bent at right angle and held against side, slide arm on table surface in an inward arc. Repeat __10-15__ times. Do _1-2___ sessions per day. Activity: Use this motion to brush crumbs off the table.  Copyright  VHI. All rights reserved.     1) Seated Row   Sit up straight with elbows by your sides. Pull back with shoulders/elbows, keeping forearms straight, as if pulling back on the reins of a horse. Squeeze shoulder blades together. Repeat _10-15__times, __1-2__sets/day    2) Shoulder Elevation    Sit up straight with arms by your sides. Slowly bring your shoulders up towards your ears. Repeat_10-15__times, _1-2___ sets/day    3) Shoulder Extension    Sit up straight with both arms by your side, draw your arms back behind your waist. Keep your elbows straight. Repeat _10-15___times, __1-2__sets/day.        

## 2016-05-19 NOTE — Therapy (Signed)
[place], [place], [address] Phone: [phone]   Fax:  [phone]  Occupational Therapy Evaluation  Patient Details  Name: [name] MRN: [id number] Date of Birth: 06-19-1961 Referring Provider: Dr. Jean Rosenthal  Encounter Date: 05/19/2016      OT End of Session - 05/19/16 1540    Visit Number 1   Number of Visits 8   Date for OT Re-Evaluation 06/16/16   Authorization Type BCBS   OT Start Time 1350   OT Stop Time 1424   OT Time Calculation (min) 34 min   Activity Tolerance Patient tolerated treatment well   Behavior During Therapy [facility] for tasks assessed/performed      Past Medical History  Diagnosis Date  . Anxiety   . Depression, major ([place])     suicidal thoughts -implants, mental health, 05/11  . Fibromyalgia   . Breast reconstruction deformity   . Hypersomnia     sleep attacks  . Hypothyroidism   . Headache(784.0)   . Migraine headache   . Neoplasm     malignant breast, history of status post bilateral  . Breast cancer ([place])     rt breast  and left breast breast removed  . Sleep apnea with use of continuous positive airway pressure (CPAP)     AHI 20 in HST, titration to 6 cm water - residual AHi 6 . remaining hypersomnic.   Marland Kitchen Complication of anesthesia     shaking-after mastectomies-resp and hr dropped  . Post-operative nausea and vomiting   . Sleep apnea     CPAP  . Narcolepsy     Past Surgical History  Procedure Laterality Date  . Hysterestomy  2005  . Carpal tunnel release  2011    both hands  . Wisdom teeth out    . Btl    . Breast reconstructioin  7/09    flaps-bilat mastectomies  . Colonoscopy w/ polypectomy  4/03    tubulovillous adenoma  . Post- a -cath  2008  . Mastectomy Bilateral 2008  . Ganglion cyst excision      both [phone]  . Finger arthroscopy with carpometacarpel (cmc) arthroplasty Right 06/20/2014    Procedure: ARTHROSCOPY RIGHT THUMB  CARPOMETACARPEL GRAFT JACKET INTERPOSITION PARTIAL TRAPEZIECTOMY POSSIBLE OPEN;  Surgeon: Daryll Brod, MD;  Location: [place];  Service: Orthopedics;  Laterality: Right;  . Carpometacarpel suspension plasty Right 08/29/2014    Procedure: REMOVAL DERMASPAN SUSPENSIONPLASTY RIGHT THUMB ABDUCTOR POLLIS TRANSFER;  Surgeon: Daryll Brod, MD;  Location: [place];  Service: Orthopedics;  Laterality: Right;  . Trigger finger release Right 08/29/2014    Procedure: RELEASE TRIGGER FINGER/A-1 PULLEY;  Surgeon: Daryll Brod, MD;  Location: [place];  Service: Orthopedics;  Laterality: Right;    There were no vitals filed for this visit.      Subjective Assessment - 05/19/16 1436    Subjective  S: I can't really tell any difference since last time I was here.    Pertinent History Pt is a 55 y/o female s/p left shoulder arthroscopy for severe impingement on 04/08/16. Pt has been experiencing pain in the left shoulder since January 2017, was seen at this clinic for shoulder pain in March. Dr. Jean Rosenthal has referred pt to occupational therapy for evaluation and treatment.    Patient Stated Goals To be able to do things with less pain in my left arm.    Currently in Pain? Yes  Pain Score 6    Pain Location Shoulder   Pain Orientation Left   Pain Descriptors / Indicators Aching   Pain Type Acute pain   Pain Radiating Towards neck to upper arm   Pain Onset More than a month ago   Pain Frequency Constant   Aggravating Factors  movement, reaching to right   Pain Relieving Factors pain medications and rest   Effect of Pain on Daily Activities limited ability to use LUE for daily tasks   Multiple Pain Sites No           [facility] OT Assessment - 05/19/16 1349    Assessment   Diagnosis Left shoulder arthroscopy   Referring Provider Dr. Jean Rosenthal   Onset Date 04/08/16   Prior Therapy OT for left shoulder pain in March 2017   Precautions    Precautions None   Restrictions   Weight Bearing Restrictions No   Balance Screen   Has the patient fallen in the past 6 months Yes   How many times? 2   Has the patient had a decrease in activity level because of a fear of falling?  No   Is the patient reluctant to leave their home because of a fear of falling?  No   Home  Environment   Family/patient expects to be discharged to: Private residence   Living Arrangements Spouse/significant other   Lives With Spouse   Prior Function   Level of Independence Independent with basic ADLs   Vocation On disability   Leisure gardening, walking dog   ADL   ADL comments Pt is having difficulty with dressing tasks, especially bra and shirts with ties, bathing tasks-reaching across to right side of body, reaching into cabinets, lifting objects, cooking tasks, anything putting pressure on the arm-opening pill bottles, scooping ice cream, etc.    Written Expression   Dominant Hand Right   Vision - History   Baseline Vision Wears glasses only for reading   Cognition   Overall Cognitive Status Within Functional Limits for tasks assessed   ROM / Strength   AROM / PROM / Strength AROM;PROM;Strength   Palpation   Palpation comment Max fascial restrictions in left upper arm, trapezius, and scapularis regions   AROM   Overall AROM Comments Assessed seated, ER/IR adducted   AROM Assessment Site Shoulder   Right/Left Shoulder Left   Left Shoulder Flexion 144 Degrees   Left Shoulder ABduction 143 Degrees   Left Shoulder Internal Rotation 90 Degrees   Left Shoulder External Rotation 46 Degrees   PROM   Overall PROM Comments Assessed supine, ER/IR adducted   PROM Assessment Site Shoulder   Right/Left Shoulder Left   Left Shoulder Flexion 150 Degrees   Left Shoulder ABduction 85 Degrees   Left Shoulder Internal Rotation 90 Degrees   Left Shoulder External Rotation 48 Degrees   Strength   Overall Strength Comments Assessed seated, ER/IR adducted    Strength Assessment Site Shoulder   Right/Left Shoulder Left   Left Shoulder Flexion 3+/5   Left Shoulder ABduction 3/5   Left Shoulder Internal Rotation 3+/5   Left Shoulder External Rotation 3/5                         OT Education - 05/19/16 1536    Education provided Yes   Education Details Pt educated on scapular A/ROM, table slide HEP   Person(s) Educated Patient   Methods Explanation;Demonstration;Handout   Comprehension Verbalized understanding;Returned demonstration  OT Short Term Goals - 05/19/16 1549    OT SHORT TERM GOAL #1   Title Pt will be educated on and independent in HEP.    Time 4   Period Weeks   Status New   OT SHORT TERM GOAL #2   Title Pt will return to prior level of functioning and independence during daily tasks.    Time 4   Period Weeks   Status New   OT SHORT TERM GOAL #3   Title Pt will decrease fascial restrictions in LUE from max to moderate amounts or less to increase LUE mobility.    Time 4   Period Weeks   Status New   OT SHORT TERM GOAL #4   Title Pt will decrease pain to 3/10 or less in LUE to increase ability to perform daily tasks.    Time 4   Period Weeks   Status New   OT SHORT TERM GOAL #5   Title Pt will increase LUE A/ROM to [facility] to increase ability to perform bathing tasks.    Time 4   Period Weeks   Status New   OT SHORT TERM GOAL #6   Title Pt will increase LUE strength to 4/5 to increase ability to carry a lift and carry weighted items.    Time 4   Period Weeks   Status New                  Plan - 05/19/16 1540    Clinical Impression Statement A: Pt is a 55 y/o female s/p left shoulder arthroscopy on 04/08/16 which she reports has not helped her pain any. She does demonstrates improved A/ROM since previous therapy visits in March 2017. Pt presents with increased pain and fascial restrictions, decreased range of motion and strength in the LUE limiting ability to use LUE during daily  tasks.    Rehab Potential Good   OT Frequency 2x / week   OT Duration 4 weeks   OT Treatment/Interventions Self-care/ADL training;Passive range of motion;Patient/family education;Cryotherapy;Electrical Stimulation;Moist Heat;Therapeutic exercise;Manual Therapy;Therapeutic activities   Plan P: Pt will benefit from skilled OT services to decrease pain and fascial restrictions, increase range of motion, strength, and functional use of the LUE during daily tasks. Treatment plan: Myofascial release, manual therapy, P/ROM, AA/ROM, A/ROM, general LUE strengthening, scapular stability and strengthening, modalities as needed including iontophoresis per MD order.    OT Home Exercise Plan scapular A/ROM and table slides   Consulted and Agree with Plan of Care Patient      Patient will benefit from skilled therapeutic intervention in order to improve the following deficits and impairments:  Decreased strength, Pain, Impaired UE functional use, Decreased activity tolerance, Decreased range of motion, Increased fascial restricitons, Impaired flexibility  Visit Diagnosis: Pain in left shoulder  Stiffness of left shoulder, not elsewhere classified  Other symptoms and signs involving the musculoskeletal system    Problem List Patient Active Problem List   Diagnosis Date Noted  . OSA on CPAP 04/01/2015  . Subacute confusional state 04/01/2015  . Hallucinations, visual 04/01/2015  . Hypnapompic hallucinations 04/01/2015  . Sleep apnea with use of continuous positive airway pressure (CPAP)   . Hypersomnia, persistent 03/30/2013  . Obstructive sleep apnea (adult) (pediatric) 03/30/2013  . CONSTIPATION 11/18/2008  . RECTAL BLEEDING 11/18/2008  . HEMORRHOIDS, INTERNAL 11/15/2008  . DIVERTICULOSIS, COLON 11/15/2008  . CELLULITIS/ABSCESS, TRUNK 02/22/2008  . LOW BACK PAIN 02/22/2008  . DIZZINESS 02/22/2008  . FATIGUE 02/22/2008  .  PALPITATIONS 02/22/2008  . MURMUR, CARDIAC, UNDIAGNOSED 02/22/2008  .  BREAST CANCER, HX OF 02/22/2008  . COUGH 01/05/2008  . DEPRESSION 07/05/2007  . UTERINE POLYP 07/05/2007  . FIBROMYALGIA 07/05/2007  . COLONIC POLYPS, HX OF 07/05/2007  . HX, PERSONAL, CERVICAL DYSPLASIA 07/05/2007    Guadelupe Sabin, OTR/L  [phone]  05/19/2016, 3:52 PM  [place] [address] [place], [place], [address] Phone: [phone]   Fax:  [phone]  Name: [name] MRN: [id number] Date of Birth: 05/20/1961

## 2016-05-21 ENCOUNTER — Encounter (HOSPITAL_COMMUNITY): Payer: Self-pay | Admitting: Occupational Therapy

## 2016-05-21 ENCOUNTER — Ambulatory Visit (HOSPITAL_COMMUNITY): Payer: BLUE CROSS/BLUE SHIELD | Admitting: Occupational Therapy

## 2016-05-21 DIAGNOSIS — M25612 Stiffness of left shoulder, not elsewhere classified: Secondary | ICD-10-CM

## 2016-05-21 DIAGNOSIS — M25512 Pain in left shoulder: Secondary | ICD-10-CM

## 2016-05-21 DIAGNOSIS — R29898 Other symptoms and signs involving the musculoskeletal system: Secondary | ICD-10-CM

## 2016-05-21 NOTE — Therapy (Signed)
[place], [place], [address] Phone: [phone]   Fax:  [phone]  Occupational Therapy Treatment  Patient Details  Name: [name] MRN: [id number] Date of Birth: May 27, 1961 Referring Provider: Dr. Jean Rosenthal  Encounter Date: 05/21/2016      OT End of Session - 05/21/16 1519    Visit Number 2   Number of Visits 8   Date for OT Re-Evaluation 06/16/16   Authorization Type BCBS   OT Start Time [id number]   OT Stop Time 1530   OT Time Calculation (min) 53 min   Activity Tolerance Patient tolerated treatment well   Behavior During Therapy [facility] for tasks assessed/performed      Past Medical History  Diagnosis Date  . Anxiety   . Depression, major ([place])     suicidal thoughts -implants, mental health, 05/11  . Fibromyalgia   . Breast reconstruction deformity   . Hypersomnia     sleep attacks  . Hypothyroidism   . Headache(784.0)   . Migraine headache   . Neoplasm     malignant breast, history of status post bilateral  . Breast cancer ([place])     rt breast  and left breast breast removed  . Sleep apnea with use of continuous positive airway pressure (CPAP)     AHI 20 in HST, titration to 6 cm water - residual AHi 6 . remaining hypersomnic.   Marland Kitchen Complication of anesthesia     shaking-after mastectomies-resp and hr dropped  . Post-operative nausea and vomiting   . Sleep apnea     CPAP  . Narcolepsy     Past Surgical History  Procedure Laterality Date  . Hysterestomy  2005  . Carpal tunnel release  2011    both hands  . Wisdom teeth out    . Btl    . Breast reconstructioin  7/09    flaps-bilat mastectomies  . Colonoscopy w/ polypectomy  4/03    tubulovillous adenoma  . Post- a -cath  2008  . Mastectomy Bilateral 2008  . Ganglion cyst excision      both [phone]  . Finger arthroscopy with carpometacarpel (cmc) arthroplasty Right 06/20/2014    Procedure: ARTHROSCOPY RIGHT THUMB  CARPOMETACARPEL GRAFT JACKET INTERPOSITION PARTIAL TRAPEZIECTOMY POSSIBLE OPEN;  Surgeon: Daryll Brod, MD;  Location: [place];  Service: Orthopedics;  Laterality: Right;  . Carpometacarpel suspension plasty Right 08/29/2014    Procedure: REMOVAL DERMASPAN SUSPENSIONPLASTY RIGHT THUMB ABDUCTOR POLLIS TRANSFER;  Surgeon: Daryll Brod, MD;  Location: [place];  Service: Orthopedics;  Laterality: Right;  . Trigger finger release Right 08/29/2014    Procedure: RELEASE TRIGGER FINGER/A-1 PULLEY;  Surgeon: Daryll Brod, MD;  Location: [place];  Service: Orthopedics;  Laterality: Right;    There were no vitals filed for this visit.      Subjective Assessment - 05/21/16 1439    Subjective  S: I've been out in my garden today.    Currently in Pain? Yes   Pain Score 5    Pain Location Shoulder   Pain Orientation Left   Pain Descriptors / Indicators Aching   Pain Type Acute pain   Pain Radiating Towards neck to upper arm   Pain Onset More than a month ago   Pain Frequency Constant   Aggravating Factors  movement, reaching to right   Pain Relieving Factors pain medications and rest   Effect of Pain on Daily Activities limited ability to  use LUE for daily tasks   Multiple Pain Sites No            [facility] OT Assessment - 05/21/16 1439    Assessment   Diagnosis Left shoulder arthroscopy   Precautions   Precautions None                  OT Treatments/Exercises (OP) - 05/21/16 1443    Exercises   Exercises Shoulder   Shoulder Exercises: Supine   Protraction PROM;10 reps;AAROM;5 reps   Horizontal ABduction PROM;10 reps;AAROM;5 reps   External Rotation PROM;10 reps;AAROM;5 reps   Internal Rotation PROM;10 reps;AAROM;5 reps   Flexion PROM;10 reps;AAROM;5 reps   ABduction PROM;10 reps;AAROM;5 reps   Modalities   Modalities Moist Heat;Electrical Stimulation   Moist Heat Therapy   Number Minutes Moist Heat 15 Minutes   Moist Heat  Location Shoulder   Electrical Stimulation   Electrical Stimulation Location Left shoulder   Electrical Stimulation Action interferential   Electrical Stimulation Parameters 6.0 CV   Electrical Stimulation Goals Pain   Manual Therapy   Manual Therapy Myofascial release   Manual therapy comments Manual therapy completed prior to exercises.   Myofascial Release Myofascial release and manual stretching completed to left upper arm, trapezius, and scapularis region to decrease fascial restrictions and increase joint mobility in a pain free zone.                   OT Short Term Goals - 05/21/16 1521    OT SHORT TERM GOAL #1   Title Pt will be educated on and independent in HEP.    Time 4   Period Weeks   Status On-going   OT SHORT TERM GOAL #2   Title Pt will return to prior level of functioning and independence during daily tasks.    Time 4   Period Weeks   Status On-going   OT SHORT TERM GOAL #3   Title Pt will decrease fascial restrictions in LUE from max to moderate amounts or less to increase LUE mobility.    Time 4   Period Weeks   Status On-going   OT SHORT TERM GOAL #4   Title Pt will decrease pain to 3/10 or less in LUE to increase ability to perform daily tasks.    Time 4   Period Weeks   Status On-going   OT SHORT TERM GOAL #5   Title Pt will increase LUE A/ROM to [facility] to increase ability to perform bathing tasks.    Time 4   Period Weeks   Status On-going   OT SHORT TERM GOAL #6   Title Pt will increase LUE strength to 4/5 to increase ability to carry a lift and carry weighted items.    Time 4   Period Weeks   Status On-going                  Plan - 05/21/16 1519    Clinical Impression Statement A: Initiated myofascial release, manual therapy, P/ROM, AA/ROM this session, pt reports throbbing pain intermittently during AA/ROM. Pt able to achieve approximately 50-60% range during AA/ROM. E-Stim and moist heat applied at end of session for pain  management, pt reports pain as "soothed" at end of ES. Provided pt with information on [employer] TENS coverage policy and encouraged to call for specific information.    Rehab Potential Good   OT Frequency 2x / week   OT Duration 4 weeks   OT Treatment/Interventions Self-care/ADL training;Passive  range of motion;Patient/family education;Cryotherapy;Electrical Stimulation;Moist Heat;Therapeutic exercise;Manual Therapy;Therapeutic activities   Plan P: Continue AA/ROM focusing on improving range within pain tolerance, add thumb tacks   Consulted and Agree with Plan of Care Patient      Patient will benefit from skilled therapeutic intervention in order to improve the following deficits and impairments:  Decreased strength, Pain, Impaired UE functional use, Decreased activity tolerance, Decreased range of motion, Increased fascial restricitons, Impaired flexibility  Visit Diagnosis: Pain in left shoulder  Stiffness of left shoulder, not elsewhere classified  Other symptoms and signs involving the musculoskeletal system    Problem List Patient Active Problem List   Diagnosis Date Noted  . OSA on CPAP 04/01/2015  . Subacute confusional state 04/01/2015  . Hallucinations, visual 04/01/2015  . Hypnapompic hallucinations 04/01/2015  . Sleep apnea with use of continuous positive airway pressure (CPAP)   . Hypersomnia, persistent 03/30/2013  . Obstructive sleep apnea (adult) (pediatric) 03/30/2013  . CONSTIPATION 11/18/2008  . RECTAL BLEEDING 11/18/2008  . HEMORRHOIDS, INTERNAL 11/15/2008  . DIVERTICULOSIS, COLON 11/15/2008  . CELLULITIS/ABSCESS, TRUNK 02/22/2008  . LOW BACK PAIN 02/22/2008  . DIZZINESS 02/22/2008  . FATIGUE 02/22/2008  . PALPITATIONS 02/22/2008  . MURMUR, CARDIAC, UNDIAGNOSED 02/22/2008  . BREAST CANCER, HX OF 02/22/2008  . COUGH 01/05/2008  . DEPRESSION 07/05/2007  . UTERINE POLYP 07/05/2007  . FIBROMYALGIA 07/05/2007  . COLONIC POLYPS, HX OF 07/05/2007  . HX,  PERSONAL, CERVICAL DYSPLASIA 07/05/2007    Guadelupe Sabin, OTR/L  [phone]  05/21/2016, 3:38 PM  [facility] [address] [place], [place], [address] Phone: [phone]   Fax:  [phone]  Name: [name] MRN: [id number] Date of Birth: Apr 28, 1961

## 2016-05-24 ENCOUNTER — Ambulatory Visit (HOSPITAL_COMMUNITY): Payer: BLUE CROSS/BLUE SHIELD

## 2016-05-24 DIAGNOSIS — M25612 Stiffness of left shoulder, not elsewhere classified: Secondary | ICD-10-CM

## 2016-05-24 DIAGNOSIS — M25512 Pain in left shoulder: Secondary | ICD-10-CM | POA: Diagnosis not present

## 2016-05-24 DIAGNOSIS — R29898 Other symptoms and signs involving the musculoskeletal system: Secondary | ICD-10-CM

## 2016-05-24 NOTE — Therapy (Signed)
[facility], [place], [address] Phone: [phone]   Fax:  [phone]  Occupational Therapy Treatment  Patient Details  Name: [name] MRN: [id number] Date of Birth: September 08, 1961 Referring Provider: Dr. Jean Rosenthal  Encounter Date: 05/24/2016      OT End of Session - 05/24/16 1452    Visit Number 3   Number of Visits 8   Date for OT Re-Evaluation 06/16/16   Authorization Type BCBS   OT Start Time 1355   OT Stop Time 1437   OT Time Calculation (min) 42 min   Activity Tolerance Patient tolerated treatment well   Behavior During Therapy [facility] for tasks assessed/performed      Past Medical History  Diagnosis Date  . Anxiety   . Depression, major ([place])     suicidal thoughts -implants, mental health, 05/11  . Fibromyalgia   . Breast reconstruction deformity   . Hypersomnia     sleep attacks  . Hypothyroidism   . Headache(784.0)   . Migraine headache   . Neoplasm     malignant breast, history of status post bilateral  . Breast cancer ([place])     rt breast  and left breast breast removed  . Sleep apnea with use of continuous positive airway pressure (CPAP)     AHI 20 in HST, titration to 6 cm water - residual AHi 6 . remaining hypersomnic.   Marland Kitchen Complication of anesthesia     shaking-after mastectomies-resp and hr dropped  . Post-operative nausea and vomiting   . Sleep apnea     CPAP  . Narcolepsy     Past Surgical History  Procedure Laterality Date  . Hysterestomy  2005  . Carpal tunnel release  2011    both hands  . Wisdom teeth out    . Btl    . Breast reconstructioin  7/09    flaps-bilat mastectomies  . Colonoscopy w/ polypectomy  4/03    tubulovillous adenoma  . Post- a -cath  2008  . Mastectomy Bilateral 2008  . Ganglion cyst excision      both [phone]  . Finger arthroscopy with carpometacarpel (cmc) arthroplasty Right 06/20/2014    Procedure: ARTHROSCOPY RIGHT THUMB  CARPOMETACARPEL GRAFT JACKET INTERPOSITION PARTIAL TRAPEZIECTOMY POSSIBLE OPEN;  Surgeon: Daryll Brod, MD;  Location: [place];  Service: Orthopedics;  Laterality: Right;  . Carpometacarpel suspension plasty Right 08/29/2014    Procedure: REMOVAL DERMASPAN SUSPENSIONPLASTY RIGHT THUMB ABDUCTOR POLLIS TRANSFER;  Surgeon: Daryll Brod, MD;  Location: [place];  Service: Orthopedics;  Laterality: Right;  . Trigger finger release Right 08/29/2014    Procedure: RELEASE TRIGGER FINGER/A-1 PULLEY;  Surgeon: Daryll Brod, MD;  Location: [place];  Service: Orthopedics;  Laterality: Right;    There were no vitals filed for this visit.      Subjective Assessment - 05/24/16 1420    Subjective  S: My pain is worse since last time.    Currently in Pain? Yes   Pain Score 7    Pain Location Shoulder   Pain Orientation Left   Pain Descriptors / Indicators Aching   Pain Type Acute pain            [facility] OT Assessment - 05/24/16 1421    Assessment   Diagnosis Left shoulder arthroscopy   Precautions   Precautions None                  OT  Treatments/Exercises (OP) - 05/24/16 1421    Exercises   Exercises Shoulder   Shoulder Exercises: Supine   Protraction PROM;10 reps;AAROM;5 reps   Horizontal ABduction PROM;10 reps;AAROM;5 reps  only to the left due to pain   External Rotation PROM;10 reps   Internal Rotation PROM;10 reps   Flexion PROM;10 reps;AAROM;5 reps   ABduction PROM;10 reps   Modalities   Modalities Moist Heat;Electrical Stimulation   Moist Heat Therapy   Number Minutes Moist Heat 10 Minutes   Moist Heat Location Shoulder   Electrical Stimulation   Electrical Stimulation Location Left shoulder   Electrical Stimulation Action interential   Electrical Stimulation Parameters 8.0 CV   Electrical Stimulation Goals Pain   Manual Therapy   Manual Therapy Myofascial release   Manual therapy comments Manual therapy completed  prior to exercises.   Myofascial Release Myofascial release and manual stretching completed to left upper arm, trapezius, and scapularis region to decrease fascial restrictions and increase joint mobility in a pain free zone.                   OT Short Term Goals - 05/21/16 1521    OT SHORT TERM GOAL #1   Title Pt will be educated on and independent in HEP.    Time 4   Period Weeks   Status On-going   OT SHORT TERM GOAL #2   Title Pt will return to prior level of functioning and independence during daily tasks.    Time 4   Period Weeks   Status On-going   OT SHORT TERM GOAL #3   Title Pt will decrease fascial restrictions in LUE from max to moderate amounts or less to increase LUE mobility.    Time 4   Period Weeks   Status On-going   OT SHORT TERM GOAL #4   Title Pt will decrease pain to 3/10 or less in LUE to increase ability to perform daily tasks.    Time 4   Period Weeks   Status On-going   OT SHORT TERM GOAL #5   Title Pt will increase LUE A/ROM to [facility] to increase ability to perform bathing tasks.    Time 4   Period Weeks   Status On-going   OT SHORT TERM GOAL #6   Title Pt will increase LUE strength to 4/5 to increase ability to carry a lift and carry weighted items.    Time 4   Period Weeks   Status On-going                  Plan - 05/24/16 1452    Clinical Impression Statement A: Pt with increased pain at beginning of session. Pt reports that external rotation with shoulder abducted and horizontal adduction. Stayed within patient's pain tolerance during those movement and complete ES and moist heat at end of session. Pt reports a pain level of 4/10 at end of session.    Plan P: Continue with AA/ROM focusing on improving range within pain tolerance, add thumb tacks.      Patient will benefit from skilled therapeutic intervention in order to improve the following deficits and impairments:  Decreased strength, Pain, Impaired UE functional use,  Decreased activity tolerance, Decreased range of motion, Increased fascial restricitons, Impaired flexibility  Visit Diagnosis: Pain in left shoulder  Other symptoms and signs involving the musculoskeletal system  Stiffness of left shoulder, not elsewhere classified    Problem List Patient Active Problem List   Diagnosis Date Noted  .  OSA on CPAP 04/01/2015  . Subacute confusional state 04/01/2015  . Hallucinations, visual 04/01/2015  . Hypnapompic hallucinations 04/01/2015  . Sleep apnea with use of continuous positive airway pressure (CPAP)   . Hypersomnia, persistent 03/30/2013  . Obstructive sleep apnea (adult) (pediatric) 03/30/2013  . CONSTIPATION 11/18/2008  . RECTAL BLEEDING 11/18/2008  . HEMORRHOIDS, INTERNAL 11/15/2008  . DIVERTICULOSIS, COLON 11/15/2008  . CELLULITIS/ABSCESS, TRUNK 02/22/2008  . LOW BACK PAIN 02/22/2008  . DIZZINESS 02/22/2008  . FATIGUE 02/22/2008  . PALPITATIONS 02/22/2008  . MURMUR, CARDIAC, UNDIAGNOSED 02/22/2008  . BREAST CANCER, HX OF 02/22/2008  . COUGH 01/05/2008  . DEPRESSION 07/05/2007  . UTERINE POLYP 07/05/2007  . FIBROMYALGIA 07/05/2007  . COLONIC POLYPS, HX OF 07/05/2007  . HX, PERSONAL, CERVICAL DYSPLASIA 07/05/2007    Ailene Ravel, OTR/L,CBIS  [phone]  05/24/2016, 2:59 PM  [place] [address] [place], [place], [address] Phone: [phone]   Fax:  [phone]  Name: [name] MRN: [id number] Date of Birth: 1960-12-18

## 2016-05-26 ENCOUNTER — Encounter (HOSPITAL_COMMUNITY): Payer: Self-pay | Admitting: Occupational Therapy

## 2016-05-26 ENCOUNTER — Ambulatory Visit (HOSPITAL_COMMUNITY): Payer: BLUE CROSS/BLUE SHIELD | Admitting: Occupational Therapy

## 2016-05-26 DIAGNOSIS — R29898 Other symptoms and signs involving the musculoskeletal system: Secondary | ICD-10-CM

## 2016-05-26 DIAGNOSIS — M25612 Stiffness of left shoulder, not elsewhere classified: Secondary | ICD-10-CM

## 2016-05-26 DIAGNOSIS — M25512 Pain in left shoulder: Secondary | ICD-10-CM | POA: Diagnosis not present

## 2016-05-26 NOTE — Therapy (Signed)
[place], [place], [address] Phone: [phone]   Fax:  [phone]  Occupational Therapy Treatment  Patient Details  Name: [name] MRN: [id number] Date of Birth: September 24, 1961 Referring Provider: Dr. Jean Rosenthal  Encounter Date: 05/26/2016      OT End of Session - 05/26/16 1520    Visit Number 4   Number of Visits 8   Date for OT Re-Evaluation 06/16/16   Authorization Type BCBS   OT Start Time 1354  pt arrived late   OT Stop Time 1440   OT Time Calculation (min) 46 min   Activity Tolerance Patient tolerated treatment well   Behavior During Therapy [facility] for tasks assessed/performed      Past Medical History  Diagnosis Date  . Anxiety   . Depression, major ([place])     suicidal thoughts -implants, mental health, 05/11  . Fibromyalgia   . Breast reconstruction deformity   . Hypersomnia     sleep attacks  . Hypothyroidism   . Headache(784.0)   . Migraine headache   . Neoplasm     malignant breast, history of status post bilateral  . Breast cancer ([place])     rt breast  and left breast breast removed  . Sleep apnea with use of continuous positive airway pressure (CPAP)     AHI 20 in HST, titration to 6 cm water - residual AHi 6 . remaining hypersomnic.   Marland Kitchen Complication of anesthesia     shaking-after mastectomies-resp and hr dropped  . Post-operative nausea and vomiting   . Sleep apnea     CPAP  . Narcolepsy     Past Surgical History  Procedure Laterality Date  . Hysterestomy  2005  . Carpal tunnel release  2011    both hands  . Wisdom teeth out    . Btl    . Breast reconstructioin  7/09    flaps-bilat mastectomies  . Colonoscopy w/ polypectomy  4/03    tubulovillous adenoma  . Post- a -cath  2008  . Mastectomy Bilateral 2008  . Ganglion cyst excision      both [phone]  . Finger arthroscopy with carpometacarpel (cmc) arthroplasty Right 06/20/2014    Procedure: ARTHROSCOPY  RIGHT THUMB CARPOMETACARPEL GRAFT JACKET INTERPOSITION PARTIAL TRAPEZIECTOMY POSSIBLE OPEN;  Surgeon: Daryll Brod, MD;  Location: [place];  Service: Orthopedics;  Laterality: Right;  . Carpometacarpel suspension plasty Right 08/29/2014    Procedure: REMOVAL DERMASPAN SUSPENSIONPLASTY RIGHT THUMB ABDUCTOR POLLIS TRANSFER;  Surgeon: Daryll Brod, MD;  Location: [place];  Service: Orthopedics;  Laterality: Right;  . Trigger finger release Right 08/29/2014    Procedure: RELEASE TRIGGER FINGER/A-1 PULLEY;  Surgeon: Daryll Brod, MD;  Location: [place];  Service: Orthopedics;  Laterality: Right;    There were no vitals filed for this visit.      Subjective Assessment - 05/26/16 1355    Subjective  S: I've been really sore lately.    Currently in Pain? Yes   Pain Score 7    Pain Location Shoulder   Pain Orientation Left   Pain Descriptors / Indicators Aching   Pain Type Acute pain   Pain Radiating Towards neck to upper arm   Pain Onset More than a month ago   Pain Frequency Constant   Aggravating Factors  movement, reaching across body to right   Pain Relieving Factors pain medications and rest   Effect of Pain on Daily  Activities limited ability to use LUE for daily tasks.    Multiple Pain Sites No            [facility] OT Assessment - 05/26/16 1354    Assessment   Diagnosis Left shoulder arthroscopy   Precautions   Precautions None                  OT Treatments/Exercises (OP) - 05/26/16 1357    Exercises   Exercises Shoulder   Shoulder Exercises: Supine   Protraction PROM;5 reps;AAROM;10 reps   Horizontal ABduction PROM;10 reps;AAROM;5 reps   External Rotation PROM;5 reps;AAROM;10 reps   Internal Rotation PROM;5 reps;AAROM;10 reps   Flexion PROM;5 reps;AAROM  8 reps AA/ROM   ABduction PROM;5 reps   Shoulder Exercises: Seated   Extension AROM;5 reps   Retraction AROM;5 reps   Modalities   Modalities Moist  Heat;Electrical Stimulation   Moist Heat Therapy   Number Minutes Moist Heat 15 Minutes   Moist Heat Location Shoulder   Electrical Stimulation   Electrical Stimulation Location Left shoulder   Electrical Stimulation Action Interferential   Electrical Stimulation Parameters 6.0 CV   Electrical Stimulation Goals Pain   Manual Therapy   Manual Therapy Myofascial release   Manual therapy comments Manual therapy completed prior to exercises.   Myofascial Release Myofascial release and manual stretching completed to left upper arm, trapezius, and scapularis region to decrease fascial restrictions and increase joint mobility in a pain free zone.                   OT Short Term Goals - 05/21/16 1521    OT SHORT TERM GOAL #1   Title Pt will be educated on and independent in HEP.    Time 4   Period Weeks   Status On-going   OT SHORT TERM GOAL #2   Title Pt will return to prior level of functioning and independence during daily tasks.    Time 4   Period Weeks   Status On-going   OT SHORT TERM GOAL #3   Title Pt will decrease fascial restrictions in LUE from max to moderate amounts or less to increase LUE mobility.    Time 4   Period Weeks   Status On-going   OT SHORT TERM GOAL #4   Title Pt will decrease pain to 3/10 or less in LUE to increase ability to perform daily tasks.    Time 4   Period Weeks   Status On-going   OT SHORT TERM GOAL #5   Title Pt will increase LUE A/ROM to [facility] to increase ability to perform bathing tasks.    Time 4   Period Weeks   Status On-going   OT SHORT TERM GOAL #6   Title Pt will increase LUE strength to 4/5 to increase ability to carry a lift and carry weighted items.    Time 4   Period Weeks   Status On-going                  Plan - 05/26/16 1520    Clinical Impression Statement A: Pt with increased pain today, reports pain with any movement. Pt able to tolerate P/ROM to approximately 70% in flexion, 40% in abduction.  Completed AA/ROM within pain tolerance, increasing to 10 repetitions. Verbal cuing for form. ES and moist heat at end of session, pt reports pain is "soothed."    Rehab Potential Good   OT Frequency 2x / week   OT  Duration 4 weeks   OT Treatment/Interventions Self-care/ADL training;Passive range of motion;Patient/family education;Cryotherapy;Electrical Stimulation;Moist Heat;Therapeutic exercise;Manual Therapy;Therapeutic activities   Plan P: Continue wtih AA/ROM within pain tolerance, add thumb tacks and wall wash   Consulted and Agree with Plan of Care Patient      Patient will benefit from skilled therapeutic intervention in order to improve the following deficits and impairments:  Decreased strength, Pain, Impaired UE functional use, Decreased activity tolerance, Decreased range of motion, Increased fascial restricitons, Impaired flexibility  Visit Diagnosis: Pain in left shoulder  Other symptoms and signs involving the musculoskeletal system  Stiffness of left shoulder, not elsewhere classified    Problem List Patient Active Problem List   Diagnosis Date Noted  . OSA on CPAP 04/01/2015  . Subacute confusional state 04/01/2015  . Hallucinations, visual 04/01/2015  . Hypnapompic hallucinations 04/01/2015  . Sleep apnea with use of continuous positive airway pressure (CPAP)   . Hypersomnia, persistent 03/30/2013  . Obstructive sleep apnea (adult) (pediatric) 03/30/2013  . CONSTIPATION 11/18/2008  . RECTAL BLEEDING 11/18/2008  . HEMORRHOIDS, INTERNAL 11/15/2008  . DIVERTICULOSIS, COLON 11/15/2008  . CELLULITIS/ABSCESS, TRUNK 02/22/2008  . LOW BACK PAIN 02/22/2008  . DIZZINESS 02/22/2008  . FATIGUE 02/22/2008  . PALPITATIONS 02/22/2008  . MURMUR, CARDIAC, UNDIAGNOSED 02/22/2008  . BREAST CANCER, HX OF 02/22/2008  . COUGH 01/05/2008  . DEPRESSION 07/05/2007  . UTERINE POLYP 07/05/2007  . FIBROMYALGIA 07/05/2007  . COLONIC POLYPS, HX OF 07/05/2007  . HX, PERSONAL,  CERVICAL DYSPLASIA 07/05/2007    Guadelupe Sabin, OTR/L  [phone]  05/26/2016, 3:23 PM  [place] [address] [place], [place], [address] Phone: [phone]   Fax:  [phone]  Name: ELISHAH BEHEN MRN: [id number] Date of Birth: 12-Mar-1961

## 2016-05-31 ENCOUNTER — Encounter (HOSPITAL_COMMUNITY): Payer: Self-pay

## 2016-05-31 ENCOUNTER — Ambulatory Visit (HOSPITAL_COMMUNITY): Payer: BLUE CROSS/BLUE SHIELD

## 2016-05-31 DIAGNOSIS — M25512 Pain in left shoulder: Secondary | ICD-10-CM

## 2016-05-31 DIAGNOSIS — R29898 Other symptoms and signs involving the musculoskeletal system: Secondary | ICD-10-CM

## 2016-05-31 DIAGNOSIS — M25612 Stiffness of left shoulder, not elsewhere classified: Secondary | ICD-10-CM

## 2016-05-31 NOTE — Patient Instructions (Signed)
Perform each exercise ___10_____ reps. 2-3x days.   Protraction - STANDING  Start by holding a wand or cane at chest height.  Next, slowly push the wand outwards in front of your body so that your elbows become fully straightened. Then, return to the original position.     Shoulder FLEXION - STANDING - PALMS Down  In the standing position, hold a wand/cane with both arms, palms down on both sides. Raise up the wand/cane allowing your unaffected arm to perform most of the effort. Your affected arm should be partially relaxed.      Internal/External ROTATION - STANDING  In the standing position, hold a wand/cane with both hands keeping your elbows bent. Move your arms and wand/cane to one side.  Your affected arm should be partially relaxed while your unaffected arm performs most of the effort.       Shoulder ABDUCTION - STANDING  While holding a wand/cane palm face up on the injured side and palm face down on the uninjured side, slowly raise up your injured arm to the side.       Horizontal Abduction/Adduction      Straight arms holding cane at shoulder height, bring cane to right, center, left. Repeat starting to left.   Copyright  VHI. All rights reserved.

## 2016-05-31 NOTE — Therapy (Signed)
[place], [place], [address] Phone: [phone]   Fax:  [phone]  Occupational Therapy Treatment  Patient Details  Name: [name] MRN: [id number] Date of Birth: 1961-10-08 Referring Provider: Dr. Jean Rosenthal  Encounter Date: 05/31/2016      OT End of Session - 05/31/16 1541    Visit Number 5   Number of Visits 8   Date for OT Re-Evaluation 06/16/16   Authorization Type BCBS   OT Start Time 1455  Pt arrived late   OT Stop Time 1540   OT Time Calculation (min) 45 min   Activity Tolerance Patient limited by pain   Behavior During Therapy [facility] for tasks assessed/performed      Past Medical History:  Diagnosis Date  . Anxiety   . Breast cancer ([place])    rt breast  and left breast breast removed  . Breast reconstruction deformity   . Complication of anesthesia    shaking-after mastectomies-resp and hr dropped  . Depression, major ([place])    suicidal thoughts -implants, mental health, 05/11  . Fibromyalgia   . Headache(784.0)   . Hypersomnia    sleep attacks  . Hypothyroidism   . Migraine headache   . Narcolepsy   . Neoplasm    malignant breast, history of status post bilateral  . Post-operative nausea and vomiting   . Sleep apnea    CPAP  . Sleep apnea with use of continuous positive airway pressure (CPAP)    AHI 20 in HST, titration to 6 cm water - residual AHi 6 . remaining hypersomnic.     Past Surgical History:  Procedure Laterality Date  . Breast reconstructioin  7/09   flaps-bilat mastectomies  . BTL    . CARPAL TUNNEL RELEASE  2011   both hands  . CARPOMETACARPEL SUSPENSION PLASTY Right 08/29/2014   Procedure: REMOVAL DERMASPAN SUSPENSIONPLASTY RIGHT THUMB ABDUCTOR POLLIS TRANSFER;  Surgeon: Daryll Brod, MD;  Location: [place];  Service: Orthopedics;  Laterality: Right;  . COLONOSCOPY W/ POLYPECTOMY  4/03   tubulovillous adenoma  . FINGER ARTHROSCOPY  WITH CARPOMETACARPEL [facility]) ARTHROPLASTY Right 06/20/2014   Procedure: ARTHROSCOPY RIGHT THUMB CARPOMETACARPEL GRAFT JACKET INTERPOSITION PARTIAL TRAPEZIECTOMY POSSIBLE OPEN;  Surgeon: Daryll Brod, MD;  Location: [place];  Service: Orthopedics;  Laterality: Right;  . GANGLION CYST EXCISION     both [phone]  . hysterestomy  2005  . MASTECTOMY Bilateral 2008  . post- a -cath  2008  . TRIGGER FINGER RELEASE Right 08/29/2014   Procedure: RELEASE TRIGGER FINGER/A-1 PULLEY;  Surgeon: Daryll Brod, MD;  Location: [place];  Service: Orthopedics;  Laterality: Right;  . wisdom teeth out      There were no vitals filed for this visit.      Subjective Assessment - 05/31/16 1519    Subjective  S: My TENs unit came in the mail today. I was hoping you could help me figure out how to use it.    Currently in Pain? Yes   Pain Score 5    Pain Location Shoulder   Pain Orientation Left   Pain Descriptors / Indicators Aching   Pain Type Acute pain            [facility] OT Assessment - 05/31/16 1520      Assessment   Diagnosis Left shoulder arthroscopy     Precautions   Precautions None  OT Treatments/Exercises (OP) - 05/31/16 1520      Exercises   Exercises Shoulder     Shoulder Exercises: Supine   Protraction AAROM;10 reps   Horizontal ABduction AAROM;10 reps   External Rotation AAROM;10 reps   Internal Rotation AAROM;10 reps   Flexion AAROM;10 reps   ABduction AAROM;10 reps     Shoulder Exercises: ROM/Strengthening   Other ROM/Strengthening Exercises PVC pipe slide against wall. 10X able to achieve 40-50% range with increased pain in left deltoid (burning)                OT Education - 05/31/16 1522    Education provided Yes   Education Details Patient brought in personal TENs unit that came in the mail. Therapist provided patient with education on use, set-up, pad placement, mode,etc.  Updated HEP to increase  AA/ROM exercises   Person(s) Educated Patient   Methods Explanation;Demonstration;Verbal cues;Handout   Comprehension Verbalized understanding;Returned demonstration          OT Short Term Goals - 05/21/16 1521      OT SHORT TERM GOAL #1   Title Pt will be educated on and independent in HEP.    Time 4   Period Weeks   Status On-going     OT SHORT TERM GOAL #2   Title Pt will return to prior level of functioning and independence during daily tasks.    Time 4   Period Weeks   Status On-going     OT SHORT TERM GOAL #3   Title Pt will decrease fascial restrictions in LUE from max to moderate amounts or less to increase LUE mobility.    Time 4   Period Weeks   Status On-going     OT SHORT TERM GOAL #4   Title Pt will decrease pain to 3/10 or less in LUE to increase ability to perform daily tasks.    Time 4   Period Weeks   Status On-going     OT SHORT TERM GOAL #5   Title Pt will increase LUE A/ROM to [facility] to increase ability to perform bathing tasks.    Time 4   Period Weeks   Status On-going     OT SHORT TERM GOAL #6   Title Pt will increase LUE strength to 4/5 to increase ability to carry a lift and carry weighted items.    Time 4   Period Weeks   Status On-going                  Plan - 05/31/16 1542    Clinical Impression Statement A: Pt brought in personal TENs unit and requested assistance with set-up and learning how to manage it. Pt arrived late for session and manual therapy was not completed. Pt reports that manual therapy usually flares up her pain and we discussed next session completing it at the end of the session versus the beginning to see if that has any effect on how she is able to complete the exercises.    Plan P: Complete passive stretching and exercises FIRST and then do myofascial release at END of session. Add wall wash and thumb tacks.       Patient will benefit from skilled therapeutic intervention in order to improve the following  deficits and impairments:  Decreased strength, Pain, Impaired UE functional use, Decreased activity tolerance, Decreased range of motion, Increased fascial restricitons, Impaired flexibility  Visit Diagnosis: Pain in left shoulder  Other symptoms and signs involving the musculoskeletal  system  Stiffness of left shoulder, not elsewhere classified    Problem List Patient Active Problem List   Diagnosis Date Noted  . OSA on CPAP 04/01/2015  . Subacute confusional state 04/01/2015  . Hallucinations, visual 04/01/2015  . Hypnapompic hallucinations 04/01/2015  . Sleep apnea with use of continuous positive airway pressure (CPAP)   . Hypersomnia, persistent 03/30/2013  . Obstructive sleep apnea (adult) (pediatric) 03/30/2013  . CONSTIPATION 11/18/2008  . RECTAL BLEEDING 11/18/2008  . HEMORRHOIDS, INTERNAL 11/15/2008  . DIVERTICULOSIS, COLON 11/15/2008  . CELLULITIS/ABSCESS, TRUNK 02/22/2008  . LOW BACK PAIN 02/22/2008  . DIZZINESS 02/22/2008  . FATIGUE 02/22/2008  . PALPITATIONS 02/22/2008  . MURMUR, CARDIAC, UNDIAGNOSED 02/22/2008  . BREAST CANCER, HX OF 02/22/2008  . COUGH 01/05/2008  . DEPRESSION 07/05/2007  . UTERINE POLYP 07/05/2007  . FIBROMYALGIA 07/05/2007  . COLONIC POLYPS, HX OF 07/05/2007  . HX, PERSONAL, CERVICAL DYSPLASIA 07/05/2007   Ailene Ravel, OTR/L,CBIS  [phone]  05/31/2016, 3:46 PM  [place] [address] [place], [place], [address] Phone: [phone]   Fax:  [phone]  Name: CECLIA DILES MRN: [id number] Date of Birth: 01-17-1961

## 2016-06-02 ENCOUNTER — Ambulatory Visit (HOSPITAL_COMMUNITY): Payer: BLUE CROSS/BLUE SHIELD | Admitting: Specialist

## 2016-06-02 ENCOUNTER — Telehealth (HOSPITAL_COMMUNITY): Payer: Self-pay

## 2016-06-07 ENCOUNTER — Ambulatory Visit (HOSPITAL_COMMUNITY): Payer: BLUE CROSS/BLUE SHIELD | Admitting: Specialist

## 2016-06-07 ENCOUNTER — Telehealth (HOSPITAL_COMMUNITY): Payer: Self-pay | Admitting: Specialist

## 2016-06-07 NOTE — Telephone Encounter (Signed)
Patient did not attend scheduled visit.  Therapist called patient and she stated she had forgotten.  She does not wish to reschedule.  Therapist reminded patient of next appointment on 06/09/16 at 145 pm.   Vangie Bicker, [place], OTR/L [phone]

## 2016-06-09 ENCOUNTER — Ambulatory Visit (HOSPITAL_COMMUNITY): Payer: BLUE CROSS/BLUE SHIELD | Attending: Orthopaedic Surgery | Admitting: Occupational Therapy

## 2016-06-09 ENCOUNTER — Encounter (HOSPITAL_COMMUNITY): Payer: Self-pay | Admitting: Occupational Therapy

## 2016-06-09 DIAGNOSIS — R29898 Other symptoms and signs involving the musculoskeletal system: Secondary | ICD-10-CM

## 2016-06-09 DIAGNOSIS — M25512 Pain in left shoulder: Secondary | ICD-10-CM | POA: Diagnosis not present

## 2016-06-09 DIAGNOSIS — M25612 Stiffness of left shoulder, not elsewhere classified: Secondary | ICD-10-CM | POA: Diagnosis present

## 2016-06-09 NOTE — Therapy (Signed)
[place], [place], [address] Phone: [phone]   Fax:  [phone]  Occupational Therapy Reassessment, Treatment, and Discharge  Patient Details  Name: [name] MRN: [id number] Date of Birth: Mar 04, 1961 Referring Provider: Dr. Jean Rosenthal  Encounter Date: 06/09/2016      OT End of Session - 06/09/16 1453    Visit Number 6   Number of Visits 8   Date for OT Re-Evaluation 06/16/16   Authorization Type BC[employer]   OT Start Time 1350   OT Stop Time 1440   OT Time Calculation (min) 50 min   Activity Tolerance Patient limited by pain   Behavior During Therapy [facility] for tasks assessed/performed      Past Medical History:  Diagnosis Date  . Anxiety   . Breast cancer ([place])    rt breast  and left breast breast removed  . Breast reconstruction deformity   . Complication of anesthesia    shaking-after mastectomies-resp and hr dropped  . Depression, major ([place])    suicidal thoughts -implants, mental health, 05/11  . Fibromyalgia   . Headache(784.0)   . Hypersomnia    sleep attacks  . Hypothyroidism   . Migraine headache   . Narcolepsy   . Neoplasm    malignant breast, history of status post bilateral  . Post-operative nausea and vomiting   . Sleep apnea    CPAP  . Sleep apnea with use of continuous positive airway pressure (CPAP)    AHI 20 in HST, titration to 6 cm water - residual AHi 6 . remaining hypersomnic.     Past Surgical History:  Procedure Laterality Date  . Breast reconstructioin  7/09   flaps-bilat mastectomies  . BTL    . CARPAL TUNNEL RELEASE  2011   both hands  . CARPOMETACARPEL SUSPENSION PLASTY Right 08/29/2014   Procedure: REMOVAL DERMASPAN SUSPENSIONPLASTY RIGHT THUMB ABDUCTOR POLLIS TRANSFER;  Surgeon: Daryll Brod, MD;  Location: [facility];  Service: Orthopedics;  Laterality: Right;  . COLONOSCOPY W/ POLYPECTOMY  4/03   tubulovillous adenoma  . FINGER  ARTHROSCOPY WITH CARPOMETACARPEL [facility]) ARTHROPLASTY Right 06/20/2014   Procedure: ARTHROSCOPY RIGHT THUMB CARPOMETACARPEL GRAFT JACKET INTERPOSITION PARTIAL TRAPEZIECTOMY POSSIBLE OPEN;  Surgeon: Daryll Brod, MD;  Location: [facility];  Service: Orthopedics;  Laterality: Right;  . GANGLION CYST EXCISION     both [phone]  . hysterestomy  2005  . MASTECTOMY Bilateral 2008  . post- a -cath  2008  . TRIGGER FINGER RELEASE Right 08/29/2014   Procedure: RELEASE TRIGGER FINGER/A-1 PULLEY;  Surgeon: Daryll Brod, MD;  Location: [place];  Service: Orthopedics;  Laterality: Right;  . wisdom teeth out      There were no vitals filed for this visit.      Subjective Assessment - 06/09/16 1352    Subjective  S: I have trouble reaching across my body to wash my right side.    Currently in Pain? Yes   Pain Score 3    Pain Location Shoulder   Pain Orientation Left   Pain Descriptors / Indicators Aching   Pain Type Acute pain   Pain Radiating Towards neck to upper arm   Pain Onset More than a month ago   Pain Frequency Constant   Aggravating Factors  reaching across body   Pain Relieving Factors pain medications and rest   Effect of Pain on Daily Activities limited ability to use LUE for daily tasks  Multiple Pain Sites No           [facility] OT Assessment - 06/09/16 1350      Assessment   Diagnosis Left shoulder arthroscopy     Precautions   Precautions None     Palpation   Palpation comment Mod fascial restrictions in left upper arm, trapezius, and scapularis regions     AROM   Overall AROM Comments Assessed seated, ER/IR adducted   AROM Assessment Site Shoulder   Right/Left Shoulder Left   Left Shoulder Flexion 149 Degrees  144 previous   Left Shoulder ABduction 155 Degrees  143 previous   Left Shoulder Internal Rotation 90 Degrees  same as previous   Left Shoulder External Rotation 54 Degrees  46 previous     PROM   Overall PROM Comments  Assessed supine, ER/IR adducted   PROM Assessment Site Shoulder   Right/Left Shoulder Left   Left Shoulder Flexion 152 Degrees  150 previous   Left Shoulder ABduction 90 Degrees  85 previous   Left Shoulder Internal Rotation 90 Degrees  same as previous   Left Shoulder External Rotation 40 Degrees  48 previous     Strength   Overall Strength Comments Assessed seated, ER/IR adducted   Strength Assessment Site Shoulder   Right/Left Shoulder Left   Left Shoulder Flexion 4/5  3+/5 previous   Left Shoulder ABduction 4/5  3/5 previous   Left Shoulder Internal Rotation 4/5  3+/5 previous   Left Shoulder External Rotation 4/5  3/5 previous                  OT Treatments/Exercises (OP) - 06/09/16 1354      Exercises   Exercises Shoulder     Shoulder Exercises: Supine   Protraction PROM;5 reps   Horizontal ABduction PROM;5 reps   External Rotation PROM;5 reps   Internal Rotation PROM;5 reps   Flexion PROM;5 reps   ABduction PROM;5 reps     Shoulder Exercises: Stretch   Cross Chest Stretch 2 reps;10 seconds   Internal Rotation Stretch 1 rep  10 seconds   External Rotation Stretch 2 reps;10 seconds   Wall Stretch - Flexion 1 rep;10 seconds     Modalities   Modalities Moist Heat;Electrical Stimulation     Moist Heat Therapy   Number Minutes Moist Heat 15 Minutes   Moist Heat Location Shoulder     Electrical Stimulation   Electrical Stimulation Location Left shoulder   Electrical Stimulation Action interferential   Electrical Stimulation Parameters 6.4 CV   Electrical Stimulation Goals Pain     Manual Therapy   Manual Therapy Myofascial release   Manual therapy comments Manual therapy completed prior to exercises.   Myofascial Release Myofascial release and manual stretching completed to left upper arm, trapezius, and scapularis region to decrease fascial restrictions and increase joint mobility in a pain free zone.                OT Education -  06/09/16 1434    Education provided Yes   Education Details shoulder stretches   Person(s) Educated Patient   Methods Explanation;Demonstration;Handout   Comprehension Verbalized understanding;Returned demonstration          OT Short Term Goals - 06/09/16 1448      OT SHORT TERM GOAL #1   Title Pt will be educated on and independent in [place].    Time 4   Period Weeks   Status Achieved     OT SHORT TERM  GOAL #2   Title Pt will return to prior level of functioning and independence during daily tasks.    Time 4   Period Weeks   Status Achieved     OT SHORT TERM GOAL #3   Title Pt will decrease fascial restrictions in LUE from max to moderate amounts or less to increase LUE mobility.    Time 4   Period Weeks   Status Achieved     OT SHORT TERM GOAL #4   Title Pt will decrease pain to 3/10 or less in LUE to increase ability to perform daily tasks.    Time 4   Period Weeks   Status Partially Met     OT SHORT TERM GOAL #5   Title Pt will increase LUE A/ROM to [facility] to increase ability to perform bathing tasks.    Time 4   Period Weeks   Status Partially Met     OT SHORT TERM GOAL #6   Title Pt will increase LUE strength to 4/5 to increase ability to carry a lift and carry weighted items.    Time 4   Period Weeks   Status Achieved                  Plan - 06/09/16 1434    Clinical Impression Statement A: Reassessment completed this session, pt has met 4/6 STGs and has partially met 2/6 STGs. Pt is able to complete ADL tasks with independence, continues to experience pain with movement, and horizontal adduction and internal rotation behind the back are difficult. Pt is pleased with her current level of functioning and requests discharge. Pt provided with HEP to focus on shoulder stretches.    Rehab Potential Good   OT Frequency 2x / week   OT Duration 4 weeks   OT Treatment/Interventions Self-care/ADL training;Passive range of motion;Patient/family  education;Cryotherapy;Electrical Stimulation;Moist Heat;Therapeutic exercise;Manual Therapy;Therapeutic activities   Plan P: Discharge pt.    OT Home Exercise Plan shoulder stretches   Consulted and Agree with Plan of Care Patient      Patient will benefit from skilled therapeutic intervention in order to improve the following deficits and impairments:  Decreased strength, Pain, Impaired UE functional use, Decreased activity tolerance, Decreased range of motion, Increased fascial restricitons, Impaired flexibility  Visit Diagnosis: Pain in left shoulder  Other symptoms and signs involving the musculoskeletal system  Stiffness of left shoulder, not elsewhere classified    Problem List Patient Active Problem List   Diagnosis Date Noted  . OSA on CPAP 04/01/2015  . Subacute confusional state 04/01/2015  . Hallucinations, visual 04/01/2015  . Hypnapompic hallucinations 04/01/2015  . Sleep apnea with use of continuous positive airway pressure (CPAP)   . Hypersomnia, persistent 03/30/2013  . Obstructive sleep apnea (adult) (pediatric) 03/30/2013  . CONSTIPATION 11/18/2008  . RECTAL BLEEDING 11/18/2008  . HEMORRHOIDS, INTERNAL 11/15/2008  . DIVERTICULOSIS, COLON 11/15/2008  . CELLULITIS/ABSCESS, TRUNK 02/22/2008  . LOW BACK PAIN 02/22/2008  . DIZZINESS 02/22/2008  . FATIGUE 02/22/2008  . PALPITATIONS 02/22/2008  . MURMUR, CARDIAC, UNDIAGNOSED 02/22/2008  . BREAST CANCER, HX OF 02/22/2008  . COUGH 01/05/2008  . DEPRESSION 07/05/2007  . UTERINE POLYP 07/05/2007  . FIBROMYALGIA 07/05/2007  . COLONIC POLYPS, HX OF 07/05/2007  . HX, PERSONAL, CERVICAL DYSPLASIA 07/05/2007    Guadelupe Sabin, OTR/L  [phone] 06/09/2016, 2:54 PM  [facility] [address] [place], [place], [address] Phone: [phone]   Fax:  [phone]  Name: Vilma Will  Lesh MRN: [id number] Date of Birth: June 10, 1961    OCCUPATIONAL THERAPY DISCHARGE  SUMMARY  Visits from Start of Care: 6  Current functional level related to goals / functional outcomes: See above. Pt reports she is able to complete ADL tasks independently, however continues to have pain.    Remaining deficits: Pt has pain with movement, however not as severe as on evaluation. Pt reports difficulty with range of motion when reaching across body (horizontal adduction) and behind back (internal rotation).    Education / Equipment: Pt provided with shoulder stretches to focus on shoulder flexion, horizontal adduction, internal and external rotation.  Plan: Patient agrees to discharge.  Patient goals were met. Patient is being discharged due to being pleased with the current functional level.  ?????

## 2016-06-09 NOTE — Patient Instructions (Signed)
  1) Flexion Wall Stretch    Face wall, place affected handon wall in front of you. Slide hand up the wall  and lean body in towards the wall. Hold for 5 seconds. Repeat 10 times. 1-2 times/day.     2) Towel Stretch with Internal Rotation   Gently pull up your affected arm  behind your back with the assist of a towel    3) Posterior Capsule Stretch    Bring the involved arm across chest. Grasp elbow and pull toward chest until you feel a stretch in the back of the upper arm and shoulder. Hold 5 seconds. Repeat 10X. Complete 1-2 times/day.      4) External Rotation Stretch:     Place your affected hand on the wall with the elbow bent and gently turn your body the opposite direction until a stretch is felt.

## 2016-08-05 ENCOUNTER — Ambulatory Visit (INDEPENDENT_AMBULATORY_CARE_PROVIDER_SITE_OTHER): Payer: BLUE CROSS/BLUE SHIELD

## 2016-08-05 ENCOUNTER — Ambulatory Visit (INDEPENDENT_AMBULATORY_CARE_PROVIDER_SITE_OTHER): Payer: BLUE CROSS/BLUE SHIELD | Admitting: Podiatry

## 2016-08-05 ENCOUNTER — Encounter: Payer: Self-pay | Admitting: Podiatry

## 2016-08-05 VITALS — BP 129/76 | HR 77 | Resp 16 | Ht 64.0 in | Wt 172.0 lb

## 2016-08-05 DIAGNOSIS — M79672 Pain in left foot: Secondary | ICD-10-CM

## 2016-08-05 DIAGNOSIS — M79671 Pain in right foot: Secondary | ICD-10-CM

## 2016-08-05 DIAGNOSIS — M722 Plantar fascial fibromatosis: Secondary | ICD-10-CM

## 2016-08-05 DIAGNOSIS — M205X1 Other deformities of toe(s) (acquired), right foot: Secondary | ICD-10-CM

## 2016-08-05 DIAGNOSIS — L600 Ingrowing nail: Secondary | ICD-10-CM | POA: Diagnosis not present

## 2016-08-05 MED ORDER — TRIAMCINOLONE ACETONIDE 10 MG/ML IJ SUSP
10.0000 mg | Freq: Once | INTRAMUSCULAR | Status: AC
  Administered 2016-08-05: 10 mg

## 2016-08-05 NOTE — Patient Instructions (Addendum)
Hallux Rigidus Hallux rigidus is a condition involving pain and a loss of motion of the first (big) toe. The pain gets worse with lifting up (extension) of the toe. This is usually due to arthritic bony bumps (spurring) of the joint at the base of the big toe.  SYMPTOMS   Pain, with lifting up of the toe.  Tenderness over the joint where the big toe meets the foot.  Redness, swelling, and warmth over the top of the base of the big toe (sometimes).  Foot pain, stiffness, and limping. CAUSES  Hallux rigidus is caused by arthritis of the joint where the big toe meets the foot. The arthritis creates a bone spur that pinches the soft tissues when the toe is extended. RISK INCREASES WITH:  Tight shoes with a narrow toe box.  Family history of foot problems.  Gout and rheumatoid and psoriatic arthritis.  History of previous toe injury, including "turf toe."  Long first toe, flat feet, and other big toe bony bumps.  Arthritis of the big toe. PREVENTION   Wear wide-toed shoes that fit well.  Tape the big toe to reduce motion and to prevent pinching of the tissues between the bone.  Maintain physical fitness:  Foot and ankle flexibility.  Muscle strength and endurance. PROGNOSIS  This condition can usually be managed with proper treatment. However, surgery is typically required to prevent the problem from recurring.  RELATED COMPLICATIONS  Injury to other areas of the foot or ankle, caused by abnormal walking in an attempt to avoid the pain felt when walking normally. TREATMENT Treatment first involves stopping the activities that aggravate your symptoms. Ice and medicine can be used to reduce the pain and inflammation. Modifications to shoes may help reduce pain, including wearing stiff-soled shoes, shoes with a wide toe box, inserting a padded donut to relieve pressure on top of the joint, or wearing an arch support. Corticosteroid injections may be given to reduce inflammation. If  nonsurgical treatment is unsuccessful, surgery may be needed. Surgical options include removing the arthritic bony spur, cutting a bone in the foot to change the arc of motion (allowing the toe to extend more), or fusion of the joint (eliminating all motion in the joint at the base of the big toe).  MEDICATION   If pain medicine is needed, nonsteroidal anti-inflammatory medicines (aspirin and ibuprofen), or other minor pain relievers (acetaminophen), are often advised.  Do not take pain medicine for 7 days before surgery.  Prescription pain relievers are usually prescribed only after surgery. Use only as directed and only as much as you need.  Ointments for arthritis, applied to the skin, may give some relief.  Injections of corticosteroids may be given to reduce inflammation. HEAT AND COLD  Cold treatment (icing) relieves pain and reduces inflammation. Cold treatment should be applied for 10 to 15 minutes every 2 to 3 hours, and immediately after activity that aggravates your symptoms. Use ice packs or an ice massage.  Heat treatment may be used before performing the stretching and strengthening activities prescribed by your caregiver, physical therapist, or athletic trainer. Use a heat pack or a warm water soak. SEEK MEDICAL CARE IF:   Symptoms get worse or do not improve in 2 weeks, despite treatment.  After surgery you develop fever, increasing pain, redness, swelling, drainage of fluids, bleeding, or increasing warmth.  New, unexplained symptoms develop. (Drugs used in treatment may produce side effects.)   This information is not intended to replace advice given to   you by your health care provider. Make sure you discuss any questions you have with your health care provider.   Document Released: 10/25/2005 Document Revised: 11/15/2014 Document Reviewed: 02/06/2009 Elsevier Interactive Patient Education 2016 [place] AFTER  PROCEDURE  Please follow the instructions your doctor has marked.   Shower as usual. Before getting out, place a drop of antibacterial liquid soap (Dial) on a wet, clean washcloth.  Gently wipe washcloth over affected area.  Afterward, rinse the area with warm water.  Blot the area dry with a soft cloth and cover with antibiotic ointment (neosporin, polysporin, bacitracin) and band aid or gauze and tape  Place 3-4 drops of antibacterial liquid soap in a quart of warm tap water.  Submerge foot into water for 20 minutes.  If bandage was applied after your procedure, leave on to allow for easy lift off, then remove and continue with soak for the remaining time.  Next, blot area dry with a soft cloth and cover with a bandage.  Apply other medications as directed by your doctor, such as cortisporin otic solution (eardrops) or neosporin antibiotic ointment

## 2016-08-05 NOTE — Progress Notes (Signed)
   Subjective:    Patient ID: [name], female    DOB: Apr 08, 1961, 55 y.o.   MRN: [id number]  HPI Chief Complaint  Patient presents with  . Nail Problem    bilateral great nails possible ingrown off and on for the past 2 yrs.    . Nail Problem    nail fungus?  Marland Kitchen Foot Pain    R heel pain x 6 mo.        Review of Systems  All other systems reviewed and are negative.      Objective:   Physical Exam        Assessment & Plan:

## 2016-08-05 NOTE — Progress Notes (Signed)
Patient ID: [name], female   DOB: 1960-11-15, 55 y.o.   MRN: [id number]

## 2016-08-06 NOTE — Progress Notes (Signed)
Subjective:     Patient ID: [name], female   DOB: [birth year], 55 y.o.   MRN: [id number]  HPI patient presents with severely painful nailbeds hallux bilateral with exquisite discomfort in the first metatarsal joint right of long-term duration and pain in the right plantar heel at the insertional point of the tendon into the calcaneus. States that the nails are becoming increasingly debilitating and the joint is getting sore in the heel has been more recent but is quite tender   Review of Systems  All other systems reviewed and are negative.      Objective:   Physical Exam  Constitutional: She is oriented to person, place, and time.  Cardiovascular: Intact distal pulses.   Musculoskeletal: Normal range of motion.  Neurological: She is oriented to person, place, and time.  Skin: Skin is warm.  Nursing note and vitals reviewed.  Neurovascular status found to be intact with muscle strength adequate range of motion within normal limits with patient noted to have exquisite discomfort plantar aspect right heel at the insertional point tendon into the calcaneus thickened nailbeds hallux bilateral with worst incurvation lateral right medial left that are painful when pressed along with reduced range of motion first MPJ right with crepitus within the joint and pain when palpated. 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 Today were to try to focus on the nails and the heel and I did discuss permanent removal of the entire nail but she wants to try to maintain it even though ultimately she may lose the entire nail. I infiltrated each hallux 60 mg like Marcaine mixture remove the lateral border right  medial border left applied phenol 3 applications 30 seconds followed by alcohol lavage and sterile dressing. I then went ahead and injected the plantar fascia right 3 mg Kenalog 5 mg Xylocaine and dispensed fascial brace with instructions on usage. Discussed the structural hallux limitus and she wants it corrected and I've recommended a biplanar osteotomy along with removal of spurs and she will reappoint for consult and is scheduled at the end of the month for procedure  X-ray report indicated dorsal spurring first metatarsal head right with mild narrowness of the joint surface

## 2016-08-12 ENCOUNTER — Ambulatory Visit (INDEPENDENT_AMBULATORY_CARE_PROVIDER_SITE_OTHER): Payer: BLUE CROSS/BLUE SHIELD | Admitting: Podiatry

## 2016-08-12 ENCOUNTER — Encounter: Payer: Self-pay | Admitting: Podiatry

## 2016-08-12 DIAGNOSIS — M205X1 Other deformities of toe(s) (acquired), right foot: Secondary | ICD-10-CM | POA: Diagnosis not present

## 2016-08-12 DIAGNOSIS — L600 Ingrowing nail: Secondary | ICD-10-CM

## 2016-08-12 NOTE — Patient Instructions (Signed)

## 2016-08-15 NOTE — Progress Notes (Signed)
Subjective:     Patient ID: [name], female   DOB: [birth year], 55 y.o.   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 I've recommended a biplanar-type osteotomy and I allowed her to read consent form for correction going over all possible alternative treatments and complications. I explained there is no guarantee as far as correction and that there could be cartilage damage and that ultimately she may require joint implantation or fusion procedure. Patient wants surgery understanding recovery can take her proximally 6 months and signs consent form and is also dispensed air fracture walker for postoperative period. Patient is encouraged to call with any further questions and will be seen for outpatient surgery in the next several weeks

## 2016-08-30 ENCOUNTER — Encounter (INDEPENDENT_AMBULATORY_CARE_PROVIDER_SITE_OTHER): Payer: Self-pay | Admitting: Orthopaedic Surgery

## 2016-08-30 ENCOUNTER — Ambulatory Visit (INDEPENDENT_AMBULATORY_CARE_PROVIDER_SITE_OTHER): Payer: BLUE CROSS/BLUE SHIELD | Admitting: Orthopaedic Surgery

## 2016-08-30 DIAGNOSIS — M25512 Pain in left shoulder: Secondary | ICD-10-CM

## 2016-08-30 DIAGNOSIS — G8929 Other chronic pain: Secondary | ICD-10-CM | POA: Diagnosis not present

## 2016-08-30 NOTE — Progress Notes (Signed)
Office Visit Note   Patient: [name]           Date of Birth: 09-Mar-1961           MRN: [id number] Visit Date: 08/30/2016              Requested by: Marton Redwood, MD [address] [place], [place] [address] PCP: Marton Redwood, MD   Assessment & Plan: Visit Diagnoses:  1. Chronic left shoulder pain     Plan: Will try Pennsaid for now on her left shoulder and see her in 4 weeks to place a subacromial steroid injection in her left shoulder  Follow-Up Instructions: Return in about 4 weeks (around 09/27/2016).   Orders:  No orders of the defined types were placed in this encounter.  No orders of the defined types were placed in this encounter.     Procedures: No procedures performed   Clinical Data: No additional findings.   Subjective: Chief Complaint  Patient presents with  . Left Shoulder - Pain    States that shoulder is still really bothering her. States ROM is becoming very painful.    HPI  Review of Systems   Objective: Vital Signs: There were no vitals taken for this visit.  Physical Exam  Constitutional: She is oriented to person, place, and time. She appears well-developed and well-nourished.  Musculoskeletal:       Left shoulder: She exhibits tenderness and bony tenderness.  Neurological: She is alert and oriented to person, place, and time.    Ortho Exam  Specialty Comments:  No specialty comments available.  Imaging: No results found.   PMFS History: Patient Active Problem List   Diagnosis Date Noted  . OSA on CPAP 04/01/2015  . Subacute confusional state 04/01/2015  . Hallucinations, visual 04/01/2015  . Hypnapompic hallucinations 04/01/2015  . Sleep apnea with use of continuous positive airway pressure (CPAP)   . Hypersomnia, persistent 03/30/2013  . Obstructive sleep apnea (adult) (pediatric) 03/30/2013  . CONSTIPATION 11/18/2008  . RECTAL BLEEDING 11/18/2008  . HEMORRHOIDS, INTERNAL 11/15/2008  . DIVERTICULOSIS,  COLON 11/15/2008  . CELLULITIS/ABSCESS, TRUNK 02/22/2008  . LOW BACK PAIN 02/22/2008  . DIZZINESS 02/22/2008  . FATIGUE 02/22/2008  . PALPITATIONS 02/22/2008  . MURMUR, CARDIAC, UNDIAGNOSED 02/22/2008  . BREAST CANCER, HX OF 02/22/2008  . COUGH 01/05/2008  . DEPRESSION 07/05/2007  . UTERINE POLYP 07/05/2007  . FIBROMYALGIA 07/05/2007  . COLONIC POLYPS, HX OF 07/05/2007  . HX, PERSONAL, CERVICAL DYSPLASIA 07/05/2007   Past Medical History:  Diagnosis Date  . Anxiety   . Breast cancer ([place])    rt breast  and left breast breast removed  . Breast reconstruction deformity   . Complication of anesthesia    shaking-after mastectomies-resp and hr dropped  . Depression, major    suicidal thoughts -implants, mental health, 05/11  . Fibromyalgia   . Headache(784.0)   . Hypersomnia    sleep attacks  . Hypothyroidism   . Migraine headache   . Narcolepsy   . Neoplasm    malignant breast, history of status post bilateral  . Post-operative nausea and vomiting   . Sleep apnea    CPAP  . Sleep apnea with use of continuous positive airway pressure (CPAP)    AHI 20 in HST, titration to 6 cm water - residual AHi 6 . remaining hypersomnic.     Family History  Problem Relation Age of Onset  . Cancer Mother     breast  . Cancer Sister  breast  . Cancer Maternal Aunt     breast  . Colon cancer Maternal Grandmother     age unknown, ? 71's  . Colon cancer Cousin     Past Surgical History:  Procedure Laterality Date  . Breast reconstructioin  7/09   flaps-bilat mastectomies  . BTL    . CARPAL TUNNEL RELEASE  2011   both hands  . CARPOMETACARPEL SUSPENSION PLASTY Right 08/29/2014   Procedure: REMOVAL DERMASPAN SUSPENSIONPLASTY RIGHT THUMB ABDUCTOR POLLIS TRANSFER;  Surgeon: Daryll Brod, MD;  Location: [place];  Service: Orthopedics;  Laterality: Right;  . COLONOSCOPY W/ POLYPECTOMY  4/03   tubulovillous adenoma  . FINGER ARTHROSCOPY WITH CARPOMETACARPEL [facility])  ARTHROPLASTY Right 06/20/2014   Procedure: ARTHROSCOPY RIGHT THUMB CARPOMETACARPEL GRAFT JACKET INTERPOSITION PARTIAL TRAPEZIECTOMY POSSIBLE OPEN;  Surgeon: Daryll Brod, MD;  Location: [place];  Service: Orthopedics;  Laterality: Right;  . GANGLION CYST EXCISION     both [phone]  . hysterestomy  2005  . MASTECTOMY Bilateral 2008  . post- a -cath  2008  . TRIGGER FINGER RELEASE Right 08/29/2014   Procedure: RELEASE TRIGGER FINGER/A-1 PULLEY;  Surgeon: Daryll Brod, MD;  Location: [place];  Service: Orthopedics;  Laterality: Right;  . wisdom teeth out     Social History   Occupational History  . disabled     Currently in [place] at  [employer] Topics  . Smoking status: Current Every Day Smoker    Packs/day: 0.25    Types: Cigarettes    Last attempt to quit: 06/14/2012  . Smokeless tobacco: Never Used     Comment: quit smoking in 1999  . Alcohol use Yes     Comment: glass wine or beer "every once in a while" per pt.  . Drug use: No  . Sexual activity: Not on file

## 2016-08-31 ENCOUNTER — Encounter: Payer: Self-pay | Admitting: Podiatry

## 2016-08-31 DIAGNOSIS — M2011 Hallux valgus (acquired), right foot: Secondary | ICD-10-CM | POA: Diagnosis not present

## 2016-09-03 NOTE — Progress Notes (Signed)
DOS 10.24.2017 Bi-Planar Osteotomy with Fixation Right 1st Metatarsal

## 2016-09-08 ENCOUNTER — Ambulatory Visit (INDEPENDENT_AMBULATORY_CARE_PROVIDER_SITE_OTHER): Payer: BLUE CROSS/BLUE SHIELD | Admitting: Podiatry

## 2016-09-08 ENCOUNTER — Ambulatory Visit (INDEPENDENT_AMBULATORY_CARE_PROVIDER_SITE_OTHER): Payer: BLUE CROSS/BLUE SHIELD

## 2016-09-08 DIAGNOSIS — Z9889 Other specified postprocedural states: Secondary | ICD-10-CM | POA: Diagnosis not present

## 2016-09-08 DIAGNOSIS — M722 Plantar fascial fibromatosis: Secondary | ICD-10-CM | POA: Diagnosis not present

## 2016-09-08 MED ORDER — OXYCODONE-ACETAMINOPHEN 10-325 MG PO TABS: 1.0000 | ORAL_TABLET | Freq: Three times a day (TID) | ORAL | 0 refills | Status: DC | PRN

## 2016-09-12 NOTE — Progress Notes (Addendum)
Subjective: Patient presents today status post right bunionectomy 1 week. Date of surgery 08/31/2016. Patient states that she's doing well. Today x-rays were taken.  Objective: Incision site is well coapted with sutures intact. No sign of cellulitic or infectious changes. Moderate edema.  Assessment: Status post bunionectomy right foot 1 week  Plan of care: Today dressings were changed. X-rays were reviewed. Orthopedic hardware intact. Continue to keep the foot clean and dry. Today a postoperative shoe was dispensed. Recommend a lower extremity shower back. Refill for pain medication was placed today. Patient is to return to clinic in 1 week.

## 2016-09-15 ENCOUNTER — Ambulatory Visit (INDEPENDENT_AMBULATORY_CARE_PROVIDER_SITE_OTHER): Payer: BLUE CROSS/BLUE SHIELD | Admitting: Physician Assistant

## 2016-09-15 ENCOUNTER — Encounter (INDEPENDENT_AMBULATORY_CARE_PROVIDER_SITE_OTHER): Payer: Self-pay | Admitting: Physician Assistant

## 2016-09-15 ENCOUNTER — Ambulatory Visit: Payer: Self-pay | Admitting: Podiatry

## 2016-09-15 ENCOUNTER — Ambulatory Visit: Payer: BLUE CROSS/BLUE SHIELD

## 2016-09-15 DIAGNOSIS — B351 Tinea unguium: Secondary | ICD-10-CM

## 2016-09-15 DIAGNOSIS — Z9889 Other specified postprocedural states: Secondary | ICD-10-CM

## 2016-09-15 DIAGNOSIS — G8929 Other chronic pain: Secondary | ICD-10-CM

## 2016-09-15 DIAGNOSIS — M205X1 Other deformities of toe(s) (acquired), right foot: Secondary | ICD-10-CM | POA: Diagnosis not present

## 2016-09-15 DIAGNOSIS — M25512 Pain in left shoulder: Secondary | ICD-10-CM | POA: Diagnosis not present

## 2016-09-15 MED ORDER — METHYLPREDNISOLONE ACETATE 40 MG/ML IJ SUSP
40.0000 mg | INTRAMUSCULAR | Status: AC | PRN
  Administered 2016-09-15: 40 mg via INTRA_ARTICULAR

## 2016-09-15 MED ORDER — LIDOCAINE HCL 1 % IJ SOLN
1.0000 mL | INTRAMUSCULAR | Status: AC | PRN
  Administered 2016-09-15: 1 mL

## 2016-09-15 NOTE — Progress Notes (Signed)
Office Visit Note   Patient: [name]           Date of Birth: 1961/10/23           MRN: [id number] Visit Date: 09/15/2016              Requested by: Marton Redwood, MD [address] [place], [place] [address] PCP: Marton Redwood, MD   Assessment & Plan: Visit Diagnoses: No diagnosis found.  Plan: Forward flexion exercises, wall crawls, pendulum and Codman exercises.  Follow-Up Instructions: Return in about 4 weeks (around 10/13/2016).   Orders:  Orders Placed This Encounter  Procedures  . Large Joint Injection/Arthrocentesis   No orders of the defined types were placed in this encounter.     Procedures: Large Joint Inj Date/Time: 09/15/2016 3:20 PM Performed by: Pete Pelt Authorized by: Pete Pelt   Consent Given by:  Patient Indications:  Pain Location:  Shoulder Needle Size:  22 G Needle Length:  1.5 inches Approach:  Anterolateral Ultrasound Guidance: No   Fluoroscopic Guidance: No   Arthrogram: No Medications:  1 mL lidocaine 1 %; 40 mg methylPREDNISolone acetate 40 MG/ML Aspiration Attempted: No   Patient tolerance:  Patient tolerated the procedure well with no immediate complications       Clinical Data: No additional findings.   Subjective: Chief Complaint  Patient presents with  . Left Shoulder - Follow-up    S/p Left shoulder scope, debridment, SAD on 03/04/2016    Ms. Manter is approximately 5 and a half months out from Left shoulder scope, debridement, SAD.  She states that it is worse than before surgery. States that it pops when she moves it in certain positions. She states that she is interested in getting the injection for her shoulder today.    Review of Systems   Objective: Vital Signs: There were no vitals taken for this visit.  Physical Exam  Ortho Exam 5 Out of 5 strength of external/internal rotation bilateral shoulders against resistance. Left shoulder Active forward flexion 130 passive forward  flexion 170. Left shoulder positive impingement. Specialty Comments:  No specialty comments available.  Imaging: No results found.   PMFS History: Patient Active Problem List   Diagnosis Date Noted  . OSA on CPAP 04/01/2015  . Subacute confusional state 04/01/2015  . Hallucinations, visual 04/01/2015  . Hypnapompic hallucinations 04/01/2015  . Sleep apnea with use of continuous positive airway pressure (CPAP)   . Hypersomnia, persistent 03/30/2013  . Obstructive sleep apnea (adult) (pediatric) 03/30/2013  . CONSTIPATION 11/18/2008  . RECTAL BLEEDING 11/18/2008  . HEMORRHOIDS, INTERNAL 11/15/2008  . DIVERTICULOSIS, COLON 11/15/2008  . CELLULITIS/ABSCESS, TRUNK 02/22/2008  . LOW BACK PAIN 02/22/2008  . DIZZINESS 02/22/2008  . FATIGUE 02/22/2008  . PALPITATIONS 02/22/2008  . MURMUR, CARDIAC, UNDIAGNOSED 02/22/2008  . BREAST CANCER, HX OF 02/22/2008  . COUGH 01/05/2008  . DEPRESSION 07/05/2007  . UTERINE POLYP 07/05/2007  . FIBROMYALGIA 07/05/2007  . COLONIC POLYPS, HX OF 07/05/2007  . HX, PERSONAL, CERVICAL DYSPLASIA 07/05/2007   Past Medical History:  Diagnosis Date  . Anxiety   . Breast cancer ([place])    rt breast  and left breast breast removed  . Breast reconstruction deformity   . Complication of anesthesia    shaking-after mastectomies-resp and hr dropped  . Depression, major    suicidal thoughts -implants, mental health, 05/11  . Fibromyalgia   . Headache(784.0)   . Hypersomnia    sleep attacks  . Hypothyroidism   .  Migraine headache   . Narcolepsy   . Neoplasm    malignant breast, history of status post bilateral  . Post-operative nausea and vomiting   . Sleep apnea    CPAP  . Sleep apnea with use of continuous positive airway pressure (CPAP)    AHI 20 in HST, titration to 6 cm water - residual AHi 6 . remaining hypersomnic.     Family History  Problem Relation Age of Onset  . Cancer Mother     breast  . Cancer Sister     breast  . Cancer  Maternal Aunt     breast  . Colon cancer Maternal Grandmother     age unknown, ? 73's  . Colon cancer Cousin     Past Surgical History:  Procedure Laterality Date  . Breast reconstructioin  7/09   flaps-bilat mastectomies  . BTL    . CARPAL TUNNEL RELEASE  2011   both hands  . CARPOMETACARPEL SUSPENSION PLASTY Right 08/29/2014   Procedure: REMOVAL DERMASPAN SUSPENSIONPLASTY RIGHT THUMB ABDUCTOR POLLIS TRANSFER;  Surgeon: Daryll Brod, MD;  Location: [place] [facility];  Service: Orthopedics;  Laterality: Right;  . COLONOSCOPY W/ POLYPECTOMY  4/03   tubulovillous adenoma  . FINGER ARTHROSCOPY WITH CARPOMETACARPEL [facility]) ARTHROPLASTY Right 06/20/2014   Procedure: ARTHROSCOPY RIGHT THUMB CARPOMETACARPEL GRAFT JACKET INTERPOSITION PARTIAL TRAPEZIECTOMY POSSIBLE OPEN;  Surgeon: Daryll Brod, MD;  Location: [place];  Service: Orthopedics;  Laterality: Right;  . GANGLION CYST EXCISION     both [phone]  . hysterestomy  2005  . MASTECTOMY Bilateral 2008  . post- a -cath  2008  . TRIGGER FINGER RELEASE Right 08/29/2014   Procedure: RELEASE TRIGGER FINGER/A-1 PULLEY;  Surgeon: Daryll Brod, MD;  Location: [place];  Service: Orthopedics;  Laterality: Right;  . wisdom teeth out     Social History   Occupational History  . disabled     Currently in [place] at  [place] Topics  . Smoking status: Current Every Day Smoker    Packs/day: 0.25    Types: Cigarettes    Last attempt to quit: 06/14/2012  . Smokeless tobacco: Never Used     Comment: quit smoking in 1999  . Alcohol use Yes     Comment: glass wine or beer "every once in a while" per pt.  . Drug use: No  . Sexual activity: Not on file

## 2016-09-16 NOTE — Progress Notes (Signed)
Subjective:     Patient ID: [name], female   DOB: Jun 17, 1961, 55 y.o.   MRN: [id number]  HPI patient presents stating my right foot has been doing better but it is sore if I walk a lot and will swell mildly   Review of Systems     Objective:   Physical Exam Neurovascular status intact negative Homan sign was noted with well coapted incision site right first metatarsal with good range of motion with approximate 30 of dorsiflexion 20 plantarflexion no pain no crepitus within the joint surface    Assessment:     Doing well post osteotomy first metatarsal right with good alignment noted    Plan:     Reviewed condition and gradual increase in activity with continued compression immobilization elevation and may begin wearing surgical shoe at this time and encouraged range of motion exercises. Reappoint in 2 weeks or earlier if any issues should occur

## 2016-09-27 ENCOUNTER — Encounter (INDEPENDENT_AMBULATORY_CARE_PROVIDER_SITE_OTHER): Payer: Self-pay

## 2016-09-27 ENCOUNTER — Ambulatory Visit (INDEPENDENT_AMBULATORY_CARE_PROVIDER_SITE_OTHER): Payer: BLUE CROSS/BLUE SHIELD | Admitting: Orthopaedic Surgery

## 2016-10-06 ENCOUNTER — Ambulatory Visit (INDEPENDENT_AMBULATORY_CARE_PROVIDER_SITE_OTHER): Payer: BLUE CROSS/BLUE SHIELD

## 2016-10-06 ENCOUNTER — Ambulatory Visit (INDEPENDENT_AMBULATORY_CARE_PROVIDER_SITE_OTHER): Payer: BLUE CROSS/BLUE SHIELD | Admitting: Podiatry

## 2016-10-06 ENCOUNTER — Encounter: Payer: Self-pay | Admitting: Podiatry

## 2016-10-06 DIAGNOSIS — M205X1 Other deformities of toe(s) (acquired), right foot: Secondary | ICD-10-CM

## 2016-10-06 DIAGNOSIS — Z9889 Other specified postprocedural states: Secondary | ICD-10-CM

## 2016-10-07 NOTE — Progress Notes (Signed)
Subjective:     Patient ID: [name], female   DOB: [birth year], 55 y.o.   MRN: [id number]  HPI patient presents stating her right foot feels good but it has had some swelling in that she admits she's not been compliant has been going barefoot at home and has return to shoe over the last 10 days. States that the swelling seems to have gotten moderately worse and she's been more active   Review of Systems     Objective:   Physical Exam Neurovascular status intact negative Homans sign was noted with excellent range of motion first MPJ with approximate 30 dorsiflexion 20 plantar flexion with no crepitus and mild to moderate edema around the first MPJ right    Assessment:     Patient is been noncompliant in returning to shoes premature to what I had wanted but has good range of motion with mild to moderate increase in edema    Plan:     X-rays reviewed with patient and advised this patient that she needs to immobilize for the next few weeks that there is been slight movement of the first metatarsal head but I do think it'll be stable at this position but I need to watch it. She will utilize immobilization compression elevation and be seen back again in 2 weeks  X-ray indicates there is been moderate compression of the osteotomy right first metatarsal slight elevation but I do think it'll heal uneventfully this position but I need to be careful with watching

## 2016-10-13 ENCOUNTER — Ambulatory Visit (INDEPENDENT_AMBULATORY_CARE_PROVIDER_SITE_OTHER): Payer: BLUE CROSS/BLUE SHIELD | Admitting: Orthopaedic Surgery

## 2016-10-13 DIAGNOSIS — G8929 Other chronic pain: Secondary | ICD-10-CM | POA: Diagnosis not present

## 2016-10-13 DIAGNOSIS — M25512 Pain in left shoulder: Secondary | ICD-10-CM | POA: Diagnosis not present

## 2016-10-13 NOTE — Progress Notes (Signed)
[name] is well-known to me. We performed an arthroscopic intervention of her left shoulder and April of this year. She has moderate glenohumeral arthritis and she had significant synovitis in her shoulder as well as tightness to the shoulder and the capsule. We performed a subacromial decompression and debridement of the glenohumeral joint. She and 10 use to be plagued by chronic pain in her left shoulder. Her motion she says is definitely better but the pain is quite severe. She's been on chronic pain medications including naproxen and Percocet for this. She's had a series of injections in her shoulder since surgery and this is not helped for one that. She is very frustrated this point.  Objective on examination her shoulder range of motion is almost full with clicking in the posterior aspect of her shoulder mainly at the glenohumeral joint. This elicits a decent pain response.  At this point options would be a repeat arthroscopy of her left shoulder versus referral for a shoulder replacement. She's definitely not interested in a shoulder replacement and she would like to consider repeat arthroscopy if there is a chance this may help decrease her pain. I think is deathly worth trying something like this given the failure of all of treatment methods. Her pain is 10 out of 10 is bothering quite a bit. There is no sign infection.  Work on setting her up for a repeat left shoulder arthroscopy and we'll see her back in one week postoperative for suture removal with hopefully getting her shoulder moving and feeling better.

## 2016-10-20 ENCOUNTER — Ambulatory Visit (INDEPENDENT_AMBULATORY_CARE_PROVIDER_SITE_OTHER): Payer: BLUE CROSS/BLUE SHIELD | Admitting: Podiatry

## 2016-10-20 ENCOUNTER — Ambulatory Visit (INDEPENDENT_AMBULATORY_CARE_PROVIDER_SITE_OTHER): Payer: BLUE CROSS/BLUE SHIELD

## 2016-10-20 DIAGNOSIS — M205X1 Other deformities of toe(s) (acquired), right foot: Secondary | ICD-10-CM

## 2016-10-20 DIAGNOSIS — S92301D Fracture of unspecified metatarsal bone(s), right foot, subsequent encounter for fracture with routine healing: Secondary | ICD-10-CM

## 2016-10-20 NOTE — Progress Notes (Signed)
Subjective:     Patient ID: [name], female   DOB: [birth year], 55 y.o.   MRN: [id number]  HPI patient states that she's not having pain around her big toe joint with mild swelling but she does have some numbness around her second toe that she was concerned about patient states that she has not been doing what she was doing before and walking barefoot with no immobilization and is wearing her boot or surgical shoe   Review of Systems     Objective:   Physical Exam Neurovascular status intact muscle strength adequate range motion within normal limits with mild edema around the first MPJ right with good range of motion with approximate 2530 dorsiflexion 20 plantar flexion with no crepitus of the joint surface    Assessment:     Traumatized right first metatarsal head secondary to noncompliance during the postoperative period with possible numbness second toe which may be related to edema but does not appear to be anything pathological    Plan:     H&P and x-ray reviewed. I discussed with patient that it's moved slightly more than previous visit and his precautionary measure I'm placing her in a shoe that we'll allow no weight on the forefoot. I do think she's getting need this for a period of time and I did explain in the and it is possible that she will need surgery on this to reposition it but I do think that heels at this position and remains is good function the as it is now that it should not be a problem long-term. I did explain to her though the importance of not walking on this and to being compliant and the fact it is still possible that it may require revisional surgery in the future  X-ray report indicates there is been slight increase in the dorsal excursion of the capital fragment first metatarsal but relatively a small amount. I reviewed the case with Dr. Amalia Hailey who agrees on the immobilization element of this at this time

## 2016-11-03 ENCOUNTER — Ambulatory Visit (INDEPENDENT_AMBULATORY_CARE_PROVIDER_SITE_OTHER): Payer: BLUE CROSS/BLUE SHIELD

## 2016-11-03 ENCOUNTER — Encounter: Payer: Self-pay | Admitting: Podiatry

## 2016-11-03 ENCOUNTER — Ambulatory Visit (INDEPENDENT_AMBULATORY_CARE_PROVIDER_SITE_OTHER): Payer: Self-pay | Admitting: Podiatry

## 2016-11-03 DIAGNOSIS — M2011 Hallux valgus (acquired), right foot: Secondary | ICD-10-CM

## 2016-11-04 ENCOUNTER — Encounter: Payer: Self-pay | Admitting: Orthopaedic Surgery

## 2016-11-04 DIAGNOSIS — M25512 Pain in left shoulder: Secondary | ICD-10-CM | POA: Diagnosis not present

## 2016-11-04 DIAGNOSIS — G8918 Other acute postprocedural pain: Secondary | ICD-10-CM | POA: Diagnosis not present

## 2016-11-04 NOTE — Progress Notes (Signed)
Subjective:     Patient ID: [name], female   DOB: [birth year], 55 y.o.   MRN: [id number]  HPI patient states that she's been wearing her surgical shoe and states that she's been having minimal discomfort in the joint with mild swelling and she is due to have a   Review of Systems     Objective:   Physical Exam Neurovascular status intact negative Homans sign noted with patient's first MPJ showing good healing with good range of motion both dorsal and plantar flexion with no crepitus of the joint noted    Assessment:     Patient had been previously noncompliant and developed a fracture of the osteotomy but it appears to be healing stable with no clinical indication of pathology    Plan:     H&P x-rays reviewed. I do not see further movement of the osteotomy and I do think hopefully it'll heal at this position but we will continue immobilization for the next 3 weeks and reevaluate. I did explain to her that it's possible for long term arthritis and it's possible this will require revisional surgery at one point in the future  X-ray report indicates that there has been movement of the dorsal portion of the osteotomy site but it is stable when compared to previous x-ray

## 2016-11-11 ENCOUNTER — Ambulatory Visit (INDEPENDENT_AMBULATORY_CARE_PROVIDER_SITE_OTHER): Payer: BLUE CROSS/BLUE SHIELD | Admitting: Orthopaedic Surgery

## 2016-11-11 DIAGNOSIS — M19012 Primary osteoarthritis, left shoulder: Secondary | ICD-10-CM

## 2016-11-11 NOTE — Progress Notes (Signed)
The patient is on weeks status post a left shoulder arthroscopy. This is a repeat arthroscopy. We performed minimal debridement and found some inflamed tissue but more significantly worsening glenohumeral arthritis. She is having significant pain postoperative.  Incision lines look good remove sutures. His notes infection. She Really good range of motion but is deathly painful. We did go over arthroscopy pictures to show her the extent of her glenohumeral arthritis.  At this point I would like to send her to Dr. Ernestina Patches for an intra-articular steroid injection in the left shoulder glenohumeral joint. I'll see her back myself in 4 weeks to see how she is done overall.

## 2016-11-15 ENCOUNTER — Other Ambulatory Visit (INDEPENDENT_AMBULATORY_CARE_PROVIDER_SITE_OTHER): Payer: Self-pay

## 2016-11-15 DIAGNOSIS — M25512 Pain in left shoulder: Secondary | ICD-10-CM

## 2016-11-26 ENCOUNTER — Ambulatory Visit: Payer: BLUE CROSS/BLUE SHIELD

## 2016-11-29 ENCOUNTER — Ambulatory Visit (INDEPENDENT_AMBULATORY_CARE_PROVIDER_SITE_OTHER): Payer: Self-pay | Admitting: Podiatry

## 2016-11-29 ENCOUNTER — Ambulatory Visit (INDEPENDENT_AMBULATORY_CARE_PROVIDER_SITE_OTHER): Payer: BLUE CROSS/BLUE SHIELD

## 2016-11-29 DIAGNOSIS — M205X1 Other deformities of toe(s) (acquired), right foot: Secondary | ICD-10-CM | POA: Diagnosis not present

## 2016-11-29 DIAGNOSIS — M2011 Hallux valgus (acquired), right foot: Secondary | ICD-10-CM | POA: Diagnosis not present

## 2016-11-29 DIAGNOSIS — M1000 Idiopathic gout, unspecified site: Secondary | ICD-10-CM

## 2016-11-30 NOTE — Progress Notes (Signed)
Subjective:     Patient ID: [name], female   DOB: 1960/12/01, 57 y.o.   MRN: [id number]  HPI patient presents stating that her big toe joints been doing pretty well with still some swelling but overall functioning but she's developed swelling in her third toe that she wanted looked at   Review of Systems     Objective:   Physical Exam Neurovascular status unchanged with patient having been noncompliant and past and developed fracture of the first metatarsal head right secondary to surgery. Range of motion is good with no crepitus of the joint and she does have 30 dorsiflexion 20 plantar flexion with no pain when I palpated the area and I did note swelling of the distal interphalangeal joint digit 3 right    Assessment:     This may be related to an arthritic issue or could even be gout she's had a possible history in the past versus other conditions and appears to be doing well overall first MPJ with minimal discomfort upon palpation    Plan:     I reviewed condition and at this point were in a take a wait-and-see attitude as it's not sore just swollen. She will utilize soaks and she will come back if it should turn red or become painful on the third toe and a sparse the big toe joint it is stable and I'm hoping will heal this position even though she understands ultimately could require surgery  X-ray report indicates the dorsal segment of the first metatarsal head has lifted secondary to previous noncompliance but it does appear to be stable at this area and should heal and hopefully will be clinically uneventful

## 2016-12-08 ENCOUNTER — Telehealth (INDEPENDENT_AMBULATORY_CARE_PROVIDER_SITE_OTHER): Payer: Self-pay | Admitting: *Deleted

## 2016-12-08 NOTE — Telephone Encounter (Signed)
She needs to be referred to Dr. Malena Catholic at [facility] to evaluate her for a shoulder replacement.

## 2016-12-08 NOTE — Telephone Encounter (Signed)
Pt called stating she was calling to see who the shoulder specialist was that Dr. Ninfa Linden was referring her to. She does not want the injections

## 2016-12-08 NOTE — Telephone Encounter (Signed)
Please advise 

## 2016-12-09 ENCOUNTER — Ambulatory Visit (INDEPENDENT_AMBULATORY_CARE_PROVIDER_SITE_OTHER): Payer: BLUE CROSS/BLUE SHIELD | Admitting: Orthopaedic Surgery

## 2016-12-09 ENCOUNTER — Other Ambulatory Visit (INDEPENDENT_AMBULATORY_CARE_PROVIDER_SITE_OTHER): Payer: Self-pay

## 2016-12-09 DIAGNOSIS — G8929 Other chronic pain: Secondary | ICD-10-CM

## 2016-12-09 DIAGNOSIS — M25512 Pain in left shoulder: Principal | ICD-10-CM

## 2016-12-09 NOTE — Telephone Encounter (Signed)
Patient aware we sent referral to Dr. Rodman Key office

## 2016-12-15 ENCOUNTER — Telehealth (INDEPENDENT_AMBULATORY_CARE_PROVIDER_SITE_OTHER): Payer: Self-pay | Admitting: *Deleted

## 2016-12-15 ENCOUNTER — Telehealth (INDEPENDENT_AMBULATORY_CARE_PROVIDER_SITE_OTHER): Payer: Self-pay | Admitting: Orthopaedic Surgery

## 2016-12-15 NOTE — Telephone Encounter (Signed)
Patient called in this morning in regards to needing a copy of all of her X-rays today if possible please. Her CB # [phone]. Thank you

## 2016-12-15 NOTE — Telephone Encounter (Signed)
PATIENT CALLED, NEEDS THE LAST FEW OFFICE NOTES FOR HER DISABILITY Co.  I VERIFIED HER DOB AND SSN.  COPY OF LAST OV NOTES MAILED TO HER PER HER REQUEST.

## 2016-12-15 NOTE — Telephone Encounter (Signed)
Done.  CD is made and placed up front. Patient is aware.

## 2016-12-22 ENCOUNTER — Other Ambulatory Visit: Payer: Self-pay | Admitting: Orthopedic Surgery

## 2016-12-22 ENCOUNTER — Ambulatory Visit (INDEPENDENT_AMBULATORY_CARE_PROVIDER_SITE_OTHER): Payer: BLUE CROSS/BLUE SHIELD | Admitting: Podiatry

## 2016-12-22 ENCOUNTER — Ambulatory Visit (INDEPENDENT_AMBULATORY_CARE_PROVIDER_SITE_OTHER): Payer: BLUE CROSS/BLUE SHIELD

## 2016-12-22 DIAGNOSIS — M205X1 Other deformities of toe(s) (acquired), right foot: Secondary | ICD-10-CM

## 2016-12-22 DIAGNOSIS — R600 Localized edema: Secondary | ICD-10-CM

## 2016-12-22 DIAGNOSIS — S92301D Fracture of unspecified metatarsal bone(s), right foot, subsequent encounter for fracture with routine healing: Secondary | ICD-10-CM

## 2016-12-22 DIAGNOSIS — M2011 Hallux valgus (acquired), right foot: Secondary | ICD-10-CM

## 2016-12-22 NOTE — Progress Notes (Signed)
Subjective:     Patient ID: [name], female   DOB: 1961/06/29, 56 y.o.   MRN: [id number]  HPI patient presents stating that she's not having pain currently and her big toe joint but she's not happy with the position of her second toe and it feels strange to her and she has some swelling in her third toe that's been present for a while so continues to have swelling in her forefoot that she's concerned about. She also states that she is having severe pain in her left shoulder and is already had 2 surgeries and is having shoulder replacement on March 8   Review of Systems     Objective:   Physical Exam Neurovascular status is found to be intact with negative Homans sign noted. First MPJ range of motion indicated 30 of dorsiflexion 30 of plantarflexion with no crepitus or pain when I move the joint surface. I checked the tendon strength of the second digit and found to be functional both in a plantar and dorsal direction and the toe position appears to be unchanged from previous. Patient's third digit does have swelling at the distal interphalangeal joint that has been present for a long time and is nonpainful    Assessment:     Noncompliant patient who had walked without any form of immobilization and fracture the head of her first metatarsal post surgery. Mild forefoot edema which is consistent with the fracture process with good function at the current time of the joint first MPJ    Plan:     Patient who experienced an unfortunate fracture of the head of her first metatarsal due to noncompliance and walking without any form of immobilization post surgery. The dorsal arm of the first metatarsal head has lifted and there still is gapping noted but I do not note any significant further elevation from previous visit. There has been some slight shortening of the first metatarsal during healing process and there is forefoot edema which should hopefully resolve over time. I did review with her  at great length the possibility that we may need to go back in and relocate the first metatarsal but currently she has good motion that's pain-free. 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 She is not complaining of pain just a strange feeling in the digit I did discuss with her the possibility for long-term surgery but it is of far more importance to her the severe shoulder pain she is having and having her replacement done on March 8 as plain I did go ahead today and placed on anti-inflammatory and instructed on heat ice therapy for the dorsum of the right foot along with continued rigid bottom shoes and that we will reevaluate again in 2 months after she's had her shoulder replacement with again the possibility for surgery of which she is completely aware. I am hopeful that this will heal uneventfully over time and since there's been no further movement of the dorsal osteotomy I do think we can watch this and allow her to proceed with her shoulder surgery which she has chosen to do at this time  X-ray report indicates there has been a fracture of the first metatarsal head secondary to noncompliance with continued gapping of the dorsal osteotomy that is stable at this position but has not yet shown indications of healing we will reevaluate in 8 weeks and may require further surgery or possible  bone stimulator depending on how it does over that period of time

## 2017-01-07 ENCOUNTER — Encounter: Payer: Self-pay | Admitting: Internal Medicine

## 2017-01-07 ENCOUNTER — Encounter (HOSPITAL_COMMUNITY): Payer: Self-pay

## 2017-01-07 ENCOUNTER — Encounter (HOSPITAL_COMMUNITY)
Admission: RE | Admit: 2017-01-07 | Discharge: 2017-01-07 | Disposition: A | Discharge status: Home / Self Care | Payer: BLUE CROSS/BLUE SHIELD | Source: Ambulatory Visit | Attending: Orthopedic Surgery | Admitting: Orthopedic Surgery

## 2017-01-07 ENCOUNTER — Ambulatory Visit (HOSPITAL_COMMUNITY)
Admission: RE | Admit: 2017-01-07 | Discharge: 2017-01-07 | Disposition: A | Discharge status: Home / Self Care | Payer: BLUE CROSS/BLUE SHIELD | Source: Ambulatory Visit | Attending: Orthopedic Surgery | Admitting: Orthopedic Surgery

## 2017-01-07 DIAGNOSIS — Z01810 Encounter for preprocedural cardiovascular examination: Secondary | ICD-10-CM | POA: Insufficient documentation

## 2017-01-07 DIAGNOSIS — Z01818 Encounter for other preprocedural examination: Secondary | ICD-10-CM | POA: Insufficient documentation

## 2017-01-07 DIAGNOSIS — M19012 Primary osteoarthritis, left shoulder: Secondary | ICD-10-CM | POA: Diagnosis not present

## 2017-01-07 DIAGNOSIS — R9431 Abnormal electrocardiogram [ECG] [EKG]: Secondary | ICD-10-CM | POA: Insufficient documentation

## 2017-01-07 DIAGNOSIS — Z01812 Encounter for preprocedural laboratory examination: Secondary | ICD-10-CM | POA: Diagnosis present

## 2017-01-07 HISTORY — DX: Unspecified osteoarthritis, unspecified site: M19.90

## 2017-01-07 LAB — COMPREHENSIVE METABOLIC PANEL
ALK PHOS: 72 U/L (ref 38–126)
ALT: 25 U/L (ref 14–54)
ANION GAP: 8 (ref 5–15)
AST: 26 U/L (ref 15–41)
Albumin: 4.1 g/dL (ref 3.5–5.0)
BUN: 12 mg/dL (ref 6–20)
CALCIUM: 9.5 mg/dL (ref 8.9–10.3)
CO2: 27 mmol/L (ref 22–32)
Chloride: 105 mmol/L (ref 101–111)
Creatinine, Ser: 0.84 mg/dL (ref 0.44–1.00)
GFR calc non Af Amer: >60 mL/min (ref >60)
GLUCOSE: 110 mg/dL — AB (ref 65–99)
Potassium: 4 mmol/L (ref 3.5–5.1)
Sodium: 140 mmol/L (ref 135–145)
Total Bilirubin: 0.4 mg/dL (ref 0.3–1.2)
Total Protein: 6.8 g/dL (ref 6.5–8.1)

## 2017-01-07 LAB — URINALYSIS, ROUTINE W REFLEX MICROSCOPIC
BILIRUBIN URINE: NEGATIVE
Glucose, UA: NEGATIVE mg/dL
HGB URINE DIPSTICK: NEGATIVE
Ketones, ur: NEGATIVE mg/dL
Leukocytes, UA: NEGATIVE
Nitrite: NEGATIVE
Protein, ur: NEGATIVE mg/dL
SPECIFIC GRAVITY, URINE: 1.012 (ref 1.005–1.030)
pH: 7 (ref 5.0–8.0)

## 2017-01-07 LAB — CBC WITH DIFFERENTIAL/PLATELET
Basophils Absolute: 0 10*3/uL (ref 0.0–0.1)
Basophils Relative: 0 %
Eosinophils Absolute: 0.1 10*3/uL (ref 0.0–0.7)
Eosinophils Relative: 1 %
HEMATOCRIT: 41.3 % (ref 36.0–46.0)
HEMOGLOBIN: 13.5 g/dL (ref 12.0–15.0)
LYMPHS ABS: 2.1 10*3/uL (ref 0.7–4.0)
Lymphocytes Relative: 27 %
MCH: 27.9 pg (ref 26.0–34.0)
MCHC: 32.7 g/dL (ref 30.0–36.0)
MCV: 85.3 fL (ref 78.0–100.0)
MONOS PCT: 5 %
Monocytes Absolute: 0.4 10*3/uL (ref 0.1–1.0)
NEUTROS ABS: 5.1 10*3/uL (ref 1.7–7.7)
NEUTROS PCT: 67 %
Platelets: 253 10*3/uL (ref 150–400)
RBC: 4.84 MIL/uL (ref 3.87–5.11)
RDW: 13.3 % (ref 11.5–15.5)
WBC: 7.7 10*3/uL (ref 4.0–10.5)

## 2017-01-07 LAB — SURGICAL PCR SCREEN
MRSA, PCR: NEGATIVE
Staphylococcus aureus: POSITIVE — AB

## 2017-01-07 LAB — PROTIME-INR
INR: 0.99
Prothrombin Time: 13.1 seconds (ref 11.4–15.2)

## 2017-01-07 LAB — APTT: aPTT: 29 seconds (ref 24–36)

## 2017-01-07 NOTE — Progress Notes (Signed)
Mupirocin Ointment Rx called into [facility] in [place] for positive PCR of Staph. Pt notified and voiced understanding.

## 2017-01-07 NOTE — Progress Notes (Signed)
PCP: Dr. Brigitte Pulse.  Pt denies cardiac hx or cardiologist, states she had an echo years ago d/t chemo treatment.   Pt with no chest pain, SOB or signs of infection.  Hx bilateral mastectomy. Also requesting not to have SCD placed on right leg d/t hx of pain in that leg.   EKG: 01/07/17 CXR: 01/07/17  Pt has hx of anesthesia reaction lowering HR and resp rate and making patient "shaky" with mastectomy in past. Anesthesia notified.

## 2017-01-07 NOTE — Pre-Procedure Instructions (Signed)
CERENA CESARO  01/07/2017      [id number] - [place], [address] AT [place] [address] [place] [address] Phone: [phone] Fax: [phone]  [place], [place] [address] Phone: [phone] Fax: [phone]  [facility] [place], [address] AT [address] [place] [address] Phone: [phone] Fax: [phone]    Your procedure is scheduled on Thursday March 8.  Report to [facility] Admitting at 5:30 A.M.  Call this number if you have problems the morning of surgery:  [phone]   Remember:  Do not eat food or drink liquids after midnight.  Take these medicines the morning of surgery with A SIP OF WATER: propranolol (Inderal), aripiprazole (Abilify), escitalopram (lexapro), Lyrica, Nucynta, oxycodone percocet IF NEEDED  7 days prior to surgery STOP taking any Aspirin, Aleve, Naproxen, Ibuprofen, Motrin, Advil, Goody's, BC's, all herbal medications, fish oil, and all vitamins    Do not wear jewelry, make-up or nail polish.  Do not wear lotions, powders, or perfumes, or deoderant.  Do not shave 48 hours prior to surgery.  Men may shave face and neck.  Do not bring valuables to the hospital.  [facility] is not responsible for any belongings or valuables.  Contacts, dentures or bridgework may not be worn into surgery.  Leave your suitcase in the car.  After surgery it may be brought to your room.  For patients admitted to the hospital, discharge time will be determined by your treatment team.  Patients discharged the day of surgery will not be allowed to drive home.    Special instructions:    [facility]- Preparing For Surgery  Before surgery, you can play an important role. Because skin is not sterile, your skin needs to be  as free of germs as possible. You can reduce the number of germs on your skin by washing with CHG (chlorahexidine gluconate) Soap before surgery.  CHG is an antiseptic cleaner which kills germs and bonds with the skin to continue killing germs even after washing.  Please do not use if you have an allergy to CHG or antibacterial soaps. If your skin becomes reddened/irritated stop using the CHG.  Do not shave (including legs and underarms) for at least 48 hours prior to first CHG shower. It is OK to shave your face.  Please follow these instructions carefully.   1. Shower the NIGHT BEFORE SURGERY and the MORNING OF SURGERY with CHG.   2. If you chose to wash your hair, wash your hair first as usual with your normal shampoo.  3. After you shampoo, rinse your hair and body thoroughly to remove the shampoo.  4. Use CHG as you would any other liquid soap. You can apply CHG directly to the skin and wash gently with a scrungie or a clean washcloth.   5. Apply the CHG Soap to your body ONLY FROM THE NECK DOWN.  Do not use on open wounds or open sores. Avoid contact with your eyes, ears, mouth and genitals (private parts). Wash genitals (private parts) with your normal soap.  6. Wash thoroughly, paying special attention to the area where your surgery will be performed.  7. Thoroughly rinse your body with warm water from the neck down.  8. DO NOT shower/wash with your normal soap after using and rinsing off the CHG Soap.  9. Pat yourself dry with a CLEAN TOWEL.   10. Wear CLEAN PAJAMAS   11. Place CLEAN SHEETS on your bed the night of your first shower and DO NOT SLEEP WITH PETS.    Day of Surgery: Do not apply any deodorants/lotions. Please wear clean clothes to the hospital/surgery center.      Please read over the following fact sheets that you were given. MRSA Information

## 2017-01-10 ENCOUNTER — Telehealth (INDEPENDENT_AMBULATORY_CARE_PROVIDER_SITE_OTHER): Payer: Self-pay | Admitting: Orthopaedic Surgery

## 2017-01-10 NOTE — Telephone Encounter (Signed)
FAXED 11/11/2016 PROGRESS NOTE TO [facility] AND ADVISED THAT WAS PTS LAST DATE SEEN

## 2017-01-10 NOTE — Progress Notes (Signed)
Anesthesia Chart Review:  Pt is a 56 year old female scheduled for L total shoulder arthroplasty on 01/13/2017 with Tania Ade, MD  - PCP is Marton Redwood, MD  PMH includes:  OSA, hypothyroidism, breast cancer, narcolepsy, depression, post-op N/V.  Current smoker. BMI 31  Anesthesia history: Pt reports respirations and HR dropped and she felt "shaky" after mastectomy in 2008. Surgeon's notes and anesthesia notes from 10/30/07 surgery do not indicate any post-op complications.   Medications include: Abilify, Lexapro, Ritalin, propranolol.   Preoperative labs reviewed.    CXR 01/07/17: No edema or consolidation.  EKG 01/07/17: NSR. Nonspecific T wave abnormality  If no changes, I anticipate pt can proceed with surgery as scheduled.   Willeen Cass, FNP-BC [facility]/Anesthesiology Phone: [phone] 01/10/2017 12:41 PM

## 2017-01-12 MED ORDER — CEFAZOLIN SODIUM-DEXTROSE 2-4 GM/100ML-% IV SOLN
2.0000 g | INTRAVENOUS | Status: AC
  Administered 2017-01-13: 2 g via INTRAVENOUS
  Filled 2017-01-12: qty 100

## 2017-01-12 NOTE — Anesthesia Preprocedure Evaluation (Addendum)
Anesthesia Evaluation  Patient identified by MRN, date of birth, ID band Patient unresponsive    Reviewed: Allergy & Precautions, H&P , NPO status , Patient's Chart, lab work & pertinent test results, reviewed documented beta blocker date and time   History of Anesthesia Complications (+) PONV  Airway Mallampati: II  TM Distance: >3 FB Neck ROM: Full    Dental no notable dental hx. (+) Teeth Intact, Dental Advisory Given   Pulmonary neg pulmonary ROS, sleep apnea and Continuous Positive Airway Pressure Ventilation , Current Smoker,    Pulmonary exam normal breath sounds clear to auscultation       Cardiovascular Exercise Tolerance: Good negative cardio ROS   Rhythm:Regular Rate:Normal     Neuro/Psych  Headaches, Anxiety Depression negative neurological ROS  negative psych ROS   GI/Hepatic negative GI ROS, Neg liver ROS,   Endo/Other  negative endocrine ROSHypothyroidism   Renal/GU negative Renal ROS  negative genitourinary   Musculoskeletal  (+) Arthritis , Osteoarthritis,  Fibromyalgia -  Abdominal   Peds  Hematology negative hematology ROS (+)   Anesthesia Other Findings   Reproductive/Obstetrics negative OB ROS                            Anesthesia Physical Anesthesia Plan  ASA: III  Anesthesia Plan: General   Post-op Pain Management:  Regional for Post-op pain   Induction: Intravenous  Airway Management Planned: Oral ETT  Additional Equipment:   Intra-op Plan:   Post-operative Plan: Extubation in OR  Informed Consent: I have reviewed the patients History and Physical, chart, labs and discussed the procedure including the risks, benefits and alternatives for the proposed anesthesia with the patient or authorized representative who has indicated his/her understanding and acceptance.   Dental advisory given  Plan Discussed with: CRNA  Anesthesia Plan Comments:         Anesthesia Quick Evaluation

## 2017-01-13 ENCOUNTER — Inpatient Hospital Stay (HOSPITAL_COMMUNITY): Payer: BLUE CROSS/BLUE SHIELD

## 2017-01-13 ENCOUNTER — Inpatient Hospital Stay (HOSPITAL_COMMUNITY)
Admission: RE | Admit: 2017-01-13 | Discharge: 2017-01-14 | DRG: 483 | Disposition: A | Discharge status: Home / Self Care | Payer: BLUE CROSS/BLUE SHIELD | Source: Ambulatory Visit | Attending: Orthopedic Surgery | Admitting: Orthopedic Surgery

## 2017-01-13 ENCOUNTER — Encounter (HOSPITAL_COMMUNITY): Payer: Self-pay | Admitting: Certified Registered Nurse Anesthetist

## 2017-01-13 ENCOUNTER — Inpatient Hospital Stay (HOSPITAL_COMMUNITY): Payer: BLUE CROSS/BLUE SHIELD | Admitting: Vascular Surgery

## 2017-01-13 ENCOUNTER — Inpatient Hospital Stay (HOSPITAL_COMMUNITY): Payer: BLUE CROSS/BLUE SHIELD | Admitting: Anesthesiology

## 2017-01-13 ENCOUNTER — Encounter (HOSPITAL_COMMUNITY)
Admission: RE | Disposition: A | Discharge status: Home / Self Care | Payer: Self-pay | Source: Ambulatory Visit | Attending: Orthopedic Surgery

## 2017-01-13 DIAGNOSIS — F329 Major depressive disorder, single episode, unspecified: Secondary | ICD-10-CM | POA: Diagnosis present

## 2017-01-13 DIAGNOSIS — M19012 Primary osteoarthritis, left shoulder: Secondary | ICD-10-CM

## 2017-01-13 DIAGNOSIS — F17210 Nicotine dependence, cigarettes, uncomplicated: Secondary | ICD-10-CM | POA: Diagnosis present

## 2017-01-13 DIAGNOSIS — M25512 Pain in left shoulder: Secondary | ICD-10-CM | POA: Diagnosis present

## 2017-01-13 DIAGNOSIS — G4710 Hypersomnia, unspecified: Secondary | ICD-10-CM | POA: Diagnosis present

## 2017-01-13 DIAGNOSIS — G4730 Sleep apnea, unspecified: Secondary | ICD-10-CM | POA: Diagnosis present

## 2017-01-13 DIAGNOSIS — M797 Fibromyalgia: Secondary | ICD-10-CM | POA: Diagnosis present

## 2017-01-13 DIAGNOSIS — Z79899 Other long term (current) drug therapy: Secondary | ICD-10-CM | POA: Diagnosis not present

## 2017-01-13 DIAGNOSIS — G47419 Narcolepsy without cataplexy: Secondary | ICD-10-CM | POA: Diagnosis present

## 2017-01-13 DIAGNOSIS — E039 Hypothyroidism, unspecified: Secondary | ICD-10-CM | POA: Diagnosis present

## 2017-01-13 DIAGNOSIS — Z853 Personal history of malignant neoplasm of breast: Secondary | ICD-10-CM

## 2017-01-13 DIAGNOSIS — F419 Anxiety disorder, unspecified: Secondary | ICD-10-CM | POA: Diagnosis present

## 2017-01-13 DIAGNOSIS — Z96612 Presence of left artificial shoulder joint: Secondary | ICD-10-CM

## 2017-01-13 HISTORY — PX: TOTAL SHOULDER ARTHROPLASTY: SHX126

## 2017-01-13 SURGERY — ARTHROPLASTY, SHOULDER, TOTAL
Anesthesia: General | Site: Shoulder | Laterality: Left

## 2017-01-13 MED ORDER — PHENYLEPHRINE 40 MCG/ML (10ML) SYRINGE FOR IV PUSH (FOR BLOOD PRESSURE SUPPORT)
PREFILLED_SYRINGE | INTRAVENOUS | Status: AC
  Filled 2017-01-13: qty 10

## 2017-01-13 MED ORDER — LIDOCAINE 2% (20 MG/ML) 5 ML SYRINGE
INTRAMUSCULAR | Status: AC
  Filled 2017-01-13: qty 5

## 2017-01-13 MED ORDER — BUPIVACAINE LIPOSOME 1.3 % IJ SUSP
20.0000 mL | INTRAMUSCULAR | Status: DC
  Filled 2017-01-13: qty 20

## 2017-01-13 MED ORDER — 0.9 % SODIUM CHLORIDE (POUR BTL) OPTIME
TOPICAL | Status: DC | PRN
  Administered 2017-01-13: 1000 mL

## 2017-01-13 MED ORDER — TAPENTADOL HCL 50 MG PO TABS
50.0000 mg | ORAL_TABLET | Freq: Four times a day (QID) | ORAL | Status: DC
  Administered 2017-01-14: 50 mg via ORAL
  Filled 2017-01-13 (×2): qty 1

## 2017-01-13 MED ORDER — ASPIRIN EC 325 MG PO TBEC
325.0000 mg | DELAYED_RELEASE_TABLET | Freq: Every day | ORAL | Status: DC
  Administered 2017-01-13 – 2017-01-14 (×2): 325 mg via ORAL
  Filled 2017-01-13 (×2): qty 1

## 2017-01-13 MED ORDER — MORPHINE SULFATE (PF) 2 MG/ML IV SOLN
1.0000 mg | INTRAVENOUS | Status: DC | PRN
  Administered 2017-01-14: 2 mg via INTRAVENOUS
  Filled 2017-01-13: qty 1

## 2017-01-13 MED ORDER — TAPENTADOL HCL ER 50 MG PO TB12: 250.0000 mg | ORAL_TABLET | Freq: Two times a day (BID) | ORAL | Status: DC

## 2017-01-13 MED ORDER — BISACODYL 5 MG PO TBEC: 5.0000 mg | DELAYED_RELEASE_TABLET | Freq: Every day | ORAL | Status: DC | PRN

## 2017-01-13 MED ORDER — LIDOCAINE HCL 4 % EX SOLN
CUTANEOUS | Status: DC | PRN
  Administered 2017-01-13: 3 mL via TOPICAL

## 2017-01-13 MED ORDER — ESCITALOPRAM OXALATE 10 MG PO TABS
20.0000 mg | ORAL_TABLET | Freq: Every day | ORAL | Status: DC
  Administered 2017-01-14: 20 mg via ORAL
  Filled 2017-01-13: qty 2

## 2017-01-13 MED ORDER — FENTANYL CITRATE (PF) 100 MCG/2ML IJ SOLN
INTRAMUSCULAR | Status: DC | PRN
  Administered 2017-01-13 (×2): 50 ug via INTRAVENOUS

## 2017-01-13 MED ORDER — PHENYLEPHRINE HCL 10 MG/ML IJ SOLN
INTRAMUSCULAR | Status: DC | PRN
  Administered 2017-01-13: 80 ug via INTRAVENOUS

## 2017-01-13 MED ORDER — BENZTROPINE MESYLATE 1 MG PO TABS
1.0000 mg | ORAL_TABLET | Freq: Every day | ORAL | Status: DC
  Filled 2017-01-13: qty 1

## 2017-01-13 MED ORDER — SODIUM CHLORIDE 0.9% FLUSH
INTRAVENOUS | Status: DC | PRN
  Administered 2017-01-13: 40 mL via INTRAVENOUS

## 2017-01-13 MED ORDER — CEFAZOLIN SODIUM-DEXTROSE 2-4 GM/100ML-% IV SOLN
2.0000 g | Freq: Four times a day (QID) | INTRAVENOUS | Status: AC
  Administered 2017-01-13 – 2017-01-14 (×3): 2 g via INTRAVENOUS
  Filled 2017-01-13 (×3): qty 100

## 2017-01-13 MED ORDER — METOCLOPRAMIDE HCL 5 MG/ML IJ SOLN
5.0000 mg | Freq: Three times a day (TID) | INTRAMUSCULAR | Status: DC | PRN
Start: 2017-01-13 — End: 2017-01-14

## 2017-01-13 MED ORDER — OXYCODONE HCL 5 MG PO TABS
5.0000 mg | ORAL_TABLET | ORAL | Status: DC | PRN
  Administered 2017-01-14: 10 mg via ORAL
  Administered 2017-01-14: 5 mg via ORAL
  Filled 2017-01-13: qty 1
  Filled 2017-01-13: qty 2

## 2017-01-13 MED ORDER — DIPHENHYDRAMINE HCL 12.5 MG/5ML PO ELIX: 12.5000 mg | ORAL_SOLUTION | ORAL | Status: DC | PRN

## 2017-01-13 MED ORDER — ARIPIPRAZOLE 10 MG PO TABS
20.0000 mg | ORAL_TABLET | Freq: Every day | ORAL | Status: DC
  Administered 2017-01-14: 20 mg via ORAL
  Filled 2017-01-13: qty 2
  Filled 2017-01-13: qty 4
  Filled 2017-01-13: qty 2

## 2017-01-13 MED ORDER — PHENOL 1.4 % MT LIQD: 1.0000 | OROMUCOSAL | Status: DC | PRN

## 2017-01-13 MED ORDER — ZOLPIDEM TARTRATE 5 MG PO TABS: 5.0000 mg | ORAL_TABLET | Freq: Every evening | ORAL | Status: DC | PRN

## 2017-01-13 MED ORDER — LIDOCAINE HCL (CARDIAC) 20 MG/ML IV SOLN
INTRAVENOUS | Status: DC | PRN
  Administered 2017-01-13: 40 mg via INTRAVENOUS

## 2017-01-13 MED ORDER — SCOPOLAMINE 1 MG/3DAYS TD PT72
MEDICATED_PATCH | TRANSDERMAL | Status: DC | PRN
  Administered 2017-01-13: 1 via TRANSDERMAL

## 2017-01-13 MED ORDER — DOCUSATE SODIUM 100 MG PO CAPS
100.0000 mg | ORAL_CAPSULE | Freq: Two times a day (BID) | ORAL | Status: DC
  Administered 2017-01-13 – 2017-01-14 (×3): 100 mg via ORAL
  Filled 2017-01-13 (×3): qty 1

## 2017-01-13 MED ORDER — METHYLPHENIDATE HCL ER (LA) 10 MG PO CP24
30.0000 mg | ORAL_CAPSULE | Freq: Every day | ORAL | Status: DC
  Administered 2017-01-14: 30 mg via ORAL
  Filled 2017-01-13: qty 3

## 2017-01-13 MED ORDER — ONDANSETRON HCL 4 MG/2ML IJ SOLN
INTRAMUSCULAR | Status: AC
  Filled 2017-01-13: qty 2

## 2017-01-13 MED ORDER — PROPRANOLOL HCL 20 MG PO TABS
20.0000 mg | ORAL_TABLET | Freq: Three times a day (TID) | ORAL | Status: DC | PRN
  Administered 2017-01-13: 20 mg via ORAL
  Filled 2017-01-13: qty 1

## 2017-01-13 MED ORDER — SUCCINYLCHOLINE CHLORIDE 200 MG/10ML IV SOSY
PREFILLED_SYRINGE | INTRAVENOUS | Status: AC
  Filled 2017-01-13: qty 10

## 2017-01-13 MED ORDER — ACETAMINOPHEN 650 MG RE SUPP: 650.0000 mg | Freq: Four times a day (QID) | RECTAL | Status: DC | PRN

## 2017-01-13 MED ORDER — ONDANSETRON HCL 4 MG/2ML IJ SOLN: 4.0000 mg | Freq: Four times a day (QID) | INTRAMUSCULAR | Status: DC | PRN

## 2017-01-13 MED ORDER — SODIUM CHLORIDE 0.9 % IV SOLN
1000.0000 mg | INTRAVENOUS | Status: AC
  Administered 2017-01-13: 1000 mg via INTRAVENOUS
  Filled 2017-01-13: qty 10

## 2017-01-13 MED ORDER — PROPOFOL 10 MG/ML IV BOLUS
INTRAVENOUS | Status: AC
  Filled 2017-01-13: qty 20

## 2017-01-13 MED ORDER — MENTHOL 3 MG MT LOZG: 1.0000 | LOZENGE | OROMUCOSAL | Status: DC | PRN

## 2017-01-13 MED ORDER — ONDANSETRON HCL 4 MG PO TABS: 4.0000 mg | ORAL_TABLET | Freq: Four times a day (QID) | ORAL | Status: DC | PRN

## 2017-01-13 MED ORDER — FLEET ENEMA 7-19 GM/118ML RE ENEM: 1.0000 | ENEMA | Freq: Once | RECTAL | Status: DC | PRN

## 2017-01-13 MED ORDER — BUPIVACAINE-EPINEPHRINE (PF) 0.5% -1:200000 IJ SOLN
INTRAMUSCULAR | Status: DC | PRN
  Administered 2017-01-13: 30 mL via PERINEURAL

## 2017-01-13 MED ORDER — ACETAMINOPHEN 325 MG PO TABS
650.0000 mg | ORAL_TABLET | Freq: Four times a day (QID) | ORAL | Status: DC | PRN
Start: 2017-01-13 — End: 2017-01-14
  Administered 2017-01-14: 650 mg via ORAL
  Filled 2017-01-13: qty 2

## 2017-01-13 MED ORDER — PHENYLEPHRINE HCL 10 MG/ML IJ SOLN
INTRAVENOUS | Status: DC | PRN
  Administered 2017-01-13: 50 ug/min via INTRAVENOUS

## 2017-01-13 MED ORDER — POVIDONE-IODINE 7.5 % EX SOLN: Freq: Once | CUTANEOUS | Status: DC

## 2017-01-13 MED ORDER — LACTATED RINGERS IV SOLN
INTRAVENOUS | Status: DC | PRN
  Administered 2017-01-13 (×2): via INTRAVENOUS

## 2017-01-13 MED ORDER — ACETAMINOPHEN 500 MG PO TABS
1000.0000 mg | ORAL_TABLET | Freq: Four times a day (QID) | ORAL | Status: AC
  Administered 2017-01-13 – 2017-01-14 (×4): 1000 mg via ORAL
  Filled 2017-01-13 (×4): qty 2

## 2017-01-13 MED ORDER — METOCLOPRAMIDE HCL 5 MG PO TABS: 5.0000 mg | ORAL_TABLET | Freq: Three times a day (TID) | ORAL | Status: DC | PRN

## 2017-01-13 MED ORDER — SODIUM CHLORIDE 0.9 % IV SOLN
INTRAVENOUS | Status: DC
  Administered 2017-01-13: 12:00:00 via INTRAVENOUS
  Administered 2017-01-13 – 2017-01-14 (×2): 125 mL/h via INTRAVENOUS

## 2017-01-13 MED ORDER — PROPOFOL 10 MG/ML IV BOLUS
INTRAVENOUS | Status: DC | PRN
  Administered 2017-01-13: 200 mg via INTRAVENOUS

## 2017-01-13 MED ORDER — POLYETHYLENE GLYCOL 3350 17 G PO PACK: 17.0000 g | PACK | Freq: Every day | ORAL | Status: DC | PRN

## 2017-01-13 MED ORDER — PROPRANOLOL HCL ER 120 MG PO CP24
120.0000 mg | ORAL_CAPSULE | Freq: Two times a day (BID) | ORAL | Status: DC
  Filled 2017-01-13 (×2): qty 1

## 2017-01-13 MED ORDER — ALUM & MAG HYDROXIDE-SIMETH 200-200-20 MG/5ML PO SUSP: 30.0000 mL | ORAL | Status: DC | PRN

## 2017-01-13 MED ORDER — PREGABALIN 100 MG PO CAPS
100.0000 mg | ORAL_CAPSULE | Freq: Two times a day (BID) | ORAL | Status: DC
Start: 2017-01-13 — End: 2017-01-14
  Administered 2017-01-14: 100 mg via ORAL
  Filled 2017-01-13 (×2): qty 1

## 2017-01-13 MED ORDER — DIPHENHYDRAMINE HCL 50 MG/ML IJ SOLN
INTRAMUSCULAR | Status: DC | PRN
  Administered 2017-01-13: 12.5 mg via INTRAVENOUS

## 2017-01-13 MED ORDER — DEXAMETHASONE SODIUM PHOSPHATE 10 MG/ML IJ SOLN
INTRAMUSCULAR | Status: DC | PRN
  Administered 2017-01-13: 10 mg via INTRAVENOUS

## 2017-01-13 MED ORDER — BUPIVACAINE LIPOSOME 1.3 % IJ SUSP
INTRAMUSCULAR | Status: DC | PRN
  Administered 2017-01-13: 20 mL

## 2017-01-13 MED ORDER — FENTANYL CITRATE (PF) 100 MCG/2ML IJ SOLN
INTRAMUSCULAR | Status: AC
  Filled 2017-01-13: qty 4

## 2017-01-13 MED ORDER — MIDAZOLAM HCL 5 MG/5ML IJ SOLN
INTRAMUSCULAR | Status: DC | PRN
  Administered 2017-01-13: 2 mg via INTRAVENOUS

## 2017-01-13 MED ORDER — SCOPOLAMINE 1 MG/3DAYS TD PT72
MEDICATED_PATCH | TRANSDERMAL | Status: AC
  Filled 2017-01-13: qty 1

## 2017-01-13 MED ORDER — ONDANSETRON HCL 4 MG/2ML IJ SOLN
INTRAMUSCULAR | Status: DC | PRN
  Administered 2017-01-13: 4 mg via INTRAVENOUS

## 2017-01-13 MED ORDER — MIDAZOLAM HCL 2 MG/2ML IJ SOLN
INTRAMUSCULAR | Status: AC
  Filled 2017-01-13: qty 2

## 2017-01-13 SURGICAL SUPPLY — 74 items
AID PSTN UNV HD RSTRNT DISP (MISCELLANEOUS) ×1
BIT DRILL 5/64X5 DISP (BIT) IMPLANT
BLADE SAW SAG 73X25 THK (BLADE) ×2
BLADE SAW SGTL 73X25 THK (BLADE) ×1 IMPLANT
BLADE SURG 15 STRL LF DISP TIS (BLADE) ×1 IMPLANT
BLADE SURG 15 STRL SS (BLADE) ×3
CAP SHOULDER TOTAL 2 ×2 IMPLANT
CEMENT BONE DEPUY (Cement) ×2 IMPLANT
CHLORAPREP W/TINT 26ML (MISCELLANEOUS) ×5 IMPLANT
CLOSURE STERI-STRIP 1/2X4 (GAUZE/BANDAGES/DRESSINGS) ×1
CLOSURE WOUND 1/2 X4 (GAUZE/BANDAGES/DRESSINGS)
CLSR STERI-STRIP ANTIMIC 1/2X4 (GAUZE/BANDAGES/DRESSINGS) ×1 IMPLANT
COVER SURGICAL LIGHT HANDLE (MISCELLANEOUS) ×3 IMPLANT
DRAPE INCISE IOBAN 66X45 STRL (DRAPES) ×3 IMPLANT
DRAPE ORTHO SPLIT 77X108 STRL (DRAPES) ×6
DRAPE SURG 17X23 STRL (DRAPES) ×3 IMPLANT
DRAPE SURG ORHT 6 SPLT 77X108 (DRAPES) ×2 IMPLANT
DRAPE U-SHAPE 47X51 STRL (DRAPES) ×3 IMPLANT
DRSG AQUACEL AG ADV 3.5X10 (GAUZE/BANDAGES/DRESSINGS) ×2 IMPLANT
ELECT BLADE 4.0 EZ CLEAN MEGAD (MISCELLANEOUS) ×3
ELECT REM PT RETURN 9FT ADLT (ELECTROSURGICAL) ×3
ELECTRODE BLDE 4.0 EZ CLN MEGD (MISCELLANEOUS) IMPLANT
ELECTRODE REM PT RTRN 9FT ADLT (ELECTROSURGICAL) ×1 IMPLANT
EVACUATOR 1/8 PVC DRAIN (DRAIN) IMPLANT
GLOVE BIO SURGEON STRL SZ7 (GLOVE) ×3 IMPLANT
GLOVE BIO SURGEON STRL SZ7.5 (GLOVE) ×3 IMPLANT
GLOVE BIOGEL PI IND STRL 7.0 (GLOVE) ×1 IMPLANT
GLOVE BIOGEL PI IND STRL 8 (GLOVE) ×1 IMPLANT
GLOVE BIOGEL PI INDICATOR 7.0 (GLOVE) ×2
GLOVE BIOGEL PI INDICATOR 8 (GLOVE) ×2
GOWN STRL REUS W/ TWL LRG LVL3 (GOWN DISPOSABLE) ×1 IMPLANT
GOWN STRL REUS W/ TWL XL LVL3 (GOWN DISPOSABLE) ×1 IMPLANT
GOWN STRL REUS W/TWL LRG LVL3 (GOWN DISPOSABLE) ×3
GOWN STRL REUS W/TWL XL LVL3 (GOWN DISPOSABLE) ×3
HANDPIECE INTERPULSE COAX TIP (DISPOSABLE) ×3
HEMOSTAT SURGICEL 2X14 (HEMOSTASIS) ×3 IMPLANT
HOOD PEEL AWAY FLYTE STAYCOOL (MISCELLANEOUS) ×6 IMPLANT
KIT BASIN OR (CUSTOM PROCEDURE TRAY) ×3 IMPLANT
KIT ROOM TURNOVER OR (KITS) ×3 IMPLANT
MANIFOLD NEPTUNE II (INSTRUMENTS) ×3 IMPLANT
NDL HYPO 25GX1X1/2 BEV (NEEDLE) IMPLANT
NDL MAYO TROCAR (NEEDLE) ×1 IMPLANT
NDL SPNL 18GX3.5 QUINCKE PK (NEEDLE) ×2 IMPLANT
NEEDLE HYPO 25GX1X1/2 BEV (NEEDLE) IMPLANT
NEEDLE MAYO TROCAR (NEEDLE) ×3 IMPLANT
NEEDLE SPNL 18GX3.5 QUINCKE PK (NEEDLE) ×6 IMPLANT
NS IRRIG 1000ML POUR BTL (IV SOLUTION) ×3 IMPLANT
PACK SHOULDER (CUSTOM PROCEDURE TRAY) ×3 IMPLANT
PAD ARMBOARD 7.5X6 YLW CONV (MISCELLANEOUS) ×6 IMPLANT
RESTRAINT HEAD UNIVERSAL NS (MISCELLANEOUS) ×3 IMPLANT
RETRIEVER SUT HEWSON (MISCELLANEOUS) ×3 IMPLANT
SET HNDPC FAN SPRY TIP SCT (DISPOSABLE) ×1 IMPLANT
SLING ARM IMMOBILIZER LRG (SOFTGOODS) ×3 IMPLANT
SLING ARM IMMOBILIZER MED (SOFTGOODS) IMPLANT
SMARTMIX MINI TOWER (MISCELLANEOUS) ×3
SPONGE LAP 18X18 X RAY DECT (DISPOSABLE) ×3 IMPLANT
SPONGE LAP 4X18 X RAY DECT (DISPOSABLE) IMPLANT
STRIP CLOSURE SKIN 1/2X4 (GAUZE/BANDAGES/DRESSINGS) ×1 IMPLANT
SUCTION FRAZIER HANDLE 10FR (MISCELLANEOUS) ×2
SUCTION TUBE FRAZIER 10FR DISP (MISCELLANEOUS) ×1 IMPLANT
SUPPORT WRAP ARM LG (MISCELLANEOUS) ×3 IMPLANT
SUT ETHIBOND NAB CT1 #1 30IN (SUTURE) ×9 IMPLANT
SUT MNCRL AB 4-0 PS2 18 (SUTURE) ×3 IMPLANT
SUT SILK 2 0 TIES 17X18 (SUTURE)
SUT SILK 2-0 18XBRD TIE BLK (SUTURE) IMPLANT
SUT VIC AB 2-0 CT1 27 (SUTURE) ×3
SUT VIC AB 2-0 CT1 TAPERPNT 27 (SUTURE) ×1 IMPLANT
SYR 50ML LL SCALE MARK (SYRINGE) ×6 IMPLANT
SYR CONTROL 10ML LL (SYRINGE) IMPLANT
TAPE LABRALWHITE 1.5X36 (TAPE) ×6 IMPLANT
TAPE SUT LABRALTAP WHT/BLK (SUTURE) ×3 IMPLANT
TOWEL OR 17X24 6PK STRL BLUE (TOWEL DISPOSABLE) ×3 IMPLANT
TOWEL OR 17X26 10 PK STRL BLUE (TOWEL DISPOSABLE) ×3 IMPLANT
TOWER SMARTMIX MINI (MISCELLANEOUS) ×1 IMPLANT

## 2017-01-13 NOTE — Anesthesia Procedure Notes (Signed)
Procedure Name: Intubation Date/Time: 01/13/2017 7:45 AM Performed by: Rejeana Brock L Pre-anesthesia Checklist: Patient identified, Emergency Drugs available, Suction available and Patient being monitored Patient Re-evaluated:Patient Re-evaluated prior to inductionOxygen Delivery Method: Circle System Utilized Preoxygenation: Pre-oxygenation with 100% oxygen Intubation Type: IV induction Ventilation: Mask ventilation without difficulty and Oral airway inserted - appropriate to patient size Laryngoscope Size: Mac and 3 Grade View: Grade I Tube type: Oral Tube size: 7.0 mm Number of attempts: 1 Airway Equipment and Method: Stylet and Oral airway Placement Confirmation: ETT inserted through vocal cords under direct vision,  positive ETCO2 and breath sounds checked- equal and bilateral Secured at: 22 cm Tube secured with: Tape Dental Injury: Teeth and Oropharynx as per pre-operative assessment

## 2017-01-13 NOTE — Anesthesia Postprocedure Evaluation (Signed)
Anesthesia Post Note  Patient: [name]  Procedure(s) Performed: Procedure(s) (LRB): LEFT TOTAL SHOULDER ARTHROPLASTY (Left)  Patient location during evaluation: PACU Anesthesia Type: General and Regional Level of consciousness: awake and alert Pain management: pain level controlled Vital Signs Assessment: post-procedure vital signs reviewed and stable Respiratory status: spontaneous breathing, nonlabored ventilation and respiratory function stable Cardiovascular status: blood pressure returned to baseline and stable Postop Assessment: no signs of nausea or vomiting Anesthetic complications: no       Last Vitals:  Vitals:   01/13/17 1103 01/13/17 1109  BP:  108/69  Pulse: 78 76  Resp: 16 15  Temp: 36.6 C     Last Pain:  Vitals:   01/13/17 1103  TempSrc:   PainSc: 0-No pain                 Tamiya Colello,W. EDMOND

## 2017-01-13 NOTE — Transfer of Care (Signed)
Immediate Anesthesia Transfer of Care Note  Patient: [name]  Procedure(s) Performed: Procedure(s) with comments: LEFT TOTAL SHOULDER ARTHROPLASTY (Left) - LEFT TOTAL SHOULDER ARTHROPLASTY  Patient Location: PACU  Anesthesia Type:GA combined with regional for post-op pain  Level of Consciousness: awake  Airway & Oxygen Therapy: Patient Spontanous Breathing and Patient connected to face mask oxygen  Post-op Assessment: Report given to RN and Post -op Vital signs reviewed and stable  Post vital signs: Reviewed and stable  Last Vitals:  Vitals:   01/13/17 0632  BP: (!) 99/51  Pulse: 69  Resp: 20  Temp: 36.6 C    Last Pain:  Vitals:   01/13/17 0086  TempSrc: Oral  PainSc:          Complications: No apparent anesthesia complications

## 2017-01-13 NOTE — Discharge Instructions (Signed)
Discharge Instructions after Total Shoulder Arthroplasty   A sling has been provided for you. Remove the sling 5 times each day to perform motion exercises. Continue wearing your sling at all times until your first visit with Dr. Tamera Punt. Okay to remove it to bathe and dress. Use ice on the shoulder intermittently over the first 48 hours after surgery.  Pain medication has been prescribed for you.  Use your medication liberally over the first 48 hours, and then begin to taper your use. You may take Extra Strength Tylenol or Tylenol only in place of the pain pills. DO NOT take ANY nonsteroidal anti-inflammatory pain medications: Advil, Motrin, Ibuprofen, Aleve, Naproxen, or Naprosyn. Take one aspirin a day for 2 weeks after surgery, unless you have an aspirin sensitivity/allergy or asthma. Leave your dressing on until your first follow up visit.  You may shower with the dressing.  Hold your arm as if you still have your sling on while you shower. Active reaching and lifting are not permitted. You may use the operative arm for activities of daily living that do not require the operative arm to leave the side of the body, such as eating, drinking, bathing, etc.  Three to 5 times each day you should perform assisted overhead reaching and external rotation (outward turning) exercises with the operative arm. You were taught these exercises prior to discharge. Both exercises should be done with the non-operative arm used as the "therapist arm" while the operative arm remains relaxed. Ten of each exercise should be done three to five times each day.   Overhead reach is helping to lift your stiff arm up as high as it will go. To stretch your overhead reach, lie flat on your back, relax, and grasp the wrist of the tight shoulder with your opposite hand. Using the power in your opposite arm, bring the stiff arm up as far as it is comfortable. Start holding it for ten seconds and then work up to where you can hold  it for a count of 30. Breathe slowly and deeply while the arm is moved. Repeat this stretch ten times, trying to help the ar up a little higher each time.     External rotation is turning the arm out to the side while your elbow stays close to your body. External rotation is best stretched while you are lying on your back. Hold a cane, yardstick, broom handle, or dowel in both hands. Bend both elbows to a right angle. Use steady, gentle force from your normal arm to rotate the hand of the stiff shoulder out away from your body. Continue the rotation as far as it will go comfortably, holding it there for a count of 10. Repeat this exercise ten times.      Please call [phone] during normal business hours or [phone] after hours for any problems. Including the following:  - excessive redness of the incisions - drainage for more than 4 days - fever of more than 101.5 F  *Please note that pain medications will not be refilled after hours or on weekends.

## 2017-01-13 NOTE — Op Note (Signed)
Procedure(s): LEFT TOTAL SHOULDER ARTHROPLASTY Procedure Note  [name] female 56 y.o. 01/13/2017  Procedure(s) and Anesthesia Type:    * LEFT TOTAL SHOULDER ARTHROPLASTY - Choice  Surgeon(s) and Role:    * Tania Ade, MD - Primary   Indications:  56 y.o. female  With endstage left shoulder arthritis. Pain and dysfunction interfered with quality of life and nonoperative treatment with activity modification, NSAIDS and injections failed.     Surgeon: Nita Sells   Assistants: Jeanmarie Hubert PA-C [facility] was present and scrubbed throughout the procedure and was essential in positioning, retraction, exposure, and closure)  Anesthesia: General endotracheal anesthesia      Procedure Detail  LEFT TOTAL SHOULDER ARTHROPLASTY  Findings: Tornier flex anatomic press-fit size 2 stem with a 43 head, cemented size 30S Cortiloc glenoid.  A lesser tuberosity osteotomy was performed and repaired at the conclusion of the procedure.  Estimated Blood Loss:  300 mL         Drains: None   Blood Given: none          Specimens: none        Complications:  * No complications entered in OR log *         Disposition: PACU - hemodynamically stable.         Condition: stable    Procedure:   The patient was identified in the preoperative holding area where I personally marked the operative extremity after verifying with the patient and consent. She  was taken to the operating room where She was transferred to the   operative table.  The patient received an interscalene block in   the holding area by the attending anesthesiologist.  General anesthesia was induced   in the operating room without complication.  The patient did receive IV  Ancef prior to the commencement of the procedure.  The patient was   placed in the beach-chair position with the back raised about 30   degrees.  The nonoperative extremity and head and neck were carefully   positioned and  padded protecting against neurovascular compromise.  The   left upper extremity was then prepped and draped in the standard sterile   fashion.    The appropriate operative time-out was performed with   Anesthesia, the perioperative staff, as well as myself and we all agreed   that the left side was the correct operative site.  An approximately   10 cm incision was made from the tip of the coracoid to the center point of the   humerus at the level of the axilla.  Dissection was carried down sharply   through subcutaneous tissues and cephalic vein was identified and taken   laterally with the deltoid.  The pectoralis major was taken medially.  The   upper 1 cm of the pectoralis major was released from its attachment on   the humerus.  The clavipectoral fascia was incised just lateral to the   conjoined tendon.  This incision was carried up to but not into the   coracoacromial ligament.  Digital palpation was used to prove   integrity of the axillary nerve which was protected throughout the   procedure.  Musculocutaneous nerve was not palpated in the operative   field.  Conjoined tendon was then retracted gently medially and the   deltoid laterally.  Anterior circumflex humeral vessels were clamped and   coagulated.  The soft tissues overlying the biceps was incised and this   incision was carried  across the transverse humeral ligament to the base   of the coracoid.  The biceps was tenodesed to the soft tissue just above   pectoralis major and the remaining portion of the biceps superiorly was   excised.  An osteotomy was performed at the lesser tuberosity.  Capsule was then   released all the way down to the 6 o'clock position of the humeral head.   The humeral head was then delivered with simultaneous adduction,   extension and external rotation.  All humeral osteophytes were removed   and the anatomic neck of the humerus was marked and cut free hand at   approximately 25 degrees  retroversion within about 3 mm of the cuff   reflection posteriorly.  The head size was estimated to be a 43 medium   offset.  At that point, the humeral head was retracted posteriorly with   a Fukuda retractor.   Remaining portion of the capsule was released at the base of the   coracoid.  The remaining biceps anchor and the entire anterior-inferior   labrum was excised.  The posterior labrum was also excised but the   posterior capsule was not released.  The guidepin was placed bicortically with 0 guide.  The reamer was used to ream to concentric bone with punctate bleeding.  This gave an excellent concentric surface.  The center hole was then drilled for an anchor peg glenoid followed by the three peripheral holes and the anterior inferior hole penetrated the anterior glenoid wall with a small perforation..  I then pulse irrigated these holes and dried   them with Surgicel.  The three peripheral holes were then   pressurized cemented and the anchor peg glenoid was placed and impacted   with an excellent fit.  The glenoid was a 30 small component.  The proximal humerus was then again exposed taking care not to displace the glenoid.    The entry awl was used followed by sounding reamers and then sequentially broached from size 1 to 2. This was then left in place and the calcar planer was used. Trial head was placed with a 43.  With the trial implantation of the component,  there was approximately 50% posterior translation with immediate snap back to the   anatomic position.  With forward elevation, there was no tendency   towards posterior subluxation.   The trial was removed and the final implant was prepared on a back table.  The trial was removed and the final implant was prepared on a back table.   3 small holes were drilled on the medial side of the lesser tuberosity osteotomy, through which 2 labral tapes were passed. The implant was then placed through the loop of the 2 labral tapes and  impacted with an excellent press-fit. This achieved excellent anatomic reconstruction of the proximal humerus.  The joint was then copiously irrigated with pulse lavage.  The subscapularis and   lesser tuberosity osteotomy were then repaired using the 2 labral tapes previously passed in a double row fashion with horizontal mattress sutures medially brought over through bone tunnels tied over a bone bridge laterally.   One #1 Ethibond was placed at the rotator interval just above   the lesser tuberosity. Copious irrigation was used. Skin was closed with 2-0 Vicryl sutures in the deep dermal layer and 4-0 Monocryl in a subcuticular  running fashion.  Sterile dressings were then applied including Aquacel.  The patient was placed in a sling and allowed to  awaken from general anesthesia and taken to the recovery room in stable condition.      POSTOPERATIVE PLAN:  Early passive range of motion will be allowed with the goal of 0 degrees external rotation and 90 degrees forward elevation.  No internal rotation at this time.  No active motion of the arm until the lesser tuberosity heals.  The patient will likely be kept in the hospital for 1-2 days and then discharged home.

## 2017-01-13 NOTE — Anesthesia Procedure Notes (Signed)
Anesthesia Regional Block: Interscalene brachial plexus block   Pre-Anesthetic Checklist: ,, timeout performed, Correct Patient, Correct Site, Correct Laterality, Correct Procedure, Correct Position, site marked, Risks and benefits discussed, pre-op evaluation,  At surgeon's request and post-op pain management  Laterality: Left  Prep: Maximum Sterile Barrier Precautions used, chloraprep       Needles:  Injection technique: Single-shot  Needle Type: Echogenic Stimulator Needle     Needle Length: 5cm  Needle Gauge: 22     Additional Needles:   Procedures: ultrasound guided, nerve stimulator,,,,,,   Nerve Stimulator or Paresthesia:  Response: Biceps response,   Additional Responses:   Narrative:  Start time: 01/13/2017 7:02 AM End time: 01/13/2017 7:12 AM Injection made incrementally with aspirations every 5 mL. Anesthesiologist: Roderic Palau  Additional Notes: 2% Lidocaine skin wheel.

## 2017-01-13 NOTE — H&P (Signed)
[name] is an 56 y.o. female.   Chief Complaint: L shoulder pain and dysfunction HPI: Endstage L shoulder arthritis with significant pain and dysfunction, failed conservative measures of injections, medication and arthroscopic debridement.  Pain interferes with sleep and quality of life.   Past Medical History:  Diagnosis Date  . Anxiety   . Arthritis   . Breast cancer ([place])    rt breast  and left breast breast removed  . Breast reconstruction deformity   . Complication of anesthesia    shaking-after mastectomies-resp and hr dropped  . Depression, major    suicidal thoughts -implants, mental health, 05/11  . Fibromyalgia   . Headache(784.0)   . Hypersomnia    sleep attacks  . Hypothyroidism   . Migraine headache   . Narcolepsy   . Neoplasm    malignant breast, history of status post bilateral  . Post-operative nausea and vomiting   . Sleep apnea    CPAP  . Sleep apnea with use of continuous positive airway pressure (CPAP)    AHI 20 in HST, titration to 6 cm water - residual AHi 6 . remaining hypersomnic.     Past Surgical History:  Procedure Laterality Date  . Breast reconstructioin  7/09   flaps-bilat mastectomies  . BTL    . CARPAL TUNNEL RELEASE  2011   both hands  . CARPOMETACARPEL SUSPENSION PLASTY Right 08/29/2014   Procedure: REMOVAL DERMASPAN SUSPENSIONPLASTY RIGHT THUMB ABDUCTOR POLLIS TRANSFER;  Surgeon: Daryll Brod, MD;  Location: [place];  Service: Orthopedics;  Laterality: Right;  . COLONOSCOPY W/ POLYPECTOMY  4/03   tubulovillous adenoma  . FINGER ARTHROSCOPY WITH CARPOMETACARPEL [facility]) ARTHROPLASTY Right 06/20/2014   Procedure: ARTHROSCOPY RIGHT THUMB CARPOMETACARPEL GRAFT JACKET INTERPOSITION PARTIAL TRAPEZIECTOMY POSSIBLE OPEN;  Surgeon: Daryll Brod, MD;  Location: [place];  Service: Orthopedics;  Laterality: Right;  . GANGLION CYST EXCISION     both [phone]  . hysterestomy  2005  . MASTECTOMY Bilateral 2008   . post- a -cath  2008  . TRIGGER FINGER RELEASE Right 08/29/2014   Procedure: RELEASE TRIGGER FINGER/A-1 PULLEY;  Surgeon: Daryll Brod, MD;  Location: [place];  Service: Orthopedics;  Laterality: Right;  . wisdom teeth out      Family History  Problem Relation Age of Onset  . Cancer Mother     breast  . Cancer Sister     breast  . Cancer Maternal Aunt     breast  . Colon cancer Maternal Grandmother     age unknown, ? 20's  . Colon cancer Cousin    Social History:  reports that she has been smoking Cigarettes.  She has been smoking about 0.25 packs per day. She has never used smokeless tobacco. She reports that she drinks alcohol. She reports that she does not use drugs.  Allergies:  Allergies  Allergen Reactions  . Sulfonamide Derivatives Hives and Other (See Comments)    FEVER    Medications Prior to Admission  Medication Sig Dispense Refill  . Alpha-Lipoic Acid 600 MG CAPS Take 600 mg by mouth daily.    . ARIPiprazole (ABILIFY) 20 MG tablet Take 20 mg by mouth daily.    . B Complex-C (B-COMPLEX WITH VITAMIN C) tablet Take 1 tablet by mouth daily.    . benztropine (COGENTIN) 1 MG tablet Take 1 mg by mouth at bedtime.     Marland Kitchen escitalopram (LEXAPRO) 20 MG tablet Take 20 mg by mouth daily.    Marland Kitchen  LYRICA 100 MG capsule Take 100 mg by mouth 2 (two) times daily.     . Melatonin 5 MG CAPS Take 5 mg by mouth at bedtime.    . methylphenidate (RITALIN LA) 30 MG 24 hr capsule Take 30 mg by mouth daily.     . Multiple Vitamins-Minerals (MULTIVITAMIN WITH MINERALS) tablet Take 1 tablet by mouth daily.    . Naloxegol Oxalate (MOVANTIK PO) Take by mouth daily.    Gean Birchwood ER 250 MG TB12 Take 250 mg by mouth 2 (two) times daily.     Marland Kitchen oxyCODONE-acetaminophen (PERCOCET/ROXICET) 5-325 MG per tablet Take 1 tablet by mouth every 4 (four) hours as needed for severe pain.     Marland Kitchen propranolol (INDERAL) 20 MG tablet Take 20 mg by mouth 3 (three) times daily as needed (for anxiety.).     Marland Kitchen propranolol ER (INDERAL LA) 120 MG 24 hr capsule Take 120 mg by mouth 2 (two) times daily. Takes for anxiety    . naproxen sodium (ANAPROX) 220 MG tablet Take 440 mg by mouth 2 (two) times daily as needed (for pain/headache.).      No results found for this or any previous visit (from the past 48 hour(s)). No results found.  Review of Systems  All other systems reviewed and are negative.   Blood pressure (!) 99/51, pulse 69, temperature 97.8 F (36.6 C), temperature source Oral, resp. rate 20, SpO2 96 %. Physical Exam  Constitutional: She is oriented to person, place, and time. She appears well-developed and well-nourished.  HENT:  Head: Atraumatic.  Eyes: EOM are normal.  Cardiovascular: Intact distal pulses.   Respiratory: Effort normal.  Musculoskeletal:  L shoulder pain with limited ROM. NVID  Neurological: She is alert and oriented to person, place, and time.  Skin: Skin is warm and dry.  Psychiatric: She has a normal mood and affect.     Assessment/Plan Endstage L shoulder arthritis with significant pain and dysfunction, failed conservative measures of injections, medication and arthroscopic debridement.  Pain interferes with sleep and quality of life. Plan L total shoulder replacement Risks / benefits of surgery discussed Consent on chart  NPO for OR Preop antibiotics   Nita Sells, MD 01/13/2017, 7:16 AM

## 2017-01-14 ENCOUNTER — Encounter (HOSPITAL_COMMUNITY): Payer: Self-pay | Admitting: Orthopedic Surgery

## 2017-01-14 LAB — BASIC METABOLIC PANEL
ANION GAP: 7 (ref 5–15)
BUN: 7 mg/dL (ref 6–20)
CO2: 27 mmol/L (ref 22–32)
CREATININE: 0.73 mg/dL (ref 0.44–1.00)
Calcium: 8.4 mg/dL — ABNORMAL LOW (ref 8.9–10.3)
Chloride: 107 mmol/L (ref 101–111)
GFR calc Af Amer: >60 mL/min (ref >60)
GLUCOSE: 152 mg/dL — AB (ref 65–99)
Potassium: 3.5 mmol/L (ref 3.5–5.1)
Sodium: 141 mmol/L (ref 135–145)

## 2017-01-14 LAB — CBC
HEMATOCRIT: 33.7 % — AB (ref 36.0–46.0)
Hemoglobin: 10.7 g/dL — ABNORMAL LOW (ref 12.0–15.0)
MCH: 27.1 pg (ref 26.0–34.0)
MCHC: 31.8 g/dL (ref 30.0–36.0)
MCV: 85.3 fL (ref 78.0–100.0)
PLATELETS: 208 10*3/uL (ref 150–400)
RBC: 3.95 MIL/uL (ref 3.87–5.11)
RDW: 13.3 % (ref 11.5–15.5)
WBC: 11.6 10*3/uL — AB (ref 4.0–10.5)

## 2017-01-14 MED ORDER — OXYCODONE HCL 5 MG PO TABS: ORAL_TABLET | ORAL | 0 refills | Status: DC

## 2017-01-14 MED ORDER — PNEUMOCOCCAL VAC POLYVALENT 25 MCG/0.5ML IJ INJ: 0.5000 mL | INJECTION | INTRAMUSCULAR | Status: DC

## 2017-01-14 MED ORDER — INFLUENZA VAC SPLIT QUAD 0.5 ML IM SUSY: 0.5000 mL | PREFILLED_SYRINGE | INTRAMUSCULAR | Status: DC

## 2017-01-14 NOTE — Evaluation (Signed)
Occupational Therapy Evaluation Patient Details Name: [name] MRN: [id number] DOB: 1960/12/01 Today's Date: 01/14/2017    History of Present Illness 56 yo female s/p L TSA    Clinical Impression  Past Medical History:  Diagnosis Date  . Anxiety   . Arthritis   . Breast cancer ([place])    rt breast  and left breast breast removed  . Breast reconstruction deformity   . Complication of anesthesia    shaking-after mastectomies-resp and hr dropped  . Depression, major    suicidal thoughts -implants, mental health, 05/11  . Fibromyalgia   . Headache(784.0)   . Hypersomnia    sleep attacks  . Hypothyroidism   . Migraine headache   . Narcolepsy   . Neoplasm    malignant breast, history of status post bilateral  . Post-operative nausea and vomiting   . Sleep apnea    CPAP  . Sleep apnea with use of continuous positive airway pressure (CPAP)    AHI 20 in HST, titration to 6 cm water - residual AHi 6 . remaining hypersomnic.     Patient is s/p L TSA  surgery resulting in functional limitations due to the deficits listed below (see OT problem list). PTA was independent with all adls.  Patient will benefit from skilled OT acutely to increase independence and safety with ADLS to allow discharge home without follow..     Follow Up Recommendations  No OT follow up    Equipment Recommendations  None recommended by OT    Recommendations for Other Services       Precautions / Restrictions Precautions Precautions: Shoulder Type of Shoulder Precautions: passive chandler protocol Shoulder Interventions: Shoulder sling/immobilizer;Off for dressing/bathing/exercises Precaution Comments: provided FF90 and ER neutral and shoulder d/c sheet Required Braces or Orthoses: Sling      Mobility Bed Mobility Overal bed mobility: Independent                Transfers Overall transfer level: Independent                    Balance                                             ADL Overall ADL's : Needs assistance/impaired Eating/Feeding: Set up   Grooming: Oral care;Wash/dry face;Set up           Upper Body Dressing : Supervision/safety   Lower Body Dressing: Supervision/safety   Toilet Transfer: Supervision/safety             General ADL Comments: pt needs incr cues for for safety and cues for no L UE use. pt wants to elevate L UE and needs cues to only move at elbow     Vision Baseline Vision/History: No visual deficits Vision Assessment?: No apparent visual deficits     Perception     Praxis      Pertinent Vitals/Pain Pain Assessment: 0-10 Pain Score: 7  Pain Location: L UE  Pain Descriptors / Indicators: Sore;Operative site guarding Pain Intervention(s): Repositioned;Premedicated before session;Monitored during session;Other (comment) (encouraged to take oral PO meds)     Hand Dominance Right   Extremity/Trunk Assessment Upper Extremity Assessment Upper Extremity Assessment: LUE deficits/detail LUE Deficits / Details: NWB FF 90 degrees ER Neurtral NO ABDUCTION   Lower Extremity Assessment Lower Extremity Assessment: Overall WFL for tasks assessed  Cervical / Trunk Assessment Cervical / Trunk Assessment: Normal   Communication Communication Communication: No difficulties   Cognition Arousal/Alertness: Awake/alert Behavior During Therapy: WFL for tasks assessed/performed Overall Cognitive Status: Within Functional Limits for tasks assessed                     General Comments       Exercises Exercises: Other exercises Other Exercises Other Exercises: FF 0-60 degrees supine x10 Other Exercises: ER to neutral x 10 reps   Shoulder Instructions      Home Living Family/patient expects to be discharged to:: Private residence Living Arrangements: Spouse/significant other;Children Available Help at Discharge: Family;Available PRN/intermittently               Bathroom Shower/Tub:  Walk-in Psychologist, prison and probation services: Standard                Prior Functioning/Environment Level of Independence: Independent                 OT Problem List: Pain;Decreased activity tolerance;Impaired UE functional use      OT Treatment/Interventions: Self-care/ADL training;Therapeutic exercise;Neuromuscular education;DME and/or AE instruction;Therapeutic activities;Patient/family education;Balance training    OT Goals(Current goals can be found in the care plan section) Acute Rehab OT Goals OT Goal Formulation: With patient Time For Goal Achievement: 01/28/17 Potential to Achieve Goals: Good  OT Frequency: Min 2X/week   Barriers to D/C:            Co-evaluation              End of Session Nurse Communication: Mobility status;Precautions  Activity Tolerance: Patient tolerated treatment well Patient left: in bed;with call bell/phone within reach;with family/visitor present  OT Visit Diagnosis: Muscle weakness (generalized) (M62.81)                ADL either performed or assessed with clinical judgement  Time: 6945-0388 OT Time Calculation (min): 35 min Charges:  OT General Charges $OT Visit: 1 Procedure OT Evaluation $OT Eval Moderate Complexity: 1 Procedure OT Treatments $Self Care/Home Management : 8-22 mins G-Codes:      Jeri Modena   OTR/L Pager: [phone] Office: [phone] .   Parke Poisson B 01/14/2017, 9:46 AM

## 2017-01-14 NOTE — Progress Notes (Signed)
   PATIENT ID: [name]   1 Day Post-Op Procedure(s) (LRB): LEFT TOTAL SHOULDER ARTHROPLASTY (Left)  Subjective: Doing well, does not believe block has worn off yet. Some numbness in fingers still. Feels like she can go home today.  Objective:  Vitals:   01/13/17 1953 01/14/17 0500  BP: 108/60 107/73  Pulse: 85 77  Resp: 16   Temp: 98.1 F (36.7 C) 98 F (36.7 C)     L UE dressing c/d/i Wiggles fingers  Labs:  No results for input(s): HGB in the last 72 hours.No results for input(s): WBC, RBC, HCT, PLT in the last 72 hours.No results for input(s): NA, K, CL, CO2, BUN, CREATININE, GLUCOSE, CALCIUM in the last 72 hours.  Assessment and Plan: 1 day s/p left TSA OT- PROM FF limited to 90 ER limited to 0 D/c home when cleared by OT and pain under control Script in chart Fu with Dr. Tamera Punt in 2 weeks  VTE proph: ASA, SCDs

## 2017-01-14 NOTE — Discharge Summary (Signed)
Patient ID: [name] MRN: [id number] DOB/AGE: [birth year] 56 y.o.  Admit date: 01/13/2017 Discharge date: 01/14/2017  Admission Diagnoses:  Principal Problem:   Osteoarthritis of left shoulder Active Problems:   S/P shoulder replacement, left   Discharge Diagnoses:  Same  Past Medical History:  Diagnosis Date  . Anxiety   . Arthritis   . Breast cancer ([place])    rt breast  and left breast breast removed  . Breast reconstruction deformity   . Complication of anesthesia    shaking-after mastectomies-resp and hr dropped  . Depression, major    suicidal thoughts -implants, mental health, 05/11  . Fibromyalgia   . Headache(784.0)   . Hypersomnia    sleep attacks  . Hypothyroidism   . Migraine headache   . Narcolepsy   . Neoplasm    malignant breast, history of status post bilateral  . Post-operative nausea and vomiting   . Sleep apnea    CPAP  . Sleep apnea with use of continuous positive airway pressure (CPAP)    AHI 20 in HST, titration to 6 cm water - residual AHi 6 . remaining hypersomnic.     Surgeries: Procedure(s): LEFT TOTAL SHOULDER ARTHROPLASTY on 01/13/2017   Consultants:   Discharged Condition: Improved  Hospital Course: [name] is an 56 y.o. female who was admitted 01/13/2017 for operative treatment ofOsteoarthritis of left shoulder. Patient has severe unremitting pain that affects sleep, daily activities, and work/hobbies. After pre-op clearance the patient was taken to the operating room on 01/13/2017 and underwent  Procedure(s): LEFT TOTAL SHOULDER ARTHROPLASTY.    Patient was given perioperative antibiotics: Anti-infectives    Start     Dose/Rate Route Frequency Ordered Stop   01/13/17 1400  ceFAZolin (ANCEF) IVPB 2g/100 mL premix     2 g 200 mL/hr over 30 Minutes Intravenous Every 6 hours 01/13/17 1135 01/14/17 0455   01/13/17 0700  ceFAZolin (ANCEF) IVPB 2g/100 mL premix     2 g 200 mL/hr over 30 Minutes Intravenous To ShortStay Surgical  01/12/17 0927 01/13/17 0750       Patient was given sequential compression devices, early ambulation, and asa to prevent DVT.  Patient benefited maximally from hospital stay and there were no complications.    Recent vital signs: Patient Vitals for the past 24 hrs:  BP Temp Temp src Pulse Resp SpO2 Height  01/14/17 0939 (!) 97/59 - - 78 - - -  01/14/17 0500 107/73 98 F (36.7 C) Oral 77 - 100 % -  01/13/17 1953 108/60 98.1 F (36.7 C) Oral 85 16 94 % -  01/13/17 1700 115/63 98.6 F (37 C) Oral 75 16 95 % -  01/13/17 1130 111/67 97.8 F (36.6 C) Oral 75 15 94 % 5\' 4"  (1.626 m)  01/13/17 1109 108/69 - - 76 15 91 % -  01/13/17 1103 - 97.8 F (36.6 C) - 78 16 93 % -  01/13/17 1100 - - - 80 15 93 % -  01/13/17 1054 105/65 - - - - - -  01/13/17 1042 105/69 - - 79 11 94 % -  01/13/17 1025 117/77 - - 80 12 93 % -     Recent laboratory studies:  Recent Labs  01/14/17 0817  WBC 11.6*  HGB 10.7*  HCT 33.7*  PLT 208  NA 141  K 3.5  CL 107  CO2 27  BUN 7  CREATININE 0.73  GLUCOSE 152*  CALCIUM 8.4*     Discharge Medications:  Allergies as of 01/14/2017      Reactions   Sulfonamide Derivatives Hives, Other (See Comments)   FEVER      Medication List    TAKE these medications   Alpha-Lipoic Acid 600 MG Caps Take 600 mg by mouth daily.   ARIPiprazole 20 MG tablet Commonly known as:  ABILIFY Take 20 mg by mouth daily.   B-complex with vitamin C tablet Take 1 tablet by mouth daily.   benztropine 1 MG tablet Commonly known as:  COGENTIN Take 1 mg by mouth at bedtime.   escitalopram 20 MG tablet Commonly known as:  LEXAPRO Take 20 mg by mouth daily.   LYRICA 100 MG capsule Generic drug:  pregabalin Take 100 mg by mouth 2 (two) times daily.   Melatonin 5 MG Caps Take 5 mg by mouth at bedtime.   methylphenidate 30 MG 24 hr capsule Commonly known as:  RITALIN LA Take 30 mg by mouth daily.   MOVANTIK PO Take by mouth daily.   multivitamin with minerals  tablet Take 1 tablet by mouth daily.   naproxen sodium 220 MG tablet Commonly known as:  ANAPROX Take 440 mg by mouth 2 (two) times daily as needed (for pain/headache.).   NUCYNTA ER 250 MG Tb12 Generic drug:  Tapentadol HCl Take 250 mg by mouth 2 (two) times daily.   oxyCODONE 5 MG immediate release tablet Commonly known as:  ROXICODONE Take 1-2 tablets by mouth every 4-6 hours as needed for pain   oxyCODONE-acetaminophen 5-325 MG tablet Commonly known as:  PERCOCET/ROXICET Take 1 tablet by mouth every 4 (four) hours as needed for severe pain.   propranolol ER 120 MG 24 hr capsule Commonly known as:  INDERAL LA Take 120 mg by mouth 2 (two) times daily. Takes for anxiety   propranolol 20 MG tablet Commonly known as:  INDERAL Take 20 mg by mouth 3 (three) times daily as needed (for anxiety.).       Diagnostic Studies: Dg Chest 2 View  Result Date: 01/07/2017 CLINICAL DATA:  Preoperative evaluation for total shoulder replacement. History of breast carcinoma EXAM: CHEST  2 VIEW COMPARISON:  Chest radiograph December 01, 2008 and chest CT February 25, 2009 FINDINGS: There is no edema or consolidation. Heart size and pulmonary vascularity are normal. No adenopathy. Postoperative changes noted over each breast region. No bone lesions evident. IMPRESSION: No edema or consolidation. Electronically Signed   By: Lowella Grip III M.D.   On: 01/07/2017 16:06   Dg Shoulder Left Port  Result Date: 01/13/2017 CLINICAL DATA:  Status post left shoulder arthroplasty. EXAM: LEFT SHOULDER - 1 VIEW COMPARISON:  None. FINDINGS: The left humeral component appears to be well situated. No fracture or dislocation is noted. Degenerative change involving left acromioclavicular joint is noted. Visualized ribs appear normal. IMPRESSION: Status post left shoulder arthroplasty. Electronically Signed   By: Marijo Conception, M.D.   On: 01/13/2017 10:31   Dg Foot 2 Views Right  Result Date: 01/03/2017 Please see  detailed radiograph report in office note.   Disposition: 01-Home or Self Care  Discharge Instructions    Call MD / Call 911    Complete by:  As directed    If you experience chest pain or shortness of breath, CALL 911 and be transported to the hospital emergency room.  If you develope a fever above 101 F, pus (white drainage) or increased drainage or redness at the wound, or calf pain, call your surgeon's office.   Constipation  Prevention    Complete by:  As directed    Drink plenty of fluids.  Prune juice may be helpful.  You may use a stool softener, such as Colace (over the counter) 100 mg twice a day.  Use MiraLax (over the counter) for constipation as needed.   Diet - low sodium heart healthy    Complete by:  As directed    Increase activity slowly as tolerated    Complete by:  As directed       Follow-up Information    Nita Sells, MD. Schedule an appointment as soon as possible for a visit in 2 weeks.   Specialty:  Orthopedic Surgery Contact information: [place] [id number] [phone]            Signed: Grier Mitts 01/14/2017, 10:21 AM

## 2017-01-14 NOTE — Progress Notes (Signed)
Discharge instructions printed and reviewed with patient and spouse, and copy given for them to take home. All questions addressed at this time. New prescriptions reviewed and paper copy given to get filled at her usual outpatient pharmacy. IV removed. Room searched for patient belongings, and confirmed with patient that all valuables were accounted for. Spouse assisted patient to dress, then staff escorted patient to discharge via wheelchair.

## 2017-02-02 ENCOUNTER — Ambulatory Visit: Payer: BLUE CROSS/BLUE SHIELD | Admitting: Podiatry

## 2017-02-07 ENCOUNTER — Ambulatory Visit: Payer: BLUE CROSS/BLUE SHIELD

## 2017-02-23 ENCOUNTER — Telehealth: Payer: Self-pay | Admitting: *Deleted

## 2017-02-23 NOTE — Telephone Encounter (Signed)
Patient is overdue for her recall colon. On 02/05/2015 patient came in for pre-visit but decided to cancel colon and make OV with PA for chronic constipation and narcolepsy. Patient was concerned that the prep would not work for her due to narcotic use. She is for pre-visit on 03/08/17, if needed may we use the 2 day prep? Please advise. Thank you, Nathanel Tallman pv

## 2017-02-23 NOTE — Telephone Encounter (Signed)
2 day prep for sure. Thanks

## 2017-02-23 NOTE — Telephone Encounter (Signed)
Noted! Thank you

## 2017-03-02 ENCOUNTER — Telehealth (HOSPITAL_COMMUNITY): Payer: Self-pay | Admitting: Internal Medicine

## 2017-03-02 NOTE — Telephone Encounter (Signed)
03/02/17 pt dropped an order off for therapy on her arm but it was for PT so I faxed asking for an OT order.  Patient called today asking about scheduling and I told her that as soon as we got the new order we would call her back to schedule.

## 2017-03-04 ENCOUNTER — Encounter: Payer: BLUE CROSS/BLUE SHIELD | Admitting: Podiatry

## 2017-03-07 NOTE — Progress Notes (Signed)
This encounter was created in error - please disregard.

## 2017-03-08 ENCOUNTER — Ambulatory Visit (HOSPITAL_COMMUNITY): Payer: BLUE CROSS/BLUE SHIELD | Attending: Orthopedic Surgery | Admitting: Occupational Therapy

## 2017-03-08 ENCOUNTER — Encounter (HOSPITAL_COMMUNITY): Payer: Self-pay | Admitting: Occupational Therapy

## 2017-03-08 DIAGNOSIS — R29898 Other symptoms and signs involving the musculoskeletal system: Secondary | ICD-10-CM | POA: Insufficient documentation

## 2017-03-08 DIAGNOSIS — M25612 Stiffness of left shoulder, not elsewhere classified: Secondary | ICD-10-CM | POA: Diagnosis present

## 2017-03-08 DIAGNOSIS — M25512 Pain in left shoulder: Secondary | ICD-10-CM | POA: Diagnosis present

## 2017-03-08 NOTE — Patient Instructions (Signed)
SHOULDER: Flexion On Table   Place hands on table, elbows straight. Move hips away from body. Press hands down into table.  _10-15__ reps per set, _2-3__ sets per day  Abduction (Passive)   With arm out to side, resting on table, lower head toward arm, keeping trunk away from table.  Repeat __10-15__ times. Do _2-3___ sessions per day.  Copyright  VHI. All rights reserved.     Internal Rotation (Assistive)   Seated with elbow bent at right angle and held against side, slide arm on table surface in an inward arc. Repeat _10-15___ times. Do _2-3___ sessions per day. Activity: Use this motion to brush crumbs off the table.  Copyright  VHI. All rights reserved.    

## 2017-03-08 NOTE — Therapy (Addendum)
[facility] [address] [place], [place], [address] Phone: [phone]   Fax:  [phone]  Occupational Therapy Evaluation  Patient Details  Name: [name] MRN: [id number] Date of Birth: 1961-09-30 Referring Provider: Dr. Tania Ade  Encounter Date: 03/08/2017      OT End of Session - 03/08/17 1210    Visit Number 1   Number of Visits 16   Date for OT Re-Evaluation 05/07/17   Authorization Type 1) Medicare part A  2) BCBS   Authorization Time Period Authorization-Visit Number Authorization-Number of Visits Before 10th visit; no visit limit; no copay 1 10   OT Start Time 0819   OT Stop Time 0852   OT Time Calculation (min) 33 min   Activity Tolerance Patient tolerated treatment well   Behavior During Therapy [facility] for tasks assessed/performed      Past Medical History:  Diagnosis Date  . Anxiety   . Arthritis   . Breast cancer ([place])    rt breast  and left breast breast removed  . Breast reconstruction deformity   . Complication of anesthesia    shaking-after mastectomies-resp and hr dropped  . Depression, major    suicidal thoughts -implants, mental health, 05/11  . Fibromyalgia   . Headache(784.0)   . Hypersomnia    sleep attacks  . Hypothyroidism   . Migraine headache   . Narcolepsy   . Neoplasm    malignant breast, history of status post bilateral  . Post-operative nausea and vomiting   . Sleep apnea    CPAP  . Sleep apnea with use of continuous positive airway pressure (CPAP)    AHI 20 in HST, titration to 6 cm water - residual AHi 6 . remaining hypersomnic.     Past Surgical History:  Procedure Laterality Date  . Breast reconstructioin  7/09   flaps-bilat mastectomies  . BTL    . CARPAL TUNNEL RELEASE  2011   both hands  . CARPOMETACARPEL SUSPENSION PLASTY Right 08/29/2014   Procedure: REMOVAL DERMASPAN SUSPENSIONPLASTY RIGHT THUMB ABDUCTOR POLLIS TRANSFER;  Surgeon: Daryll Brod, MD;   Location: [facility];  Service: Orthopedics;  Laterality: Right;  . COLONOSCOPY W/ POLYPECTOMY  4/03   tubulovillous adenoma  . FINGER ARTHROSCOPY WITH CARPOMETACARPEL [facility]) ARTHROPLASTY Right 06/20/2014   Procedure: ARTHROSCOPY RIGHT THUMB CARPOMETACARPEL GRAFT JACKET INTERPOSITION PARTIAL TRAPEZIECTOMY POSSIBLE OPEN;  Surgeon: Daryll Brod, MD;  Location: [facility];  Service: Orthopedics;  Laterality: Right;  . GANGLION CYST EXCISION     both [phone]  . hysterestomy  2005  . MASTECTOMY Bilateral 2008  . post- a -cath  2008  . TOTAL SHOULDER ARTHROPLASTY Left 01/13/2017   Procedure: LEFT TOTAL SHOULDER ARTHROPLASTY;  Surgeon: Tania Ade, MD;  Location: [facility];  Service: Orthopedics;  Laterality: Left;  LEFT TOTAL SHOULDER ARTHROPLASTY  . TRIGGER FINGER RELEASE Right 08/29/2014   Procedure: RELEASE TRIGGER FINGER/A-1 PULLEY;  Surgeon: Daryll Brod, MD;  Location: [facility];  Service: Orthopedics;  Laterality: Right;  . wisdom teeth out      There were no vitals filed for this visit.      Subjective Assessment - 03/08/17 1207    Subjective  S: I have a lot less pain now.    Pertinent History Pt is a 56 y/o female s/p left total shoulder arthroplasty on 01/14/2017. Pt has history of 2 arthroscopys in 2017 on left shoulder. Pt reports her shoulder was "bone on bone" and she  does not have the extensive pain that she did prior to the TSA. Dr. Tania Ade referred pt to occupational therapy for evaluation and treatment.    Limitations Pt has fibromyalgia-attends pain clinic   Patient Stated Goals To be able to use my left arm with no pain.    Currently in Pain? Yes   Pain Score 4    Pain Location Shoulder   Pain Orientation Left   Pain Descriptors / Indicators Aching   Pain Type Acute pain   Pain Radiating Towards none   Pain Onset More than a month ago   Pain Frequency Constant   Aggravating Factors  movement   Pain Relieving Factors  rest, pain medications   Effect of Pain on Daily Activities moderate effect           [facility] OT Assessment - 03/08/17 0809      Assessment   Diagnosis s/p left total shoulder arthroplasty   Referring Provider Dr. Tania Ade   Onset Date 01/14/17   Prior Therapy OT in 3/17 and 7/17 for left shoulder pain/arthroplasty     Precautions   Precautions Shoulder   Type of Shoulder Precautions Weeks 6-10 (4/20-5/18): Phase II ROM-full pain free P/ROM, begin AA/ROM and progress to A/ROM, scapular strengthening; [id number] (5/21-6/29): Phase III-strengthening     Balance Screen   Has the patient fallen in the past 6 months Yes   How many times? 1   Has the patient had a decrease in activity level because of a fear of falling?  No   Is the patient reluctant to leave their home because of a fear of falling?  No     Prior Function   Level of Independence Independent   Vocation On disability   Leisure gardening, walking dog     ADL   ADL comments Pt is having difficulty with dressing, grooming-washing/fixing, reaching overhead, reaching behind back, lifting items     Written Expression   Dominant Hand Right     Cognition   Overall Cognitive Status Within Functional Limits for tasks assessed     ROM / Strength   AROM / PROM / Strength PROM;AROM;Strength     Palpation   Palpation comment Mod fascial restrictions in left upper arm, trapezius, and scapularis regions     AROM   Overall AROM Comments Assessed seated, er/IR adducted   AROM Assessment Site Shoulder   Right/Left Shoulder Left   Left Shoulder Flexion 118 Degrees   Left Shoulder ABduction 71 Degrees   Left Shoulder Internal Rotation 90 Degrees   Left Shoulder External Rotation 41 Degrees     PROM   Overall PROM Comments Assessed supine, er/IR adducted   PROM Assessment Site Shoulder   Right/Left Shoulder Left   Left Shoulder Flexion 126 Degrees   Left Shoulder ABduction 72 Degrees   Left Shoulder Internal  Rotation 90 Degrees   Left Shoulder External Rotation 45 Degrees     Strength   Overall Strength Unable to assess;Due to precautions                         OT Education - 03/08/17 1209    Education provided Yes   Education Details table slides   Person(s) Educated Patient   Methods Explanation;Demonstration;Handout   Comprehension Verbalized understanding;Returned demonstration          OT Short Term Goals - 03/08/17 1214      OT SHORT TERM GOAL #1  Title Pt will be educated on and independent in HEP to improve LUE functional use during daily tasks.    Time 4   Period Weeks   Status New     OT SHORT TERM GOAL #2   Title Pt will decrease pain in LUE to 4/10 to improve ability to use LUE as assist during daily tasks.    Time 4   Period Weeks   Status New     OT SHORT TERM GOAL #3   Title Pt will decrease fascial restrictions in LUE from moderate to minimal amounts or less to increase LUE mobility.    Time 4   Period Weeks   Status New     OT SHORT TERM GOAL #4   Title Pt will improve LUE P/ROM to [facility] to improve ability to use LUE as assist during dressing tasks.    Time 4   Period Weeks   Status New     OT SHORT TERM GOAL #5   Title Pt will improve LUE strength to 3+/5 to improve ability to perform grooming tasks.    Time 4   Period Weeks   Status New           OT Long Term Goals - 03/08/17 1216      OT LONG TERM GOAL #1   Title Pt will return to prior level of functioning and independence with daily and leisure task completion.    Time 8   Period Weeks   Status New     OT LONG TERM GOAL #2   Title Pt will decrease pain in LUE to 2/10 or less to improve ability to perform functional task completion.    Time 8   Period Weeks   Status New     OT LONG TERM GOAL #3   Title Pt will decrease LUE fascial restrictions from min to trace amounts to improve mobility required for overhead reaching.    Time 8   Period Weeks   Status New      OT LONG TERM GOAL #4   Title Pt will increase LUE A/ROM to [facility] to improve ability to perform functional reaching tasks in all planes.    Time 8   Period Weeks   Status New     OT LONG TERM GOAL #5   Title Pt will improve strength in LUE to 4/5 to improve ability to perform gardening tasks.    Time 8   Period Weeks   Status New               Plan - 03/08/17 1210    Clinical Impression Statement A: Pt is a 56 y/o female s/p left TSA presenting with pain and ROM/strength deficits limiting functional use of LUE during daily tasks. Pt has been using LUE at home for tasks at waist level, working her way to shoulder level activities. Provided pt with table slide HEP this session, educated on precautions.    Rehab Potential Good   OT Frequency 2x / week   OT Duration 8 weeks   OT Treatment/Interventions Self-care/ADL training;Therapeutic exercise;Patient/family education;Ultrasound;Manual Therapy;Cryotherapy;Therapeutic activities;Electrical Stimulation;Moist Heat;Passive range of motion   Plan P: Pt will benefit from skilled OT services to decrease pain and fascial restrictions and increase joint range of motion and strength to improve functional use of LUE. Treatment plan: myofasical release, manual therapy, P/ROM, AA/ROM, A/ROM, scapular stability and strengthening, general LUE strengthening, modalities as needed.    OT Home Exercise Plan 5/1: table  slides   Consulted and Agree with Plan of Care Patient      Patient will benefit from skilled therapeutic intervention in order to improve the following deficits and impairments:  Decreased activity tolerance, Decreased strength, Impaired flexibility, Decreased range of motion, Pain, Impaired UE functional use, Increased fascial restricitons  Visit Diagnosis: Acute pain of left shoulder  Other symptoms and signs involving the musculoskeletal system  Stiffness of left shoulder, not elsewhere classified      G-Codes - 03-12-2017 1220     Functional Assessment Tool Used (Outpatient only) clinical judgement   Functional Limitation Carrying, moving and handling objects   Carrying, Moving and Handling Objects Current Status (V3710) At least 40 percent but less than 60 percent impaired, limited or restricted   Carrying, Moving and Handling Objects Goal Status (G2694) At least 20 percent but less than 40 percent impaired, limited or restricted      Problem List Patient Active Problem List   Diagnosis Date Noted  . Osteoarthritis of left shoulder 01/13/2017  . S/P shoulder replacement, left 01/13/2017  . OSA on CPAP 04/01/2015  . Subacute confusional state 04/01/2015  . Hallucinations, visual 04/01/2015  . Hypnapompic hallucinations 04/01/2015  . Sleep apnea with use of continuous positive airway pressure (CPAP)   . Hypersomnia, persistent 03/30/2013  . Obstructive sleep apnea (adult) (pediatric) 03/30/2013  . CONSTIPATION 11/18/2008  . RECTAL BLEEDING 11/18/2008  . HEMORRHOIDS, INTERNAL 11/15/2008  . DIVERTICULOSIS, COLON 11/15/2008  . CELLULITIS/ABSCESS, TRUNK 02/22/2008  . LOW BACK PAIN 02/22/2008  . DIZZINESS 02/22/2008  . FATIGUE 02/22/2008  . PALPITATIONS 02/22/2008  . MURMUR, CARDIAC, UNDIAGNOSED 02/22/2008  . BREAST CANCER, HX OF 02/22/2008  . COUGH 01/05/2008  . DEPRESSION 07/05/2007  . UTERINE POLYP 07/05/2007  . FIBROMYALGIA 07/05/2007  . COLONIC POLYPS, HX OF 07/05/2007  . HX, PERSONAL, CERVICAL DYSPLASIA 07/05/2007   Guadelupe Sabin, OTR/L  [phone] Mar 12, 2017, 12:21 PM  [facility] [address] [place], [place], [address] Phone: [phone]   Fax:  [phone]  Name: [name] MRN: [id number] Date of Birth: 1960/11/18

## 2017-03-09 ENCOUNTER — Ambulatory Visit (HOSPITAL_COMMUNITY): Payer: BLUE CROSS/BLUE SHIELD | Admitting: Occupational Therapy

## 2017-03-15 ENCOUNTER — Encounter (HOSPITAL_COMMUNITY): Payer: Self-pay | Admitting: Occupational Therapy

## 2017-03-15 ENCOUNTER — Ambulatory Visit (HOSPITAL_COMMUNITY): Payer: BLUE CROSS/BLUE SHIELD | Admitting: Occupational Therapy

## 2017-03-15 DIAGNOSIS — R29898 Other symptoms and signs involving the musculoskeletal system: Secondary | ICD-10-CM

## 2017-03-15 DIAGNOSIS — M25512 Pain in left shoulder: Secondary | ICD-10-CM | POA: Diagnosis not present

## 2017-03-15 DIAGNOSIS — M25612 Stiffness of left shoulder, not elsewhere classified: Secondary | ICD-10-CM

## 2017-03-15 NOTE — Therapy (Signed)
[facility] [place], [place], [address] Phone: [phone]   Fax:  [phone]  Occupational Therapy Treatment  Patient Details  Name: [name] MRN: [id number] Date of Birth: 23-Mar-1961 Referring Provider: Dr. Tania Ade  Encounter Date: 03/15/2017      OT End of Session - 03/15/17 0920    Visit Number 2   Number of Visits 16   Date for OT Re-Evaluation 05/07/17  mini reassessment 04/07/17   Authorization Type 1) Medicare part A  2) BCBS   Authorization Time Period Before 10th visit; no visit limit, no copay   Authorization - Visit Number 2   Authorization - Number of Visits 10   OT Start Time [phone]  pt arrived late   OT Stop Time 0901   OT Time Calculation (min) 38 min   Activity Tolerance Patient tolerated treatment well   Behavior During Therapy [facility] for tasks assessed/performed      Past Medical History:  Diagnosis Date  . Anxiety   . Arthritis   . Breast cancer ([place])    rt breast  and left breast breast removed  . Breast reconstruction deformity   . Complication of anesthesia    shaking-after mastectomies-resp and hr dropped  . Depression, major    suicidal thoughts -implants, mental health, 05/11  . Fibromyalgia   . Headache(784.0)   . Hypersomnia    sleep attacks  . Hypothyroidism   . Migraine headache   . Narcolepsy   . Neoplasm    malignant breast, history of status post bilateral  . Post-operative nausea and vomiting   . Sleep apnea    CPAP  . Sleep apnea with use of continuous positive airway pressure (CPAP)    AHI 20 in HST, titration to 6 cm water - residual AHi 6 . remaining hypersomnic.     Past Surgical History:  Procedure Laterality Date  . Breast reconstructioin  7/09   flaps-bilat mastectomies  . BTL    . CARPAL TUNNEL RELEASE  2011   both hands  . CARPOMETACARPEL SUSPENSION PLASTY Right 08/29/2014   Procedure: REMOVAL DERMASPAN SUSPENSIONPLASTY RIGHT THUMB ABDUCTOR  POLLIS TRANSFER;  Surgeon: Daryll Brod, MD;  Location: [facility];  Service: Orthopedics;  Laterality: Right;  . COLONOSCOPY W/ POLYPECTOMY  4/03   tubulovillous adenoma  . FINGER ARTHROSCOPY WITH CARPOMETACARPEL [facility]) ARTHROPLASTY Right 06/20/2014   Procedure: ARTHROSCOPY RIGHT THUMB CARPOMETACARPEL GRAFT JACKET INTERPOSITION PARTIAL TRAPEZIECTOMY POSSIBLE OPEN;  Surgeon: Daryll Brod, MD;  Location: [facility];  Service: Orthopedics;  Laterality: Right;  . GANGLION CYST EXCISION     both [phone]  . hysterestomy  2005  . MASTECTOMY Bilateral 2008  . post- a -cath  2008  . TOTAL SHOULDER ARTHROPLASTY Left 01/13/2017   Procedure: LEFT TOTAL SHOULDER ARTHROPLASTY;  Surgeon: Tania Ade, MD;  Location: [facility];  Service: Orthopedics;  Laterality: Left;  LEFT TOTAL SHOULDER ARTHROPLASTY  . TRIGGER FINGER RELEASE Right 08/29/2014   Procedure: RELEASE TRIGGER FINGER/A-1 PULLEY;  Surgeon: Daryll Brod, MD;  Location: [facility];  Service: Orthopedics;  Laterality: Right;  . wisdom teeth out      There were no vitals filed for this visit.      Subjective Assessment - 03/15/17 0823    Subjective  S: Reaching up is still painful.    Currently in Pain? Yes   Pain Score 4    Pain Location Shoulder   Pain Orientation Left  Pain Descriptors / Indicators Aching   Pain Type Acute pain   Pain Radiating Towards none   Pain Onset More than a month ago   Pain Frequency Constant   Aggravating Factors  movement   Pain Relieving Factors rest, pain medication   Effect of Pain on Daily Activities moderate effect            [facility] OT Assessment - 03/15/17 0823      Assessment   Diagnosis s/p left total shoulder arthroplasty     Precautions   Precautions Shoulder   Type of Shoulder Precautions Weeks 6-10 (4/20-5/18): Phase II ROM-full pain free P/ROM, begin AA/ROM and progress to A/ROM, scapular strengthening; [id number] (5/21-6/29): Phase  III-strengthening                  OT Treatments/Exercises (OP) - 03/15/17 0826      Exercises   Exercises Shoulder     Shoulder Exercises: Supine   Protraction PROM;AAROM;10 reps   Horizontal ABduction PROM;AAROM;10 reps   External Rotation PROM;AAROM;10 reps   Internal Rotation PROM;AAROM;10 reps   Flexion PROM;AAROM;10 reps   ABduction PROM;AAROM;10 reps     Shoulder Exercises: Seated   Elevation AROM;10 reps   Extension AROM;10 reps   Row AROM;10 reps     Shoulder Exercises: ROM/Strengthening   Thumb Tacks 1' low     Manual Therapy   Manual Therapy Myofascial release   Manual therapy comments Completed separately from therapeutic exercises   Myofascial Release Myofascial release to left upper arm, trapezius, and scapularis regions to decrease pain and fascial restrictions and increase joint range of motion.                 OT Education - 03/15/17 0855    Education provided Yes   Education Details scapular A/ROM   Person(s) Educated Patient   Methods Explanation;Demonstration;Handout   Comprehension Verbalized understanding;Returned demonstration          OT Short Term Goals - 03/15/17 0843      OT SHORT TERM GOAL #1   Title Pt will be educated on and independent in HEP to improve LUE functional use during daily tasks.    Time 4   Period Weeks   Status On-going     OT SHORT TERM GOAL #2   Title Pt will decrease pain in LUE to 4/10 to improve ability to use LUE as assist during daily tasks.    Time 4   Period Weeks   Status On-going     OT SHORT TERM GOAL #3   Title Pt will decrease fascial restrictions in LUE from moderate to minimal amounts or less to increase LUE mobility.    Time 4   Period Weeks   Status On-going     OT SHORT TERM GOAL #4   Title Pt will improve LUE P/ROM to [facility] to improve ability to use LUE as assist during dressing tasks.    Time 4   Period Weeks   Status On-going     OT SHORT TERM GOAL #5   Title Pt  will improve LUE strength to 3+/5 to improve ability to perform grooming tasks.    Time 4   Period Weeks   Status On-going           OT Long Term Goals - 03/15/17 0843      OT LONG TERM GOAL #1   Title Pt will return to prior level of functioning and independence with daily and leisure  task completion.    Time 8   Period Weeks   Status On-going     OT LONG TERM GOAL #2   Title Pt will decrease pain in LUE to 2/10 or less to improve ability to perform functional task completion.    Time 8   Period Weeks   Status On-going     OT LONG TERM GOAL #3   Title Pt will decrease LUE fascial restrictions from min to trace amounts to improve mobility required for overhead reaching.    Time 8   Period Weeks   Status On-going     OT LONG TERM GOAL #4   Title Pt will increase LUE A/ROM to [facility] to improve ability to perform functional reaching tasks in all planes.    Time 8   Period Weeks   Status On-going     OT LONG TERM GOAL #5   Title Pt will improve strength in LUE to 4/5 to improve ability to perform gardening tasks.    Time 8   Period Weeks   Status On-going               Plan - 03/15/17 8756    Clinical Impression Statement A: Initiated myofascial release, manual therapy, P/ROM, AA/ROM supine, and scapular A/ROM. Pt able to tolerate approximately 50% range, er is WNL. Verbal cuing for form and technique, occasional rest breaks due to fatigue.    Plan P: Update HEP for AA/ROM if appropriate, provide evaluation, add pulleys   OT Home Exercise Plan 5/1: table slides; 5/8: scapular A/ROM   Consulted and Agree with Plan of Care Patient      Patient will benefit from skilled therapeutic intervention in order to improve the following deficits and impairments:  Decreased activity tolerance, Decreased strength, Impaired flexibility, Decreased range of motion, Pain, Impaired UE functional use, Increased fascial restricitons  Visit Diagnosis: Acute pain of left  shoulder  Other symptoms and signs involving the musculoskeletal system  Stiffness of left shoulder, not elsewhere classified    Problem List Patient Active Problem List   Diagnosis Date Noted  . Osteoarthritis of left shoulder 01/13/2017  . S/P shoulder replacement, left 01/13/2017  . OSA on CPAP 04/01/2015  . Subacute confusional state 04/01/2015  . Hallucinations, visual 04/01/2015  . Hypnapompic hallucinations 04/01/2015  . Sleep apnea with use of continuous positive airway pressure (CPAP)   . Hypersomnia, persistent 03/30/2013  . Obstructive sleep apnea (adult) (pediatric) 03/30/2013  . CONSTIPATION 11/18/2008  . RECTAL BLEEDING 11/18/2008  . HEMORRHOIDS, INTERNAL 11/15/2008  . DIVERTICULOSIS, COLON 11/15/2008  . CELLULITIS/ABSCESS, TRUNK 02/22/2008  . LOW BACK PAIN 02/22/2008  . DIZZINESS 02/22/2008  . FATIGUE 02/22/2008  . PALPITATIONS 02/22/2008  . MURMUR, CARDIAC, UNDIAGNOSED 02/22/2008  . BREAST CANCER, HX OF 02/22/2008  . COUGH 01/05/2008  . DEPRESSION 07/05/2007  . UTERINE POLYP 07/05/2007  . FIBROMYALGIA 07/05/2007  . COLONIC POLYPS, HX OF 07/05/2007  . HX, PERSONAL, CERVICAL DYSPLASIA 07/05/2007   Guadelupe Sabin, OTR/L  [phone] 03/15/2017, 4:33 PM  [facility] [address] [place], [place], [address] Phone: [phone]   Fax:  [phone]  Name: ADELYNN GIPE MRN: [id number] Date of Birth: 1961-07-05

## 2017-03-15 NOTE — Patient Instructions (Signed)
1) Seated Row   Sit up straight with elbows by your sides. Pull back with shoulders/elbows, keeping forearms straight, as if pulling back on the reins of a horse. Squeeze shoulder blades together. Repeat _10__times, _1-3___sets/day    2) Shoulder Elevation    Sit up straight with arms by your sides. Slowly bring your shoulders up towards your ears. Repeat_10__times, _1-3___ sets/day    3) Shoulder Extension    Sit up straight with both arms by your side, draw your arms back behind your waist. Keep your elbows straight. Repeat __10__times, _1-3___sets/day.

## 2017-03-17 ENCOUNTER — Encounter (HOSPITAL_COMMUNITY): Payer: Self-pay | Admitting: Occupational Therapy

## 2017-03-17 ENCOUNTER — Ambulatory Visit (HOSPITAL_COMMUNITY): Payer: BLUE CROSS/BLUE SHIELD | Admitting: Occupational Therapy

## 2017-03-17 DIAGNOSIS — M25512 Pain in left shoulder: Secondary | ICD-10-CM

## 2017-03-17 DIAGNOSIS — M25612 Stiffness of left shoulder, not elsewhere classified: Secondary | ICD-10-CM

## 2017-03-17 DIAGNOSIS — R29898 Other symptoms and signs involving the musculoskeletal system: Secondary | ICD-10-CM

## 2017-03-17 NOTE — Therapy (Signed)
[place], [place], [address] Phone: [phone]   Fax:  [phone]  Occupational Therapy Treatment  Patient Details  Name: [name] MRN: [id number] Date of Birth: 1961/08/20 Referring Provider: Dr. Tania Ade  Encounter Date: 03/17/2017      OT End of Session - 03/17/17 1205    Visit Number 3   Number of Visits 16   Date for OT Re-Evaluation 05/07/17  mini reassessment 04/07/17   Authorization Type 1) Medicare part A  2) BCBS   Authorization Time Period Before 10th visit; no visit limit, no copay   Authorization - Visit Number 3   Authorization - Number of Visits 10   OT Start Time 1122   OT Stop Time 1202   OT Time Calculation (min) 40 min   Activity Tolerance Patient tolerated treatment well   Behavior During Therapy [facility] for tasks assessed/performed      Past Medical History:  Diagnosis Date  . Anxiety   . Arthritis   . Breast cancer ([place])    rt breast  and left breast breast removed  . Breast reconstruction deformity   . Complication of anesthesia    shaking-after mastectomies-resp and hr dropped  . Depression, major    suicidal thoughts -implants, mental health, 05/11  . Fibromyalgia   . Headache(784.0)   . Hypersomnia    sleep attacks  . Hypothyroidism   . Migraine headache   . Narcolepsy   . Neoplasm    malignant breast, history of status post bilateral  . Post-operative nausea and vomiting   . Sleep apnea    CPAP  . Sleep apnea with use of continuous positive airway pressure (CPAP)    AHI 20 in HST, titration to 6 cm water - residual AHi 6 . remaining hypersomnic.     Past Surgical History:  Procedure Laterality Date  . Breast reconstructioin  7/09   flaps-bilat mastectomies  . BTL    . CARPAL TUNNEL RELEASE  2011   both hands  . CARPOMETACARPEL SUSPENSION PLASTY Right 08/29/2014   Procedure: REMOVAL DERMASPAN SUSPENSIONPLASTY RIGHT THUMB ABDUCTOR POLLIS TRANSFER;   Surgeon: Daryll Brod, MD;  Location: [place];  Service: Orthopedics;  Laterality: Right;  . COLONOSCOPY W/ POLYPECTOMY  4/03   tubulovillous adenoma  . FINGER ARTHROSCOPY WITH CARPOMETACARPEL [facility]) ARTHROPLASTY Right 06/20/2014   Procedure: ARTHROSCOPY RIGHT THUMB CARPOMETACARPEL GRAFT JACKET INTERPOSITION PARTIAL TRAPEZIECTOMY POSSIBLE OPEN;  Surgeon: Daryll Brod, MD;  Location: [place];  Service: Orthopedics;  Laterality: Right;  . GANGLION CYST EXCISION     both [phone]  . hysterestomy  2005  . MASTECTOMY Bilateral 2008  . post- a -cath  2008  . TOTAL SHOULDER ARTHROPLASTY Left 01/13/2017   Procedure: LEFT TOTAL SHOULDER ARTHROPLASTY;  Surgeon: Tania Ade, MD;  Location: [place];  Service: Orthopedics;  Laterality: Left;  LEFT TOTAL SHOULDER ARTHROPLASTY  . TRIGGER FINGER RELEASE Right 08/29/2014   Procedure: RELEASE TRIGGER FINGER/A-1 PULLEY;  Surgeon: Daryll Brod, MD;  Location: [place];  Service: Orthopedics;  Laterality: Right;  . wisdom teeth out      There were no vitals filed for this visit.      Subjective Assessment - 03/17/17 1122    Subjective  S: I haven't had a chance to do many of my exercises.    Currently in Pain? Yes   Pain Score 4    Pain Location Shoulder   Pain Orientation Left  Pain Descriptors / Indicators Aching   Pain Type Acute pain   Pain Radiating Towards none   Pain Onset More than a month ago   Pain Frequency Constant   Aggravating Factors  movement   Pain Relieving Factors rest, pain medication   Effect of Pain on Daily Activities moderate effect             [facility] OT Assessment - 03/17/17 1122      Assessment   Diagnosis s/p left total shoulder arthroplasty     Precautions   Precautions Shoulder   Type of Shoulder Precautions Weeks 6-10 (4/20-5/18): Phase II ROM-full pain free P/ROM, begin AA/ROM and progress to A/ROM, scapular strengthening; [id number] (5/21-6/29): Phase  III-strengthening                  OT Treatments/Exercises (OP) - 03/17/17 1125      Exercises   Exercises Shoulder     Shoulder Exercises: Supine   Protraction PROM;AAROM;10 reps   Horizontal ABduction PROM;AAROM;10 reps   External Rotation PROM;AAROM;10 reps   Internal Rotation PROM;AAROM;10 reps   Flexion PROM;AAROM;10 reps   ABduction PROM;AAROM;10 reps     Shoulder Exercises: Seated   Elevation AROM;10 reps   Extension AROM;10 reps   Row AROM;10 reps     Shoulder Exercises: Pulleys   Flexion 1 minute   ABduction 1 minute     Shoulder Exercises: ROM/Strengthening   Thumb Tacks 1' low     Manual Therapy   Manual Therapy Myofascial release   Manual therapy comments Completed separately from therapeutic exercises   Myofascial Release Myofascial release to left upper arm, trapezius, and scapularis regions to decrease pain and fascial restrictions and increase joint range of motion.                 OT Education - 03/17/17 1145    Education provided Yes   Education Details provided evaluation and reviewed with pt   Person(s) Educated Patient   Methods Explanation;Demonstration;Handout   Comprehension Verbalized understanding;Returned demonstration          OT Short Term Goals - 03/15/17 0843      OT SHORT TERM GOAL #1   Title Pt will be educated on and independent in HEP to improve LUE functional use during daily tasks.    Time 4   Period Weeks   Status On-going     OT SHORT TERM GOAL #2   Title Pt will decrease pain in LUE to 4/10 to improve ability to use LUE as assist during daily tasks.    Time 4   Period Weeks   Status On-going     OT SHORT TERM GOAL #3   Title Pt will decrease fascial restrictions in LUE from moderate to minimal amounts or less to increase LUE mobility.    Time 4   Period Weeks   Status On-going     OT SHORT TERM GOAL #4   Title Pt will improve LUE P/ROM to [facility] to improve ability to use LUE as assist during  dressing tasks.    Time 4   Period Weeks   Status On-going     OT SHORT TERM GOAL #5   Title Pt will improve LUE strength to 3+/5 to improve ability to perform grooming tasks.    Time 4   Period Weeks   Status On-going           OT Long Term Goals - 03/15/17 [phone]  OT LONG TERM GOAL #1   Title Pt will return to prior level of functioning and independence with daily and leisure task completion.    Time 8   Period Weeks   Status On-going     OT LONG TERM GOAL #2   Title Pt will decrease pain in LUE to 2/10 or less to improve ability to perform functional task completion.    Time 8   Period Weeks   Status On-going     OT LONG TERM GOAL #3   Title Pt will decrease LUE fascial restrictions from min to trace amounts to improve mobility required for overhead reaching.    Time 8   Period Weeks   Status On-going     OT LONG TERM GOAL #4   Title Pt will increase LUE A/ROM to [facility] to improve ability to perform functional reaching tasks in all planes.    Time 8   Period Weeks   Status On-going     OT LONG TERM GOAL #5   Title Pt will improve strength in LUE to 4/5 to improve ability to perform gardening tasks.    Time 8   Period Weeks   Status On-going               Plan - 03/17/17 1205    Clinical Impression Statement A: Continued with AA/ROM, adding pulleys this session. Pt requires intermittent rest breaks due to fatigue, verbal cuing for form. Pt with increased fascial restrictions along medial scapular border today. Pt did not complete HEP over weekend, however was using LUE during daily activities.    Plan P: Complete AA/ROM seated, update HEP for AA/ROM. Add wall wash   OT Home Exercise Plan 5/1: table slides; 5/8: scapular A/ROM   Consulted and Agree with Plan of Care Patient      Patient will benefit from skilled therapeutic intervention in order to improve the following deficits and impairments:  Decreased activity tolerance, Decreased strength,  Impaired flexibility, Decreased range of motion, Pain, Impaired UE functional use, Increased fascial restricitons  Visit Diagnosis: Acute pain of left shoulder  Other symptoms and signs involving the musculoskeletal system  Stiffness of left shoulder, not elsewhere classified    Problem List Patient Active Problem List   Diagnosis Date Noted  . Osteoarthritis of left shoulder 01/13/2017  . S/P shoulder replacement, left 01/13/2017  . OSA on CPAP 04/01/2015  . Subacute confusional state 04/01/2015  . Hallucinations, visual 04/01/2015  . Hypnapompic hallucinations 04/01/2015  . Sleep apnea with use of continuous positive airway pressure (CPAP)   . Hypersomnia, persistent 03/30/2013  . Obstructive sleep apnea (adult) (pediatric) 03/30/2013  . CONSTIPATION 11/18/2008  . RECTAL BLEEDING 11/18/2008  . HEMORRHOIDS, INTERNAL 11/15/2008  . DIVERTICULOSIS, COLON 11/15/2008  . CELLULITIS/ABSCESS, TRUNK 02/22/2008  . LOW BACK PAIN 02/22/2008  . DIZZINESS 02/22/2008  . FATIGUE 02/22/2008  . PALPITATIONS 02/22/2008  . MURMUR, CARDIAC, UNDIAGNOSED 02/22/2008  . BREAST CANCER, HX OF 02/22/2008  . COUGH 01/05/2008  . DEPRESSION 07/05/2007  . UTERINE POLYP 07/05/2007  . FIBROMYALGIA 07/05/2007  . COLONIC POLYPS, HX OF 07/05/2007  . HX, PERSONAL, CERVICAL DYSPLASIA 07/05/2007   Guadelupe Sabin, OTR/L  [phone] 03/17/2017, 12:08 PM  [place] [address] [place], [place], [address] Phone: [phone]   Fax:  [phone]  Name: [name] MRN: [id number] Date of Birth: 07/03/1961

## 2017-03-22 ENCOUNTER — Encounter: Payer: Medicare Other | Admitting: Internal Medicine

## 2017-03-23 ENCOUNTER — Ambulatory Visit (HOSPITAL_COMMUNITY): Payer: BLUE CROSS/BLUE SHIELD

## 2017-03-23 ENCOUNTER — Telehealth (HOSPITAL_COMMUNITY): Payer: Self-pay | Admitting: Internal Medicine

## 2017-03-23 ENCOUNTER — Telehealth (HOSPITAL_COMMUNITY): Payer: Self-pay

## 2017-03-23 NOTE — Telephone Encounter (Signed)
Called patient regarding no show. Left message reminding patient of next appointment and call if she unable to attend.  Ailene Ravel, OTR/L,CBIS  [phone]

## 2017-03-23 NOTE — Telephone Encounter (Signed)
03/23/17 pt left a message at 8:50 to cx today's appt.  She said she wasn't feeling well

## 2017-03-25 ENCOUNTER — Encounter (HOSPITAL_COMMUNITY): Payer: Self-pay

## 2017-03-25 ENCOUNTER — Ambulatory Visit (HOSPITAL_COMMUNITY): Payer: BLUE CROSS/BLUE SHIELD

## 2017-03-25 DIAGNOSIS — R29898 Other symptoms and signs involving the musculoskeletal system: Secondary | ICD-10-CM

## 2017-03-25 DIAGNOSIS — M25612 Stiffness of left shoulder, not elsewhere classified: Secondary | ICD-10-CM

## 2017-03-25 DIAGNOSIS — M25512 Pain in left shoulder: Secondary | ICD-10-CM

## 2017-03-25 NOTE — Patient Instructions (Signed)

## 2017-03-25 NOTE — Therapy (Signed)
[place], [place], [address] Phone: [phone]   Fax:  [phone]  Occupational Therapy Treatment  Patient Details  Name: [name] MRN: [id number] Date of Birth: December 03, 1960 Referring Provider: Dr. Tania Ade  Encounter Date: 03/25/2017      OT End of Session - 03/25/17 1033    Visit Number 4   Number of Visits 16   Date for OT Re-Evaluation 05/07/17  mini reassessment 04/07/17   Authorization Type 1) Medicare part A  2) BCBS   Authorization Time Period Before 10th visit; no visit limit, no copay   Authorization - Visit Number 4   Authorization - Number of Visits 10   OT Start Time [phone]  Pt arrived late   OT Stop Time 0945   OT Time Calculation (min) 36 min   Activity Tolerance Patient tolerated treatment well   Behavior During Therapy [facility] for tasks assessed/performed      Past Medical History:  Diagnosis Date  . Anxiety   . Arthritis   . Breast cancer ([place])    rt breast  and left breast breast removed  . Breast reconstruction deformity   . Complication of anesthesia    shaking-after mastectomies-resp and hr dropped  . Depression, major    suicidal thoughts -implants, mental health, 05/11  . Fibromyalgia   . Headache(784.0)   . Hypersomnia    sleep attacks  . Hypothyroidism   . Migraine headache   . Narcolepsy   . Neoplasm    malignant breast, history of status post bilateral  . Post-operative nausea and vomiting   . Sleep apnea    CPAP  . Sleep apnea with use of continuous positive airway pressure (CPAP)    AHI 20 in HST, titration to 6 cm water - residual AHi 6 . remaining hypersomnic.     Past Surgical History:  Procedure Laterality Date  . Breast reconstructioin  7/09   flaps-bilat mastectomies  . BTL    . CARPAL TUNNEL RELEASE  2011   both hands  . CARPOMETACARPEL SUSPENSION PLASTY Right 08/29/2014   Procedure: REMOVAL DERMASPAN SUSPENSIONPLASTY RIGHT THUMB ABDUCTOR  POLLIS TRANSFER;  Surgeon: Daryll Brod, MD;  Location: [place];  Service: Orthopedics;  Laterality: Right;  . COLONOSCOPY W/ POLYPECTOMY  4/03   tubulovillous adenoma  . FINGER ARTHROSCOPY WITH CARPOMETACARPEL [facility]) ARTHROPLASTY Right 06/20/2014   Procedure: ARTHROSCOPY RIGHT THUMB CARPOMETACARPEL GRAFT JACKET INTERPOSITION PARTIAL TRAPEZIECTOMY POSSIBLE OPEN;  Surgeon: Daryll Brod, MD;  Location: [place];  Service: Orthopedics;  Laterality: Right;  . GANGLION CYST EXCISION     both [phone]  . hysterestomy  2005  . MASTECTOMY Bilateral 2008  . post- a -cath  2008  . TOTAL SHOULDER ARTHROPLASTY Left 01/13/2017   Procedure: LEFT TOTAL SHOULDER ARTHROPLASTY;  Surgeon: Tania Ade, MD;  Location: [place];  Service: Orthopedics;  Laterality: Left;  LEFT TOTAL SHOULDER ARTHROPLASTY  . TRIGGER FINGER RELEASE Right 08/29/2014   Procedure: RELEASE TRIGGER FINGER/A-1 PULLEY;  Surgeon: Daryll Brod, MD;  Location: [place];  Service: Orthopedics;  Laterality: Right;  . wisdom teeth out      There were no vitals filed for this visit.      Subjective Assessment - 03/25/17 0935    Subjective  S: My arm cramps sometimes with the exercises. It happened last time and she [name]) had to stretch and pull on it.    Currently in Pain? Yes  Pain Score 5    Pain Location Shoulder   Pain Orientation Left   Pain Descriptors / Indicators Constant;Stabbing   Pain Type Acute pain   Pain Radiating Towards towards neck   Pain Onset More than a month ago   Pain Frequency Constant   Aggravating Factors  movement and use   Pain Relieving Factors rest, pain medication   Effect of Pain on Daily Activities severe effect   Multiple Pain Sites No            [facility] OT Assessment - 03/25/17 0941      Assessment   Diagnosis s/p left total shoulder arthroplasty     Precautions   Precautions Shoulder   Type of Shoulder Precautions Weeks 6-10 (4/20-5/18): Phase  II ROM-full pain free P/ROM, begin AA/ROM and progress to A/ROM, scapular strengthening; [id number] (5/21-6/29): Phase III-strengthening                  OT Treatments/Exercises (OP) - 03/25/17 0941      Exercises   Exercises Shoulder     Shoulder Exercises: Supine   Protraction PROM;AAROM;10 reps   Horizontal ABduction PROM;AAROM;10 reps   External Rotation PROM;AAROM;10 reps   Internal Rotation PROM;AAROM;10 reps   Flexion PROM;AAROM;10 reps   ABduction PROM;AAROM;10 reps     Manual Therapy   Manual Therapy Myofascial release   Manual therapy comments Completed separately from therapeutic exercises   Myofascial Release Myofascial release to left upper arm, trapezius, and scapularis regions to decrease pain and fascial restrictions and increase joint range of motion.                 OT Education - 03/25/17 0940    Education provided Yes   Education Details AA/ROM shoulder exercises   Person(s) Educated Patient   Methods Explanation;Demonstration;Verbal cues;Handout   Comprehension Returned demonstration;Verbalized understanding          OT Short Term Goals - 03/15/17 0843      OT SHORT TERM GOAL #1   Title Pt will be educated on and independent in HEP to improve LUE functional use during daily tasks.    Time 4   Period Weeks   Status On-going     OT SHORT TERM GOAL #2   Title Pt will decrease pain in LUE to 4/10 to improve ability to use LUE as assist during daily tasks.    Time 4   Period Weeks   Status On-going     OT SHORT TERM GOAL #3   Title Pt will decrease fascial restrictions in LUE from moderate to minimal amounts or less to increase LUE mobility.    Time 4   Period Weeks   Status On-going     OT SHORT TERM GOAL #4   Title Pt will improve LUE P/ROM to [facility] to improve ability to use LUE as assist during dressing tasks.    Time 4   Period Weeks   Status On-going     OT SHORT TERM GOAL #5   Title Pt will improve LUE strength to  3+/5 to improve ability to perform grooming tasks.    Time 4   Period Weeks   Status On-going           OT Long Term Goals - 03/15/17 0843      OT LONG TERM GOAL #1   Title Pt will return to prior level of functioning and independence with daily and leisure task completion.    Time 8  Period Weeks   Status On-going     OT LONG TERM GOAL #2   Title Pt will decrease pain in LUE to 2/10 or less to improve ability to perform functional task completion.    Time 8   Period Weeks   Status On-going     OT LONG TERM GOAL #3   Title Pt will decrease LUE fascial restrictions from min to trace amounts to improve mobility required for overhead reaching.    Time 8   Period Weeks   Status On-going     OT LONG TERM GOAL #4   Title Pt will increase LUE A/ROM to [facility] to improve ability to perform functional reaching tasks in all planes.    Time 8   Period Weeks   Status On-going     OT LONG TERM GOAL #5   Title Pt will improve strength in LUE to 4/5 to improve ability to perform gardening tasks.    Time 8   Period Weeks   Status On-going               Plan - 03/25/17 1034    Clinical Impression Statement A: Patient reports increased tenderness in her upper trapezius region. Responded well to manual techniques. VC for form technique.    Plan P: Complete AA/ROM seated. Follow up on HEP.      Patient will benefit from skilled therapeutic intervention in order to improve the following deficits and impairments:  Decreased activity tolerance, Decreased strength, Impaired flexibility, Decreased range of motion, Pain, Impaired UE functional use, Increased fascial restricitons  Visit Diagnosis: Acute pain of left shoulder  Other symptoms and signs involving the musculoskeletal system  Stiffness of left shoulder, not elsewhere classified    Problem List Patient Active Problem List   Diagnosis Date Noted  . Osteoarthritis of left shoulder 01/13/2017  . S/P shoulder  replacement, left 01/13/2017  . OSA on CPAP 04/01/2015  . Subacute confusional state 04/01/2015  . Hallucinations, visual 04/01/2015  . Hypnapompic hallucinations 04/01/2015  . Sleep apnea with use of continuous positive airway pressure (CPAP)   . Hypersomnia, persistent 03/30/2013  . Obstructive sleep apnea (adult) (pediatric) 03/30/2013  . CONSTIPATION 11/18/2008  . RECTAL BLEEDING 11/18/2008  . HEMORRHOIDS, INTERNAL 11/15/2008  . DIVERTICULOSIS, COLON 11/15/2008  . CELLULITIS/ABSCESS, TRUNK 02/22/2008  . LOW BACK PAIN 02/22/2008  . DIZZINESS 02/22/2008  . FATIGUE 02/22/2008  . PALPITATIONS 02/22/2008  . MURMUR, CARDIAC, UNDIAGNOSED 02/22/2008  . BREAST CANCER, HX OF 02/22/2008  . COUGH 01/05/2008  . DEPRESSION 07/05/2007  . UTERINE POLYP 07/05/2007  . FIBROMYALGIA 07/05/2007  . COLONIC POLYPS, HX OF 07/05/2007  . HX, PERSONAL, CERVICAL DYSPLASIA 07/05/2007   Ailene Ravel, OTR/L,CBIS  [phone]  03/25/2017, 10:43 AM  [facility] [address] [place], [place], [address] Phone: [phone]   Fax:  [phone]  Name: [name] MRN: [id number] Date of Birth: 04/19/1961

## 2017-03-28 ENCOUNTER — Ambulatory Visit (HOSPITAL_COMMUNITY): Payer: BLUE CROSS/BLUE SHIELD | Admitting: Occupational Therapy

## 2017-03-28 ENCOUNTER — Encounter (HOSPITAL_COMMUNITY): Payer: Medicare Other | Admitting: Specialist

## 2017-03-28 ENCOUNTER — Encounter (HOSPITAL_COMMUNITY): Payer: Self-pay | Admitting: Occupational Therapy

## 2017-03-28 DIAGNOSIS — M25512 Pain in left shoulder: Secondary | ICD-10-CM | POA: Diagnosis not present

## 2017-03-28 DIAGNOSIS — R29898 Other symptoms and signs involving the musculoskeletal system: Secondary | ICD-10-CM

## 2017-03-28 DIAGNOSIS — M25612 Stiffness of left shoulder, not elsewhere classified: Secondary | ICD-10-CM

## 2017-03-28 NOTE — Therapy (Signed)
[place], [place], [address] Phone: [phone]   Fax:  [phone]  Occupational Therapy Treatment  Patient Details  Name: [name] MRN: [id number] Date of Birth: September 16, 1961 Referring Provider: Dr. Tania Ade  Encounter Date: 03/28/2017      OT End of Session - 03/28/17 1020    Visit Number 5   Number of Visits 16   Date for OT Re-Evaluation 05/07/17  mini reassessment 04/07/17   Authorization Type 1) Medicare part A  2) BCBS   Authorization Time Period Before 10th visit; no visit limit, no copay   Authorization - Visit Number 5   Authorization - Number of Visits 10   OT Start Time [phone]  pt arrived late   OT Stop Time 0900   OT Time Calculation (min) 38 min   Activity Tolerance Patient tolerated treatment well   Behavior During Therapy [facility] for tasks assessed/performed      Past Medical History:  Diagnosis Date  . Anxiety   . Arthritis   . Breast cancer ([place])    rt breast  and left breast breast removed  . Breast reconstruction deformity   . Complication of anesthesia    shaking-after mastectomies-resp and hr dropped  . Depression, major    suicidal thoughts -implants, mental health, 05/11  . Fibromyalgia   . Headache(784.0)   . Hypersomnia    sleep attacks  . Hypothyroidism   . Migraine headache   . Narcolepsy   . Neoplasm    malignant breast, history of status post bilateral  . Post-operative nausea and vomiting   . Sleep apnea    CPAP  . Sleep apnea with use of continuous positive airway pressure (CPAP)    AHI 20 in HST, titration to 6 cm water - residual AHi 6 . remaining hypersomnic.     Past Surgical History:  Procedure Laterality Date  . Breast reconstructioin  7/09   flaps-bilat mastectomies  . BTL    . CARPAL TUNNEL RELEASE  2011   both hands  . CARPOMETACARPEL SUSPENSION PLASTY Right 08/29/2014   Procedure: REMOVAL DERMASPAN SUSPENSIONPLASTY RIGHT THUMB ABDUCTOR  POLLIS TRANSFER;  Surgeon: Daryll Brod, MD;  Location: [place];  Service: Orthopedics;  Laterality: Right;  . COLONOSCOPY W/ POLYPECTOMY  4/03   tubulovillous adenoma  . FINGER ARTHROSCOPY WITH CARPOMETACARPEL [facility]) ARTHROPLASTY Right 06/20/2014   Procedure: ARTHROSCOPY RIGHT THUMB CARPOMETACARPEL GRAFT JACKET INTERPOSITION PARTIAL TRAPEZIECTOMY POSSIBLE OPEN;  Surgeon: Daryll Brod, MD;  Location: [place];  Service: Orthopedics;  Laterality: Right;  . GANGLION CYST EXCISION     both [phone]  . hysterestomy  2005  . MASTECTOMY Bilateral 2008  . post- a -cath  2008  . TOTAL SHOULDER ARTHROPLASTY Left 01/13/2017   Procedure: LEFT TOTAL SHOULDER ARTHROPLASTY;  Surgeon: Tania Ade, MD;  Location: [place];  Service: Orthopedics;  Laterality: Left;  LEFT TOTAL SHOULDER ARTHROPLASTY  . TRIGGER FINGER RELEASE Right 08/29/2014   Procedure: RELEASE TRIGGER FINGER/A-1 PULLEY;  Surgeon: Daryll Brod, MD;  Location: [place];  Service: Orthopedics;  Laterality: Right;  . wisdom teeth out      There were no vitals filed for this visit.      Subjective Assessment - 03/28/17 0823    Subjective  S: I may have overdone it this weekend.    Currently in Pain? Yes   Pain Score 5    Pain Location Shoulder   Pain Orientation Left  Pain Descriptors / Indicators Constant;Sore   Pain Type Acute pain   Pain Radiating Towards towards neck   Pain Onset More than a month ago   Pain Frequency Constant   Aggravating Factors  movement, use   Pain Relieving Factors rest, pain medication   Effect of Pain on Daily Activities severe effect   Multiple Pain Sites No            [facility] OT Assessment - 03/28/17 0823      Assessment   Diagnosis s/p left total shoulder arthroplasty     Precautions   Precautions Shoulder   Type of Shoulder Precautions Weeks 6-10 (4/20-5/18): Phase II ROM-full pain free P/ROM, begin AA/ROM and progress to A/ROM, scapular  strengthening; [id number] (5/21-6/29): Phase III-strengthening                  OT Treatments/Exercises (OP) - 03/28/17 0825      Exercises   Exercises Shoulder     Shoulder Exercises: Supine   Protraction PROM;5 reps   Horizontal ABduction PROM;5 reps   External Rotation PROM;5 reps   Internal Rotation PROM;5 reps   Flexion PROM;5 reps   ABduction PROM;5 reps     Shoulder Exercises: Seated   Protraction AAROM;10 reps   Horizontal ABduction AAROM;5 reps   External Rotation AAROM;10 reps   Internal Rotation AAROM;10 reps   Flexion AAROM;10 reps   Abduction AAROM;10 reps     Shoulder Exercises: Pulleys   Flexion 1 minute   ABduction 1 minute     Shoulder Exercises: ROM/Strengthening   Wall Wash 1'   Thumb Tacks 1'   Prot/Ret//Elev/Dep 1'     Manual Therapy   Manual Therapy Myofascial release   Manual therapy comments Completed separately from therapeutic exercises   Myofascial Release Myofascial release to left upper arm, trapezius, and scapularis regions to decrease pain and fascial restrictions and increase joint range of motion.                   OT Short Term Goals - 03/15/17 0843      OT SHORT TERM GOAL #1   Title Pt will be educated on and independent in HEP to improve LUE functional use during daily tasks.    Time 4   Period Weeks   Status On-going     OT SHORT TERM GOAL #2   Title Pt will decrease pain in LUE to 4/10 to improve ability to use LUE as assist during daily tasks.    Time 4   Period Weeks   Status On-going     OT SHORT TERM GOAL #3   Title Pt will decrease fascial restrictions in LUE from moderate to minimal amounts or less to increase LUE mobility.    Time 4   Period Weeks   Status On-going     OT SHORT TERM GOAL #4   Title Pt will improve LUE P/ROM to [facility] to improve ability to use LUE as assist during dressing tasks.    Time 4   Period Weeks   Status On-going     OT SHORT TERM GOAL #5   Title Pt will improve  LUE strength to 3+/5 to improve ability to perform grooming tasks.    Time 4   Period Weeks   Status On-going           OT Long Term Goals - 03/15/17 0843      OT LONG TERM GOAL #1   Title Pt will  return to prior level of functioning and independence with daily and leisure task completion.    Time 8   Period Weeks   Status On-going     OT LONG TERM GOAL #2   Title Pt will decrease pain in LUE to 2/10 or less to improve ability to perform functional task completion.    Time 8   Period Weeks   Status On-going     OT LONG TERM GOAL #3   Title Pt will decrease LUE fascial restrictions from min to trace amounts to improve mobility required for overhead reaching.    Time 8   Period Weeks   Status On-going     OT LONG TERM GOAL #4   Title Pt will increase LUE A/ROM to [facility] to improve ability to perform functional reaching tasks in all planes.    Time 8   Period Weeks   Status On-going     OT LONG TERM GOAL #5   Title Pt will improve strength in LUE to 4/5 to improve ability to perform gardening tasks.    Time 8   Period Weeks   Status On-going               Plan - 03/28/17 1020    Clinical Impression Statement A: Pt completed AA/ROM in sitting, rest breaks provided for fatigue. Added wall wash and prot/ret/elev/dep, pt completed thumb tacks at shoulder height. Verbal cuing for form and technique during exercises.    Plan P: Continue with AA/ROM seated, add therapy ball circles to begin working on IR   OT Home Exercise Plan 5/1: table slides; 5/8: scapular A/ROM   Consulted and Agree with Plan of Care Patient      Patient will benefit from skilled therapeutic intervention in order to improve the following deficits and impairments:  Decreased activity tolerance, Decreased strength, Impaired flexibility, Decreased range of motion, Pain, Impaired UE functional use, Increased fascial restricitons  Visit Diagnosis: Acute pain of left shoulder  Other symptoms and  signs involving the musculoskeletal system  Stiffness of left shoulder, not elsewhere classified    Problem List Patient Active Problem List   Diagnosis Date Noted  . Osteoarthritis of left shoulder 01/13/2017  . S/P shoulder replacement, left 01/13/2017  . OSA on CPAP 04/01/2015  . Subacute confusional state 04/01/2015  . Hallucinations, visual 04/01/2015  . Hypnapompic hallucinations 04/01/2015  . Sleep apnea with use of continuous positive airway pressure (CPAP)   . Hypersomnia, persistent 03/30/2013  . Obstructive sleep apnea (adult) (pediatric) 03/30/2013  . CONSTIPATION 11/18/2008  . RECTAL BLEEDING 11/18/2008  . HEMORRHOIDS, INTERNAL 11/15/2008  . DIVERTICULOSIS, COLON 11/15/2008  . CELLULITIS/ABSCESS, TRUNK 02/22/2008  . LOW BACK PAIN 02/22/2008  . DIZZINESS 02/22/2008  . FATIGUE 02/22/2008  . PALPITATIONS 02/22/2008  . MURMUR, CARDIAC, UNDIAGNOSED 02/22/2008  . BREAST CANCER, HX OF 02/22/2008  . COUGH 01/05/2008  . DEPRESSION 07/05/2007  . UTERINE POLYP 07/05/2007  . FIBROMYALGIA 07/05/2007  . COLONIC POLYPS, HX OF 07/05/2007  . HX, PERSONAL, CERVICAL DYSPLASIA 07/05/2007   Guadelupe Sabin, OTR/L  [phone] 03/28/2017, 10:23 AM  [facility] [address] [place], [place], [address] Phone: [phone]   Fax:  [phone]  Name: [name] MRN: [id number] Date of Birth: 01-Jan-1961

## 2017-03-30 ENCOUNTER — Encounter (HOSPITAL_COMMUNITY): Payer: Medicare Other

## 2017-03-31 ENCOUNTER — Ambulatory Visit (INDEPENDENT_AMBULATORY_CARE_PROVIDER_SITE_OTHER): Payer: BLUE CROSS/BLUE SHIELD

## 2017-03-31 ENCOUNTER — Encounter: Payer: Self-pay | Admitting: Podiatry

## 2017-03-31 ENCOUNTER — Ambulatory Visit (INDEPENDENT_AMBULATORY_CARE_PROVIDER_SITE_OTHER): Payer: BLUE CROSS/BLUE SHIELD | Admitting: Podiatry

## 2017-03-31 DIAGNOSIS — S92301D Fracture of unspecified metatarsal bone(s), right foot, subsequent encounter for fracture with routine healing: Secondary | ICD-10-CM | POA: Diagnosis not present

## 2017-03-31 DIAGNOSIS — M2011 Hallux valgus (acquired), right foot: Secondary | ICD-10-CM | POA: Diagnosis not present

## 2017-04-01 ENCOUNTER — Ambulatory Visit (HOSPITAL_COMMUNITY): Payer: BLUE CROSS/BLUE SHIELD

## 2017-04-02 NOTE — Progress Notes (Signed)
Subjective:    Patient ID: [name], female   DOB: 56 y.o.   MRN: [id number]   HPI patient presents stating that her right foot still does not feel good to her and while she can not to identify exact area she states it just is bothersome. She has had her shoulder replacement done in the last several months and is still going through physical therapy for this and still has some weakness in that arm    Review of Systems  All other systems reviewed and are negative.       Objective:  Physical Exam  Constitutional: She is oriented to person, place, and time.  Cardiovascular: Intact distal pulses.   Neurological: She is alert and oriented to person, place, and time.  Skin: Skin is warm.  Nursing note and vitals reviewed.  neurovascular status was found to be intact with muscle strength adequate range of motion within normal limits. Patient has mild edema around the first MPJ right but has no crepitus upon movement of the big toe and reasonable dorsi and plantarflexion without pain. The toe has shortened secondary to the trauma she experienced during the initial postoperative period when she did not wear any form of immobilization and the osteotomy did crack secondary to pressure. The second toe has vague complaints of not feeling right and it is elongated in comparison to the hallux and she has quite a bit of irritation of her right third digit distal interphalangeal joint and admits that she's had a lot of problems with her hands and has nodules on her hands and feels like she has arthritis in that toe     Assessment:    Unfortunate trauma to the first metatarsal secondary to injury sustained during the postop period that has not respond to immobilization as far as healing goes and while functions well clinically continues to give her moderate swelling and a feeling that it is not right. Second digit difficult to make complete determination but it may be the length of the toe and the  length of the metatarsal in comparison to the first causing which she is experiencing and severe arthritis of the distal interphalangeal joint digit 3 right        Plan:    H&P and x-rays reviewed with patient. Unfortunately there is not been healing of the dorsal wing of the first metatarsal which I have been hopeful would happen and we have been unable to operate on this earlier due to the fact she had to have shoulder replacement surgery. The second metatarsal is long in conjunction to the first as is the second digit and there is quite a bit of changes at the distal interphalangeal joint digit 3 right foot. I reviewed all findings with patient and I do think that given the symptoms she still experience she is motivated to do something and I have recommended trying to reposition the dorsal arm of the first metatarsal that had cracked during the initial period and also probable shortening osteotomy second metatarsal right with possible digital fusion of the digit to bring it into alignment and distal arthroplasty digit 3 right. I am going to send her to Dr. Jacqualyn Posey from a second opinion and also I will need to speak to her orthopedic surgeon for when it will be appropriate for her to be nonweightbearing on her foot in conjunction with the shoulder surgery she had in March. All of this education was given to patient and she is comfortable with this  and I also explained long-term it's possible she is going to need fusion of the first metatarsal phalangeal joint with bone graft but I am hopeful that that can be avoided. Reappoint for consult with Dr. Jacqualyn Posey and we will develop a surgical plan for this patient. Also have recommended bone stimulator to use to try to encourage healing and especially will hopefully improve the healing prospects post revisional surgery   X-ray taken today indicated there is still continued healing that needs to occur of the first metatarsal dorsal wing with no further movement  of the bone from previous but still in malalignment with probable joint cartilage that is not aligned. Elongated second metatarsal in conjunction to first elongated second digit and multiple signs of arthritis of the middle phalanx of the distal interphalangeal joint digit 3 right

## 2017-04-05 ENCOUNTER — Encounter (HOSPITAL_COMMUNITY): Payer: Self-pay

## 2017-04-05 ENCOUNTER — Ambulatory Visit (HOSPITAL_COMMUNITY): Payer: BLUE CROSS/BLUE SHIELD

## 2017-04-05 DIAGNOSIS — M25512 Pain in left shoulder: Secondary | ICD-10-CM

## 2017-04-05 DIAGNOSIS — R29898 Other symptoms and signs involving the musculoskeletal system: Secondary | ICD-10-CM

## 2017-04-05 DIAGNOSIS — M25612 Stiffness of left shoulder, not elsewhere classified: Secondary | ICD-10-CM

## 2017-04-05 NOTE — Therapy (Signed)
[place], [place], [address] Phone: [phone]   Fax:  [phone]  Occupational Therapy Treatment  Patient Details  Name: [name] MRN: [id number] Date of Birth: 1961-10-30 Referring Provider: Dr. Tania Ade  Encounter Date: 04/05/2017      OT End of Session - 04/05/17 1232    Visit Number 6   Number of Visits 16   Date for OT Re-Evaluation 05/07/17  mini reassessment 04/07/17   Authorization Type 1) Medicare part A  2) BCBS   Authorization Time Period Before 10th visit; no visit limit, no copay   Authorization - Visit Number 6   Authorization - Number of Visits 10   OT Start Time [phone]  pt arrived late   OT Stop Time 1030   OT Time Calculation (min) 35 min   Activity Tolerance Patient tolerated treatment well   Behavior During Therapy [facility] for tasks assessed/performed      Past Medical History:  Diagnosis Date  . Anxiety   . Arthritis   . Breast cancer ([place])    rt breast  and left breast breast removed  . Breast reconstruction deformity   . Complication of anesthesia    shaking-after mastectomies-resp and hr dropped  . Depression, major    suicidal thoughts -implants, mental health, 05/11  . Fibromyalgia   . Headache(784.0)   . Hypersomnia    sleep attacks  . Hypothyroidism   . Migraine headache   . Narcolepsy   . Neoplasm    malignant breast, history of status post bilateral  . Post-operative nausea and vomiting   . Sleep apnea    CPAP  . Sleep apnea with use of continuous positive airway pressure (CPAP)    AHI 20 in HST, titration to 6 cm water - residual AHi 6 . remaining hypersomnic.     Past Surgical History:  Procedure Laterality Date  . Breast reconstructioin  7/09   flaps-bilat mastectomies  . BTL    . CARPAL TUNNEL RELEASE  2011   both hands  . CARPOMETACARPEL SUSPENSION PLASTY Right 08/29/2014   Procedure: REMOVAL DERMASPAN SUSPENSIONPLASTY RIGHT THUMB ABDUCTOR  POLLIS TRANSFER;  Surgeon: Daryll Brod, MD;  Location: [place];  Service: Orthopedics;  Laterality: Right;  . COLONOSCOPY W/ POLYPECTOMY  4/03   tubulovillous adenoma  . FINGER ARTHROSCOPY WITH CARPOMETACARPEL [facility]) ARTHROPLASTY Right 06/20/2014   Procedure: ARTHROSCOPY RIGHT THUMB CARPOMETACARPEL GRAFT JACKET INTERPOSITION PARTIAL TRAPEZIECTOMY POSSIBLE OPEN;  Surgeon: Daryll Brod, MD;  Location: [place];  Service: Orthopedics;  Laterality: Right;  . GANGLION CYST EXCISION     both [phone]  . hysterestomy  2005  . MASTECTOMY Bilateral 2008  . post- a -cath  2008  . TOTAL SHOULDER ARTHROPLASTY Left 01/13/2017   Procedure: LEFT TOTAL SHOULDER ARTHROPLASTY;  Surgeon: Tania Ade, MD;  Location: [place];  Service: Orthopedics;  Laterality: Left;  LEFT TOTAL SHOULDER ARTHROPLASTY  . TRIGGER FINGER RELEASE Right 08/29/2014   Procedure: RELEASE TRIGGER FINGER/A-1 PULLEY;  Surgeon: Daryll Brod, MD;  Location: [place];  Service: Orthopedics;  Laterality: Right;  . wisdom teeth out      There were no vitals filed for this visit.      Subjective Assessment - 04/05/17 1019    Subjective  S: It's not feeling too bad today.   Currently in Pain? Yes   Pain Score 4    Pain Location Shoulder   Pain Orientation Left  Pain Descriptors / Indicators Constant;Aching   Pain Type Acute pain   Pain Radiating Towards towards neck   Pain Onset More than a month ago   Pain Frequency Constant   Aggravating Factors  movement, use   Pain Relieving Factors rest, pain medication   Effect of Pain on Daily Activities severe effect   Multiple Pain Sites No            [facility] OT Assessment - 04/05/17 1021      Assessment   Diagnosis s/p left total shoulder arthroplasty     Precautions   Precautions Shoulder   Type of Shoulder Precautions Weeks 6-10 (4/20-5/18): Phase II ROM-full pain free P/ROM, begin AA/ROM and progress to A/ROM, scapular  strengthening; [id number] (5/21-6/29): Phase III-strengthening                  OT Treatments/Exercises (OP) - 04/05/17 1021      Exercises   Exercises Shoulder     Shoulder Exercises: Supine   Protraction PROM;5 reps   Horizontal ABduction PROM;5 reps   External Rotation PROM;5 reps   Internal Rotation PROM;5 reps   Flexion PROM;5 reps   ABduction PROM;5 reps     Shoulder Exercises: Seated   Protraction AAROM;10 reps   Horizontal ABduction AAROM;10 reps   External Rotation AAROM;10 reps   Internal Rotation AAROM;10 reps   Flexion AAROM;10 reps   Abduction AAROM;10 reps     Shoulder Exercises: Therapy Ball   Right/Left Other (comment)   Right/Left Limitations completed 4 circles to the right and 2 circles to the left     Manual Therapy   Manual Therapy Myofascial release   Manual therapy comments Completed separately from therapeutic exercises   Myofascial Release Myofascial release to left upper arm, trapezius, and scapularis regions to decrease pain and fascial restrictions and increase joint range of motion.                   OT Short Term Goals - 03/15/17 0843      OT SHORT TERM GOAL #1   Title Pt will be educated on and independent in HEP to improve LUE functional use during daily tasks.    Time 4   Period Weeks   Status On-going     OT SHORT TERM GOAL #2   Title Pt will decrease pain in LUE to 4/10 to improve ability to use LUE as assist during daily tasks.    Time 4   Period Weeks   Status On-going     OT SHORT TERM GOAL #3   Title Pt will decrease fascial restrictions in LUE from moderate to minimal amounts or less to increase LUE mobility.    Time 4   Period Weeks   Status On-going     OT SHORT TERM GOAL #4   Title Pt will improve LUE P/ROM to [facility] to improve ability to use LUE as assist during dressing tasks.    Time 4   Period Weeks   Status On-going     OT SHORT TERM GOAL #5   Title Pt will improve LUE strength to 3+/5 to  improve ability to perform grooming tasks.    Time 4   Period Weeks   Status On-going           OT Long Term Goals - 03/15/17 0843      OT LONG TERM GOAL #1   Title Pt will return to prior level of functioning and independence with  daily and leisure task completion.    Time 8   Period Weeks   Status On-going     OT LONG TERM GOAL #2   Title Pt will decrease pain in LUE to 2/10 or less to improve ability to perform functional task completion.    Time 8   Period Weeks   Status On-going     OT LONG TERM GOAL #3   Title Pt will decrease LUE fascial restrictions from min to trace amounts to improve mobility required for overhead reaching.    Time 8   Period Weeks   Status On-going     OT LONG TERM GOAL #4   Title Pt will increase LUE A/ROM to [facility] to improve ability to perform functional reaching tasks in all planes.    Time 8   Period Weeks   Status On-going     OT LONG TERM GOAL #5   Title Pt will improve strength in LUE to 4/5 to improve ability to perform gardening tasks.    Time 8   Period Weeks   Status On-going               Plan - 04/05/17 1233    Clinical Impression Statement A: Added therapy ball stretches although patient was unable to complete full amount due to time constraint. Increased pain self during seated AA/ROM exercises although patient was able to tolerate and complete with increased time and rest breaks.    Occupational performance deficits (Please refer to evaluation for details): ADL's;IADL's;Leisure   Plan P: Continue with therapy ball circles.  Attempt scapular strengthening with red band. Complete mini reassessment.      Patient will benefit from skilled therapeutic intervention in order to improve the following deficits and impairments:  Decreased activity tolerance, Decreased strength, Impaired flexibility, Decreased range of motion, Pain, Impaired UE functional use, Increased fascial restricitons  Visit Diagnosis: Acute pain of  left shoulder  Other symptoms and signs involving the musculoskeletal system  Stiffness of left shoulder, not elsewhere classified    Problem List Patient Active Problem List   Diagnosis Date Noted  . Osteoarthritis of left shoulder 01/13/2017  . S/P shoulder replacement, left 01/13/2017  . OSA on CPAP 04/01/2015  . Subacute confusional state 04/01/2015  . Hallucinations, visual 04/01/2015  . Hypnapompic hallucinations 04/01/2015  . Sleep apnea with use of continuous positive airway pressure (CPAP)   . Hypersomnia, persistent 03/30/2013  . Obstructive sleep apnea (adult) (pediatric) 03/30/2013  . CONSTIPATION 11/18/2008  . RECTAL BLEEDING 11/18/2008  . HEMORRHOIDS, INTERNAL 11/15/2008  . DIVERTICULOSIS, COLON 11/15/2008  . CELLULITIS/ABSCESS, TRUNK 02/22/2008  . LOW BACK PAIN 02/22/2008  . DIZZINESS 02/22/2008  . FATIGUE 02/22/2008  . PALPITATIONS 02/22/2008  . MURMUR, CARDIAC, UNDIAGNOSED 02/22/2008  . BREAST CANCER, HX OF 02/22/2008  . COUGH 01/05/2008  . DEPRESSION 07/05/2007  . UTERINE POLYP 07/05/2007  . FIBROMYALGIA 07/05/2007  . COLONIC POLYPS, HX OF 07/05/2007  . HX, PERSONAL, CERVICAL DYSPLASIA 07/05/2007   Ailene Ravel, OTR/L,CBIS  [phone]  04/05/2017, 12:37 PM  [facility] [address] [place], [place], [address] Phone: [phone]   Fax:  [phone]  Name: [name] MRN: [id number] Date of Birth: 1961-10-27

## 2017-04-07 ENCOUNTER — Encounter (HOSPITAL_COMMUNITY): Payer: Medicare Other

## 2017-04-08 ENCOUNTER — Encounter: Payer: Self-pay | Admitting: Internal Medicine

## 2017-04-08 ENCOUNTER — Ambulatory Visit (HOSPITAL_COMMUNITY): Payer: BLUE CROSS/BLUE SHIELD

## 2017-04-08 ENCOUNTER — Ambulatory Visit (INDEPENDENT_AMBULATORY_CARE_PROVIDER_SITE_OTHER): Payer: BLUE CROSS/BLUE SHIELD | Admitting: Internal Medicine

## 2017-04-08 ENCOUNTER — Telehealth (HOSPITAL_COMMUNITY): Payer: Self-pay | Admitting: Internal Medicine

## 2017-04-08 VITALS — BP 104/72 | HR 64 | Ht 64.0 in | Wt 175.0 lb

## 2017-04-08 DIAGNOSIS — Z86010 Personal history of colonic polyps: Secondary | ICD-10-CM | POA: Diagnosis not present

## 2017-04-08 DIAGNOSIS — K5901 Slow transit constipation: Secondary | ICD-10-CM | POA: Diagnosis not present

## 2017-04-08 MED ORDER — LINACLOTIDE 145 MCG PO CAPS: 145.0000 ug | ORAL_CAPSULE | Freq: Every day | ORAL | 6 refills | Status: DC

## 2017-04-08 MED ORDER — NA SULFATE-K SULFATE-MG SULF 17.5-3.13-1.6 GM/177ML PO SOLN: 1.0000 | Freq: Once | ORAL | 0 refills | Status: AC

## 2017-04-08 NOTE — Patient Instructions (Signed)
We have sent the following medications to your pharmacy for you to pick up at your convenience:   Linzess  You have been scheduled for a colonoscopy. Please follow written instructions given to you at your visit today.  Please pick up your prep supplies at the pharmacy within the next 1-3 days. If you use inhalers (even only as needed), please bring them with you on the day of your procedure. Your physician has requested that you go to www.[email or web address].com and enter the access code given to you at your visit today. This web site gives a general overview about your procedure. However, you should still follow specific instructions given to you by our office regarding your preparation for the procedure.  

## 2017-04-08 NOTE — Progress Notes (Signed)
HISTORY OF PRESENT ILLNESS:  [name] is a 56 y.o. female with a history of stage IIB breast cancer diagnosed August 2008 (status post bilateral mastectomy, chemotherapy, radiation therapy, and reconstructive surgery), fibromyalgia, chronic narcotic therapy for chronic pain syndrome, and anxiety/depression. I last saw the patient in the office January 2010 regarding rectal bleeding. She underwent colonoscopy that same month and was found to have moderate diverticulosis and a diminutive hyperplastic polyp. Follow-up in 5 years was recommended because her index colonoscopy in 2003 revealed a tubulovillous adenoma. She acknowledges being overdue for follow-up. Her chief complaint today is concerns overlying on her left shoulder for colonoscopy, having colonoscopy before her upcoming foot surgery, and chronic constipation. Her chronic constipation is related to narcotics. She was prescribed Movantik which works. However, she tells me that her pain clinic physician did not think this was the best option given her chronic narcotic therapy (though this is a treatment for narcotic induced constipation). She tells me that she has taken MiraLAX in the remote past. She tells me she wants some prescription alternative to try for her constipation. GI review of systems is negative except for constipation.  REVIEW OF SYSTEMS:  All non-GI ROS negative unless otherwise stated in history of present illness except for arthritis, anxiety, chronic pain syndrome, headaches  Past Medical History:  Diagnosis Date  . Anxiety   . Arthritis   . Breast cancer ([place])    rt breast  and left breast breast removed  . Breast reconstruction deformity   . Complication of anesthesia    shaking-after mastectomies-resp and hr dropped  . Depression, major    suicidal thoughts -implants, mental health, 05/11  . Fibromyalgia   . Headache(784.0)   . Hypersomnia    sleep attacks  . Hypothyroidism   . Migraine headache   .  Narcolepsy   . Neoplasm    malignant breast, history of status post bilateral  . Post-operative nausea and vomiting   . Sleep apnea    CPAP  . Sleep apnea with use of continuous positive airway pressure (CPAP)    AHI 20 in HST, titration to 6 cm water - residual AHi 6 . remaining hypersomnic.     Past Surgical History:  Procedure Laterality Date  . Breast reconstructioin  7/09   flaps-bilat mastectomies  . BTL    . CARPAL TUNNEL RELEASE  2011   both hands  . CARPOMETACARPEL SUSPENSION PLASTY Right 08/29/2014   Procedure: REMOVAL DERMASPAN SUSPENSIONPLASTY RIGHT THUMB ABDUCTOR POLLIS TRANSFER;  Surgeon: Daryll Brod, MD;  Location: [facility];  Service: Orthopedics;  Laterality: Right;  . COLONOSCOPY W/ POLYPECTOMY  4/03   tubulovillous adenoma  . FINGER ARTHROSCOPY WITH CARPOMETACARPEL [facility]) ARTHROPLASTY Right 06/20/2014   Procedure: ARTHROSCOPY RIGHT THUMB CARPOMETACARPEL GRAFT JACKET INTERPOSITION PARTIAL TRAPEZIECTOMY POSSIBLE OPEN;  Surgeon: Daryll Brod, MD;  Location: [facility];  Service: Orthopedics;  Laterality: Right;  . GANGLION CYST EXCISION     both [phone]  . hysterestomy  2005  . MASTECTOMY Bilateral 2008  . post- a -cath  2008  . TOTAL SHOULDER ARTHROPLASTY Left 01/13/2017   Procedure: LEFT TOTAL SHOULDER ARTHROPLASTY;  Surgeon: Tania Ade, MD;  Location: [place];  Service: Orthopedics;  Laterality: Left;  LEFT TOTAL SHOULDER ARTHROPLASTY  . TRIGGER FINGER RELEASE Right 08/29/2014   Procedure: RELEASE TRIGGER FINGER/A-1 PULLEY;  Surgeon: Daryll Brod, MD;  Location: [employer] [facility];  Service: Orthopedics;  Laterality: Right;  . wisdom teeth out  Social History [name]  reports that she has been smoking Cigarettes.  She has been smoking about 0.25 packs per day. She has never used smokeless tobacco. She reports that she drinks alcohol. She reports that she does not use drugs.  family history includes Breast cancer in  her maternal aunt, mother, and sister; Colon cancer in her cousin and maternal grandmother.  Allergies  Allergen Reactions  . Sulfonamide Derivatives Hives and Other (See Comments)    FEVER       PHYSICAL EXAMINATION: Vital signs: BP 104/72   Pulse 64   Ht 5\' 4"  (1.626 m)   Wt 175 lb (79.4 kg)   BMI 30.04 kg/m   Constitutional: generally well-appearing, no acute distress Psychiatric: alert and oriented x3, cooperative  Eyes: extraocular movements intact, anicteric, conjunctiva pink Mouth: oral pharynx moist, no lesions Neck: supple no lymphadenopathy Cardiovascular: heart regular rate and rhythm, no murmur Lungs: clear to auscultation bilaterally Abdomen: soft, obese, nontender, nondistended, no obvious ascites, no peritoneal signs, normal bowel sounds, no organomegaly. Prior surgical incisions well-healed Rectal: Deferred until colonoscopy Extremities: no clubbing cyanosis or lower extremity edema bilaterally Skin: no lesions on visible extremities Neuro: No focal deficits. Cranial nerves intact  ASSESSMENT:  #1. Constipation secondary to chronic narcotic use #2. History of tubular villous adenoma 2003. Negative for neoplasia 2010. Overdue for follow-up #3. Multiple medical problems   PLAN:  #1. Recommended daily MiraLAX. Adjust doses as needed #2. Trial of Linzess 145 g daily. Samples and a prescription provided #3. Schedule colonoscopy. Magnesium citrate prior to standard bowel prep to facilitate with cleansing.The nature of the procedure, as well as the risks, benefits, and alternatives were carefully and thoroughly reviewed with the patient. Ample time for discussion and questions allowed. The patient understood, was satisfied, and agreed to proceed.

## 2017-04-08 NOTE — Telephone Encounter (Signed)
04/08/17 pt cx because she said she wasn't feeling well this morning

## 2017-04-11 ENCOUNTER — Ambulatory Visit (HOSPITAL_COMMUNITY): Payer: BLUE CROSS/BLUE SHIELD | Attending: Orthopedic Surgery

## 2017-04-11 ENCOUNTER — Ambulatory Visit: Payer: BLUE CROSS/BLUE SHIELD | Admitting: Podiatry

## 2017-04-11 ENCOUNTER — Encounter (HOSPITAL_COMMUNITY): Payer: Self-pay

## 2017-04-11 DIAGNOSIS — M25512 Pain in left shoulder: Secondary | ICD-10-CM | POA: Diagnosis present

## 2017-04-11 DIAGNOSIS — R29898 Other symptoms and signs involving the musculoskeletal system: Secondary | ICD-10-CM | POA: Diagnosis present

## 2017-04-11 DIAGNOSIS — M25612 Stiffness of left shoulder, not elsewhere classified: Secondary | ICD-10-CM

## 2017-04-11 NOTE — Therapy (Signed)
[facility] [place], [place], [address] Phone: [phone]   Fax:  [phone]  Occupational Therapy Treatment And mini reassess Patient Details  Name: [name] MRN: [id number] Date of Birth: 01-09-1961 Referring Provider: Dr. Tania Ade  Encounter Date: 04/11/2017      OT End of Session - 04/11/17 1042    Visit Number 7   Number of Visits 16   Date for OT Re-Evaluation 05/07/17   Authorization Type 1) Medicare part A  2) BCBS   Authorization Time Period Before 10th visit; no visit limit, no copay   Authorization - Visit Number 7   Authorization - Number of Visits 10   OT Start Time [phone]   OT Stop Time 1040   OT Time Calculation (min) 44 min   Activity Tolerance Patient limited by pain   Behavior During Therapy [facility] for tasks assessed/performed      Past Medical History:  Diagnosis Date  . Anxiety   . Arthritis   . Breast cancer ([place])    rt breast  and left breast breast removed  . Breast reconstruction deformity   . Complication of anesthesia    shaking-after mastectomies-resp and hr dropped  . Depression, major    suicidal thoughts -implants, mental health, 05/11  . Fibromyalgia   . Headache(784.0)   . Hypersomnia    sleep attacks  . Hypothyroidism   . Migraine headache   . Narcolepsy   . Neoplasm    malignant breast, history of status post bilateral  . Post-operative nausea and vomiting   . Sleep apnea    CPAP  . Sleep apnea with use of continuous positive airway pressure (CPAP)    AHI 20 in HST, titration to 6 cm water - residual AHi 6 . remaining hypersomnic.     Past Surgical History:  Procedure Laterality Date  . Breast reconstructioin  7/09   flaps-bilat mastectomies  . BTL    . CARPAL TUNNEL RELEASE  2011   both hands  . CARPOMETACARPEL SUSPENSION PLASTY Right 08/29/2014   Procedure: REMOVAL DERMASPAN SUSPENSIONPLASTY RIGHT THUMB ABDUCTOR POLLIS TRANSFER;  Surgeon: Daryll Brod,  MD;  Location: [place];  Service: Orthopedics;  Laterality: Right;  . COLONOSCOPY W/ POLYPECTOMY  4/03   tubulovillous adenoma  . FINGER ARTHROSCOPY WITH CARPOMETACARPEL [facility]) ARTHROPLASTY Right 06/20/2014   Procedure: ARTHROSCOPY RIGHT THUMB CARPOMETACARPEL GRAFT JACKET INTERPOSITION PARTIAL TRAPEZIECTOMY POSSIBLE OPEN;  Surgeon: Daryll Brod, MD;  Location: [place];  Service: Orthopedics;  Laterality: Right;  . GANGLION CYST EXCISION     both [phone]  . hysterestomy  2005  . MASTECTOMY Bilateral 2008  . post- a -cath  2008  . TOTAL SHOULDER ARTHROPLASTY Left 01/13/2017   Procedure: LEFT TOTAL SHOULDER ARTHROPLASTY;  Surgeon: Tania Ade, MD;  Location: [place];  Service: Orthopedics;  Laterality: Left;  LEFT TOTAL SHOULDER ARTHROPLASTY  . TRIGGER FINGER RELEASE Right 08/29/2014   Procedure: RELEASE TRIGGER FINGER/A-1 PULLEY;  Surgeon: Daryll Brod, MD;  Location: [place];  Service: Orthopedics;  Laterality: Right;  . wisdom teeth out      There were no vitals filed for this visit.      Subjective Assessment - 04/11/17 0958    Subjective  S: I can barely lift it today.   Currently in Pain? Yes   Pain Score 7    Pain Location Shoulder   Pain Orientation Left   Pain Descriptors / Indicators Aching;Constant;Throbbing  Pain Type Acute pain            [facility] OT Assessment - 04/11/17 0959      Assessment   Diagnosis s/p left total shoulder arthroplasty     Precautions   Precautions Shoulder   Type of Shoulder Precautions Weeks 6-10 (4/20-5/18): Phase II ROM-full pain free P/ROM, begin AA/ROM and progress to A/ROM, scapular strengthening; [id number] (5/21-6/29): Phase III-strengthening     AROM   Overall AROM Comments Assessed seated, er/IR adducted   AROM Assessment Site Shoulder   Right/Left Shoulder Left   Left Shoulder Flexion 130 Degrees  previous: 118   Left Shoulder ABduction 122 Degrees  previous: 71   Left  Shoulder Internal Rotation 90 Degrees  previous: same   Left Shoulder External Rotation 66 Degrees  previous: 41     PROM   Overall PROM Comments Assessed supine, er/IR adducted   PROM Assessment Site Shoulder   Right/Left Shoulder Left   Left Shoulder Flexion 144 Degrees  previous: 126 degrees   Left Shoulder ABduction 122 Degrees  previous: 72 degrees   Left Shoulder Internal Rotation 90 Degrees  previous: 90 degrees   Left Shoulder External Rotation 82 Degrees  previous 45 degrees     Strength   Overall Strength Comments Assessed seated. IR/er adducted. Strength not assessed prior to this date.   Strength Assessment Site Shoulder   Right/Left Shoulder Left   Left Shoulder Flexion 3-/5   Left Shoulder Extension 3-/5   Left Shoulder Internal Rotation 5/5   Left Shoulder External Rotation 3+/5                  OT Treatments/Exercises (OP) - 04/11/17 1023      Exercises   Exercises Shoulder     Shoulder Exercises: Supine   Protraction PROM;5 reps   Horizontal ABduction PROM;5 reps   External Rotation PROM;5 reps   Internal Rotation PROM;5 reps   Flexion PROM;5 reps   ABduction PROM;5 reps     Shoulder Exercises: Seated   Protraction AROM;10 reps   Horizontal ABduction AROM;10 reps   External Rotation AROM;10 reps   Internal Rotation AROM;10 reps   Flexion AROM;10 reps   Abduction AROM;10 reps     Manual Therapy   Manual Therapy Myofascial release   Manual therapy comments Completed separately from therapeutic exercises   Myofascial Release Myofascial release to left upper arm, trapezius, and scapularis regions to decrease pain and fascial restrictions and increase joint range of motion.                 OT Education - 04/11/17 1041    Education provided Yes   Education Details A/ROM shoulder exercises    Person(s) Educated Patient   Methods Explanation;Demonstration;Handout;Verbal cues   Comprehension Verbalized understanding;Returned  demonstration          OT Short Term Goals - 04/11/17 1032      OT SHORT TERM GOAL #1   Title Pt will be educated on and independent in HEP to improve LUE functional use during daily tasks.    Time 4   Period Weeks   Status Achieved     OT SHORT TERM GOAL #2   Title Pt will decrease pain in LUE to 4/10 to improve ability to use LUE as assist during daily tasks.    Time 4   Period Weeks   Status On-going     OT SHORT TERM GOAL #3   Title Pt will decrease fascial  restrictions in LUE from moderate to minimal amounts or less to increase LUE mobility.    Time 4   Period Weeks   Status Achieved     OT SHORT TERM GOAL #4   Title Pt will improve LUE P/ROM to [facility] to improve ability to use LUE as assist during dressing tasks.    Time 4   Period Weeks   Status Achieved     OT SHORT TERM GOAL #5   Title Pt will improve LUE strength to 3+/5 to improve ability to perform grooming tasks.    Time 4   Period Weeks   Status Partially Met           OT Long Term Goals - 03/15/17 0843      OT LONG TERM GOAL #1   Title Pt will return to prior level of functioning and independence with daily and leisure task completion.    Time 8   Period Weeks   Status On-going     OT LONG TERM GOAL #2   Title Pt will decrease pain in LUE to 2/10 or less to improve ability to perform functional task completion.    Time 8   Period Weeks   Status On-going     OT LONG TERM GOAL #3   Title Pt will decrease LUE fascial restrictions from min to trace amounts to improve mobility required for overhead reaching.    Time 8   Period Weeks   Status On-going     OT LONG TERM GOAL #4   Title Pt will increase LUE A/ROM to [facility] to improve ability to perform functional reaching tasks in all planes.    Time 8   Period Weeks   Status On-going     OT LONG TERM GOAL #5   Title Pt will improve strength in LUE to 4/5 to improve ability to perform gardening tasks.    Time 8   Period Weeks   Status On-going                Plan - 04/11/17 1043    Clinical Impression Statement A: Mini reassessment completed today. Patient met 3/5 short term goals with strengthening goal partially met. Pt attempting to use left arm more when pain allows. Pt feels overall left arm ROM is improved. Pain continues to be a limiting factor with all daily activities. Pt progressed to A/ROM seated. HEP was updated. Verbal cues proved for form and technique. Education was provided for better arm positioning during upcoming colonoscopy.    Plan P: Continue with seated A/ROM exercises. Attempt scapular strengthening with red band. Update G-code.   OT Home Exercise Plan 5/1: table slides; 5/8: scapular A/ROM; 6/4: Pt progressed to A/ROM shoulder exercises      Patient will benefit from skilled therapeutic intervention in order to improve the following deficits and impairments:  Decreased activity tolerance, Decreased strength, Impaired flexibility, Decreased range of motion, Pain, Impaired UE functional use, Increased fascial restricitons  Visit Diagnosis: Acute pain of left shoulder  Other symptoms and signs involving the musculoskeletal system  Stiffness of left shoulder, not elsewhere classified    Problem List Patient Active Problem List   Diagnosis Date Noted  . Osteoarthritis of left shoulder 01/13/2017  . S/P shoulder replacement, left 01/13/2017  . OSA on CPAP 04/01/2015  . Subacute confusional state 04/01/2015  . Hallucinations, visual 04/01/2015  . Hypnapompic hallucinations 04/01/2015  . Sleep apnea with use of continuous positive airway pressure (CPAP)   .  Hypersomnia, persistent 03/30/2013  . Obstructive sleep apnea (adult) (pediatric) 03/30/2013  . CONSTIPATION 11/18/2008  . RECTAL BLEEDING 11/18/2008  . HEMORRHOIDS, INTERNAL 11/15/2008  . DIVERTICULOSIS, COLON 11/15/2008  . CELLULITIS/ABSCESS, TRUNK 02/22/2008  . LOW BACK PAIN 02/22/2008  . DIZZINESS 02/22/2008  . FATIGUE 02/22/2008  .  PALPITATIONS 02/22/2008  . MURMUR, CARDIAC, UNDIAGNOSED 02/22/2008  . BREAST CANCER, HX OF 02/22/2008  . COUGH 01/05/2008  . DEPRESSION 07/05/2007  . UTERINE POLYP 07/05/2007  . FIBROMYALGIA 07/05/2007  . COLONIC POLYPS, HX OF 07/05/2007  . HX, PERSONAL, CERVICAL DYSPLASIA 07/05/2007   Ailene Ravel, OTR/L,CBIS  [phone]  04/11/2017, 10:52 AM  [facility] [address] [place], [place], [address] Phone: [phone]   Fax:  [phone]  Name: JAASIA VIGLIONE MRN: [id number] Date of Birth: 28-Mar-1961

## 2017-04-11 NOTE — Patient Instructions (Signed)
1) Shoulder Protraction    Begin with elbows by your side, slowly "punch" straight out in front of you.      2) Shoulder Flexion  Standing:         Begin with arms at your side with thumbs pointed up, slowly raise both arms up and forward towards overhead.          3) Horizontal abduction/adduction   Standing:           Begin with arms straight out in front of you, bring out to the side in at "T" shape. Keep arms straight entire time.       4) Internal & External Rotation    *No band* -Stand with elbows at the side and elbows bent 90 degrees. Move your forearms away from your body, then bring back inward toward the body.     5) Shoulder Abduction  Standing:       Lying on your back begin with your arms flat on the table next to your side. Slowly move your arms out to the side so that they go overhead, in a jumping jack or snow angel movement.       Repeat all exercises 10-15 times, 1-2 times per day.  

## 2017-04-13 ENCOUNTER — Ambulatory Visit (HOSPITAL_COMMUNITY): Payer: BLUE CROSS/BLUE SHIELD

## 2017-04-13 ENCOUNTER — Encounter (HOSPITAL_COMMUNITY): Payer: Self-pay

## 2017-04-13 DIAGNOSIS — M25512 Pain in left shoulder: Secondary | ICD-10-CM | POA: Diagnosis not present

## 2017-04-13 DIAGNOSIS — R29898 Other symptoms and signs involving the musculoskeletal system: Secondary | ICD-10-CM

## 2017-04-13 DIAGNOSIS — M25612 Stiffness of left shoulder, not elsewhere classified: Secondary | ICD-10-CM

## 2017-04-13 NOTE — Therapy (Signed)
[place], [place], [address] Phone: [phone]   Fax:  [phone]  Occupational Therapy Treatment  Patient Details  Name: [name] MRN: [id number] Date of Birth: January 24, 1961 Referring Provider: Dr. Tania Ade  Encounter Date: 04/13/2017      OT End of Session - 04/13/17 1036    Visit Number 8   Number of Visits 16   Date for OT Re-Evaluation 05/07/17   Authorization Type 1) Medicare part A  2) BCBS   Authorization Time Period Before 18th visit; no visit limit, no copay   Authorization - Visit Number 8   Authorization - Number of Visits 18   OT Start Time 0955   OT Stop Time 1033   OT Time Calculation (min) 38 min   Activity Tolerance Patient tolerated treatment well;Patient limited by pain   Behavior During Therapy [facility] for tasks assessed/performed      Past Medical History:  Diagnosis Date  . Anxiety   . Arthritis   . Breast cancer ([place])    rt breast  and left breast breast removed  . Breast reconstruction deformity   . Complication of anesthesia    shaking-after mastectomies-resp and hr dropped  . Depression, major    suicidal thoughts -implants, mental health, 05/11  . Fibromyalgia   . Headache(784.0)   . Hypersomnia    sleep attacks  . Hypothyroidism   . Migraine headache   . Narcolepsy   . Neoplasm    malignant breast, history of status post bilateral  . Post-operative nausea and vomiting   . Sleep apnea    CPAP  . Sleep apnea with use of continuous positive airway pressure (CPAP)    AHI 20 in HST, titration to 6 cm water - residual AHi 6 . remaining hypersomnic.     Past Surgical History:  Procedure Laterality Date  . Breast reconstructioin  7/09   flaps-bilat mastectomies  . BTL    . CARPAL TUNNEL RELEASE  2011   both hands  . CARPOMETACARPEL SUSPENSION PLASTY Right 08/29/2014   Procedure: REMOVAL DERMASPAN SUSPENSIONPLASTY RIGHT THUMB ABDUCTOR POLLIS TRANSFER;   Surgeon: Daryll Brod, MD;  Location: [place];  Service: Orthopedics;  Laterality: Right;  . COLONOSCOPY W/ POLYPECTOMY  4/03   tubulovillous adenoma  . FINGER ARTHROSCOPY WITH CARPOMETACARPEL [facility]) ARTHROPLASTY Right 06/20/2014   Procedure: ARTHROSCOPY RIGHT THUMB CARPOMETACARPEL GRAFT JACKET INTERPOSITION PARTIAL TRAPEZIECTOMY POSSIBLE OPEN;  Surgeon: Daryll Brod, MD;  Location: [place];  Service: Orthopedics;  Laterality: Right;  . GANGLION CYST EXCISION     both [phone]  . hysterestomy  2005  . MASTECTOMY Bilateral 2008  . post- a -cath  2008  . TOTAL SHOULDER ARTHROPLASTY Left 01/13/2017   Procedure: LEFT TOTAL SHOULDER ARTHROPLASTY;  Surgeon: Tania Ade, MD;  Location: [place];  Service: Orthopedics;  Laterality: Left;  LEFT TOTAL SHOULDER ARTHROPLASTY  . TRIGGER FINGER RELEASE Right 08/29/2014   Procedure: RELEASE TRIGGER FINGER/A-1 PULLEY;  Surgeon: Daryll Brod, MD;  Location: [place];  Service: Orthopedics;  Laterality: Right;  . wisdom teeth out      There were no vitals filed for this visit.      Subjective Assessment - 04/13/17 1030    Subjective  S: Do you have any biofreeze? It really helps.   Currently in Pain? Yes   Pain Score 7    Pain Location Shoulder   Pain Orientation Left   Pain Descriptors /  Indicators Aching;Constant   Pain Type Acute pain   Pain Radiating Towards towards neck   Pain Onset More than a month ago   Pain Frequency Constant   Aggravating Factors  use   Pain Relieving Factors rest, pain medication   Effect of Pain on Daily Activities severe            [facility] OT Assessment - 04/13/17 1042      Assessment   Diagnosis s/p left total shoulder arthroplasty     Precautions   Precautions Shoulder   Type of Shoulder Precautions Weeks 6-10 (4/20-5/18): Phase II ROM-full pain free P/ROM, begin AA/ROM and progress to A/ROM, scapular strengthening; [id number] (5/21-6/29): Phase III-strengthening                   OT Treatments/Exercises (OP) - 04/13/17 1044      Exercises   Exercises Shoulder     Shoulder Exercises: Supine   Protraction PROM;5 reps   Horizontal ABduction PROM;5 reps   External Rotation PROM;5 reps   Internal Rotation PROM;5 reps   Flexion PROM;5 reps   ABduction PROM;5 reps     Shoulder Exercises: Standing   Protraction AROM;10 reps   Horizontal ABduction AROM;10 reps   External Rotation AROM;10 reps   Internal Rotation AROM;10 reps   Flexion AROM;10 reps   ABduction AROM;10 reps   Extension Theraband;12 reps   Theraband Level (Shoulder Extension) Level 2 (Red)   Row Theraband;12 reps   Theraband Level (Shoulder Row) Level 2 (Red)     Manual Therapy   Manual Therapy Myofascial release   Manual therapy comments Completed separately from therapeutic exercises   Myofascial Release Myofascial release to left upper arm, trapezius, and scapularis regions to decrease pain and fascial restrictions and increase joint range of motion.                 OT Education - 04/13/17 1035    Education provided Yes   Education Details Provided pt with measurements to give to doctor; discussed adding more treatment sessions due to pt need   Person(s) Educated Patient   Methods Explanation;Handout   Comprehension Verbalized understanding          OT Short Term Goals - 04/13/17 1055      OT SHORT TERM GOAL #1   Title Pt will be educated on and independent in HEP to improve LUE functional use during daily tasks.    Time 4   Period Weeks     OT SHORT TERM GOAL #2   Title Pt will decrease pain in LUE to 4/10 to improve ability to use LUE as assist during daily tasks.    Time 4   Period Weeks   Status On-going     OT SHORT TERM GOAL #3   Title Pt will decrease fascial restrictions in LUE from moderate to minimal amounts or less to increase LUE mobility.    Time 4   Period Weeks     OT SHORT TERM GOAL #4   Title Pt will improve LUE P/ROM  to [facility] to improve ability to use LUE as assist during dressing tasks.    Time 4   Period Weeks     OT SHORT TERM GOAL #5   Title Pt will improve LUE strength to 3+/5 to improve ability to perform grooming tasks.    Time 4   Period Weeks   Status Partially Met           OT  Long Term Goals - 03/15/17 0843      OT LONG TERM GOAL #1   Title Pt will return to prior level of functioning and independence with daily and leisure task completion.    Time 8   Period Weeks   Status On-going     OT LONG TERM GOAL #2   Title Pt will decrease pain in LUE to 2/10 or less to improve ability to perform functional task completion.    Time 8   Period Weeks   Status On-going     OT LONG TERM GOAL #3   Title Pt will decrease LUE fascial restrictions from min to trace amounts to improve mobility required for overhead reaching.    Time 8   Period Weeks   Status On-going     OT LONG TERM GOAL #4   Title Pt will increase LUE A/ROM to [facility] to improve ability to perform functional reaching tasks in all planes.    Time 8   Period Weeks   Status On-going     OT LONG TERM GOAL #5   Title Pt will improve strength in LUE to 4/5 to improve ability to perform gardening tasks.    Time 8   Period Weeks   Status On-going               Plan - 2017/05/10 1037    Clinical Impression Statement A: Pain continues to be a limiting factor with daily activities. Pt requested biofreeze to ease the pain before exercises. Progressed to A/ROM standing. Completed extension and retraction with red theraband and VC were needed for form and technique. Updated G-Code this session. From reassessment: Pt feels overall left arm ROM is improved. Pain continues to be a limiting factor with all daily activities. Pt is able to place hair in ponytail using both arms although states that it is very fatiguing.    Plan P: Continue with standing A/ROM shoulder exercises and scapular strengthening with theraband. Add all shoulder  movements for theraband strengthening. Add scapular strengthening to HEP.      Patient will benefit from skilled therapeutic intervention in order to improve the following deficits and impairments:  Decreased activity tolerance, Decreased strength, Impaired flexibility, Decreased range of motion, Pain, Impaired UE functional use, Increased fascial restricitons  Visit Diagnosis: Acute pain of left shoulder  Other symptoms and signs involving the musculoskeletal system  Stiffness of left shoulder, not elsewhere classified      G-Codes - 05-10-2017 1056    Functional Assessment Tool Used (Outpatient only) clinical judgement   Functional Limitation Carrying, moving and handling objects   Carrying, Moving and Handling Objects Current Status (U9323) At least 20 percent but less than 40 percent impaired, limited or restricted   Carrying, Moving and Handling Objects Goal Status (F5732) At least 20 percent but less than 40 percent impaired, limited or restricted      Problem List Patient Active Problem List   Diagnosis Date Noted  . Osteoarthritis of left shoulder 01/13/2017  . S/P shoulder replacement, left 01/13/2017  . OSA on CPAP 04/01/2015  . Subacute confusional state 04/01/2015  . Hallucinations, visual 04/01/2015  . Hypnapompic hallucinations 04/01/2015  . Sleep apnea with use of continuous positive airway pressure (CPAP)   . Hypersomnia, persistent 03/30/2013  . Obstructive sleep apnea (adult) (pediatric) 03/30/2013  . CONSTIPATION 11/18/2008  . RECTAL BLEEDING 11/18/2008  . HEMORRHOIDS, INTERNAL 11/15/2008  . DIVERTICULOSIS, COLON 11/15/2008  . CELLULITIS/ABSCESS, TRUNK 02/22/2008  . LOW BACK PAIN  02/22/2008  . DIZZINESS 02/22/2008  . FATIGUE 02/22/2008  . PALPITATIONS 02/22/2008  . MURMUR, CARDIAC, UNDIAGNOSED 02/22/2008  . BREAST CANCER, HX OF 02/22/2008  . COUGH 01/05/2008  . DEPRESSION 07/05/2007  . UTERINE POLYP 07/05/2007  . FIBROMYALGIA 07/05/2007  . COLONIC  POLYPS, HX OF 07/05/2007  . HX, PERSONAL, CERVICAL DYSPLASIA 07/05/2007   Ailene Ravel, OTR/L,CBIS  [phone]  04/13/2017, 10:57 AM  [place] [address] [place], [place], [address] Phone: [phone]   Fax:  [phone]  Name: [name] MRN: [id number] Date of Birth: 1961-02-27

## 2017-04-18 ENCOUNTER — Telehealth (HOSPITAL_COMMUNITY): Payer: Self-pay | Admitting: Specialist

## 2017-04-18 ENCOUNTER — Ambulatory Visit (HOSPITAL_COMMUNITY): Payer: BLUE CROSS/BLUE SHIELD | Admitting: Specialist

## 2017-04-18 NOTE — Telephone Encounter (Signed)
Therapist called patient to follow up from no show for appointment on 04/18/17.  Patient stated she was out of town and forgot about the appointment.  Therapist notified patient of her next scheduled appointment on 04/22/17 at 1015. Vangie Bicker, [place], OTR/L [phone]

## 2017-04-21 ENCOUNTER — Encounter: Payer: Self-pay | Admitting: Podiatry

## 2017-04-21 ENCOUNTER — Ambulatory Visit (INDEPENDENT_AMBULATORY_CARE_PROVIDER_SITE_OTHER): Payer: BLUE CROSS/BLUE SHIELD | Admitting: Podiatry

## 2017-04-21 DIAGNOSIS — S92314K Nondisplaced fracture of first metatarsal bone, right foot, subsequent encounter for fracture with nonunion: Secondary | ICD-10-CM

## 2017-04-21 DIAGNOSIS — M19071 Primary osteoarthritis, right ankle and foot: Secondary | ICD-10-CM

## 2017-04-21 DIAGNOSIS — M2042 Other hammer toe(s) (acquired), left foot: Secondary | ICD-10-CM | POA: Diagnosis not present

## 2017-04-21 DIAGNOSIS — M2041 Other hammer toe(s) (acquired), right foot: Secondary | ICD-10-CM | POA: Diagnosis not present

## 2017-04-21 DIAGNOSIS — M205X1 Other deformities of toe(s) (acquired), right foot: Secondary | ICD-10-CM | POA: Diagnosis not present

## 2017-04-22 ENCOUNTER — Ambulatory Visit (HOSPITAL_COMMUNITY): Payer: BLUE CROSS/BLUE SHIELD

## 2017-04-22 ENCOUNTER — Encounter (HOSPITAL_COMMUNITY): Payer: Self-pay

## 2017-04-22 DIAGNOSIS — R29898 Other symptoms and signs involving the musculoskeletal system: Secondary | ICD-10-CM

## 2017-04-22 DIAGNOSIS — M25512 Pain in left shoulder: Secondary | ICD-10-CM | POA: Diagnosis not present

## 2017-04-22 DIAGNOSIS — M25612 Stiffness of left shoulder, not elsewhere classified: Secondary | ICD-10-CM

## 2017-04-22 NOTE — Therapy (Signed)
[place], [place], [address] Phone: [phone]   Fax:  [phone]  Occupational Therapy Treatment  Patient Details  Name: [name] MRN: [id number] Date of Birth: [birth year] Referring Provider: Dr. Tania Ade  Encounter Date: 04/22/2017      OT End of Session - 04/22/17 1115    Visit Number 9   Number of Visits 16   Date for OT Re-Evaluation 05/07/17   Authorization Type 1) Medicare part A  2) BCBS   Authorization Time Period Before 18th visit; no visit limit, no copay   Authorization - Visit Number 9   Authorization - Number of Visits 18   OT Start Time 1038   OT Stop Time 1116   OT Time Calculation (min) 38 min   Activity Tolerance Patient tolerated treatment well;Patient limited by pain   Behavior During Therapy [facility] for tasks assessed/performed      Past Medical History:  Diagnosis Date  . Anxiety   . Arthritis   . Breast cancer ([place])    rt breast  and left breast breast removed  . Breast reconstruction deformity   . Complication of anesthesia    shaking-after mastectomies-resp and hr dropped  . Depression, major    suicidal thoughts -implants, mental health, 05/11  . Fibromyalgia   . Headache(784.0)   . Hypersomnia    sleep attacks  . Hypothyroidism   . Migraine headache   . Narcolepsy   . Neoplasm    malignant breast, history of status post bilateral  . Post-operative nausea and vomiting   . Sleep apnea    CPAP  . Sleep apnea with use of continuous positive airway pressure (CPAP)    AHI 20 in HST, titration to 6 cm water - residual AHi 6 . remaining hypersomnic.     Past Surgical History:  Procedure Laterality Date  . Breast reconstructioin  7/09   flaps-bilat mastectomies  . BTL    . CARPAL TUNNEL RELEASE  2011   both hands  . CARPOMETACARPEL SUSPENSION PLASTY Right 08/29/2014   Procedure: REMOVAL DERMASPAN SUSPENSIONPLASTY RIGHT THUMB ABDUCTOR POLLIS TRANSFER;   Surgeon: Daryll Brod, MD;  Location: [place];  Service: Orthopedics;  Laterality: Right;  . COLONOSCOPY W/ POLYPECTOMY  4/03   tubulovillous adenoma  . FINGER ARTHROSCOPY WITH CARPOMETACARPEL [facility]) ARTHROPLASTY Right 06/20/2014   Procedure: ARTHROSCOPY RIGHT THUMB CARPOMETACARPEL GRAFT JACKET INTERPOSITION PARTIAL TRAPEZIECTOMY POSSIBLE OPEN;  Surgeon: Daryll Brod, MD;  Location: [place];  Service: Orthopedics;  Laterality: Right;  . GANGLION CYST EXCISION     both [phone]  . hysterestomy  2005  . MASTECTOMY Bilateral 2008  . post- a -cath  2008  . TOTAL SHOULDER ARTHROPLASTY Left 01/13/2017   Procedure: LEFT TOTAL SHOULDER ARTHROPLASTY;  Surgeon: Tania Ade, MD;  Location: [place];  Service: Orthopedics;  Laterality: Left;  LEFT TOTAL SHOULDER ARTHROPLASTY  . TRIGGER FINGER RELEASE Right 08/29/2014   Procedure: RELEASE TRIGGER FINGER/A-1 PULLEY;  Surgeon: Daryll Brod, MD;  Location: [place];  Service: Orthopedics;  Laterality: Right;  . wisdom teeth out      There were no vitals filed for this visit.      Subjective Assessment - 04/22/17 1042    Subjective  S: I still can't sweep the floor or reach up to a shelf to grab something.   Currently in Pain? Yes   Pain Score 4    Pain Location Shoulder   Pain  Orientation Left   Pain Descriptors / Indicators Aching;Constant   Pain Type Acute pain   Pain Radiating Towards N/A   Pain Onset More than a month ago   Pain Frequency Constant   Aggravating Factors  use   Pain Relieving Factors rest, pain medication   Effect of Pain on Daily Activities moderate   Multiple Pain Sites No            [facility] OT Assessment - 04/22/17 1045      Assessment   Diagnosis s/p left total shoulder arthroplasty     Precautions   Precautions Shoulder   Type of Shoulder Precautions Weeks 6-10 (4/20-5/18): Phase II ROM-full pain free P/ROM, begin AA/ROM and progress to A/ROM, scapular strengthening;  [id number] (5/21-6/29): Phase III-strengthening                  OT Treatments/Exercises (OP) - 04/22/17 1045      Exercises   Exercises Shoulder     Shoulder Exercises: Supine   Protraction PROM;5 reps   Horizontal ABduction PROM;5 reps   External Rotation PROM;5 reps   Internal Rotation PROM;5 reps   Flexion PROM;5 reps   ABduction PROM;5 reps     Shoulder Exercises: Standing   Protraction AROM;10 reps   Horizontal ABduction AROM;10 reps   External Rotation AROM;10 reps   Internal Rotation AROM;10 reps   Flexion AROM;10 reps   ABduction AROM;10 reps   Extension Theraband;12 reps   Theraband Level (Shoulder Extension) Level 2 (Red)   Row Theraband;12 reps   Theraband Level (Shoulder Row) Level 2 (Red)   Retraction Theraband;12 reps   Theraband Level (Shoulder Retraction) Level 2 (Red)     Shoulder Exercises: ROM/Strengthening   "W" Arms 10X   Proximal Shoulder Strengthening, Seated 10X each no rest breaks, standing     Manual Therapy   Manual Therapy Myofascial release   Manual therapy comments Completed separately from therapeutic exercises   Myofascial Release Myofascial release to left upper arm, trapezius, and scapularis regions to decrease pain and fascial restrictions and increase joint range of motion.                 OT Education - 04/22/17 1114    Education provided Yes   Education Details Provided pt with scapular theraband exercises   Person(s) Educated Patient   Methods Explanation;Demonstration;Verbal cues;Handout   Comprehension Verbalized understanding;Returned demonstration          OT Short Term Goals - 04/13/17 1055      OT SHORT TERM GOAL #1   Title Pt will be educated on and independent in HEP to improve LUE functional use during daily tasks.    Time 4   Period Weeks     OT SHORT TERM GOAL #2   Title Pt will decrease pain in LUE to 4/10 to improve ability to use LUE as assist during daily tasks.    Time 4   Period  Weeks   Status On-going     OT SHORT TERM GOAL #3   Title Pt will decrease fascial restrictions in LUE from moderate to minimal amounts or less to increase LUE mobility.    Time 4   Period Weeks     OT SHORT TERM GOAL #4   Title Pt will improve LUE P/ROM to [facility] to improve ability to use LUE as assist during dressing tasks.    Time 4   Period Weeks     OT SHORT TERM GOAL #5  Title Pt will improve LUE strength to 3+/5 to improve ability to perform grooming tasks.    Time 4   Period Weeks   Status Partially Met           OT Long Term Goals - 03/15/17 0843      OT LONG TERM GOAL #1   Title Pt will return to prior level of functioning and independence with daily and leisure task completion.    Time 8   Period Weeks   Status On-going     OT LONG TERM GOAL #2   Title Pt will decrease pain in LUE to 2/10 or less to improve ability to perform functional task completion.    Time 8   Period Weeks   Status On-going     OT LONG TERM GOAL #3   Title Pt will decrease LUE fascial restrictions from min to trace amounts to improve mobility required for overhead reaching.    Time 8   Period Weeks   Status On-going     OT LONG TERM GOAL #4   Title Pt will increase LUE A/ROM to [facility] to improve ability to perform functional reaching tasks in all planes.    Time 8   Period Weeks   Status On-going     OT LONG TERM GOAL #5   Title Pt will improve strength in LUE to 4/5 to improve ability to perform gardening tasks.    Time 8   Period Weeks   Status On-going               Plan - 04/22/17 1120    Clinical Impression Statement A: P/ROM and A/ROM completed with pain continuing to be a limiting factor during exercises. Completed proximal shoulder strengthening and therabnd exercises with VC needed for orm anc technique. Added shoulder retraction with theraband. Pt requested biofreeze during session and applied it to shoulder and scapular region.   Plan P: Add all shoulder  movements for theraband strengthening. Continue A/ROM standing exercises.      Patient will benefit from skilled therapeutic intervention in order to improve the following deficits and impairments:  Decreased activity tolerance, Decreased strength, Impaired flexibility, Decreased range of motion, Pain, Impaired UE functional use, Increased fascial restricitons  Visit Diagnosis: Acute pain of left shoulder  Other symptoms and signs involving the musculoskeletal system  Stiffness of left shoulder, not elsewhere classified    Problem List Patient Active Problem List   Diagnosis Date Noted  . Osteoarthritis of left shoulder 01/13/2017  . S/P shoulder replacement, left 01/13/2017  . OSA on CPAP 04/01/2015  . Subacute confusional state 04/01/2015  . Hallucinations, visual 04/01/2015  . Hypnapompic hallucinations 04/01/2015  . Sleep apnea with use of continuous positive airway pressure (CPAP)   . Hypersomnia, persistent 03/30/2013  . Obstructive sleep apnea (adult) (pediatric) 03/30/2013  . CONSTIPATION 11/18/2008  . RECTAL BLEEDING 11/18/2008  . HEMORRHOIDS, INTERNAL 11/15/2008  . DIVERTICULOSIS, COLON 11/15/2008  . CELLULITIS/ABSCESS, TRUNK 02/22/2008  . LOW BACK PAIN 02/22/2008  . DIZZINESS 02/22/2008  . FATIGUE 02/22/2008  . PALPITATIONS 02/22/2008  . MURMUR, CARDIAC, UNDIAGNOSED 02/22/2008  . BREAST CANCER, HX OF 02/22/2008  . COUGH 01/05/2008  . DEPRESSION 07/05/2007  . UTERINE POLYP 07/05/2007  . FIBROMYALGIA 07/05/2007  . COLONIC POLYPS, HX OF 07/05/2007  . HX, PERSONAL, CERVICAL DYSPLASIA 07/05/2007    Nettie Elm Student [phone] 04/22/2017, 11:28 AM  [place], [place], [address] Phone: [phone]  Fax:  [phone]  Name: [name] MRN: [id number] Date of Birth: [birth year]   Note reviewed by clinical instructor and accurately reflects treatment session.   Ailene Ravel, OTR/L,CBIS  [phone]

## 2017-04-22 NOTE — Patient Instructions (Signed)

## 2017-04-24 NOTE — Progress Notes (Signed)
Subjective: 56 year old female presents the office they for second opinion regarding right foot pain. She previously underwent surgery with Dr. Paulla Dolly in the fall of last year. On 08/31/2016 Se underwent a bi-plantar osteotomy of the right 1st metatarsal. At that time she states that she had a "bunion" that needed to be fixed. Since returning tissues she states that she has noticed that her second toe has started to drift to the side as well as that it sit "flat" compared to the other toes. She is also noted some swelling to the third toe. She states that she does not have any pain along the surgical site itself and her concern is the other digits of a change since the surgery. She denies any recent injury or trauma. She has a complex surgical history involving other issues. Denies any systemic complaints such as fevers, chills, nausea, vomiting. No acute changes since last appointment, and no other complaints at this time.   Objective: AAO x3, NAD, presents today wearing a regular type sandal DP/PT pulses palpable bilaterally, CRT less than 3 seconds Protective sensation intact with Simms Weinstein monofilament Scar from the prior surgery is well-healed. There is no pain in the first metatarsal to the first metatarsal phalangeal joint. The first MTPJ range of motion is decreased however. There is faint edema to this area that there is no erythema or increase in warmth. The majority of her symptoms to be localized to the second toe. Her main concern is of the second toe was drifted. There is transverse plane deformity of the second toe appears to be more at the level of the DIPJ. There is lateral deviation of the digit mildly. Also there is action contracture of the right third DIPJ with mild irritation to this area. There is no significant discomfort to the area. There does get a callus the plantar aspect of the second toe there is no open sores identified. There is no edema, erythema, drainage or pus or any  clinical signs of infection. No areas of pinpoint bony tenderness or pain with vibratory sensation. MMT 5/5, ROM WNL. No edema, erythema, increase in warmth to bilateral lower extremities.  No open lesions or pre-ulcerative lesions.  No pain with calf compression, swelling, warmth, erythema  Assessment: Continued right foot pain, digital deformity status post biplanar osteotomy the first metatarsal.  Plan: -All treatment options discussed with the patient including all alternatives, risks, complications.  I reviewed the x-rays and the patient. We had a long discussion and I reviewed her x-rays throughout the whole process with her. Upon evaluation of her last postop x-rays there is nonhealing, nonunion of the first metatarsal. However clinically she does not have any symptoms this area other than decreased range of motion which I think would being there anyway even if the osteotomy had healed. Her main concerns to the second and third toes. I cannot tell she's doing this more for cosmetic reasons were for pain. Discussed with her if she elected to go under further surgery and discussed the second metatarsal ostomy to shorten the metatarsal as well as toe repair. Also discussed arthroplasty of the third DIPJ. Also discussed repair of the nonunion the first metatarsal.  -I discussed with the surgery as well as the postoperative course. At this time I want her to think about her options before proceeding with further surgery. I will also discussed the case with Dr. Paulla Dolly who would be doing her surgery.  -I discussed with her a 1st MTPJ arthrodesis as well given  the arthritis but joint however she is not even wanting the K wires in the metatarsal as it is and I do not think that she would tolerate a fusion of the joint. Discussed later on she could have a implant if needed or effusion but this time her symptoms are not to the first MPJ to therefore I would hold off on the fusion knowing that she may need  his mother surgery later on in life -If she undergo surgery she does not want to do this until the fall. Should to go him to some chronic treatment now. Dispensed offloading pads and also given the nonunion of the first metatarsal we will try bone stimulator.I will contact the representative for this.  -Patient encouraged to call the office with any questions, concerns, change in symptoms.  -I spent 30 min in face to face time with the patient discussing her condition, x-rays and treatment options.   Celesta Gentile, DPM

## 2017-04-25 ENCOUNTER — Telehealth: Payer: Self-pay | Admitting: *Deleted

## 2017-04-27 ENCOUNTER — Ambulatory Visit (HOSPITAL_COMMUNITY): Payer: BLUE CROSS/BLUE SHIELD | Admitting: Occupational Therapy

## 2017-04-27 ENCOUNTER — Telehealth (HOSPITAL_COMMUNITY): Payer: Self-pay | Admitting: Internal Medicine

## 2017-04-27 NOTE — Telephone Encounter (Signed)
04/27/17  Pt left a message at 10:35 to cx said she didn't feel like coming in today

## 2017-04-29 ENCOUNTER — Ambulatory Visit (HOSPITAL_COMMUNITY): Payer: BLUE CROSS/BLUE SHIELD

## 2017-04-29 ENCOUNTER — Encounter: Payer: Self-pay | Admitting: Internal Medicine

## 2017-04-29 ENCOUNTER — Encounter (HOSPITAL_COMMUNITY): Payer: Self-pay

## 2017-04-29 DIAGNOSIS — R29898 Other symptoms and signs involving the musculoskeletal system: Secondary | ICD-10-CM

## 2017-04-29 DIAGNOSIS — M25512 Pain in left shoulder: Secondary | ICD-10-CM | POA: Diagnosis not present

## 2017-04-29 DIAGNOSIS — M25612 Stiffness of left shoulder, not elsewhere classified: Secondary | ICD-10-CM

## 2017-04-29 NOTE — Therapy (Signed)
[place], [place], [address] Phone: [phone]   Fax:  [phone]  Occupational Therapy Treatment  Patient Details  Name: [name] MRN: [id number] Date of Birth: [birth year] Referring Provider: Dr. Tania Ade  Encounter Date: 04/29/2017      OT End of Session - 04/29/17 1218    Visit Number 10   Number of Visits 16   Date for OT Re-Evaluation 05/07/17   Authorization Type 1) Medicare part A  2) BCBS   Authorization Time Period Before 18th visit; no visit limit, no copay   Authorization - Visit Number 10   Authorization - Number of Visits 18   OT Start Time 1040   OT Stop Time 1118   OT Time Calculation (min) 38 min   Activity Tolerance Patient limited by pain;Patient tolerated treatment well   Behavior During Therapy [facility] for tasks assessed/performed      Past Medical History:  Diagnosis Date  . Anxiety   . Arthritis   . Breast cancer ([place])    rt breast  and left breast breast removed  . Breast reconstruction deformity   . Complication of anesthesia    shaking-after mastectomies-resp and hr dropped  . Depression, major    suicidal thoughts -implants, mental health, 05/11  . Fibromyalgia   . Headache(784.0)   . Hypersomnia    sleep attacks  . Hypothyroidism   . Migraine headache   . Narcolepsy   . Neoplasm    malignant breast, history of status post bilateral  . Post-operative nausea and vomiting   . Sleep apnea    CPAP  . Sleep apnea with use of continuous positive airway pressure (CPAP)    AHI 20 in HST, titration to 6 cm water - residual AHi 6 . remaining hypersomnic.     Past Surgical History:  Procedure Laterality Date  . Breast reconstructioin  7/09   flaps-bilat mastectomies  . BTL    . CARPAL TUNNEL RELEASE  2011   both hands  . CARPOMETACARPEL SUSPENSION PLASTY Right 08/29/2014   Procedure: REMOVAL DERMASPAN SUSPENSIONPLASTY RIGHT THUMB ABDUCTOR POLLIS TRANSFER;   Surgeon: Daryll Brod, MD;  Location: [place];  Service: Orthopedics;  Laterality: Right;  . COLONOSCOPY W/ POLYPECTOMY  4/03   tubulovillous adenoma  . FINGER ARTHROSCOPY WITH CARPOMETACARPEL [facility]) ARTHROPLASTY Right 06/20/2014   Procedure: ARTHROSCOPY RIGHT THUMB CARPOMETACARPEL GRAFT JACKET INTERPOSITION PARTIAL TRAPEZIECTOMY POSSIBLE OPEN;  Surgeon: Daryll Brod, MD;  Location: [facility];  Service: Orthopedics;  Laterality: Right;  . GANGLION CYST EXCISION     both [id number]  . hysterestomy  2005  . MASTECTOMY Bilateral 2008  . post- a -cath  2008  . TOTAL SHOULDER ARTHROPLASTY Left 01/13/2017   Procedure: LEFT TOTAL SHOULDER ARTHROPLASTY;  Surgeon: Tania Ade, MD;  Location: [place];  Service: Orthopedics;  Laterality: Left;  LEFT TOTAL SHOULDER ARTHROPLASTY  . TRIGGER FINGER RELEASE Right 08/29/2014   Procedure: RELEASE TRIGGER FINGER/A-1 PULLEY;  Surgeon: Daryll Brod, MD;  Location: [place];  Service: Orthopedics;  Laterality: Right;  . wisdom teeth out      There were no vitals filed for this visit.      Subjective Assessment - 04/29/17 1046    Subjective  S: I'm not having to take my pain medicine much anymore.   Currently in Pain? Yes   Pain Score 4    Pain Location Shoulder   Pain Orientation Left   Pain  Descriptors / Indicators Aching;Constant   Pain Type Acute pain   Pain Radiating Towards N/A   Pain Onset More than a month ago   Pain Frequency Constant   Aggravating Factors  use   Pain Relieving Factors rest, medication   Effect of Pain on Daily Activities moderate   Multiple Pain Sites No            [facility] OT Assessment - 04/29/17 1047      Assessment   Diagnosis s/p left total shoulder arthroplasty     Precautions   Precautions Shoulder   Type of Shoulder Precautions Weeks 6-10 (4/20-5/18): Phase II ROM-full pain free P/ROM, begin AA/ROM and progress to A/ROM, scapular strengthening; [id number] (5/21-6/29):  Phase III-strengthening                  OT Treatments/Exercises (OP) - 04/29/17 1047      Exercises   Exercises Shoulder     Shoulder Exercises: Supine   Protraction PROM;5 reps   Horizontal ABduction PROM;5 reps   External Rotation PROM;5 reps   Internal Rotation PROM;5 reps   Flexion PROM;5 reps   ABduction PROM;5 reps     Shoulder Exercises: Standing   Protraction (P)  AROM;12 reps;Theraband;10 reps   Theraband Level (Shoulder Protraction) (P)  Level 2 (Red)   Horizontal ABduction (P)  AROM;12 reps;Theraband;10 reps   Theraband Level (Shoulder Horizontal ABduction) (P)  Level 2 (Red)   External Rotation (P)  AROM;12 reps;Theraband;10 reps   Theraband Level (Shoulder External Rotation) (P)  Level 2 (Red)   Internal Rotation (P)  AROM;12 reps;Theraband;10 reps   Theraband Level (Shoulder Internal Rotation) (P)  Level 2 (Red)   Flexion (P)  AROM;12 reps;Theraband;10 reps   Theraband Level (Shoulder Flexion) (P)  Level 2 (Red)   ABduction (P)  AROM;12 reps;Theraband;10 reps   Theraband Level (Shoulder ABduction) (P)  Level 2 (Red)   Extension (P)  Theraband;12 reps   Theraband Level (Shoulder Extension) (P)  Level 2 (Red)   Row (P)  Theraband;12 reps   Theraband Level (Shoulder Row) (P)  Level 2 (Red)   Retraction (P)  Theraband;12 reps   Theraband Level (Shoulder Retraction) (P)  Level 2 (Red)     Shoulder Exercises: ROM/Strengthening   "W" Arms (P)  12X   X to V Arms (P)  12X   Proximal Shoulder Strengthening, Seated (P)  10X each no rest breaks, standing                  OT Short Term Goals - 04/13/17 1055      OT SHORT TERM GOAL #1   Title Pt will be educated on and independent in HEP to improve LUE functional use during daily tasks.    Time 4   Period Weeks     OT SHORT TERM GOAL #2   Title Pt will decrease pain in LUE to 4/10 to improve ability to use LUE as assist during daily tasks.    Time 4   Period Weeks   Status On-going     OT  SHORT TERM GOAL #3   Title Pt will decrease fascial restrictions in LUE from moderate to minimal amounts or less to increase LUE mobility.    Time 4   Period Weeks     OT SHORT TERM GOAL #4   Title Pt will improve LUE P/ROM to [facility] to improve ability to use LUE as assist during dressing tasks.    Time 4  Period Weeks     OT SHORT TERM GOAL #5   Title Pt will improve LUE strength to 3+/5 to improve ability to perform grooming tasks.    Time 4   Period Weeks   Status Partially Met           OT Long Term Goals - 03/15/17 0843      OT LONG TERM GOAL #1   Title Pt will return to prior level of functioning and independence with daily and leisure task completion.    Time 8   Period Weeks   Status On-going     OT LONG TERM GOAL #2   Title Pt will decrease pain in LUE to 2/10 or less to improve ability to perform functional task completion.    Time 8   Period Weeks   Status On-going     OT LONG TERM GOAL #3   Title Pt will decrease LUE fascial restrictions from min to trace amounts to improve mobility required for overhead reaching.    Time 8   Period Weeks   Status On-going     OT LONG TERM GOAL #4   Title Pt will increase LUE A/ROM to [facility] to improve ability to perform functional reaching tasks in all planes.    Time 8   Period Weeks   Status On-going     OT LONG TERM GOAL #5   Title Pt will improve strength in LUE to 4/5 to improve ability to perform gardening tasks.    Time 8   Period Weeks   Status On-going               Plan - 04/29/17 1220    Clinical Impression Statement A: Pt completed A/ROM shoulder exercises but required break during horizontal abduction due to pain. Added all shoulder theraband exercises and X to V arms with VC needed for form and technique. Pt presented with decreased fascial restrictions during today's session.   Plan P: Continue with all shoulder theraband exercises, add to HEP if pt presents with good form. Add arm bike for 2' at  end of session.      Patient will benefit from skilled therapeutic intervention in order to improve the following deficits and impairments:  Decreased activity tolerance, Decreased strength, Impaired flexibility, Decreased range of motion, Pain, Impaired UE functional use, Increased fascial restricitons  Visit Diagnosis: Acute pain of left shoulder  Other symptoms and signs involving the musculoskeletal system  Stiffness of left shoulder, not elsewhere classified    Problem List Patient Active Problem List   Diagnosis Date Noted  . Osteoarthritis of left shoulder 01/13/2017  . S/P shoulder replacement, left 01/13/2017  . OSA on CPAP 04/01/2015  . Subacute confusional state 04/01/2015  . Hallucinations, visual 04/01/2015  . Hypnapompic hallucinations 04/01/2015  . Sleep apnea with use of continuous positive airway pressure (CPAP)   . Hypersomnia, persistent 03/30/2013  . Obstructive sleep apnea (adult) (pediatric) 03/30/2013  . CONSTIPATION 11/18/2008  . RECTAL BLEEDING 11/18/2008  . HEMORRHOIDS, INTERNAL 11/15/2008  . DIVERTICULOSIS, COLON 11/15/2008  . CELLULITIS/ABSCESS, TRUNK 02/22/2008  . LOW BACK PAIN 02/22/2008  . DIZZINESS 02/22/2008  . FATIGUE 02/22/2008  . PALPITATIONS 02/22/2008  . MURMUR, CARDIAC, UNDIAGNOSED 02/22/2008  . BREAST CANCER, HX OF 02/22/2008  . COUGH 01/05/2008  . DEPRESSION 07/05/2007  . UTERINE POLYP 07/05/2007  . FIBROMYALGIA 07/05/2007  . COLONIC POLYPS, HX OF 07/05/2007  . HX, PERSONAL, CERVICAL DYSPLASIA 07/05/2007    Luther Hearing, OT Student [phone]  04/29/2017, 12:23 PM  [place] [address] [place], [place], [address] Phone: [phone]   Fax:  [phone]  Name: [name] MRN: [id number] Date of Birth: [birth year]     This qualified practitioner was present in the room guiding the student in service delivery. Therapy student was participating in the provision of  services, and the practitioner was not engaged in treating another patient or doing other tasks at the same time.  Ailene Ravel, OTR/L,CBIS  [phone]

## 2017-05-02 ENCOUNTER — Encounter (HOSPITAL_COMMUNITY): Payer: Self-pay | Admitting: Occupational Therapy

## 2017-05-02 ENCOUNTER — Ambulatory Visit (HOSPITAL_COMMUNITY): Payer: BLUE CROSS/BLUE SHIELD | Admitting: Occupational Therapy

## 2017-05-02 DIAGNOSIS — R29898 Other symptoms and signs involving the musculoskeletal system: Secondary | ICD-10-CM

## 2017-05-02 DIAGNOSIS — M25512 Pain in left shoulder: Secondary | ICD-10-CM | POA: Diagnosis not present

## 2017-05-02 DIAGNOSIS — M25612 Stiffness of left shoulder, not elsewhere classified: Secondary | ICD-10-CM

## 2017-05-02 NOTE — Patient Instructions (Signed)
Theraband strengthening: Complete 10-15X, 1-2X/day  1) Shoulder protraction  Anchor band in doorway, stand with back to door. Push your hand forward as much as you can to bringing your shoulder blades forward on your rib cage.     2) Shoulder flexion  While standing with back to the door, holding Theraband at hand level, raise arm in front of you.  Keep elbow straight through entire movement.      3) Shoulder horizontal abduction  Standing with a theraband anchored at chest height, begin with arm straight and some tension in the band. Move your arm out to your side (keeping straight the whole time). Bring the affected arm back to midline.     4) Shoulder Internal Rotation  While holding an elastic band at your side with your elbow bent, start with your hand away from your stomach, then pull the band towards your stomach. Keep your elbow near your side the entire time.     5) Shoulder External Rotation  While holding an elastic band at your side with your elbow bent, start with your hand near your stomach and then pull the band away. Keep your elbow at your side the entire time.     6) Shoulder abduction  While holding an elastic band at your side, draw up your arm to the side keeping your elbow straight.   

## 2017-05-02 NOTE — Therapy (Addendum)
[place], [place], [address] Phone: [phone]   Fax:  [phone]  Occupational Therapy Treatment  Patient Details  Name: [name] MRN: [id number] Date of Birth: [birth year] Referring Provider: Dr. Tania Ade  Encounter Date: 05/02/2017      OT End of Session - 05/02/17 1251    Visit Number 11   Number of Visits 16   Date for OT Re-Evaluation 05/07/17   Authorization Type 1) Medicare part A  2) BCBS   Authorization Time Period Before 18th visit; no visit limit, no copay   Authorization - Visit Number 11   Authorization - Number of Visits 18   OT Start Time 9518  pt arrived late   OT Stop Time 1114   OT Time Calculation (min) 38 min   Activity Tolerance Patient limited by pain;Patient tolerated treatment well   Behavior During Therapy [facility] for tasks assessed/performed      Past Medical History:  Diagnosis Date  . Anxiety   . Arthritis   . Breast cancer ([place])    rt breast  and left breast breast removed  . Breast reconstruction deformity   . Complication of anesthesia    shaking-after mastectomies-resp and hr dropped  . Depression, major    suicidal thoughts -implants, mental health, 05/11  . Fibromyalgia   . Headache(784.0)   . Hypersomnia    sleep attacks  . Hypothyroidism   . Migraine headache   . Narcolepsy   . Neoplasm    malignant breast, history of status post bilateral  . Post-operative nausea and vomiting   . Sleep apnea    CPAP  . Sleep apnea with use of continuous positive airway pressure (CPAP)    AHI 20 in HST, titration to 6 cm water - residual AHi 6 . remaining hypersomnic.     Past Surgical History:  Procedure Laterality Date  . Breast reconstructioin  7/09   flaps-bilat mastectomies  . BTL    . CARPAL TUNNEL RELEASE  2011   both hands  . CARPOMETACARPEL SUSPENSION PLASTY Right 08/29/2014   Procedure: REMOVAL DERMASPAN SUSPENSIONPLASTY RIGHT THUMB ABDUCTOR  POLLIS TRANSFER;  Surgeon: Daryll Brod, MD;  Location: [facility];  Service: Orthopedics;  Laterality: Right;  . COLONOSCOPY W/ POLYPECTOMY  4/03   tubulovillous adenoma  . FINGER ARTHROSCOPY WITH CARPOMETACARPEL [facility]) ARTHROPLASTY Right 06/20/2014   Procedure: ARTHROSCOPY RIGHT THUMB CARPOMETACARPEL GRAFT JACKET INTERPOSITION PARTIAL TRAPEZIECTOMY POSSIBLE OPEN;  Surgeon: Daryll Brod, MD;  Location: [facility];  Service: Orthopedics;  Laterality: Right;  . GANGLION CYST EXCISION     both [id number]  . hysterestomy  2005  . MASTECTOMY Bilateral 2008  . post- a -cath  2008  . TOTAL SHOULDER ARTHROPLASTY Left 01/13/2017   Procedure: LEFT TOTAL SHOULDER ARTHROPLASTY;  Surgeon: Tania Ade, MD;  Location: [place];  Service: Orthopedics;  Laterality: Left;  LEFT TOTAL SHOULDER ARTHROPLASTY  . TRIGGER FINGER RELEASE Right 08/29/2014   Procedure: RELEASE TRIGGER FINGER/A-1 PULLEY;  Surgeon: Daryll Brod, MD;  Location: [place];  Service: Orthopedics;  Laterality: Right;  . wisdom teeth out      There were no vitals filed for this visit.      Subjective Assessment - 05/02/17 1038    Subjective  S: The pain is staying at about the same level.   Currently in Pain? Yes   Pain Score 4    Pain Location Shoulder   Pain Orientation Left  Pain Descriptors / Indicators Aching   Pain Type Acute pain   Pain Radiating Towards n/a   Pain Onset More than a month ago   Pain Frequency Constant   Aggravating Factors  use   Pain Relieving Factors rest, medication   Effect of Pain on Daily Activities moderate   Multiple Pain Sites No            [facility] OT Assessment - 05/02/17 1038      Assessment   Diagnosis s/p left total shoulder arthroplasty     Precautions   Precautions Shoulder   Type of Shoulder Precautions Weeks 6-10 (4/20-5/18): Phase II ROM-full pain free P/ROM, begin AA/ROM and progress to A/ROM, scapular strengthening; [id number] (5/21-6/29):  Phase III-strengthening                  OT Treatments/Exercises (OP) - 05/02/17 1040      Exercises   Exercises Shoulder     Shoulder Exercises: Supine   Protraction PROM;5 reps   Horizontal ABduction PROM;5 reps   External Rotation PROM;5 reps   Internal Rotation PROM;5 reps   Flexion PROM;5 reps   ABduction PROM;5 reps     Shoulder Exercises: Standing   Protraction Theraband;10 reps   Theraband Level (Shoulder Protraction) Level 2 (Red)   Horizontal ABduction Theraband;10 reps   Theraband Level (Shoulder Horizontal ABduction) Level 2 (Red)   External Rotation Theraband;10 reps   Theraband Level (Shoulder External Rotation) Level 2 (Red)   Internal Rotation Theraband;10 reps   Theraband Level (Shoulder Internal Rotation) Level 2 (Red)   Flexion Theraband;10 reps   Theraband Level (Shoulder Flexion) Level 2 (Red)   ABduction Theraband;10 reps   Theraband Level (Shoulder ABduction) Level 2 (Red)   Extension Theraband;10 reps   Theraband Level (Shoulder Extension) Level 2 (Red)   Row Theraband;10 reps   Theraband Level (Shoulder Row) Level 2 (Red)   Retraction Theraband;10 reps   Theraband Level (Shoulder Retraction) Level 2 (Red)     Shoulder Exercises: ROM/Strengthening   UBE (Upper Arm Bike) Level 1 1'30" reverse   "W" Arms unable to complete   X to V Arms 10X     Manual Therapy   Manual Therapy Myofascial release   Manual therapy comments Completed separately from therapeutic exercises   Myofascial Release Myofascial release to left upper arm, trapezius, and scapularis regions to decrease pain and fascial restrictions and increase joint range of motion.                 OT Education - 05/02/17 1251    Education provided Yes   Education Details red theraband shoulder strengthening   Person(s) Educated Patient   Methods Explanation;Demonstration;Handout   Comprehension Verbalized understanding;Returned demonstration          OT Short Term  Goals - 04/13/17 1055      OT SHORT TERM GOAL #1   Title Pt will be educated on and independent in HEP to improve LUE functional use during daily tasks.    Time 4   Period Weeks     OT SHORT TERM GOAL #2   Title Pt will decrease pain in LUE to 4/10 to improve ability to use LUE as assist during daily tasks.    Time 4   Period Weeks   Status On-going     OT SHORT TERM GOAL #3   Title Pt will decrease fascial restrictions in LUE from moderate to minimal amounts or less to increase LUE mobility.  Time 4   Period Weeks     OT SHORT TERM GOAL #4   Title Pt will improve LUE P/ROM to [facility] to improve ability to use LUE as assist during dressing tasks.    Time 4   Period Weeks     OT SHORT TERM GOAL #5   Title Pt will improve LUE strength to 3+/5 to improve ability to perform grooming tasks.    Time 4   Period Weeks   Status Partially Met           OT Long Term Goals - 03/15/17 0843      OT LONG TERM GOAL #1   Title Pt will return to prior level of functioning and independence with daily and leisure task completion.    Time 8   Period Weeks   Status On-going     OT LONG TERM GOAL #2   Title Pt will decrease pain in LUE to 2/10 or less to improve ability to perform functional task completion.    Time 8   Period Weeks   Status On-going     OT LONG TERM GOAL #3   Title Pt will decrease LUE fascial restrictions from min to trace amounts to improve mobility required for overhead reaching.    Time 8   Period Weeks   Status On-going     OT LONG TERM GOAL #4   Title Pt will increase LUE A/ROM to [facility] to improve ability to perform functional reaching tasks in all planes.    Time 8   Period Weeks   Status On-going     OT LONG TERM GOAL #5   Title Pt will improve strength in LUE to 4/5 to improve ability to perform gardening tasks.    Time 8   Period Weeks   Status On-going               Plan - 05/02/17 1252    Clinical Impression Statement A: Pt completed  shoulder strengthening with red theraband, increased time required, intermittent verbal cuing for form. Added UBE, pt only able to tolerate 1'30" before needed to stop due to right arm fatigue/burning. Provided updated HEP for shoulder strengthening with theraband.    Plan P: Follow up on HEP. Reassessment   OT Home Exercise Plan 5/1: table slides; 5/8: scapular A/ROM; 6/4: Pt progressed to A/ROM shoulder exercises; 6/25: red theraband shoulder strengthening      Patient will benefit from skilled therapeutic intervention in order to improve the following deficits and impairments:  Decreased activity tolerance, Decreased strength, Impaired flexibility, Decreased range of motion, Pain, Impaired UE functional use, Increased fascial restricitons  Visit Diagnosis: Acute pain of left shoulder  Other symptoms and signs involving the musculoskeletal system  Stiffness of left shoulder, not elsewhere classified    Problem List Patient Active Problem List   Diagnosis Date Noted  . Osteoarthritis of left shoulder 01/13/2017  . S/P shoulder replacement, left 01/13/2017  . OSA on CPAP 04/01/2015  . Subacute confusional state 04/01/2015  . Hallucinations, visual 04/01/2015  . Hypnapompic hallucinations 04/01/2015  . Sleep apnea with use of continuous positive airway pressure (CPAP)   . Hypersomnia, persistent 03/30/2013  . Obstructive sleep apnea (adult) (pediatric) 03/30/2013  . CONSTIPATION 11/18/2008  . RECTAL BLEEDING 11/18/2008  . HEMORRHOIDS, INTERNAL 11/15/2008  . DIVERTICULOSIS, COLON 11/15/2008  . CELLULITIS/ABSCESS, TRUNK 02/22/2008  . LOW BACK PAIN 02/22/2008  . DIZZINESS 02/22/2008  . FATIGUE 02/22/2008  . PALPITATIONS 02/22/2008  . MURMUR,  CARDIAC, UNDIAGNOSED 02/22/2008  . BREAST CANCER, HX OF 02/22/2008  . COUGH 01/05/2008  . DEPRESSION 07/05/2007  . UTERINE POLYP 07/05/2007  . FIBROMYALGIA 07/05/2007  . COLONIC POLYPS, HX OF 07/05/2007  . HX, PERSONAL, CERVICAL DYSPLASIA  07/05/2007   Guadelupe Sabin, OTR/L  [phone] 05/02/2017, 12:54 PM  [facility] [address] [place], [place], [address] Phone: [phone]   Fax:  [phone]  Name: [name] MRN: [id number] Date of Birth: August 25, 1961    OCCUPATIONAL THERAPY DISCHARGE SUMMARY  Visits from Start of Care: 11  Current functional level related to goals / functional outcomes: Pt is being discharged due to not returning to therapy after last visit on 6/25. At that time pt was able to use LUE functionally and had met 3/5 STGs with an additional partly met. Current LUE functioning is unknown.    Remaining deficits: At last visit pt had continued deficits with ROM, pain, and strength.    Education / Equipment: At last visit pt had been educated on A/ROM and theraband exercises  Plan: Patient agrees to discharge.  Patient goals were partially met. Patient is being discharged due to not returning since the last visit.  ?????

## 2017-05-06 ENCOUNTER — Ambulatory Visit (HOSPITAL_COMMUNITY): Payer: BLUE CROSS/BLUE SHIELD

## 2017-05-06 ENCOUNTER — Telehealth (HOSPITAL_COMMUNITY): Payer: Self-pay | Admitting: Internal Medicine

## 2017-05-06 NOTE — Telephone Encounter (Signed)
05/06/17 pt cx said she would call back to reschedule - no reason was given

## 2017-05-13 ENCOUNTER — Ambulatory Visit (AMBULATORY_SURGERY_CENTER): Payer: BLUE CROSS/BLUE SHIELD | Admitting: Internal Medicine

## 2017-05-13 ENCOUNTER — Encounter: Payer: Self-pay | Admitting: Internal Medicine

## 2017-05-13 VITALS — BP 126/72 | HR 67 | Temp 97.8°F | Resp 18 | Ht 64.0 in | Wt 175.0 lb

## 2017-05-13 DIAGNOSIS — D123 Benign neoplasm of transverse colon: Secondary | ICD-10-CM

## 2017-05-13 DIAGNOSIS — Z86010 Personal history of colonic polyps: Secondary | ICD-10-CM | POA: Diagnosis present

## 2017-05-13 DIAGNOSIS — K5901 Slow transit constipation: Secondary | ICD-10-CM

## 2017-05-13 MED ORDER — SODIUM CHLORIDE 0.9 % IV SOLN
500.0000 mL | INTRAVENOUS | Status: DC
Start: 2017-05-13 — End: 2017-07-21

## 2017-05-13 NOTE — Op Note (Signed)
[facility] Patient Name: [name] Procedure Date: 05/13/2017 9:47 AM MRN: [id number] Endoscopist: Docia Chuck. Henrene Pastor , MD Age: 56 Referring MD:  Date of Birth: 01-Feb-1961 Gender: Female Account #: [id number] Procedure:                Colonoscopy, With cold snare polypectomy x 1 Indications:              High risk colon cancer surveillance: Personal                            history of adenoma with villous component Medicines:                Monitored Anesthesia Care Procedure:                Pre-Anesthesia Assessment:                           - Prior to the procedure, a History and Physical                            was performed, and patient medications and                            allergies were reviewed. The patient's tolerance of                            previous anesthesia was also reviewed. The risks                            and benefits of the procedure and the sedation                            options and risks were discussed with the patient.                            All questions were answered, and informed consent                            was obtained. Prior Anticoagulants: The patient has                            taken no previous anticoagulant or antiplatelet                            agents. ASA Grade Assessment: II - A patient with                            mild systemic disease. After reviewing the risks                            and benefits, the patient was deemed in                            satisfactory condition to undergo the procedure.  After obtaining informed consent, the colonoscope                            was passed under direct vision. Throughout the                            procedure, the patient's blood pressure, pulse, and                            oxygen saturations were monitored continuously. The                            Model CF-HQ190L [id number]) scope was introduced            through the anus and advanced to the the cecum,                            identified by appendiceal orifice and ileocecal                            valve. The ileocecal valve, appendiceal orifice,                            and rectum were photographed. The quality of the                            bowel preparation was excellent. The colonoscopy                            was performed without difficulty. The patient                            tolerated the procedure well. The bowel preparation                            used was SUPREP. Scope In: 9:59:11 AM Scope Out: 10:13:42 AM Scope Withdrawal Time: 0 hours 11 minutes 35 seconds  Total Procedure Duration: 0 hours 14 minutes 31 seconds  Findings:                 A 4 mm polyp was found in the proximal transverse                            colon. The polyp was removed with a cold snare.                            Resection and retrieval were complete.                           Diverticula were found in the left colon and right                            colon.  Internal hemorrhoids were found during retroflexion.                           The exam was otherwise without abnormality on                            direct and retroflexion views. Complications:            No immediate complications. Estimated blood loss:                            None. Estimated Blood Loss:     Estimated blood loss: none. Impression:               - One 4 mm polyp in the proximal transverse colon,                            removed with a cold snare. Resected and retrieved.                           - Diverticulosis in the left colon and in the right                            colon.                           - Internal hemorrhoids.                           - The examination was otherwise normal on direct                            and retroflexion views. Recommendation:           - Repeat colonoscopy in 5 years for  surveillance.                           - Patient has a contact number available for                            emergencies. The signs and symptoms of potential                            delayed complications were discussed with the                            patient. Return to normal activities tomorrow.                            Written discharge instructions were provided to the                            patient.                           - Resume previous diet.                           -  Continue present medications.                           - Await pathology results. Docia Chuck. Henrene Pastor, MD 05/13/2017 10:17:32 AM This report has been signed electronically.

## 2017-05-13 NOTE — Patient Instructions (Signed)
YOU HAD AN ENDOSCOPIC PROCEDURE TODAY AT THE [facility]:   Refer to the procedure report that was given to you for any specific questions about what was found during the examination.  If the procedure report does not answer your questions, please call your gastroenterologist to clarify.  If you requested that your care partner not be given the details of your procedure findings, then the procedure report has been included in a sealed envelope for you to review at your convenience later.  YOU SHOULD EXPECT: Some feelings of bloating in the abdomen. Passage of more gas than usual.  Walking can help get rid of the air that was put into your GI tract during the procedure and reduce the bloating. If you had a lower endoscopy (such as a colonoscopy or flexible sigmoidoscopy) you may notice spotting of blood in your stool or on the toilet paper. If you underwent a bowel prep for your procedure, you may not have a normal bowel movement for a few days.  Please Note:  You might notice some irritation and congestion in your nose or some drainage.  This is from the oxygen used during your procedure.  There is no need for concern and it should clear up in a day or so.  SYMPTOMS TO REPORT IMMEDIATELY:   Following lower endoscopy (colonoscopy or flexible sigmoidoscopy):  Excessive amounts of blood in the stool  Significant tenderness or worsening of abdominal pains  Swelling of the abdomen that is new, acute  Fever of 100F or higher   For urgent or emergent issues, a gastroenterologist can be reached at any hour by calling [phone].   DIET:  We do recommend a small meal at first, but then you may proceed to your regular diet.  Drink plenty of fluids but you should avoid alcoholic beverages for 24 hours.  ACTIVITY:  You should plan to take it easy for the rest of today and you should NOT DRIVE or use heavy machinery until tomorrow (because of the sedation medicines used during the test).     FOLLOW UP: Our staff will call the number listed on your records the next business day following your procedure to check on you and address any questions or concerns that you may have regarding the information given to you following your procedure. If we do not reach you, we will leave a message.  However, if you are feeling well and you are not experiencing any problems, there is no need to return our call.  We will assume that you have returned to your regular daily activities without incident.  If any biopsies were taken you will be contacted by phone or by letter within the next 1-3 weeks.  Please call us at [phone] if you have not heard about the biopsies in 3 weeks.    SIGNATURES/CONFIDENTIALITY: You and/or your care partner have signed paperwork which will be entered into your electronic medical record.  These signatures attest to the fact that that the information above on your After Visit Summary has been reviewed and is understood.  Full responsibility of the confidentiality of this discharge information lies with you and/or your care-partner.    Handouts were given to your care partner on polyps, diverticulosis, hemorrhoids, and a high fiber diet with liberal fluid intake. You may resume your current medications today. Await biopsy results. Please call if any questions or concerns.    

## 2017-05-13 NOTE — Progress Notes (Signed)
Called to room to assist during endoscopic procedure.  Patient ID and intended procedure confirmed with present staff. Received instructions for my participation in the procedure from the performing physician.  

## 2017-05-13 NOTE — Progress Notes (Signed)
No problems noted in the recovery room. Maw  No problems or pain in l shoulder while in the recovery room. maw

## 2017-05-13 NOTE — Progress Notes (Signed)
Alert and oriented x3, pleased with MAC, report to RN

## 2017-05-16 ENCOUNTER — Telehealth: Payer: Self-pay | Admitting: Podiatry

## 2017-05-16 ENCOUNTER — Telehealth: Payer: Self-pay | Admitting: *Deleted

## 2017-05-16 ENCOUNTER — Telehealth: Payer: Self-pay

## 2017-05-16 NOTE — Telephone Encounter (Signed)
  Follow up Call-  Call back number 05/13/2017  Post procedure Call Back phone  # [phone]  Permission to leave phone message Yes  Some recent data might be hidden     Patient questions:  Do you have a fever, pain , or abdominal swelling? No. Pain Score  0 *  Have you tolerated food without any problems? Yes.    Have you been able to return to your normal activities? Yes.    Do you have any questions about your discharge instructions: Diet   No. Medications  No. Follow up visit  No.  Do you have questions or concerns about your Care? No.  Actions: * If pain score is 4 or above: No action needed, pain <4.

## 2017-05-16 NOTE — Telephone Encounter (Signed)
Left voicemail for pt to call to schedule a follow up with Dr Paulla Dolly per Dr Jacqualyn Posey.

## 2017-05-16 NOTE — Telephone Encounter (Signed)
Left a message at [phone] for pt for a follow up call from colonoscopy on 05-13-17. maw

## 2017-05-17 ENCOUNTER — Encounter: Payer: Self-pay | Admitting: Internal Medicine

## 2017-05-19 ENCOUNTER — Encounter: Payer: Self-pay | Admitting: Podiatry

## 2017-05-19 DIAGNOSIS — M79676 Pain in unspecified toe(s): Secondary | ICD-10-CM

## 2017-05-19 NOTE — Progress Notes (Signed)
Pt came to office, signed medical records release form, and pick up envelope including requested medical records and cd of x-rays.

## 2017-06-21 ENCOUNTER — Other Ambulatory Visit (HOSPITAL_COMMUNITY): Payer: Self-pay | Admitting: Orthopedic Surgery

## 2017-06-21 ENCOUNTER — Ambulatory Visit
Admission: RE | Admit: 2017-06-21 | Discharge: 2017-06-21 | Disposition: A | Discharge status: Home / Self Care | Payer: BLUE CROSS/BLUE SHIELD | Source: Ambulatory Visit | Attending: Orthopedic Surgery | Admitting: Orthopedic Surgery

## 2017-06-21 DIAGNOSIS — G8929 Other chronic pain: Secondary | ICD-10-CM

## 2017-06-21 DIAGNOSIS — M79671 Pain in right foot: Principal | ICD-10-CM

## 2017-06-23 ENCOUNTER — Ambulatory Visit (HOSPITAL_COMMUNITY): Payer: Medicare Other

## 2017-07-07 ENCOUNTER — Other Ambulatory Visit: Payer: Self-pay | Admitting: Orthopedic Surgery

## 2017-07-20 ENCOUNTER — Ambulatory Visit (INDEPENDENT_AMBULATORY_CARE_PROVIDER_SITE_OTHER): Payer: BLUE CROSS/BLUE SHIELD | Admitting: Orthopaedic Surgery

## 2017-07-21 ENCOUNTER — Encounter (HOSPITAL_BASED_OUTPATIENT_CLINIC_OR_DEPARTMENT_OTHER): Payer: Self-pay | Admitting: *Deleted

## 2017-07-28 ENCOUNTER — Encounter (HOSPITAL_BASED_OUTPATIENT_CLINIC_OR_DEPARTMENT_OTHER): Payer: Self-pay | Admitting: Certified Registered"

## 2017-07-28 ENCOUNTER — Ambulatory Visit (HOSPITAL_BASED_OUTPATIENT_CLINIC_OR_DEPARTMENT_OTHER)
Admission: RE | Admit: 2017-07-28 | Discharge: 2017-07-28 | Disposition: A | Discharge status: Home / Self Care | Payer: BLUE CROSS/BLUE SHIELD | Source: Ambulatory Visit | Attending: Orthopedic Surgery | Admitting: Orthopedic Surgery

## 2017-07-28 ENCOUNTER — Ambulatory Visit (HOSPITAL_BASED_OUTPATIENT_CLINIC_OR_DEPARTMENT_OTHER): Payer: BLUE CROSS/BLUE SHIELD | Admitting: Certified Registered"

## 2017-07-28 ENCOUNTER — Encounter (HOSPITAL_BASED_OUTPATIENT_CLINIC_OR_DEPARTMENT_OTHER)
Admission: RE | Disposition: A | Discharge status: Home / Self Care | Payer: Self-pay | Source: Ambulatory Visit | Attending: Orthopedic Surgery

## 2017-07-28 DIAGNOSIS — F329 Major depressive disorder, single episode, unspecified: Secondary | ICD-10-CM | POA: Insufficient documentation

## 2017-07-28 DIAGNOSIS — M2041 Other hammer toe(s) (acquired), right foot: Secondary | ICD-10-CM | POA: Diagnosis not present

## 2017-07-28 DIAGNOSIS — Z882 Allergy status to sulfonamides status: Secondary | ICD-10-CM | POA: Insufficient documentation

## 2017-07-28 DIAGNOSIS — M2021 Hallux rigidus, right foot: Secondary | ICD-10-CM | POA: Diagnosis not present

## 2017-07-28 DIAGNOSIS — Z79899 Other long term (current) drug therapy: Secondary | ICD-10-CM | POA: Insufficient documentation

## 2017-07-28 DIAGNOSIS — F17210 Nicotine dependence, cigarettes, uncomplicated: Secondary | ICD-10-CM | POA: Diagnosis not present

## 2017-07-28 DIAGNOSIS — G47419 Narcolepsy without cataplexy: Secondary | ICD-10-CM | POA: Diagnosis not present

## 2017-07-28 DIAGNOSIS — R51 Headache: Secondary | ICD-10-CM | POA: Insufficient documentation

## 2017-07-28 DIAGNOSIS — M797 Fibromyalgia: Secondary | ICD-10-CM | POA: Insufficient documentation

## 2017-07-28 DIAGNOSIS — F419 Anxiety disorder, unspecified: Secondary | ICD-10-CM | POA: Insufficient documentation

## 2017-07-28 DIAGNOSIS — Z853 Personal history of malignant neoplasm of breast: Secondary | ICD-10-CM | POA: Diagnosis not present

## 2017-07-28 DIAGNOSIS — E039 Hypothyroidism, unspecified: Secondary | ICD-10-CM | POA: Insufficient documentation

## 2017-07-28 DIAGNOSIS — G4730 Sleep apnea, unspecified: Secondary | ICD-10-CM | POA: Diagnosis not present

## 2017-07-28 DIAGNOSIS — M1990 Unspecified osteoarthritis, unspecified site: Secondary | ICD-10-CM | POA: Insufficient documentation

## 2017-07-28 HISTORY — PX: HARDWARE REMOVAL: SHX979

## 2017-07-28 HISTORY — PX: ARTHRODESIS METATARSALPHALANGEAL JOINT (MTPJ): SHX6566

## 2017-07-28 HISTORY — PX: HAMMER TOE SURGERY: SHX385

## 2017-07-28 SURGERY — REMOVAL, HARDWARE
Anesthesia: General | Site: Foot | Laterality: Right

## 2017-07-28 MED ORDER — PROMETHAZINE HCL 25 MG/ML IJ SOLN: 6.2500 mg | INTRAMUSCULAR | Status: DC | PRN

## 2017-07-28 MED ORDER — OXYCODONE HCL 5 MG PO TABS: 5.0000 mg | ORAL_TABLET | Freq: Once | ORAL | Status: DC | PRN

## 2017-07-28 MED ORDER — LIDOCAINE HCL (CARDIAC) 20 MG/ML IV SOLN
INTRAVENOUS | Status: DC | PRN
  Administered 2017-07-28: 30 mg via INTRAVENOUS

## 2017-07-28 MED ORDER — 0.9 % SODIUM CHLORIDE (POUR BTL) OPTIME
TOPICAL | Status: DC | PRN
  Administered 2017-07-28: 200 mL

## 2017-07-28 MED ORDER — MIDAZOLAM HCL 2 MG/2ML IJ SOLN
INTRAMUSCULAR | Status: AC
  Filled 2017-07-28: qty 2

## 2017-07-28 MED ORDER — SENNA 8.6 MG PO TABS: 2.0000 | ORAL_TABLET | Freq: Two times a day (BID) | ORAL | 0 refills | Status: DC

## 2017-07-28 MED ORDER — SODIUM CHLORIDE 0.9 % IV SOLN: INTRAVENOUS | Status: DC

## 2017-07-28 MED ORDER — OXYCODONE HCL 5 MG/5ML PO SOLN: 5.0000 mg | Freq: Once | ORAL | Status: DC | PRN

## 2017-07-28 MED ORDER — CEFAZOLIN SODIUM-DEXTROSE 2-4 GM/100ML-% IV SOLN
2.0000 g | INTRAVENOUS | Status: AC
  Administered 2017-07-28: 2 g via INTRAVENOUS

## 2017-07-28 MED ORDER — SCOPOLAMINE 1 MG/3DAYS TD PT72
MEDICATED_PATCH | TRANSDERMAL | Status: AC
  Filled 2017-07-28: qty 1

## 2017-07-28 MED ORDER — CEFAZOLIN SODIUM-DEXTROSE 2-4 GM/100ML-% IV SOLN
INTRAVENOUS | Status: AC
  Filled 2017-07-28: qty 100

## 2017-07-28 MED ORDER — SCOPOLAMINE 1 MG/3DAYS TD PT72
1.0000 | MEDICATED_PATCH | Freq: Once | TRANSDERMAL | Status: DC | PRN
  Administered 2017-07-28: 1.5 mg via TRANSDERMAL

## 2017-07-28 MED ORDER — BUPIVACAINE-EPINEPHRINE (PF) 0.5% -1:200000 IJ SOLN
INTRAMUSCULAR | Status: DC | PRN
  Administered 2017-07-28: 30 mL via PERINEURAL

## 2017-07-28 MED ORDER — FENTANYL CITRATE (PF) 100 MCG/2ML IJ SOLN
INTRAMUSCULAR | Status: AC
Start: 2017-07-28 — End: ?
  Filled 2017-07-28: qty 2

## 2017-07-28 MED ORDER — LACTATED RINGERS IV SOLN
INTRAVENOUS | Status: DC
  Administered 2017-07-28 (×2): via INTRAVENOUS

## 2017-07-28 MED ORDER — FENTANYL CITRATE (PF) 100 MCG/2ML IJ SOLN
50.0000 ug | INTRAMUSCULAR | Status: DC | PRN
  Administered 2017-07-28: 100 ug via INTRAVENOUS

## 2017-07-28 MED ORDER — MIDAZOLAM HCL 2 MG/2ML IJ SOLN
1.0000 mg | INTRAMUSCULAR | Status: DC | PRN
  Administered 2017-07-28: 2 mg via INTRAVENOUS

## 2017-07-28 MED ORDER — HYDROMORPHONE HCL 1 MG/ML IJ SOLN: 0.2500 mg | INTRAMUSCULAR | Status: DC | PRN

## 2017-07-28 MED ORDER — ASPIRIN EC 81 MG PO TBEC: 81.0000 mg | DELAYED_RELEASE_TABLET | Freq: Two times a day (BID) | ORAL | 0 refills | Status: DC

## 2017-07-28 MED ORDER — BUPIVACAINE-EPINEPHRINE (PF) 0.5% -1:200000 IJ SOLN
INTRAMUSCULAR | Status: AC
  Filled 2017-07-28: qty 30

## 2017-07-28 MED ORDER — PROPOFOL 10 MG/ML IV BOLUS
INTRAVENOUS | Status: DC | PRN
  Administered 2017-07-28: 120 mg via INTRAVENOUS

## 2017-07-28 MED ORDER — PHENYLEPHRINE HCL 10 MG/ML IJ SOLN
INTRAMUSCULAR | Status: DC | PRN
  Administered 2017-07-28 (×4): 40 ug via INTRAVENOUS

## 2017-07-28 MED ORDER — CHLORHEXIDINE GLUCONATE 4 % EX LIQD: 60.0000 mL | Freq: Once | CUTANEOUS | Status: DC

## 2017-07-28 MED ORDER — ONDANSETRON HCL 4 MG/2ML IJ SOLN
INTRAMUSCULAR | Status: DC | PRN
  Administered 2017-07-28: 4 mg via INTRAVENOUS

## 2017-07-28 MED ORDER — EPHEDRINE SULFATE 50 MG/ML IJ SOLN
INTRAMUSCULAR | Status: DC | PRN
  Administered 2017-07-28 (×3): 10 mg via INTRAVENOUS

## 2017-07-28 MED ORDER — DEXAMETHASONE SODIUM PHOSPHATE 10 MG/ML IJ SOLN
INTRAMUSCULAR | Status: DC | PRN
  Administered 2017-07-28: 10 mg via INTRAVENOUS

## 2017-07-28 MED ORDER — MEPERIDINE HCL 25 MG/ML IJ SOLN: 6.2500 mg | INTRAMUSCULAR | Status: DC | PRN

## 2017-07-28 MED ORDER — DOCUSATE SODIUM 100 MG PO CAPS: 100.0000 mg | ORAL_CAPSULE | Freq: Two times a day (BID) | ORAL | 0 refills | Status: DC

## 2017-07-28 SURGICAL SUPPLY — 90 items
APL SKNCLS STERI-STRIP NONHPOA (GAUZE/BANDAGES/DRESSINGS)
BANDAGE ACE 4X5 VEL STRL LF (GAUZE/BANDAGES/DRESSINGS) IMPLANT
BANDAGE ESMARK 6X9 LF (GAUZE/BANDAGES/DRESSINGS) IMPLANT
BENZOIN TINCTURE PRP APPL 2/3 (GAUZE/BANDAGES/DRESSINGS) IMPLANT
BIT DRILL 1.8 CANN MAX VPC (BIT) ×2 IMPLANT
BIT DRILL 2 FAST STEP (BIT) ×2 IMPLANT
BIT DRILL 2.4 AO COUPLING CANN (BIT) ×2 IMPLANT
BIT DRILL 2.5X2.75 QC CALB (BIT) ×2 IMPLANT
BLADE AVERAGE 25MMX9MM (BLADE)
BLADE AVERAGE 25X9 (BLADE) IMPLANT
BLADE MICRO SAGITTAL (BLADE) IMPLANT
BLADE OSC/SAG .038X5.5 CUT EDG (BLADE) ×2 IMPLANT
BLADE SURG 15 STRL LF DISP TIS (BLADE) ×2 IMPLANT
BLADE SURG 15 STRL SS (BLADE) ×9
BNDG CMPR 9X4 STRL LF SNTH (GAUZE/BANDAGES/DRESSINGS)
BNDG CMPR 9X6 STRL LF SNTH (GAUZE/BANDAGES/DRESSINGS)
BNDG COHESIVE 4X5 TAN STRL (GAUZE/BANDAGES/DRESSINGS) ×3 IMPLANT
BNDG COHESIVE 6X5 TAN STRL LF (GAUZE/BANDAGES/DRESSINGS) ×3 IMPLANT
BNDG ESMARK 4X9 LF (GAUZE/BANDAGES/DRESSINGS) IMPLANT
BNDG ESMARK 6X9 LF (GAUZE/BANDAGES/DRESSINGS)
CHLORAPREP W/TINT 26ML (MISCELLANEOUS) ×3 IMPLANT
CLOSURE WOUND 1/2 X4 (GAUZE/BANDAGES/DRESSINGS)
COVER BACK TABLE 60X90IN (DRAPES) ×3 IMPLANT
CUFF TOURNIQUET SINGLE 34IN LL (TOURNIQUET CUFF) ×2 IMPLANT
DECANTER SPIKE VIAL GLASS SM (MISCELLANEOUS) IMPLANT
DRAPE EXTREMITY T 121X128X90 (DRAPE) ×3 IMPLANT
DRAPE OEC MINIVIEW 54X84 (DRAPES) ×3 IMPLANT
DRAPE SURG 17X23 STRL (DRAPES) IMPLANT
DRAPE U-SHAPE 47X51 STRL (DRAPES) ×3 IMPLANT
DRSG MEPITEL 4X7.2 (GAUZE/BANDAGES/DRESSINGS) ×3 IMPLANT
DRSG PAD ABDOMINAL 8X10 ST (GAUZE/BANDAGES/DRESSINGS) ×6 IMPLANT
ELECT REM PT RETURN 9FT ADLT (ELECTROSURGICAL) ×3
ELECTRODE REM PT RTRN 9FT ADLT (ELECTROSURGICAL) ×1 IMPLANT
GAUZE SPONGE 4X4 12PLY STRL (GAUZE/BANDAGES/DRESSINGS) ×3 IMPLANT
GLOVE BIO SURGEON STRL SZ8 (GLOVE) ×3 IMPLANT
GLOVE BIOGEL PI IND STRL 7.0 (GLOVE) IMPLANT
GLOVE BIOGEL PI IND STRL 8 (GLOVE) ×2 IMPLANT
GLOVE BIOGEL PI INDICATOR 7.0 (GLOVE) ×4
GLOVE BIOGEL PI INDICATOR 8 (GLOVE) ×4
GLOVE ECLIPSE 6.5 STRL STRAW (GLOVE) ×2 IMPLANT
GLOVE ECLIPSE 8.0 STRL XLNG CF (GLOVE) ×3 IMPLANT
GOWN STRL REUS W/ TWL LRG LVL3 (GOWN DISPOSABLE) ×1 IMPLANT
GOWN STRL REUS W/ TWL XL LVL3 (GOWN DISPOSABLE) ×2 IMPLANT
GOWN STRL REUS W/TWL LRG LVL3 (GOWN DISPOSABLE) ×3
GOWN STRL REUS W/TWL XL LVL3 (GOWN DISPOSABLE) ×6
K-WIRE ACE 1.6X6 (WIRE) ×6
K-WIRE COCR 0.9X95 (WIRE) ×3
KWIRE ACE 1.6X6 (WIRE) IMPLANT
KWIRE COCR 0.9X95 (WIRE) IMPLANT
NEEDLE HYPO 22GX1.5 SAFETY (NEEDLE) IMPLANT
PACK BASIN DAY SURGERY FS (CUSTOM PROCEDURE TRAY) ×3 IMPLANT
PAD CAST 4YDX4 CTTN HI CHSV (CAST SUPPLIES) ×1 IMPLANT
PADDING CAST ABS 4INX4YD NS (CAST SUPPLIES)
PADDING CAST ABS COTTON 4X4 ST (CAST SUPPLIES) IMPLANT
PADDING CAST COTTON 4X4 STRL (CAST SUPPLIES) ×3
PADDING CAST COTTON 6X4 STRL (CAST SUPPLIES) ×3 IMPLANT
PENCIL BUTTON HOLSTER BLD 10FT (ELECTRODE) ×3 IMPLANT
PLATE LOCK 1ST RT MTP (Plate) ×2 IMPLANT
SANITIZER HAND PURELL 535ML FO (MISCELLANEOUS) ×3 IMPLANT
SCREW CANN 4.0X38MM (Screw) ×3 IMPLANT
SCREW CANN PT 4X38 NS (Screw) IMPLANT
SCREW PEG 2.5X18 NONLOCK (Screw) ×2 IMPLANT
SCREW PEG 2.5X20 NONLOCK (Screw) ×2 IMPLANT
SCREW PEG 2.5X22 NONLOCK (Screw) ×2 IMPLANT
SCREW PEG LOCK 2.5X10 (Peg) ×2 IMPLANT
SCREW PEG LOCK 2.5X14 (Peg) ×2 IMPLANT
SCREW PEG LOCK 2.5X20 (Peg) ×6 IMPLANT
SCREW VPC 2.5X14MM (Screw) ×2 IMPLANT
SHEET MEDIUM DRAPE 40X70 STRL (DRAPES) ×5 IMPLANT
SLEEVE SCD COMPRESS KNEE MED (MISCELLANEOUS) ×3 IMPLANT
SPLINT FAST PLASTER 5X30 (CAST SUPPLIES) ×40
SPLINT PLASTER CAST FAST 5X30 (CAST SUPPLIES) ×20 IMPLANT
SPONGE LAP 18X18 X RAY DECT (DISPOSABLE) ×3 IMPLANT
SPONGE SURGIFOAM ABS GEL 12-7 (HEMOSTASIS) IMPLANT
STOCKINETTE 6  STRL (DRAPES) ×2
STOCKINETTE 6 STRL (DRAPES) ×1 IMPLANT
STRIP CLOSURE SKIN 1/2X4 (GAUZE/BANDAGES/DRESSINGS) IMPLANT
SUCTION FRAZIER HANDLE 10FR (MISCELLANEOUS) ×2
SUCTION TUBE FRAZIER 10FR DISP (MISCELLANEOUS) ×1 IMPLANT
SUT ETHILON 3 0 PS 1 (SUTURE) ×3 IMPLANT
SUT MNCRL AB 3-0 PS2 18 (SUTURE) ×3 IMPLANT
SUT VIC AB 0 SH 27 (SUTURE) IMPLANT
SUT VIC AB 2-0 SH 27 (SUTURE) ×3
SUT VIC AB 2-0 SH 27XBRD (SUTURE) IMPLANT
SYR BULB 3OZ (MISCELLANEOUS) ×3 IMPLANT
SYR CONTROL 10ML LL (SYRINGE) IMPLANT
TOWEL OR 17X24 6PK STRL BLUE (TOWEL DISPOSABLE) ×6 IMPLANT
TUBE CONNECTING 20'X1/4 (TUBING) ×1
TUBE CONNECTING 20X1/4 (TUBING) ×2 IMPLANT
UNDERPAD 30X30 (UNDERPADS AND DIAPERS) ×3 IMPLANT

## 2017-07-28 NOTE — Anesthesia Preprocedure Evaluation (Signed)
Anesthesia Evaluation  Patient identified by MRN, date of birth, ID band Patient unresponsive    Reviewed: Allergy & Precautions, H&P , NPO status , Patient's Chart, lab work & pertinent test results, reviewed documented beta blocker date and time   History of Anesthesia Complications (+) PONV and history of anesthetic complications  Airway Mallampati: II  TM Distance: >3 FB Neck ROM: Full    Dental no notable dental hx. (+) Teeth Intact, Dental Advisory Given   Pulmonary sleep apnea and Continuous Positive Airway Pressure Ventilation , Current Smoker,    Pulmonary exam normal breath sounds clear to auscultation       Cardiovascular Exercise Tolerance: Good negative cardio ROS  + Valvular Problems/Murmurs  Rhythm:Regular Rate:Normal     Neuro/Psych  Headaches, PSYCHIATRIC DISORDERS Anxiety Depression negative psych ROS   GI/Hepatic negative GI ROS, Neg liver ROS,   Endo/Other  negative endocrine ROSHypothyroidism   Renal/GU negative Renal ROS     Musculoskeletal  (+) Arthritis , Osteoarthritis,  Fibromyalgia -  Abdominal   Peds  Hematology negative hematology ROS (+)   Anesthesia Other Findings   Reproductive/Obstetrics negative OB ROS                             Anesthesia Physical  Anesthesia Plan  ASA: III  Anesthesia Plan: General   Post-op Pain Management:  Regional for Post-op pain   Induction: Intravenous  PONV Risk Score and Plan: 3 and Ondansetron, Dexamethasone and Midazolam  Airway Management Planned: Oral ETT and LMA  Additional Equipment:   Intra-op Plan:   Post-operative Plan: Extubation in OR  Informed Consent: I have reviewed the patients History and Physical, chart, labs and discussed the procedure including the risks, benefits and alternatives for the proposed anesthesia with the patient or authorized representative who has indicated his/her understanding  and acceptance.   Dental advisory given  Plan Discussed with: CRNA  Anesthesia Plan Comments:         Anesthesia Quick Evaluation

## 2017-07-28 NOTE — Anesthesia Postprocedure Evaluation (Signed)
Anesthesia Post Note  Patient: [name]  Procedure(s) Performed: Procedure(s) (LRB): REMOVAL OF DEEP IMPLANTS (Right) ARTHRODESIS  RIGHT HALLUX METATARSALPHALANGEAL JOINT (MTPJ) (Right) THIRD HAMMERTOE CORRECTION (Right)     Patient location during evaluation: PACU Anesthesia Type: General Level of consciousness: sedated and patient cooperative Pain management: pain level controlled Vital Signs Assessment: post-procedure vital signs reviewed and stable Respiratory status: spontaneous breathing Cardiovascular status: stable Anesthetic complications: no    Last Vitals:  Vitals:   07/28/17 1340 07/28/17 1345  BP:  116/65  Pulse:  76  Resp:  16  Temp:  36.5 C  SpO2: 95% 97%    Last Pain:  Vitals:   07/28/17 1345  TempSrc:   PainSc: 0-No pain                 Nolon Nations

## 2017-07-28 NOTE — Discharge Instructions (Addendum)
Sarah Simmer, MD [place]  Please read the following information regarding your care after surgery.  Medications  You only need a prescription for the narcotic pain medicine (ex. oxycodone, Percocet, Norco).  All of the other medicines listed below are available over the counter. X Meloxicam that you have at home for the first 3 days after surgery. X acetominophen (Tylenol) 650 mg every 4-6 hours as you need for minor to moderate pain X oxycodone as prescribed for severe pain  Narcotic pain medicine (ex. oxycodone, Percocet, Vicodin) will cause constipation.  To prevent this problem, take the following medicines while you are taking any pain medicine. X docusate sodium (Colace) 100 mg twice a day X senna (Senokot) 2 tablets twice a day  X To help prevent blood clots, take a baby aspirin (81 mg) twice a day for two weeks after surgery.  You should also get up every hour while you are awake to move around.    Weight Bearing X Do not bear any weight on the operated leg or foot.  Cast / Splint / Dressing X Keep your splint, cast or dressing clean and dry.  Dont put anything (coat hanger, pencil, etc) down inside of it.  If it gets damp, use a hair dryer on the cool setting to dry it.  If it gets soaked, call the office to schedule an appointment for a cast change.  After your dressing, cast or splint is removed; you may shower, but do not soak or scrub the wound.  Allow the water to run over it, and then gently pat it dry.  Swelling It is normal for you to have swelling where you had surgery.  To reduce swelling and pain, keep your toes above your nose for at least 3 days after surgery.  It may be necessary to keep your foot or leg elevated for several weeks.  If it hurts, it should be elevated.  Follow Up Call my office at [phone] when you are discharged from the hospital or surgery center to schedule an appointment to be seen two weeks after surgery.  Call my office at  [phone] if you develop a fever >101.5 F, nausea, vomiting, bleeding from the surgical site or severe pain.    Regional Anesthesia Blocks  1. Numbness or the inability to move the "blocked" extremity may last from 3-48 hours after placement. The length of time depends on the medication injected and your individual response to the medication. If the numbness is not going away after 48 hours, call your surgeon.  2. The extremity that is blocked will need to be protected until the numbness is gone and the  Strength has returned. Because you cannot feel it, you will need to take extra care to avoid injury. Because it may be weak, you may have difficulty moving it or using it. You may not know what position it is in without looking at it while the block is in effect.  3. For blocks in the legs and feet, returning to weight bearing and walking needs to be done carefully. You will need to wait until the numbness is entirely gone and the strength has returned. You should be able to move your leg and foot normally before you try and bear weight or walk. You will need someone to be with you when you first try to ensure you do not fall and possibly risk injury.  4. Bruising and tenderness at the needle site are common side effects and will resolve  in a few days.  5. Persistent numbness or new problems with movement should be communicated to the surgeon or the [facility] [place] [phone] [place] [phone]).     Post Anesthesia Home Care Instructions  Activity: Get plenty of rest for the remainder of the day. A responsible individual must stay with you for 24 hours following the procedure.  For the next 24 hours, DO NOT: -Drive a car -Paediatric nurse -Drink alcoholic beverages -Take any medication unless instructed by your physician -Make any legal decisions or sign important papers.  Meals: Start with liquid foods such as gelatin or soup. Progress to regular  foods as tolerated. Avoid greasy, spicy, heavy foods. If nausea and/or vomiting occur, drink only clear liquids until the nausea and/or vomiting subsides. Call your physician if vomiting continues.  Special Instructions/Symptoms: Your throat may feel dry or sore from the anesthesia or the breathing tube placed in your throat during surgery. If this causes discomfort, gargle with warm salt water. The discomfort should disappear within 24 hours.  If you had a scopolamine patch placed behind your ear for the management of post- operative nausea and/or vomiting:  1. The medication in the patch is effective for 72 hours, after which it should be removed.  Wrap patch in a tissue and discard in the trash. Wash hands thoroughly with soap and water. 2. You may remove the patch earlier than 72 hours if you experience unpleasant side effects which may include dry mouth, dizziness or visual disturbances. 3. Avoid touching the patch. Wash your hands with soap and water after contact with the patch.

## 2017-07-28 NOTE — Anesthesia Procedure Notes (Signed)
Anesthesia Regional Block: Popliteal block   Pre-Anesthetic Checklist: ,, timeout performed, Correct Patient, Correct Site, Correct Laterality, Correct Procedure, Correct Position, site marked, Risks and benefits discussed,  Surgical consent,  Pre-op evaluation,  At surgeon's request and post-op pain management  Laterality: Right  Prep: chloraprep       Needles:  Injection technique: Single-shot  Needle Type: Stimiplex     Needle Length: 10cm  Needle Gauge: 21     Additional Needles:   Procedures:,,,, ultrasound used (permanent image in chart),,,,  Motor weakness within 5 minutes.   Nerve Stimulator or Paresthesia:  Response: Plantar flexion/toe flexion, 0.5 mA,   Additional Responses:   Narrative:  Start time: 07/28/2017 7:25 AM End time: 07/28/2017 7:30 AM Injection made incrementally with aspirations every 5 mL.  Performed by: Personally  Anesthesiologist: Nolon Nations  Additional Notes: Nerve located and needle positioned with direct ultrasound guidance. Good perineural spread. Patient tolerated well.

## 2017-07-28 NOTE — H&P (Signed)
[name] is an 56 y.o. female.   Chief Complaint: right foot pain HPI:  56 y/o female with chronic right foot pain and swelling after previous bunion correction by another doctor.  She had a chevron bunion correction c/b fracture of the metatarsal head and nonunion.  She also has a 3rd toe deformity that is chronically painful.  She has failed non op treatment and presents today for surgical correction of the hallux and 3rd toe deformities.  She reports that she has quit smoking.  Past Medical History:  Diagnosis Date  . Anxiety   . Arthritis   . Breast cancer ([place])    rt breast  and left breast breast removed  . Breast reconstruction deformity   . Complication of anesthesia    shaking-after mastectomies-resp and hr dropped  . Depression, major    suicidal thoughts -implants, mental health, 05/11  . Fibromyalgia   . Headache(784.0)   . Hypersomnia    sleep attacks  . Hypothyroidism   . Migraine headache   . Narcolepsy   . Neoplasm    malignant breast, history of status post bilateral  . Post-operative nausea and vomiting   . Sleep apnea    CPAP  . Sleep apnea with use of continuous positive airway pressure (CPAP)    AHI 20 in HST, titration to 6 cm water - residual AHi 6 . remaining hypersomnic.     Past Surgical History:  Procedure Laterality Date  . Breast reconstructioin  7/09   flaps-bilat mastectomies  . BTL    . CARPAL TUNNEL RELEASE  2011   both hands  . CARPOMETACARPEL SUSPENSION PLASTY Right 08/29/2014   Procedure: REMOVAL DERMASPAN SUSPENSIONPLASTY RIGHT THUMB ABDUCTOR POLLIS TRANSFER;  Surgeon: Daryll Brod, MD;  Location: [facility];  Service: Orthopedics;  Laterality: Right;  . COLONOSCOPY W/ POLYPECTOMY  4/03   tubulovillous adenoma  . FINGER ARTHROSCOPY WITH CARPOMETACARPEL [facility]) ARTHROPLASTY Right 06/20/2014   Procedure: ARTHROSCOPY RIGHT THUMB CARPOMETACARPEL GRAFT JACKET INTERPOSITION PARTIAL TRAPEZIECTOMY POSSIBLE OPEN;  Surgeon: Daryll Brod, MD;  Location: [place];  Service: Orthopedics;  Laterality: Right;  . GANGLION CYST EXCISION     both [phone]  . hysterestomy  2005  . MASTECTOMY Bilateral 2008  . post- a -cath  2008  . TOTAL SHOULDER ARTHROPLASTY Left 01/13/2017   Procedure: LEFT TOTAL SHOULDER ARTHROPLASTY;  Surgeon: Tania Ade, MD;  Location: [place];  Service: Orthopedics;  Laterality: Left;  LEFT TOTAL SHOULDER ARTHROPLASTY  . TRIGGER FINGER RELEASE Right 08/29/2014   Procedure: RELEASE TRIGGER FINGER/A-1 PULLEY;  Surgeon: Daryll Brod, MD;  Location: [place];  Service: Orthopedics;  Laterality: Right;  . wisdom teeth out      Family History  Problem Relation Age of Onset  . Breast cancer Mother   . Breast cancer Maternal Aunt        breast  . Colon cancer Maternal Grandmother        age unknown, ? 22's  . Breast cancer Maternal Grandmother   . Colon cancer Cousin    Social History:  reports that she has been smoking Cigarettes.  She has been smoking about 0.25 packs per day. She has never used smokeless tobacco. She reports that she drinks alcohol. She reports that she does not use drugs.  Allergies:  Allergies  Allergen Reactions  . Sulfonamide Derivatives Hives and Other (See Comments)    FEVER    Medications Prior to Admission  Medication Sig Dispense Refill  .  ARIPiprazole (ABILIFY) 20 MG tablet Take 20 mg by mouth daily.    . B Complex-C (B-COMPLEX WITH VITAMIN C) tablet Take 1 tablet by mouth daily.    . benztropine (COGENTIN) 1 MG tablet Take 1 mg by mouth at bedtime.     Marland Kitchen escitalopram (LEXAPRO) 20 MG tablet Take 20 mg by mouth daily.    Marland Kitchen LYRICA 100 MG capsule Take 100 mg by mouth 2 (two) times daily.     . Melatonin 5 MG CAPS Take 5 mg by mouth at bedtime.    . meloxicam (MOBIC) 15 MG tablet     . methylphenidate (RITALIN LA) 30 MG 24 hr capsule Take 30 mg by mouth daily.     . Multiple Vitamins-Minerals (MULTIVITAMIN WITH MINERALS) tablet Take 1  tablet by mouth daily.    . Naloxegol Oxalate (MOVANTIK PO) Take by mouth daily.    Gean Birchwood ER 250 MG TB12 Take 250 mg by mouth 2 (two) times daily.     Marland Kitchen oxyCODONE-acetaminophen (PERCOCET/ROXICET) 5-325 MG per tablet Take 1 tablet by mouth every 4 (four) hours as needed for severe pain.     Marland Kitchen propranolol (INDERAL) 20 MG tablet Take 20 mg by mouth 3 (three) times daily as needed (for anxiety.).    Marland Kitchen propranolol ER (INDERAL LA) 160 MG SR capsule Take 160 mg by mouth daily.      No results found for this or any previous visit (from the past 48 hour(s)). No results found.  ROS  No recent f/c/n/v/wt loss  Blood pressure 102/70, pulse 68, temperature 97.7 F (36.5 C), temperature source Oral, resp. rate 20, height 5\' 4"  (1.626 m), weight 78.5 kg (173 lb), SpO2 96 %. Physical Exam  wn wd woman in nad.  A and ox 4.  Mood and affect normal.  EOMI.  resp unlabored.  R foot with healthy skin.  Swelling is evident over the dorsum of the foot.  5/5 strength in PF and DF fo the toes and ankle.  sens to LT intact at the forefoot.  No lymphadenopathy.  TTP over the hallux MPJ dorsally.  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  R 3rd toe hammertoe correction.  The risks and benefits of the alternative treatment options have been discussed in detail.  The patient wishes to proceed with surgery and specifically understands risks of bleeding, infection, nerve damage, blood clots, need for additional surgery, amputation and death.   Wylene Simmer, MD 08-24-17, 7:20 AM

## 2017-07-28 NOTE — Progress Notes (Signed)
Assisted Dr. Germeroth with right, ultrasound guided, popliteal block. Side rails up, monitors on throughout procedure. See vital signs in flow sheet. Tolerated Procedure well. 

## 2017-07-28 NOTE — Op Note (Signed)
07/28/2017  9:38 AM  PATIENT:  [name]  56 y.o. female  PRE-OPERATIVE DIAGNOSIS: 1.  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  Right hallux rigidus      3.  Right 3rd hammertoe       POST-OPERATIVE DIAGNOSIS:  Same  Procedure(s): 1.  REMOVAL OF DEEP IMPLANTS right 1st MT 2.  ARTHRODESIS  RIGHT HALLUX METATARSALPHALANGEAL JOINT (MTPJ) 3.  Right THIRD HAMMERTOE CORRECTION (separate incision) 4.  Right foot AP, oblique and lateral xrays  SURGEON:  Wylene Simmer, MD  ASSISTANT: Mechele Claude, PA-C  ANESTHESIA:   General, regional  EBL:  minimal   TOURNIQUET:   Total Tourniquet Time Documented: Thigh (Right) - 69 minutes Total: Thigh (Right) - 69 minutes  COMPLICATIONS:  None apparent  DISPOSITION:  Extubated, awake and stable to recovery.  INDICATION FOR PROCEDURE:  The patient is a 56 year old female without significant past medical history. She has chronic pain and swelling of her right foot after a bunion correction done over a year ago. Radiographs show a first metatarsal head nonunion with retained K wires. She has developed degenerative changes at the hallux MP joint and now has symptoms of hallux rigidus. She also has a third hammertoe deformity with severe degenerative changes at the distal interphalangeal joint. She presents today for operative treatment.  The risks and benefits of the alternative treatment options have been discussed in detail.  The patient wishes to proceed with surgery and specifically understands risks of bleeding, infection, nerve damage, blood clots, need for additional surgery, amputation and death.  PROCEDURE IN DETAIL:  After pre operative consent was obtained, and the correct operative site was identified, the patient was brought to the operating room and placed supine on the OR table.  Anesthesia was administered.  Pre-operative antibiotics were administered.  A surgical timeout was taken. The right lower extremity was then  prepped and draped in standard sterile fashion with a tourniquet around the thigh. The extremity was exsanguinated and the tourniquet was inflated to 250 mmHg. A longitudinal incision was made over the hallux MP joint at the site of her previous surgery. Dissection was carried down through the skin and subcutaneous tissues. Hallux MP joint was exposed. The K wires were identified at the metatarsal neck dorsally. Both were removed without difficulty. The hallux MP joint was then opened and debrided of all scar tissue. The collateral ligaments were released and a K wire was inserted in the central portion of the head of the metatarsal. A 20 mm concave reamer was used to remove the remaining articular cartilage and subchondral bone.  The base of the proximal phalanx was exposed and a K wire inserted centrally. A 20 mm convex reamer was then used to remove the remaining articular cartilage and subchondral bone. Wound was irrigated copiously. Both sides of the joint were perforated and multiple locations leaving the resultant bone graft in place. The joint was reduced and provisionally pinned. AP and lateral radiographs confirmed appropriate alignment of the joint. A 4 mm Biomet partially-threaded cannulated screw was then inserted across the joint through a stab incision medially. A compressed the joint appropriately and was noted to have excellent purchase.  A small Alps plate was selected from the hallux MP joint fusion set. It was contoured to fit the dorsal aspect the joint. It was secured with locking and nonlocking screws on both sides of the joint. AP and lateral radiographs confirmed appropriate alignment of all hardware  and appropriate reduction of the joint.  Attention was then turned to the third toe. A transverse incision was made over the DIP joint. Dissection was carried down through skin and subtenon's tissue to the level of the joint. It was noted to have severe arthritic changes. The oscillating  saw was used to remove both sides of the joint. The wound was irrigated and the joint was reduced. The DIP joint was then fixed with a 2.5 mm Biomet FRS screw. AP oblique and lateral ray grafts confirmed appropriate alignment of all hardware and appropriate reduction of the hallux MP joint and third toe DIP joint. The wounds were then irrigated and closed with Vicryl and nylon. Sterile dressings were applied followed by a well-padded short leg splint. Tourniquet was released after application of the dressings at 69 minutes. Patient was awakened from anesthesia and transported to the recovery room in stable condition.   FOLLOW UP PLAN: Nonweightbearing on the right lower extremity. Follow-up with me in 2 weeks for suture removal. Aspirin 81 mg by mouth twice a day for DVT prophylaxis. Plan to convert to a Cam Walker boot at her next visit and begin weightbearing as tolerated.   RADIOGRAPHS: AP, oblique and lateral radiographs of the right foot are obtained intraoperatively. These show interval reduction and arthrodesis of the hallux MP joint as well as hardware removal from the metatarsal head. Also noted is interval arthrodesis of the third toe DIP joint. No other acute injuries are noted.    Mechele Claude PA-C was present and scrubbed for the duration of the operative case. His assistance assistance was essential in positioning the patient, prepping and draping, gaining maintaining exposure, performing the operation, closing and dressing the wounds and applying the splint.

## 2017-07-28 NOTE — Anesthesia Procedure Notes (Signed)
Procedure Name: LMA Insertion Date/Time: 07/28/2017 7:44 AM Performed by: Felecia Stanfill D Pre-anesthesia Checklist: Patient identified, Emergency Drugs available, Suction available and Patient being monitored Patient Re-evaluated:Patient Re-evaluated prior to induction Oxygen Delivery Method: Circle system utilized Preoxygenation: Pre-oxygenation with 100% oxygen Induction Type: IV induction Ventilation: Mask ventilation without difficulty LMA: LMA inserted LMA Size: 3.0 Number of attempts: 1 Airway Equipment and Method: Bite block Placement Confirmation: positive ETCO2 Tube secured with: Tape Dental Injury: Teeth and Oropharynx as per pre-operative assessment

## 2017-07-28 NOTE — Transfer of Care (Signed)
Immediate Anesthesia Transfer of Care Note  Patient: [name]  Procedure(s) Performed: Procedure(s): REMOVAL OF DEEP IMPLANTS (Right) ARTHRODESIS  RIGHT HALLUX METATARSALPHALANGEAL JOINT (MTPJ) (Right) THIRD HAMMERTOE CORRECTION (Right)  Patient Location: PACU  Anesthesia Type:GA combined with regional for post-op pain  Level of Consciousness: awake and patient cooperative  Airway & Oxygen Therapy: Patient Spontanous Breathing and Patient connected to face mask oxygen  Post-op Assessment: Report given to RN and Post -op Vital signs reviewed and stable  Post vital signs: Reviewed and stable  Last Vitals:  Vitals:   07/28/17 0732 07/28/17 0921  BP: 101/65   Pulse: 67 76  Resp: 16 14  Temp:    SpO2: 98% 99%    Last Pain:  Vitals:   07/28/17 0641  TempSrc: Oral  PainSc: 5          Complications: No apparent anesthesia complications

## 2017-07-29 ENCOUNTER — Encounter (HOSPITAL_BASED_OUTPATIENT_CLINIC_OR_DEPARTMENT_OTHER): Payer: Self-pay | Admitting: Orthopedic Surgery

## 2017-08-19 NOTE — Telephone Encounter (Signed)
Entered in error

## 2017-09-14 DIAGNOSIS — Z4789 Encounter for other orthopedic aftercare: Secondary | ICD-10-CM | POA: Diagnosis not present

## 2017-09-14 DIAGNOSIS — M2021 Hallux rigidus, right foot: Secondary | ICD-10-CM | POA: Diagnosis not present

## 2017-10-03 DIAGNOSIS — M545 Low back pain: Secondary | ICD-10-CM | POA: Diagnosis not present

## 2017-10-03 DIAGNOSIS — R071 Chest pain on breathing: Secondary | ICD-10-CM | POA: Diagnosis not present

## 2017-10-03 DIAGNOSIS — M5412 Radiculopathy, cervical region: Secondary | ICD-10-CM | POA: Diagnosis not present

## 2017-10-03 DIAGNOSIS — F329 Major depressive disorder, single episode, unspecified: Secondary | ICD-10-CM | POA: Diagnosis not present

## 2017-10-06 DIAGNOSIS — M79643 Pain in unspecified hand: Secondary | ICD-10-CM | POA: Diagnosis not present

## 2017-10-06 DIAGNOSIS — M79641 Pain in right hand: Secondary | ICD-10-CM | POA: Diagnosis not present

## 2017-10-06 DIAGNOSIS — M79642 Pain in left hand: Secondary | ICD-10-CM | POA: Diagnosis not present

## 2017-10-06 DIAGNOSIS — R768 Other specified abnormal immunological findings in serum: Secondary | ICD-10-CM | POA: Diagnosis not present

## 2017-10-06 DIAGNOSIS — M2550 Pain in unspecified joint: Secondary | ICD-10-CM | POA: Diagnosis not present

## 2017-10-06 DIAGNOSIS — M1990 Unspecified osteoarthritis, unspecified site: Secondary | ICD-10-CM | POA: Diagnosis not present

## 2017-10-06 DIAGNOSIS — M79673 Pain in unspecified foot: Secondary | ICD-10-CM | POA: Diagnosis not present

## 2017-10-12 DIAGNOSIS — M7741 Metatarsalgia, right foot: Secondary | ICD-10-CM | POA: Diagnosis not present

## 2017-10-12 DIAGNOSIS — J209 Acute bronchitis, unspecified: Secondary | ICD-10-CM | POA: Diagnosis not present

## 2017-10-12 DIAGNOSIS — M2021 Hallux rigidus, right foot: Secondary | ICD-10-CM | POA: Diagnosis not present

## 2017-10-12 DIAGNOSIS — Z6830 Body mass index (BMI) 30.0-30.9, adult: Secondary | ICD-10-CM | POA: Diagnosis not present

## 2017-10-20 DIAGNOSIS — M79643 Pain in unspecified hand: Secondary | ICD-10-CM | POA: Diagnosis not present

## 2017-10-20 DIAGNOSIS — M1990 Unspecified osteoarthritis, unspecified site: Secondary | ICD-10-CM | POA: Diagnosis not present

## 2017-10-20 DIAGNOSIS — R768 Other specified abnormal immunological findings in serum: Secondary | ICD-10-CM | POA: Diagnosis not present

## 2017-10-20 DIAGNOSIS — M2550 Pain in unspecified joint: Secondary | ICD-10-CM | POA: Diagnosis not present

## 2017-10-20 DIAGNOSIS — Z13820 Encounter for screening for osteoporosis: Secondary | ICD-10-CM | POA: Diagnosis not present

## 2017-10-21 DIAGNOSIS — M238X1 Other internal derangements of right knee: Secondary | ICD-10-CM | POA: Diagnosis not present

## 2017-10-28 DIAGNOSIS — M238X1 Other internal derangements of right knee: Secondary | ICD-10-CM | POA: Diagnosis not present

## 2017-11-02 DIAGNOSIS — S83241A Other tear of medial meniscus, current injury, right knee, initial encounter: Secondary | ICD-10-CM | POA: Diagnosis not present

## 2017-11-02 DIAGNOSIS — M238X1 Other internal derangements of right knee: Secondary | ICD-10-CM | POA: Diagnosis not present

## 2017-11-03 DIAGNOSIS — K5900 Constipation, unspecified: Secondary | ICD-10-CM | POA: Diagnosis not present

## 2017-11-03 DIAGNOSIS — Z79891 Long term (current) use of opiate analgesic: Secondary | ICD-10-CM | POA: Diagnosis not present

## 2017-11-03 DIAGNOSIS — G894 Chronic pain syndrome: Secondary | ICD-10-CM | POA: Diagnosis not present

## 2017-11-03 DIAGNOSIS — M47812 Spondylosis without myelopathy or radiculopathy, cervical region: Secondary | ICD-10-CM | POA: Diagnosis not present

## 2017-11-07 DIAGNOSIS — M2041 Other hammer toe(s) (acquired), right foot: Secondary | ICD-10-CM | POA: Diagnosis not present

## 2017-12-05 DIAGNOSIS — M47812 Spondylosis without myelopathy or radiculopathy, cervical region: Secondary | ICD-10-CM | POA: Diagnosis not present

## 2017-12-05 DIAGNOSIS — Z79891 Long term (current) use of opiate analgesic: Secondary | ICD-10-CM | POA: Diagnosis not present

## 2017-12-05 DIAGNOSIS — G894 Chronic pain syndrome: Secondary | ICD-10-CM | POA: Diagnosis not present

## 2017-12-05 DIAGNOSIS — K5900 Constipation, unspecified: Secondary | ICD-10-CM | POA: Diagnosis not present

## 2017-12-14 DIAGNOSIS — S83241A Other tear of medial meniscus, current injury, right knee, initial encounter: Secondary | ICD-10-CM | POA: Diagnosis not present

## 2017-12-15 DIAGNOSIS — F0632 Mood disorder due to known physiological condition with major depressive-like episode: Secondary | ICD-10-CM | POA: Diagnosis not present

## 2017-12-15 DIAGNOSIS — F902 Attention-deficit hyperactivity disorder, combined type: Secondary | ICD-10-CM | POA: Diagnosis not present

## 2017-12-27 DIAGNOSIS — Z79899 Other long term (current) drug therapy: Secondary | ICD-10-CM | POA: Diagnosis not present

## 2017-12-27 DIAGNOSIS — Z01818 Encounter for other preprocedural examination: Secondary | ICD-10-CM | POA: Diagnosis not present

## 2018-01-02 DIAGNOSIS — M23331 Other meniscus derangements, other medial meniscus, right knee: Secondary | ICD-10-CM | POA: Diagnosis not present

## 2018-01-02 DIAGNOSIS — M25561 Pain in right knee: Secondary | ICD-10-CM | POA: Diagnosis not present

## 2018-01-02 DIAGNOSIS — M7542 Impingement syndrome of left shoulder: Secondary | ICD-10-CM | POA: Diagnosis not present

## 2018-01-02 DIAGNOSIS — Y999 Unspecified external cause status: Secondary | ICD-10-CM | POA: Diagnosis not present

## 2018-01-02 DIAGNOSIS — M94261 Chondromalacia, right knee: Secondary | ICD-10-CM | POA: Diagnosis not present

## 2018-01-02 DIAGNOSIS — W051XXA Fall from non-moving nonmotorized scooter, initial encounter: Secondary | ICD-10-CM | POA: Diagnosis not present

## 2018-01-02 DIAGNOSIS — S83231A Complex tear of medial meniscus, current injury, right knee, initial encounter: Secondary | ICD-10-CM | POA: Diagnosis not present

## 2018-01-02 DIAGNOSIS — G8918 Other acute postprocedural pain: Secondary | ICD-10-CM | POA: Diagnosis not present

## 2018-01-12 ENCOUNTER — Telehealth (HOSPITAL_COMMUNITY): Payer: Self-pay

## 2018-01-12 NOTE — Telephone Encounter (Signed)
Offered to move pt up for eval she did not want to come in any earlier and requested to be moved from the waitlist. NF 01/13/2018

## 2018-01-19 ENCOUNTER — Encounter (HOSPITAL_COMMUNITY): Payer: Self-pay

## 2018-01-19 ENCOUNTER — Telehealth (HOSPITAL_COMMUNITY): Payer: Self-pay | Admitting: Physical Therapy

## 2018-01-19 ENCOUNTER — Ambulatory Visit (HOSPITAL_COMMUNITY): Payer: Medicare Other | Admitting: Physical Therapy

## 2018-01-19 NOTE — Telephone Encounter (Signed)
Patient donot have any transportation

## 2018-01-27 DIAGNOSIS — R05 Cough: Secondary | ICD-10-CM | POA: Diagnosis not present

## 2018-01-27 DIAGNOSIS — J209 Acute bronchitis, unspecified: Secondary | ICD-10-CM | POA: Diagnosis not present

## 2018-01-27 DIAGNOSIS — K219 Gastro-esophageal reflux disease without esophagitis: Secondary | ICD-10-CM | POA: Diagnosis not present

## 2018-01-27 DIAGNOSIS — R062 Wheezing: Secondary | ICD-10-CM | POA: Diagnosis not present

## 2018-01-30 ENCOUNTER — Telehealth (HOSPITAL_COMMUNITY): Payer: Self-pay

## 2018-01-30 NOTE — Telephone Encounter (Signed)
Pt has surgery and doesn't need PT now, she wants to be d/c. NF 01/30/18

## 2018-01-31 ENCOUNTER — Encounter (HOSPITAL_COMMUNITY): Payer: Self-pay

## 2018-01-31 ENCOUNTER — Ambulatory Visit (HOSPITAL_COMMUNITY): Payer: BLUE CROSS/BLUE SHIELD

## 2018-02-03 DIAGNOSIS — M25512 Pain in left shoulder: Secondary | ICD-10-CM | POA: Diagnosis not present

## 2018-02-13 DIAGNOSIS — S83241D Other tear of medial meniscus, current injury, right knee, subsequent encounter: Secondary | ICD-10-CM | POA: Diagnosis not present

## 2018-02-17 DIAGNOSIS — Z79891 Long term (current) use of opiate analgesic: Secondary | ICD-10-CM | POA: Diagnosis not present

## 2018-02-17 DIAGNOSIS — M47812 Spondylosis without myelopathy or radiculopathy, cervical region: Secondary | ICD-10-CM | POA: Diagnosis not present

## 2018-02-17 DIAGNOSIS — G894 Chronic pain syndrome: Secondary | ICD-10-CM | POA: Diagnosis not present

## 2018-02-17 DIAGNOSIS — K5900 Constipation, unspecified: Secondary | ICD-10-CM | POA: Diagnosis not present

## 2018-02-20 DIAGNOSIS — M1990 Unspecified osteoarthritis, unspecified site: Secondary | ICD-10-CM | POA: Diagnosis not present

## 2018-02-20 DIAGNOSIS — M2550 Pain in unspecified joint: Secondary | ICD-10-CM | POA: Diagnosis not present

## 2018-02-20 DIAGNOSIS — M79643 Pain in unspecified hand: Secondary | ICD-10-CM | POA: Diagnosis not present

## 2018-02-20 DIAGNOSIS — R768 Other specified abnormal immunological findings in serum: Secondary | ICD-10-CM | POA: Diagnosis not present

## 2018-02-22 DIAGNOSIS — M7741 Metatarsalgia, right foot: Secondary | ICD-10-CM | POA: Diagnosis not present

## 2018-02-22 DIAGNOSIS — M79671 Pain in right foot: Secondary | ICD-10-CM | POA: Diagnosis not present

## 2018-02-22 DIAGNOSIS — M2041 Other hammer toe(s) (acquired), right foot: Secondary | ICD-10-CM | POA: Diagnosis not present

## 2018-02-22 DIAGNOSIS — Z978 Presence of other specified devices: Secondary | ICD-10-CM | POA: Diagnosis not present

## 2018-02-28 ENCOUNTER — Other Ambulatory Visit: Payer: Self-pay | Admitting: Orthopedic Surgery

## 2018-02-28 DIAGNOSIS — Z978 Presence of other specified devices: Secondary | ICD-10-CM

## 2018-03-17 DIAGNOSIS — M47812 Spondylosis without myelopathy or radiculopathy, cervical region: Secondary | ICD-10-CM | POA: Diagnosis not present

## 2018-03-17 DIAGNOSIS — K5900 Constipation, unspecified: Secondary | ICD-10-CM | POA: Diagnosis not present

## 2018-03-17 DIAGNOSIS — Z79891 Long term (current) use of opiate analgesic: Secondary | ICD-10-CM | POA: Diagnosis not present

## 2018-03-17 DIAGNOSIS — G894 Chronic pain syndrome: Secondary | ICD-10-CM | POA: Diagnosis not present

## 2018-03-27 DIAGNOSIS — S83241A Other tear of medial meniscus, current injury, right knee, initial encounter: Secondary | ICD-10-CM | POA: Diagnosis not present

## 2018-04-11 ENCOUNTER — Ambulatory Visit
Admission: RE | Admit: 2018-04-11 | Discharge: 2018-04-11 | Disposition: A | Discharge status: Home / Self Care | Payer: BLUE CROSS/BLUE SHIELD | Source: Ambulatory Visit | Attending: Orthopedic Surgery | Admitting: Orthopedic Surgery

## 2018-04-11 DIAGNOSIS — Z978 Presence of other specified devices: Secondary | ICD-10-CM

## 2018-04-11 DIAGNOSIS — M79671 Pain in right foot: Secondary | ICD-10-CM | POA: Diagnosis not present

## 2018-04-13 DIAGNOSIS — M47812 Spondylosis without myelopathy or radiculopathy, cervical region: Secondary | ICD-10-CM | POA: Diagnosis not present

## 2018-04-13 DIAGNOSIS — Z79891 Long term (current) use of opiate analgesic: Secondary | ICD-10-CM | POA: Diagnosis not present

## 2018-04-13 DIAGNOSIS — G894 Chronic pain syndrome: Secondary | ICD-10-CM | POA: Diagnosis not present

## 2018-04-13 DIAGNOSIS — K5900 Constipation, unspecified: Secondary | ICD-10-CM | POA: Diagnosis not present

## 2018-04-19 DIAGNOSIS — F0632 Mood disorder due to known physiological condition with major depressive-like episode: Secondary | ICD-10-CM | POA: Diagnosis not present

## 2018-04-19 DIAGNOSIS — F902 Attention-deficit hyperactivity disorder, combined type: Secondary | ICD-10-CM | POA: Diagnosis not present

## 2018-04-26 DIAGNOSIS — M7051 Other bursitis of knee, right knee: Secondary | ICD-10-CM | POA: Diagnosis not present

## 2018-04-26 DIAGNOSIS — S83241A Other tear of medial meniscus, current injury, right knee, initial encounter: Secondary | ICD-10-CM | POA: Diagnosis not present

## 2018-05-03 DIAGNOSIS — M1711 Unilateral primary osteoarthritis, right knee: Secondary | ICD-10-CM | POA: Diagnosis not present

## 2018-05-10 DIAGNOSIS — Z0000 Encounter for general adult medical examination without abnormal findings: Secondary | ICD-10-CM | POA: Diagnosis not present

## 2018-05-10 DIAGNOSIS — R7301 Impaired fasting glucose: Secondary | ICD-10-CM | POA: Diagnosis not present

## 2018-05-17 DIAGNOSIS — R7301 Impaired fasting glucose: Secondary | ICD-10-CM | POA: Diagnosis not present

## 2018-05-17 DIAGNOSIS — M79671 Pain in right foot: Secondary | ICD-10-CM | POA: Diagnosis not present

## 2018-05-17 DIAGNOSIS — H00019 Hordeolum externum unspecified eye, unspecified eyelid: Secondary | ICD-10-CM | POA: Diagnosis not present

## 2018-05-17 DIAGNOSIS — E668 Other obesity: Secondary | ICD-10-CM | POA: Diagnosis not present

## 2018-05-17 DIAGNOSIS — Z0000 Encounter for general adult medical examination without abnormal findings: Secondary | ICD-10-CM | POA: Diagnosis not present

## 2018-05-19 DIAGNOSIS — M47812 Spondylosis without myelopathy or radiculopathy, cervical region: Secondary | ICD-10-CM | POA: Diagnosis not present

## 2018-05-19 DIAGNOSIS — K5900 Constipation, unspecified: Secondary | ICD-10-CM | POA: Diagnosis not present

## 2018-05-19 DIAGNOSIS — G894 Chronic pain syndrome: Secondary | ICD-10-CM | POA: Diagnosis not present

## 2018-05-19 DIAGNOSIS — Z79891 Long term (current) use of opiate analgesic: Secondary | ICD-10-CM | POA: Diagnosis not present

## 2018-05-23 DIAGNOSIS — M2041 Other hammer toe(s) (acquired), right foot: Secondary | ICD-10-CM | POA: Diagnosis not present

## 2018-05-23 DIAGNOSIS — G8918 Other acute postprocedural pain: Secondary | ICD-10-CM | POA: Diagnosis not present

## 2018-05-23 DIAGNOSIS — M7741 Metatarsalgia, right foot: Secondary | ICD-10-CM | POA: Diagnosis not present

## 2018-05-23 DIAGNOSIS — T8484XA Pain due to internal orthopedic prosthetic devices, implants and grafts, initial encounter: Secondary | ICD-10-CM | POA: Diagnosis not present

## 2018-06-28 DIAGNOSIS — M546 Pain in thoracic spine: Secondary | ICD-10-CM | POA: Diagnosis not present

## 2018-06-28 DIAGNOSIS — R0609 Other forms of dyspnea: Secondary | ICD-10-CM | POA: Diagnosis not present

## 2018-06-28 DIAGNOSIS — G894 Chronic pain syndrome: Secondary | ICD-10-CM | POA: Diagnosis not present

## 2018-06-28 DIAGNOSIS — Z6831 Body mass index (BMI) 31.0-31.9, adult: Secondary | ICD-10-CM | POA: Diagnosis not present

## 2018-06-29 DIAGNOSIS — R0600 Dyspnea, unspecified: Secondary | ICD-10-CM | POA: Diagnosis not present

## 2018-07-12 DIAGNOSIS — F902 Attention-deficit hyperactivity disorder, combined type: Secondary | ICD-10-CM | POA: Diagnosis not present

## 2018-07-12 DIAGNOSIS — M79671 Pain in right foot: Secondary | ICD-10-CM | POA: Diagnosis not present

## 2018-07-12 DIAGNOSIS — F0632 Mood disorder due to known physiological condition with major depressive-like episode: Secondary | ICD-10-CM | POA: Diagnosis not present

## 2018-07-14 DIAGNOSIS — M1711 Unilateral primary osteoarthritis, right knee: Secondary | ICD-10-CM | POA: Diagnosis not present

## 2018-07-17 DIAGNOSIS — K5900 Constipation, unspecified: Secondary | ICD-10-CM | POA: Diagnosis not present

## 2018-07-17 DIAGNOSIS — Z79891 Long term (current) use of opiate analgesic: Secondary | ICD-10-CM | POA: Diagnosis not present

## 2018-07-17 DIAGNOSIS — G894 Chronic pain syndrome: Secondary | ICD-10-CM | POA: Diagnosis not present

## 2018-07-17 DIAGNOSIS — M47812 Spondylosis without myelopathy or radiculopathy, cervical region: Secondary | ICD-10-CM | POA: Diagnosis not present

## 2018-07-24 DIAGNOSIS — M25511 Pain in right shoulder: Secondary | ICD-10-CM | POA: Diagnosis not present

## 2018-07-24 DIAGNOSIS — G894 Chronic pain syndrome: Secondary | ICD-10-CM | POA: Diagnosis not present

## 2018-07-24 DIAGNOSIS — M47812 Spondylosis without myelopathy or radiculopathy, cervical region: Secondary | ICD-10-CM | POA: Diagnosis not present

## 2018-07-24 DIAGNOSIS — Z79891 Long term (current) use of opiate analgesic: Secondary | ICD-10-CM | POA: Diagnosis not present

## 2018-07-26 DIAGNOSIS — M67911 Unspecified disorder of synovium and tendon, right shoulder: Secondary | ICD-10-CM | POA: Diagnosis not present

## 2018-07-26 DIAGNOSIS — M67912 Unspecified disorder of synovium and tendon, left shoulder: Secondary | ICD-10-CM | POA: Diagnosis not present

## 2018-08-21 DIAGNOSIS — Z79891 Long term (current) use of opiate analgesic: Secondary | ICD-10-CM | POA: Diagnosis not present

## 2018-08-21 DIAGNOSIS — M25511 Pain in right shoulder: Secondary | ICD-10-CM | POA: Diagnosis not present

## 2018-08-21 DIAGNOSIS — M47812 Spondylosis without myelopathy or radiculopathy, cervical region: Secondary | ICD-10-CM | POA: Diagnosis not present

## 2018-08-21 DIAGNOSIS — G894 Chronic pain syndrome: Secondary | ICD-10-CM | POA: Diagnosis not present

## 2018-08-22 DIAGNOSIS — M2550 Pain in unspecified joint: Secondary | ICD-10-CM | POA: Diagnosis not present

## 2018-08-22 DIAGNOSIS — M1990 Unspecified osteoarthritis, unspecified site: Secondary | ICD-10-CM | POA: Diagnosis not present

## 2018-08-22 DIAGNOSIS — M79643 Pain in unspecified hand: Secondary | ICD-10-CM | POA: Diagnosis not present

## 2018-08-22 DIAGNOSIS — R768 Other specified abnormal immunological findings in serum: Secondary | ICD-10-CM | POA: Diagnosis not present

## 2018-08-29 DIAGNOSIS — M7051 Other bursitis of knee, right knee: Secondary | ICD-10-CM | POA: Diagnosis not present

## 2018-08-29 DIAGNOSIS — M170 Bilateral primary osteoarthritis of knee: Secondary | ICD-10-CM | POA: Diagnosis not present

## 2018-09-12 DIAGNOSIS — M1711 Unilateral primary osteoarthritis, right knee: Secondary | ICD-10-CM | POA: Diagnosis not present

## 2018-09-14 DIAGNOSIS — F902 Attention-deficit hyperactivity disorder, combined type: Secondary | ICD-10-CM | POA: Diagnosis not present

## 2018-09-14 DIAGNOSIS — F0632 Mood disorder due to known physiological condition with major depressive-like episode: Secondary | ICD-10-CM | POA: Diagnosis not present

## 2018-09-18 DIAGNOSIS — M25511 Pain in right shoulder: Secondary | ICD-10-CM | POA: Diagnosis not present

## 2018-09-18 DIAGNOSIS — G894 Chronic pain syndrome: Secondary | ICD-10-CM | POA: Diagnosis not present

## 2018-09-18 DIAGNOSIS — M47812 Spondylosis without myelopathy or radiculopathy, cervical region: Secondary | ICD-10-CM | POA: Diagnosis not present

## 2018-09-18 DIAGNOSIS — Z79891 Long term (current) use of opiate analgesic: Secondary | ICD-10-CM | POA: Diagnosis not present

## 2018-09-22 DIAGNOSIS — M1711 Unilateral primary osteoarthritis, right knee: Secondary | ICD-10-CM | POA: Diagnosis not present

## 2018-09-26 DIAGNOSIS — M2550 Pain in unspecified joint: Secondary | ICD-10-CM | POA: Diagnosis not present

## 2018-09-26 DIAGNOSIS — M1990 Unspecified osteoarthritis, unspecified site: Secondary | ICD-10-CM | POA: Diagnosis not present

## 2018-09-26 DIAGNOSIS — M79643 Pain in unspecified hand: Secondary | ICD-10-CM | POA: Diagnosis not present

## 2018-09-26 DIAGNOSIS — R768 Other specified abnormal immunological findings in serum: Secondary | ICD-10-CM | POA: Diagnosis not present

## 2018-10-02 DIAGNOSIS — M1711 Unilateral primary osteoarthritis, right knee: Secondary | ICD-10-CM | POA: Diagnosis not present

## 2018-11-13 DIAGNOSIS — M1711 Unilateral primary osteoarthritis, right knee: Secondary | ICD-10-CM | POA: Diagnosis not present

## 2018-11-14 DIAGNOSIS — G894 Chronic pain syndrome: Secondary | ICD-10-CM | POA: Diagnosis not present

## 2018-11-14 DIAGNOSIS — Z79891 Long term (current) use of opiate analgesic: Secondary | ICD-10-CM | POA: Diagnosis not present

## 2018-11-14 DIAGNOSIS — M25511 Pain in right shoulder: Secondary | ICD-10-CM | POA: Diagnosis not present

## 2018-11-14 DIAGNOSIS — M47812 Spondylosis without myelopathy or radiculopathy, cervical region: Secondary | ICD-10-CM | POA: Diagnosis not present

## 2018-12-13 DIAGNOSIS — M47812 Spondylosis without myelopathy or radiculopathy, cervical region: Secondary | ICD-10-CM | POA: Diagnosis not present

## 2018-12-13 DIAGNOSIS — K5900 Constipation, unspecified: Secondary | ICD-10-CM | POA: Diagnosis not present

## 2018-12-13 DIAGNOSIS — G894 Chronic pain syndrome: Secondary | ICD-10-CM | POA: Diagnosis not present

## 2018-12-13 DIAGNOSIS — Z79891 Long term (current) use of opiate analgesic: Secondary | ICD-10-CM | POA: Diagnosis not present

## 2019-01-11 DIAGNOSIS — M47812 Spondylosis without myelopathy or radiculopathy, cervical region: Secondary | ICD-10-CM | POA: Diagnosis not present

## 2019-01-11 DIAGNOSIS — M150 Primary generalized (osteo)arthritis: Secondary | ICD-10-CM | POA: Diagnosis not present

## 2019-01-11 DIAGNOSIS — G894 Chronic pain syndrome: Secondary | ICD-10-CM | POA: Diagnosis not present

## 2019-01-11 DIAGNOSIS — F0632 Mood disorder due to known physiological condition with major depressive-like episode: Secondary | ICD-10-CM | POA: Diagnosis not present

## 2019-01-11 DIAGNOSIS — F902 Attention-deficit hyperactivity disorder, combined type: Secondary | ICD-10-CM | POA: Diagnosis not present

## 2019-01-11 DIAGNOSIS — Z79891 Long term (current) use of opiate analgesic: Secondary | ICD-10-CM | POA: Diagnosis not present

## 2019-02-07 DIAGNOSIS — Z79891 Long term (current) use of opiate analgesic: Secondary | ICD-10-CM | POA: Diagnosis not present

## 2019-02-07 DIAGNOSIS — G894 Chronic pain syndrome: Secondary | ICD-10-CM | POA: Diagnosis not present

## 2019-02-07 DIAGNOSIS — M47812 Spondylosis without myelopathy or radiculopathy, cervical region: Secondary | ICD-10-CM | POA: Diagnosis not present

## 2019-02-07 DIAGNOSIS — M150 Primary generalized (osteo)arthritis: Secondary | ICD-10-CM | POA: Diagnosis not present

## 2019-02-13 DIAGNOSIS — M1711 Unilateral primary osteoarthritis, right knee: Secondary | ICD-10-CM | POA: Diagnosis not present

## 2019-03-08 DIAGNOSIS — F0632 Mood disorder due to known physiological condition with major depressive-like episode: Secondary | ICD-10-CM | POA: Diagnosis not present

## 2019-03-08 DIAGNOSIS — M150 Primary generalized (osteo)arthritis: Secondary | ICD-10-CM | POA: Diagnosis not present

## 2019-03-08 DIAGNOSIS — F902 Attention-deficit hyperactivity disorder, combined type: Secondary | ICD-10-CM | POA: Diagnosis not present

## 2019-03-08 DIAGNOSIS — G894 Chronic pain syndrome: Secondary | ICD-10-CM | POA: Diagnosis not present

## 2019-03-08 DIAGNOSIS — M47812 Spondylosis without myelopathy or radiculopathy, cervical region: Secondary | ICD-10-CM | POA: Diagnosis not present

## 2019-03-08 DIAGNOSIS — Z79891 Long term (current) use of opiate analgesic: Secondary | ICD-10-CM | POA: Diagnosis not present

## 2019-03-27 DIAGNOSIS — M1711 Unilateral primary osteoarthritis, right knee: Secondary | ICD-10-CM | POA: Diagnosis not present

## 2019-04-03 DIAGNOSIS — M1711 Unilateral primary osteoarthritis, right knee: Secondary | ICD-10-CM | POA: Diagnosis not present

## 2019-04-05 DIAGNOSIS — G894 Chronic pain syndrome: Secondary | ICD-10-CM | POA: Diagnosis not present

## 2019-04-05 DIAGNOSIS — M47812 Spondylosis without myelopathy or radiculopathy, cervical region: Secondary | ICD-10-CM | POA: Diagnosis not present

## 2019-04-05 DIAGNOSIS — Z79891 Long term (current) use of opiate analgesic: Secondary | ICD-10-CM | POA: Diagnosis not present

## 2019-04-05 DIAGNOSIS — M150 Primary generalized (osteo)arthritis: Secondary | ICD-10-CM | POA: Diagnosis not present

## 2019-04-10 DIAGNOSIS — M1711 Unilateral primary osteoarthritis, right knee: Secondary | ICD-10-CM | POA: Diagnosis not present

## 2019-04-18 DIAGNOSIS — F902 Attention-deficit hyperactivity disorder, combined type: Secondary | ICD-10-CM | POA: Diagnosis not present

## 2019-04-18 DIAGNOSIS — F0632 Mood disorder due to known physiological condition with major depressive-like episode: Secondary | ICD-10-CM | POA: Diagnosis not present

## 2019-05-03 DIAGNOSIS — M47812 Spondylosis without myelopathy or radiculopathy, cervical region: Secondary | ICD-10-CM | POA: Diagnosis not present

## 2019-05-03 DIAGNOSIS — G894 Chronic pain syndrome: Secondary | ICD-10-CM | POA: Diagnosis not present

## 2019-05-03 DIAGNOSIS — M150 Primary generalized (osteo)arthritis: Secondary | ICD-10-CM | POA: Diagnosis not present

## 2019-05-03 DIAGNOSIS — Z79891 Long term (current) use of opiate analgesic: Secondary | ICD-10-CM | POA: Diagnosis not present

## 2019-05-17 DIAGNOSIS — R82998 Other abnormal findings in urine: Secondary | ICD-10-CM | POA: Diagnosis not present

## 2019-05-17 DIAGNOSIS — Z0000 Encounter for general adult medical examination without abnormal findings: Secondary | ICD-10-CM | POA: Diagnosis not present

## 2019-05-17 DIAGNOSIS — Z79899 Other long term (current) drug therapy: Secondary | ICD-10-CM | POA: Diagnosis not present

## 2019-05-17 DIAGNOSIS — R7301 Impaired fasting glucose: Secondary | ICD-10-CM | POA: Diagnosis not present

## 2019-05-21 DIAGNOSIS — G4733 Obstructive sleep apnea (adult) (pediatric): Secondary | ICD-10-CM | POA: Diagnosis not present

## 2019-05-21 DIAGNOSIS — K219 Gastro-esophageal reflux disease without esophagitis: Secondary | ICD-10-CM | POA: Diagnosis not present

## 2019-05-21 DIAGNOSIS — K5909 Other constipation: Secondary | ICD-10-CM | POA: Diagnosis not present

## 2019-05-21 DIAGNOSIS — Z0000 Encounter for general adult medical examination without abnormal findings: Secondary | ICD-10-CM | POA: Diagnosis not present

## 2019-05-21 DIAGNOSIS — R7301 Impaired fasting glucose: Secondary | ICD-10-CM | POA: Diagnosis not present

## 2019-05-21 DIAGNOSIS — M2550 Pain in unspecified joint: Secondary | ICD-10-CM | POA: Diagnosis not present

## 2019-05-21 DIAGNOSIS — Z1331 Encounter for screening for depression: Secondary | ICD-10-CM | POA: Diagnosis not present

## 2019-05-21 DIAGNOSIS — R634 Abnormal weight loss: Secondary | ICD-10-CM | POA: Diagnosis not present

## 2019-05-22 DIAGNOSIS — M25469 Effusion, unspecified knee: Secondary | ICD-10-CM | POA: Diagnosis not present

## 2019-05-22 DIAGNOSIS — M1711 Unilateral primary osteoarthritis, right knee: Secondary | ICD-10-CM | POA: Diagnosis not present

## 2019-05-28 DIAGNOSIS — M150 Primary generalized (osteo)arthritis: Secondary | ICD-10-CM | POA: Diagnosis not present

## 2019-05-28 DIAGNOSIS — G894 Chronic pain syndrome: Secondary | ICD-10-CM | POA: Diagnosis not present

## 2019-05-28 DIAGNOSIS — Z79891 Long term (current) use of opiate analgesic: Secondary | ICD-10-CM | POA: Diagnosis not present

## 2019-05-28 DIAGNOSIS — M47812 Spondylosis without myelopathy or radiculopathy, cervical region: Secondary | ICD-10-CM | POA: Diagnosis not present

## 2019-06-01 DIAGNOSIS — M1711 Unilateral primary osteoarthritis, right knee: Secondary | ICD-10-CM | POA: Diagnosis not present

## 2019-06-04 ENCOUNTER — Encounter: Payer: Self-pay | Admitting: Neurology

## 2019-06-05 ENCOUNTER — Institutional Professional Consult (permissible substitution): Payer: Medicare Other | Admitting: Neurology

## 2019-06-06 ENCOUNTER — Encounter: Payer: Self-pay | Admitting: Neurology

## 2019-06-07 DIAGNOSIS — E669 Obesity, unspecified: Secondary | ICD-10-CM | POA: Diagnosis not present

## 2019-06-07 DIAGNOSIS — R7301 Impaired fasting glucose: Secondary | ICD-10-CM | POA: Diagnosis not present

## 2019-06-18 DIAGNOSIS — M25871 Other specified joint disorders, right ankle and foot: Secondary | ICD-10-CM | POA: Diagnosis not present

## 2019-06-18 DIAGNOSIS — M79671 Pain in right foot: Secondary | ICD-10-CM | POA: Diagnosis not present

## 2019-06-18 DIAGNOSIS — L84 Corns and callosities: Secondary | ICD-10-CM | POA: Diagnosis not present

## 2019-06-19 DIAGNOSIS — M1711 Unilateral primary osteoarthritis, right knee: Secondary | ICD-10-CM | POA: Diagnosis not present

## 2019-06-19 DIAGNOSIS — M545 Low back pain: Secondary | ICD-10-CM | POA: Diagnosis not present

## 2019-06-26 ENCOUNTER — Ambulatory Visit (INDEPENDENT_AMBULATORY_CARE_PROVIDER_SITE_OTHER): Payer: BC Managed Care – PPO | Admitting: Neurology

## 2019-06-26 ENCOUNTER — Encounter: Payer: Self-pay | Admitting: Neurology

## 2019-06-26 ENCOUNTER — Other Ambulatory Visit: Payer: Self-pay | Admitting: Neurology

## 2019-06-26 ENCOUNTER — Other Ambulatory Visit: Payer: Self-pay

## 2019-06-26 VITALS — BP 91/63 | HR 98 | Temp 97.5°F | Resp 16 | Ht 64.0 in | Wt 170.0 lb

## 2019-06-26 DIAGNOSIS — G4733 Obstructive sleep apnea (adult) (pediatric): Secondary | ICD-10-CM | POA: Diagnosis not present

## 2019-06-26 DIAGNOSIS — Z79891 Long term (current) use of opiate analgesic: Secondary | ICD-10-CM

## 2019-06-26 DIAGNOSIS — G4710 Hypersomnia, unspecified: Secondary | ICD-10-CM | POA: Diagnosis not present

## 2019-06-26 DIAGNOSIS — M150 Primary generalized (osteo)arthritis: Secondary | ICD-10-CM | POA: Diagnosis not present

## 2019-06-26 DIAGNOSIS — R011 Cardiac murmur, unspecified: Secondary | ICD-10-CM

## 2019-06-26 DIAGNOSIS — G4719 Other hypersomnia: Secondary | ICD-10-CM | POA: Diagnosis not present

## 2019-06-26 DIAGNOSIS — M25561 Pain in right knee: Secondary | ICD-10-CM

## 2019-06-26 DIAGNOSIS — G4753 Recurrent isolated sleep paralysis: Secondary | ICD-10-CM

## 2019-06-26 DIAGNOSIS — M47812 Spondylosis without myelopathy or radiculopathy, cervical region: Secondary | ICD-10-CM | POA: Diagnosis not present

## 2019-06-26 DIAGNOSIS — F518 Other sleep disorders not due to a substance or known physiological condition: Secondary | ICD-10-CM

## 2019-06-26 DIAGNOSIS — Z9989 Dependence on other enabling machines and devices: Secondary | ICD-10-CM

## 2019-06-26 DIAGNOSIS — G894 Chronic pain syndrome: Secondary | ICD-10-CM | POA: Diagnosis not present

## 2019-06-26 DIAGNOSIS — G8929 Other chronic pain: Secondary | ICD-10-CM
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
## 2019-06-26 NOTE — Patient Instructions (Signed)
  Cultures is not necessarily will be preparing for an M SLT as a "mean sleep latency test.  This test is usually ordered to evaluate For narcolepsy.  It is essential that REM sleep suppressant medications are not in the patient's system.  These can be from many medication families.  Reviewing the patient's most current medication list.  lexapro 20 mg- go to 10 mg 5 weeks before sleep study, than every other day for 8 days 10 mg, and then off.  Lyrica- reduce to every other day, and stop 10 days prior to test. Oxycodone has to be held for 8 days.  Nuycenta 100 mg, we try to hold it for 8 days prior, too.  Ritalin 1 week off.

## 2019-07-04 ENCOUNTER — Encounter: Payer: Self-pay | Admitting: Neurology

## 2019-07-04 ENCOUNTER — Telehealth: Payer: Self-pay | Admitting: Neurology

## 2019-07-04 ENCOUNTER — Other Ambulatory Visit: Payer: Self-pay | Admitting: Neurology

## 2019-07-04 ENCOUNTER — Telehealth: Payer: Self-pay

## 2019-07-04 DIAGNOSIS — G4733 Obstructive sleep apnea (adult) (pediatric): Secondary | ICD-10-CM

## 2019-07-04 LAB — NARCOLEPSY EVALUATION
DQA1*01:02: NEGATIVE
DQB1*06:02: NEGATIVE

## 2019-07-04 NOTE — Telephone Encounter (Signed)
Pt has refused scheduling CPAP/MSLT. She states that she is unable to come off of Lexapro, Lyrica, Oxycodone, Nuycenta, and Ritalin at this time. Pt was under the impression that she needed to stop only one medication. Pt stated that she will have to talk to her PCP and not sure if now is a good time to stop all of those medicines. Pt states she will call back when ready to schedule.

## 2019-07-04 NOTE — Telephone Encounter (Signed)
Called the patient to make aware of results. LVM informing I would send a mychart message. Advised in VM patient could call back with questions.

## 2019-07-04 NOTE — Telephone Encounter (Signed)
-----  Message from Larey Seat, MD sent at 07/04/2019  8:52 AM EDT ----- Negative HLA factor for narcolepsy trait , both alleles

## 2019-07-04 NOTE — Telephone Encounter (Signed)
She did not want to schedule either study at this time. She stated she needed to talk with her PCP and then she will call back when she is ready

## 2019-07-10 NOTE — Telephone Encounter (Signed)
Pt called back, Mychart message from [facility] on 08-26 was read to pt since pt stated she does not know how to read her my chart messages.  Pt is asking for a call from [facility] when she returns on Thursday the 3rd

## 2019-07-12 NOTE — Telephone Encounter (Signed)
Called the patient and there was no answer. LVM detailing that I saw where she didn't want to schedule her titration study at this time. Advised that [employer] can take her on as a patient with current information that was sent over. LVM with their number for her to call to get new supplies for her current machine. Advised if she had further questions to call me back.

## 2019-07-19 DIAGNOSIS — M47812 Spondylosis without myelopathy or radiculopathy, cervical region: Secondary | ICD-10-CM | POA: Diagnosis not present

## 2019-07-19 DIAGNOSIS — G894 Chronic pain syndrome: Secondary | ICD-10-CM | POA: Diagnosis not present

## 2019-07-19 DIAGNOSIS — Z79891 Long term (current) use of opiate analgesic: Secondary | ICD-10-CM | POA: Diagnosis not present

## 2019-07-19 DIAGNOSIS — F902 Attention-deficit hyperactivity disorder, combined type: Secondary | ICD-10-CM | POA: Diagnosis not present

## 2019-07-19 DIAGNOSIS — F0632 Mood disorder due to known physiological condition with major depressive-like episode: Secondary | ICD-10-CM | POA: Diagnosis not present

## 2019-07-19 DIAGNOSIS — M150 Primary generalized (osteo)arthritis: Secondary | ICD-10-CM | POA: Diagnosis not present

## 2019-08-06 DIAGNOSIS — R110 Nausea: Secondary | ICD-10-CM | POA: Diagnosis not present

## 2019-08-06 DIAGNOSIS — M797 Fibromyalgia: Secondary | ICD-10-CM | POA: Diagnosis not present

## 2019-08-06 DIAGNOSIS — M19041 Primary osteoarthritis, right hand: Secondary | ICD-10-CM | POA: Diagnosis not present

## 2019-08-06 DIAGNOSIS — G894 Chronic pain syndrome: Secondary | ICD-10-CM | POA: Diagnosis not present

## 2019-08-06 DIAGNOSIS — M19042 Primary osteoarthritis, left hand: Secondary | ICD-10-CM | POA: Diagnosis not present

## 2019-08-06 DIAGNOSIS — M1812 Unilateral primary osteoarthritis of first carpometacarpal joint, left hand: Secondary | ICD-10-CM | POA: Diagnosis not present

## 2019-08-06 DIAGNOSIS — M79641 Pain in right hand: Secondary | ICD-10-CM | POA: Diagnosis not present

## 2019-08-06 DIAGNOSIS — M79642 Pain in left hand: Secondary | ICD-10-CM | POA: Diagnosis not present

## 2019-08-06 DIAGNOSIS — K5909 Other constipation: Secondary | ICD-10-CM | POA: Diagnosis not present

## 2019-08-09 ENCOUNTER — Telehealth: Payer: Self-pay | Admitting: Neurology

## 2019-08-09 NOTE — Telephone Encounter (Signed)
At the end of the patient's most recent visit I discussed with her that I may have everything needed to get her transferred and set up with a company that accepts her insurance. Advised the patient I would send to [facility] for her to attempt getting her set up.   Received a notification from [facility],  "This was a TOC for supplies  We have called patient and LVM on 07/12/19  On 9/11 - pt returned call but was not "happy" that we could not give her an "accurate" cost of supplies as we only give estimates. She stated she would discuss with her husband and call us back.  Caryl Pina reached back out on 08/01/19 and LVM with our number  Just FYI - pt account is notated if she decides to get supplies. Order voided."  If patient would like to move forward with getting supplies through them she will have to contact [facility] to set up.

## 2019-08-16 DIAGNOSIS — G894 Chronic pain syndrome: Secondary | ICD-10-CM | POA: Diagnosis not present

## 2019-08-16 DIAGNOSIS — Z79891 Long term (current) use of opiate analgesic: Secondary | ICD-10-CM | POA: Diagnosis not present

## 2019-08-16 DIAGNOSIS — M47812 Spondylosis without myelopathy or radiculopathy, cervical region: Secondary | ICD-10-CM | POA: Diagnosis not present

## 2019-08-16 DIAGNOSIS — M150 Primary generalized (osteo)arthritis: Secondary | ICD-10-CM | POA: Diagnosis not present

## 2019-08-22 DIAGNOSIS — R7301 Impaired fasting glucose: Secondary | ICD-10-CM | POA: Diagnosis not present

## 2019-08-22 DIAGNOSIS — E669 Obesity, unspecified: Secondary | ICD-10-CM | POA: Diagnosis not present

## 2019-09-05 DIAGNOSIS — F0632 Mood disorder due to known physiological condition with major depressive-like episode: Secondary | ICD-10-CM | POA: Diagnosis not present

## 2019-09-05 DIAGNOSIS — F902 Attention-deficit hyperactivity disorder, combined type: Secondary | ICD-10-CM | POA: Diagnosis not present

## 2019-09-13 DIAGNOSIS — M47812 Spondylosis without myelopathy or radiculopathy, cervical region: Secondary | ICD-10-CM | POA: Diagnosis not present

## 2019-09-13 DIAGNOSIS — M150 Primary generalized (osteo)arthritis: Secondary | ICD-10-CM | POA: Diagnosis not present

## 2019-09-13 DIAGNOSIS — Z79891 Long term (current) use of opiate analgesic: Secondary | ICD-10-CM | POA: Diagnosis not present

## 2019-09-13 DIAGNOSIS — G894 Chronic pain syndrome: Secondary | ICD-10-CM | POA: Diagnosis not present

## 2019-10-11 DIAGNOSIS — M47812 Spondylosis without myelopathy or radiculopathy, cervical region: Secondary | ICD-10-CM | POA: Diagnosis not present

## 2019-10-11 DIAGNOSIS — M150 Primary generalized (osteo)arthritis: Secondary | ICD-10-CM | POA: Diagnosis not present

## 2019-10-11 DIAGNOSIS — G894 Chronic pain syndrome: Secondary | ICD-10-CM | POA: Diagnosis not present

## 2019-10-11 DIAGNOSIS — Z79891 Long term (current) use of opiate analgesic: Secondary | ICD-10-CM | POA: Diagnosis not present

## 2019-12-05 NOTE — H&P (Signed)
Surgical History & Physical  Patient Name: [name] DOB: Oct 28, 1961  Surgery: Cataract extraction with intraocular lens implant phacoemulsification; Right Eye  Surgeon: Baruch Goldmann MD Surgery Date:  12/17/2019 Pre-Op Date:  11/22/2019  HPI: A 14 Yr. old female patient Pt presents for NP Cat eval, referred by Dr.Johnson in [facility]. Pt notes very blurred VA, OD>OS, affecting all ranges. Pt wears monovision CL's. Pt reports sensitivity to glare from headlights at night, pt states headlights look like "fire works". Pt denies dryness or itching, does not use any gtts. Pt notes occasional floaters, denies recent increase. Pt denies any past h/o ocular injury or sx. 1. 1. The patient complains of difficulty when viewing TV, reading closed caption, news scrolls on TV. Both eyes are affected. The episode is gradual. The condition's severity is moderate. Symptoms occur when the patient is driving and reading. This is negatively affecting the patient's quality of life. HPI was performed by Baruch Goldmann .  Medical History: Cataracts ADD, Depression Cancer  Review of Systems Negative Allergic/Immunologic Negative Cardiovascular Negative Constitutional Negative Ear, Nose, Mouth & Throat Negative Endocrine Negative Eyes Negative Gastrointestinal Negative Genitourinary Negative Hemotologic/Lymphatic Negative Integumentary Negative Musculoskeletal Negative Neurological Negative Psychiatry Negative Respiratory  Social   Current every day smoker   Medication Lexapro, Abilify, Nucynta, Propranolol, Ritalin, Melatonin,   Sx/Procedures Left shoulder replacement, Bunion Removal right foot x 3, Meniscus removal right knee, Wisdom Teeth Removal, Ganglion cyst removal from wrist, Bilateral Mastectomy, Breast reconstruction,   Drug Allergies  Sulfa,   History & Physical: Heent:  Cataract, Right eye NECK: supple without bruits LUNGS: lungs clear to auscultation CV: regular rate and  rhythm Abdomen: soft and non-tender  Impression & Plan: Assessment: 1.  COMBINED FORMS AGE RELATED CATARACT; Both Eyes (H25.813)  Plan: 1.  Cataract accounts for the patient's decreased vision. This visual impairment is not correctable with a tolerable change in glasses or contact lenses. Cataract surgery with an implantation of a new lens should significantly improve the visual and functional status of the patient. Discussed all risks, benefits, alternatives, and potential complications. Discussed the procedures and recovery. Patient desires to have surgery. A-scan ordered and performed today for intra-ocular lens calculations. The surgery will be performed in order to improve vision for driving, reading, and for eye examinations. Recommend phacoemulsification with intra-ocular lens. Right Eye. worse - first. Dilates well - shugarcaine by protocol. Multifocal Lens OD - likely [id number] as this is the patient's near eye.

## 2019-12-13 ENCOUNTER — Other Ambulatory Visit: Payer: Self-pay

## 2019-12-13 ENCOUNTER — Encounter (HOSPITAL_COMMUNITY): Payer: Self-pay

## 2019-12-14 ENCOUNTER — Other Ambulatory Visit (HOSPITAL_COMMUNITY)
Admission: RE | Admit: 2019-12-14 | Discharge: 2019-12-14 | Disposition: A | Discharge status: Home / Self Care | Payer: 59 | Source: Ambulatory Visit | Attending: Ophthalmology | Admitting: Ophthalmology

## 2019-12-14 ENCOUNTER — Encounter (HOSPITAL_COMMUNITY)
Admission: RE | Admit: 2019-12-14 | Discharge: 2019-12-14 | Disposition: A | Discharge status: Home / Self Care | Payer: 59 | Source: Ambulatory Visit | Attending: Ophthalmology | Admitting: Ophthalmology

## 2019-12-14 DIAGNOSIS — Z01812 Encounter for preprocedural laboratory examination: Secondary | ICD-10-CM | POA: Insufficient documentation

## 2019-12-14 DIAGNOSIS — Z20822 Contact with and (suspected) exposure to covid-19: Secondary | ICD-10-CM | POA: Diagnosis not present

## 2019-12-14 LAB — SARS CORONAVIRUS 2 (TAT 6-24 HRS): SARS Coronavirus 2: NEGATIVE

## 2019-12-17 ENCOUNTER — Encounter (HOSPITAL_COMMUNITY)
Admission: RE | Disposition: A | Discharge status: Home / Self Care | Payer: Self-pay | Source: Home / Self Care | Attending: Ophthalmology

## 2019-12-17 ENCOUNTER — Ambulatory Visit (HOSPITAL_COMMUNITY)
Admission: RE | Admit: 2019-12-17 | Discharge: 2019-12-17 | Disposition: A | Discharge status: Home / Self Care | Payer: No Typology Code available for payment source | Attending: Ophthalmology | Admitting: Ophthalmology

## 2019-12-17 ENCOUNTER — Ambulatory Visit (HOSPITAL_COMMUNITY): Payer: No Typology Code available for payment source | Admitting: Anesthesiology

## 2019-12-17 ENCOUNTER — Encounter (HOSPITAL_COMMUNITY): Payer: Self-pay | Admitting: Ophthalmology

## 2019-12-17 DIAGNOSIS — H25811 Combined forms of age-related cataract, right eye: Secondary | ICD-10-CM | POA: Diagnosis present

## 2019-12-17 DIAGNOSIS — Z79899 Other long term (current) drug therapy: Secondary | ICD-10-CM | POA: Insufficient documentation

## 2019-12-17 DIAGNOSIS — H25813 Combined forms of age-related cataract, bilateral: Secondary | ICD-10-CM | POA: Insufficient documentation

## 2019-12-17 DIAGNOSIS — F988 Other specified behavioral and emotional disorders with onset usually occurring in childhood and adolescence: Secondary | ICD-10-CM | POA: Diagnosis not present

## 2019-12-17 DIAGNOSIS — F329 Major depressive disorder, single episode, unspecified: Secondary | ICD-10-CM | POA: Insufficient documentation

## 2019-12-17 DIAGNOSIS — F17200 Nicotine dependence, unspecified, uncomplicated: Secondary | ICD-10-CM | POA: Diagnosis not present

## 2019-12-17 HISTORY — PX: CATARACT EXTRACTION W/PHACO: SHX586

## 2019-12-17 SURGERY — PHACOEMULSIFICATION, CATARACT, WITH IOL INSERTION
Anesthesia: Monitor Anesthesia Care | Site: Eye | Laterality: Right

## 2019-12-17 MED ORDER — POVIDONE-IODINE 5 % OP SOLN
OPHTHALMIC | Status: DC | PRN
  Administered 2019-12-17: 1 via OPHTHALMIC

## 2019-12-17 MED ORDER — EPINEPHRINE PF 1 MG/ML IJ SOLN
INTRAMUSCULAR | Status: AC
  Filled 2019-12-17: qty 2

## 2019-12-17 MED ORDER — PHENYLEPHRINE HCL 2.5 % OP SOLN
1.0000 [drp] | OPHTHALMIC | Status: AC | PRN
  Administered 2019-12-17 (×3): 1 [drp] via OPHTHALMIC

## 2019-12-17 MED ORDER — BSS IO SOLN
INTRAOCULAR | Status: DC | PRN
  Administered 2019-12-17: 15 mL via INTRAOCULAR

## 2019-12-17 MED ORDER — TETRACAINE HCL 0.5 % OP SOLN
1.0000 [drp] | OPHTHALMIC | Status: AC | PRN
  Administered 2019-12-17 (×3): 1 [drp] via OPHTHALMIC

## 2019-12-17 MED ORDER — CYCLOPENTOLATE-PHENYLEPHRINE 0.2-1 % OP SOLN
1.0000 [drp] | OPHTHALMIC | Status: AC | PRN
  Administered 2019-12-17 (×3): 1 [drp] via OPHTHALMIC

## 2019-12-17 MED ORDER — PROVISC 10 MG/ML IO SOLN
INTRAOCULAR | Status: DC | PRN
  Administered 2019-12-17: 0.85 mL via INTRAOCULAR

## 2019-12-17 MED ORDER — EPINEPHRINE PF 1 MG/ML IJ SOLN
INTRAOCULAR | Status: DC | PRN
  Administered 2019-12-17: 500 mL

## 2019-12-17 MED ORDER — MIDAZOLAM HCL 5 MG/5ML IJ SOLN
INTRAMUSCULAR | Status: DC | PRN
  Administered 2019-12-17: 2 mg via INTRAVENOUS

## 2019-12-17 MED ORDER — SODIUM HYALURONATE 23 MG/ML IO SOLN
INTRAOCULAR | Status: DC | PRN
  Administered 2019-12-17: 0.6 mL via INTRAOCULAR

## 2019-12-17 MED ORDER — LIDOCAINE HCL (PF) 1 % IJ SOLN
INTRAOCULAR | Status: DC | PRN
  Administered 2019-12-17: 09:00:00 1 mL via OPHTHALMIC

## 2019-12-17 MED ORDER — MIDAZOLAM HCL 2 MG/2ML IJ SOLN
INTRAMUSCULAR | Status: AC
  Filled 2019-12-17: qty 4

## 2019-12-17 MED ORDER — LIDOCAINE HCL 3.5 % OP GEL
1.0000 "application " | Freq: Once | OPHTHALMIC | Status: AC
  Administered 2019-12-17: 1 via OPHTHALMIC

## 2019-12-17 SURGICAL SUPPLY — 16 items
CLOTH BEACON ORANGE TIMEOUT ST (SAFETY) ×2 IMPLANT
EYE SHIELD UNIVERSAL CLEAR (GAUZE/BANDAGES/DRESSINGS) ×2 IMPLANT
GLOVE BIOGEL PI IND STRL 7.0 (GLOVE) IMPLANT
GLOVE BIOGEL PI INDICATOR 7.0 (GLOVE) ×4
LENS IOL MULTIFOCL +24.5 IMPLANT
LENS IOL MULTIFOCL TECNIS 24.5 ×3 IMPLANT
NDL HYPO 18GX1.5 BLUNT FILL (NEEDLE) IMPLANT
NEEDLE HYPO 18GX1.5 BLUNT FILL (NEEDLE) ×3 IMPLANT
PAD ARMBOARD 7.5X6 YLW CONV (MISCELLANEOUS) ×2 IMPLANT
PROC W SPEC LENS (INTRAOCULAR LENS) ×3
PROCESS W SPEC LENS (INTRAOCULAR LENS) IMPLANT
SYR TB 1ML LL NO SAFETY (SYRINGE) ×2 IMPLANT
TAPE SURG TRANSPORE 1 IN (GAUZE/BANDAGES/DRESSINGS) IMPLANT
TAPE SURGICAL TRANSPORE 1 IN (GAUZE/BANDAGES/DRESSINGS) ×2
VISCOELASTIC ADDITIONAL (OPHTHALMIC RELATED) ×2 IMPLANT
WATER STERILE IRR 250ML POUR (IV SOLUTION) ×2 IMPLANT

## 2019-12-17 NOTE — Discharge Instructions (Signed)
Please discharge patient when stable, will follow up today with Dr. Ossie Yebra at the [facility] office immediately following discharge.  Leave shield in place until visit.  All paperwork with discharge instructions will be given at the office.  [facility] Address:  [address]  [place], [place] [address]  

## 2019-12-17 NOTE — Anesthesia Preprocedure Evaluation (Signed)
Anesthesia Evaluation  Patient identified by MRN, date of birth, ID band Patient awake    Reviewed: Allergy & Precautions, NPO status , Patient's Chart, lab work & pertinent test results, reviewed documented beta blocker date and time   History of Anesthesia Complications (+) PONV and history of anesthetic complications  Airway Mallampati: II  TM Distance: >3 FB Neck ROM: Full    Dental no notable dental hx. (+) Teeth Intact   Pulmonary sleep apnea and Continuous Positive Airway Pressure Ventilation , former smoker,    Pulmonary exam normal breath sounds clear to auscultation       Cardiovascular Exercise Tolerance: Good (-) Valvular Problems/Murmurs (denied any heart problems) Rhythm:Regular Rate:Normal     Neuro/Psych  Headaches, PSYCHIATRIC DISORDERS Anxiety Depression  Neuromuscular disease    GI/Hepatic negative GI ROS, Neg liver ROS,   Endo/Other    Renal/GU negative Renal ROS  negative genitourinary   Musculoskeletal  (+) Arthritis , Osteoarthritis,  Fibromyalgia -  Abdominal   Peds negative pediatric ROS (+)  Hematology   Anesthesia Other Findings   Reproductive/Obstetrics                           Anesthesia Physical Anesthesia Plan  ASA: III  Anesthesia Plan: MAC   Post-op Pain Management:    Induction: Intravenous  PONV Risk Score and Plan:   Airway Management Planned: Nasal Cannula and Natural Airway  Additional Equipment:   Intra-op Plan:   Post-operative Plan:   Informed Consent: I have reviewed the patients History and Physical, chart, labs and discussed the procedure including the risks, benefits and alternatives for the proposed anesthesia with the patient or authorized representative who has indicated his/her understanding and acceptance.       Plan Discussed with: CRNA  Anesthesia Plan Comments:        Anesthesia Quick Evaluation

## 2019-12-17 NOTE — Interval H&P Note (Signed)
History and Physical Interval Note: The H and P was reviewed and updated. The patient was examined.  No changes were found after exam.  The surgical eye was marked.  12/17/2019 8:24 AM  Drue Flirt  has presented today for surgery, with the diagnosis of Nuclear sclerotic cataract - Right eye.  The various methods of treatment have been discussed with the patient and family. After consideration of risks, benefits and other options for treatment, the patient has consented to  Procedure(s) with comments: CATARACT EXTRACTION PHACO AND INTRAOCULAR LENS PLACEMENT (IOC) (Right) - right as a surgical intervention.  The patient's history has been reviewed, patient examined, no change in status, stable for surgery.  I have reviewed the patient's chart and labs.  Questions were answered to the patient's satisfaction.     Baruch Goldmann

## 2019-12-17 NOTE — Op Note (Signed)
Date of procedure: 12/17/19  Pre-operative diagnosis:  Visually significant combined form age-related cataract, Right Eye (H25.811)  Post-operative diagnosis:  Visually significant combined form age-related cataract, Right Eye (H25.811)  Procedure: Removal of cataract via phacoemulsification and insertion of intra-ocular lens [employer] [id number]  +24.5D into the capsular bag of the Right Eye  Attending surgeon: Gerda Diss. Arieon Scalzo, MD, MA  Anesthesia: MAC, Topical Akten  Complications: None  Estimated Blood Loss: <31m (minimal)  Specimens: None  Implants: As above  Indications:  Visually significant age-related cataract, Right Eye  Procedure:  The patient was seen and identified in the pre-operative area. The operative eye was identified and dilated.  The operative eye was marked.  Topical anesthesia was administered to the operative eye.     The patient was then to the operative suite and placed in the supine position.  A timeout was performed confirming the patient, procedure to be performed, and all other relevant information.   The patient's face was prepped and draped in the usual fashion for intra-ocular surgery.  A lid speculum was placed into the operative eye and the surgical microscope moved into place and focused.  A superotemporal paracentesis was created using a 20 gauge paracentesis blade.  Shugarcaine was injected into the anterior chamber.  Viscoelastic was injected into the anterior chamber.  A temporal clear-corneal main wound incision was created using a 2.472mmicrokeratome.  A continuous curvilinear capsulorrhexis was initiated using an irrigating cystitome and completed using capsulorrhexis forceps.  Hydrodissection and hydrodeliniation were performed.  Viscoelastic was injected into the anterior chamber.  A phacoemulsification handpiece and a chopper as a second instrument were used to remove the nucleus and epinucleus. The irrigation/aspiration handpiece was  used to remove any remaining cortical material.   The capsular bag was reinflated with viscoelastic, checked, and found to be intact.  The intraocular lens was inserted into the capsular bag.  The irrigation/aspiration handpiece was used to remove any remaining viscoelastic.  The clear corneal wound and paracentesis wounds were then hydrated and checked with Weck-Cels to be watertight.  The lid-speculum and drape was removed, and the patient's face was cleaned with a wet and dry 4x4. A clear shield was taped over the eye. The patient was taken to the post-operative care unit in good condition, having tolerated the procedure well.  Post-Op Instructions: [facility] and will receive all other orders and instructions.

## 2019-12-17 NOTE — Transfer of Care (Signed)
Immediate Anesthesia Transfer of Care Note  Patient: [name]  Procedure(s) Performed: CATARACT EXTRACTION PHACO AND INTRAOCULAR LENS PLACEMENT (IOC) (Right Eye)  Patient Location: PACU  Anesthesia Type:MAC  Level of Consciousness: awake  Airway & Oxygen Therapy: Patient Spontanous Breathing  Post-op Assessment: Report given to RN  Post vital signs: Reviewed  Last Vitals:  Vitals Value Taken Time  BP    Temp    Pulse    Resp    SpO2      Last Pain:  Vitals:   12/17/19 0737  TempSrc: Oral  PainSc: 4       Patients Stated Pain Goal: 7 (16/38/45 3646)  Complications: No apparent anesthesia complications

## 2019-12-17 NOTE — Anesthesia Postprocedure Evaluation (Signed)
Anesthesia Post Note  Patient: [name]  Procedure(s) Performed: CATARACT EXTRACTION PHACO AND INTRAOCULAR LENS PLACEMENT (IOC) (Right Eye)  Patient location during evaluation: Short Stay Anesthesia Type: MAC Level of consciousness: awake and alert and awake Pain management: pain level controlled Vital Signs Assessment: post-procedure vital signs reviewed and stable Respiratory status: spontaneous breathing Cardiovascular status: blood pressure returned to baseline and stable Postop Assessment: no apparent nausea or vomiting Anesthetic complications: no     Last Vitals:  Vitals:   12/17/19 0737  BP: 115/80  Pulse: 76  Resp: 18  Temp: 36.7 C  SpO2: 96%    Last Pain:  Vitals:   12/17/19 0737  TempSrc: Oral  PainSc: 4                  Merville Hijazi

## 2019-12-25 NOTE — H&P (Signed)
Surgical History & Physical  Patient Name: [name] DOB: July 18, 1961  Surgery: Cataract extraction with intraocular lens implant phacoemulsification; Left Eye  Surgeon: Baruch Goldmann MD Surgery Date:  12/31/2019 Pre-Op Date:  12/24/2019  HPI: A 35 Yr. old female patient 1. 1. The patient complains of difficulty when viewing TV, reading closed caption, news scrolls on TV, which began 6 months ago. The left eye is affected. The episode is gradual. The condition's severity increased since last visit. Symptoms occur when the patient is inside and outside. The complaint is associated with glare. Pt reports sensitivity to glare from headlights at night, pt states headlights look like "fire works". This is negatively affecting the patient's quality of life. 2. The patient is returning after cataract post-op. The right eye is affected. Status post cataract post-op, which began 1 week ago: Since the last visit, the affected area is doing well. The patient's vision is improved. Patient is following postop medication instructions. HPI was performed by Baruch Goldmann .  Medical History: Cataracts Glaucoma ADD, Depression Cancer  Review of Systems Negative Allergic/Immunologic Negative Cardiovascular Negative Constitutional Negative Ear, Nose, Mouth & Throat Negative Endocrine Negative Eyes Negative Gastrointestinal Negative Genitourinary Negative Hemotologic/Lymphatic Negative Integumentary Negative Musculoskeletal Negative Neurological Negative Psychiatry Negative Respiratory  Social   Current every day smoker  Medication Prednisolone-gatiflox-bromfenac,  Lexapro, Abilify, Nucynta, Propranolol, Ritalin, Melatonin,   Sx/Procedures Phaco c IOL OD-multi focal,  Left shoulder replacement, Bunion Removal right foot x 3, Meniscus removal right knee, Wisdom Teeth Removal, Ganglion cyst removal from wrist, Bilateral Mastectomy, Breast reconstruction,   Drug Allergies  Sulfa,    History & Physical: Heent:  Cataract, Left eye NECK: supple without bruits LUNGS: lungs clear to auscultation CV: regular rate and rhythm Abdomen: soft and non-tender  Impression & Plan: Assessment: 1.  CATARACT EXTRACTION STATUS; Right Eye (Z98.41) 2.  COMBINED FORMS AGE RELATED CATARACT; Left Eye (H25.812)  Plan: 1.  1 week after cataract surgery. Doing well with improved vision and normal eye pressure. Call with any problems or concerns. Continue Gati-Brom-Pred 2x/day for 3 more weeks. 2.  Cataract accounts for the patient's decreased vision. This visual impairment is not correctable with a tolerable change in glasses or contact lenses. Cataract surgery with an implantation of a new lens should significantly improve the visual and functional status of the patient. Discussed all risks, benefits, alternatives, and potential complications. Discussed the procedures and recovery. Patient desires to have surgery. A-scan ordered and performed today for intra-ocular lens calculations. The surgery will be performed in order to improve vision for driving, reading, and for eye examinations. Recommend phacoemulsification with intra-ocular lens. Left Eye Dilates well - shugarcaine by protocol. Symfony

## 2019-12-25 NOTE — H&P (View-Only) (Signed)
Surgical History & Physical  Patient Name: [name] DOB: December 07, 1960  Surgery: Cataract extraction with intraocular lens implant phacoemulsification; Left Eye  Surgeon: Baruch Goldmann MD Surgery Date:  12/31/2019 Pre-Op Date:  12/24/2019  HPI: A 38 Yr. old female patient 1. 1. The patient complains of difficulty when viewing TV, reading closed caption, news scrolls on TV, which began 6 months ago. The left eye is affected. The episode is gradual. The condition's severity increased since last visit. Symptoms occur when the patient is inside and outside. The complaint is associated with glare. Pt reports sensitivity to glare from headlights at night, pt states headlights look like "fire works". This is negatively affecting the patient's quality of life. 2. The patient is returning after cataract post-op. The right eye is affected. Status post cataract post-op, which began 1 week ago: Since the last visit, the affected area is doing well. The patient's vision is improved. Patient is following postop medication instructions. HPI was performed by Baruch Goldmann .  Medical History: Cataracts Glaucoma ADD, Depression Cancer  Review of Systems Negative Allergic/Immunologic Negative Cardiovascular Negative Constitutional Negative Ear, Nose, Mouth & Throat Negative Endocrine Negative Eyes Negative Gastrointestinal Negative Genitourinary Negative Hemotologic/Lymphatic Negative Integumentary Negative Musculoskeletal Negative Neurological Negative Psychiatry Negative Respiratory  Social   Current every day smoker  Medication Prednisolone-gatiflox-bromfenac,  Lexapro, Abilify, Nucynta, Propranolol, Ritalin, Melatonin,   Sx/Procedures Phaco c IOL OD-multi focal,  Left shoulder replacement, Bunion Removal right foot x 3, Meniscus removal right knee, Wisdom Teeth Removal, Ganglion cyst removal from wrist, Bilateral Mastectomy, Breast reconstruction,   Drug Allergies  Sulfa,    History & Physical: Heent:  Cataract, Left eye NECK: supple without bruits LUNGS: lungs clear to auscultation CV: regular rate and rhythm Abdomen: soft and non-tender  Impression & Plan: Assessment: 1.  CATARACT EXTRACTION STATUS; Right Eye (Z98.41) 2.  COMBINED FORMS AGE RELATED CATARACT; Left Eye (H25.812)  Plan: 1.  1 week after cataract surgery. Doing well with improved vision and normal eye pressure. Call with any problems or concerns. Continue Gati-Brom-Pred 2x/day for 3 more weeks. 2.  Cataract accounts for the patient's decreased vision. This visual impairment is not correctable with a tolerable change in glasses or contact lenses. Cataract surgery with an implantation of a new lens should significantly improve the visual and functional status of the patient. Discussed all risks, benefits, alternatives, and potential complications. Discussed the procedures and recovery. Patient desires to have surgery. A-scan ordered and performed today for intra-ocular lens calculations. The surgery will be performed in order to improve vision for driving, reading, and for eye examinations. Recommend phacoemulsification with intra-ocular lens. Left Eye Dilates well - shugarcaine by protocol. Symfony

## 2019-12-28 ENCOUNTER — Other Ambulatory Visit (HOSPITAL_COMMUNITY)
Admission: RE | Admit: 2019-12-28 | Discharge: 2019-12-28 | Disposition: A | Discharge status: Home / Self Care | Payer: 59 | Source: Ambulatory Visit | Attending: Ophthalmology | Admitting: Ophthalmology

## 2019-12-28 ENCOUNTER — Encounter (HOSPITAL_COMMUNITY): Payer: Self-pay

## 2019-12-28 ENCOUNTER — Other Ambulatory Visit: Payer: Self-pay

## 2019-12-28 ENCOUNTER — Encounter (HOSPITAL_COMMUNITY)
Admission: RE | Admit: 2019-12-28 | Discharge: 2019-12-28 | Disposition: A | Discharge status: Home / Self Care | Payer: 59 | Source: Ambulatory Visit | Attending: Ophthalmology | Admitting: Ophthalmology

## 2019-12-28 DIAGNOSIS — Z20822 Contact with and (suspected) exposure to covid-19: Secondary | ICD-10-CM | POA: Diagnosis not present

## 2019-12-28 DIAGNOSIS — Z01812 Encounter for preprocedural laboratory examination: Secondary | ICD-10-CM | POA: Insufficient documentation

## 2019-12-28 LAB — SARS CORONAVIRUS 2 (TAT 6-24 HRS): SARS Coronavirus 2: NEGATIVE

## 2019-12-31 ENCOUNTER — Encounter (HOSPITAL_COMMUNITY): Admission: RE | Payer: Self-pay | Source: Home / Self Care

## 2019-12-31 ENCOUNTER — Ambulatory Visit (HOSPITAL_COMMUNITY): Admission: RE | Admit: 2019-12-31 | Payer: 59 | Source: Home / Self Care | Admitting: Ophthalmology

## 2019-12-31 ENCOUNTER — Encounter (HOSPITAL_COMMUNITY): Payer: Self-pay | Admitting: Ophthalmology

## 2019-12-31 ENCOUNTER — Encounter (HOSPITAL_COMMUNITY)
Admission: RE | Disposition: A | Discharge status: Home / Self Care | Payer: Self-pay | Source: Ambulatory Visit | Attending: Ophthalmology

## 2019-12-31 ENCOUNTER — Other Ambulatory Visit: Payer: Self-pay

## 2019-12-31 ENCOUNTER — Ambulatory Visit (HOSPITAL_COMMUNITY)
Admission: RE | Admit: 2019-12-31 | Discharge: 2019-12-31 | Disposition: A | Discharge status: Home / Self Care | Payer: No Typology Code available for payment source | Source: Ambulatory Visit | Attending: Ophthalmology | Admitting: Ophthalmology

## 2019-12-31 ENCOUNTER — Ambulatory Visit (HOSPITAL_COMMUNITY): Payer: No Typology Code available for payment source | Admitting: Certified Registered Nurse Anesthetist

## 2019-12-31 DIAGNOSIS — M1990 Unspecified osteoarthritis, unspecified site: Secondary | ICD-10-CM | POA: Diagnosis not present

## 2019-12-31 DIAGNOSIS — H25812 Combined forms of age-related cataract, left eye: Secondary | ICD-10-CM | POA: Insufficient documentation

## 2019-12-31 DIAGNOSIS — E039 Hypothyroidism, unspecified: Secondary | ICD-10-CM | POA: Diagnosis not present

## 2019-12-31 DIAGNOSIS — R519 Headache, unspecified: Secondary | ICD-10-CM | POA: Diagnosis not present

## 2019-12-31 DIAGNOSIS — F329 Major depressive disorder, single episode, unspecified: Secondary | ICD-10-CM | POA: Diagnosis not present

## 2019-12-31 DIAGNOSIS — Z859 Personal history of malignant neoplasm, unspecified: Secondary | ICD-10-CM | POA: Insufficient documentation

## 2019-12-31 DIAGNOSIS — H409 Unspecified glaucoma: Secondary | ICD-10-CM | POA: Diagnosis not present

## 2019-12-31 DIAGNOSIS — Z96612 Presence of left artificial shoulder joint: Secondary | ICD-10-CM | POA: Insufficient documentation

## 2019-12-31 DIAGNOSIS — F419 Anxiety disorder, unspecified: Secondary | ICD-10-CM | POA: Insufficient documentation

## 2019-12-31 DIAGNOSIS — F1120 Opioid dependence, uncomplicated: Secondary | ICD-10-CM | POA: Diagnosis not present

## 2019-12-31 DIAGNOSIS — Z79899 Other long term (current) drug therapy: Secondary | ICD-10-CM | POA: Diagnosis not present

## 2019-12-31 DIAGNOSIS — M797 Fibromyalgia: Secondary | ICD-10-CM | POA: Insufficient documentation

## 2019-12-31 DIAGNOSIS — G4730 Sleep apnea, unspecified: Secondary | ICD-10-CM | POA: Diagnosis not present

## 2019-12-31 DIAGNOSIS — F17200 Nicotine dependence, unspecified, uncomplicated: Secondary | ICD-10-CM | POA: Insufficient documentation

## 2019-12-31 DIAGNOSIS — F988 Other specified behavioral and emotional disorders with onset usually occurring in childhood and adolescence: Secondary | ICD-10-CM | POA: Insufficient documentation

## 2019-12-31 DIAGNOSIS — Z9013 Acquired absence of bilateral breasts and nipples: Secondary | ICD-10-CM | POA: Insufficient documentation

## 2019-12-31 HISTORY — PX: CATARACT EXTRACTION W/PHACO: SHX586

## 2019-12-31 SURGERY — PHACOEMULSIFICATION, CATARACT, WITH IOL INSERTION
Anesthesia: Monitor Anesthesia Care | Site: Eye | Laterality: Left

## 2019-12-31 SURGERY — PHACOEMULSIFICATION, CATARACT, WITH IOL INSERTION
Anesthesia: Monitor Anesthesia Care | Laterality: Left

## 2019-12-31 MED ORDER — MIDAZOLAM HCL 2 MG/2ML IJ SOLN: 1.0000 mg | Freq: Once | INTRAMUSCULAR | Status: DC

## 2019-12-31 MED ORDER — TETRACAINE HCL 0.5 % OP SOLN
1.0000 [drp] | OPHTHALMIC | Status: AC | PRN
  Administered 2019-12-31 (×3): 1 [drp] via OPHTHALMIC

## 2019-12-31 MED ORDER — BSS IO SOLN
INTRAOCULAR | Status: DC | PRN
  Administered 2019-12-31: 15 mL via INTRAOCULAR

## 2019-12-31 MED ORDER — HYDROCODONE-ACETAMINOPHEN 7.5-325 MG PO TABS: 1.0000 | ORAL_TABLET | Freq: Once | ORAL | Status: DC | PRN

## 2019-12-31 MED ORDER — MIDAZOLAM HCL 5 MG/5ML IJ SOLN
INTRAMUSCULAR | Status: DC | PRN
  Administered 2019-12-31: 2 mg via INTRAVENOUS

## 2019-12-31 MED ORDER — PROVISC 10 MG/ML IO SOLN
INTRAOCULAR | Status: DC | PRN
  Administered 2019-12-31: 0.85 mL via INTRAOCULAR

## 2019-12-31 MED ORDER — CYCLOPENTOLATE-PHENYLEPHRINE 0.2-1 % OP SOLN
1.0000 [drp] | OPHTHALMIC | Status: AC | PRN
  Administered 2019-12-31 (×3): 1 [drp] via OPHTHALMIC

## 2019-12-31 MED ORDER — PROMETHAZINE HCL 25 MG/ML IJ SOLN: 6.2500 mg | INTRAMUSCULAR | Status: DC | PRN

## 2019-12-31 MED ORDER — EPINEPHRINE PF 1 MG/ML IJ SOLN
INTRAOCULAR | Status: DC | PRN
  Administered 2019-12-31: 500 mL

## 2019-12-31 MED ORDER — LIDOCAINE HCL 3.5 % OP GEL
1.0000 "application " | Freq: Once | OPHTHALMIC | Status: AC
  Administered 2019-12-31: 1 via OPHTHALMIC

## 2019-12-31 MED ORDER — MIDAZOLAM HCL 2 MG/2ML IJ SOLN
INTRAMUSCULAR | Status: AC
  Filled 2019-12-31: qty 2

## 2019-12-31 MED ORDER — POVIDONE-IODINE 5 % OP SOLN
OPHTHALMIC | Status: DC | PRN
  Administered 2019-12-31: 1 via OPHTHALMIC

## 2019-12-31 MED ORDER — HYDROMORPHONE HCL 1 MG/ML IJ SOLN: 0.2500 mg | INTRAMUSCULAR | Status: DC | PRN

## 2019-12-31 MED ORDER — LACTATED RINGERS IV SOLN: INTRAVENOUS | Status: DC

## 2019-12-31 MED ORDER — MIDAZOLAM HCL 2 MG/2ML IJ SOLN: 0.5000 mg | Freq: Once | INTRAMUSCULAR | Status: DC | PRN

## 2019-12-31 MED ORDER — SODIUM HYALURONATE 23 MG/ML IO SOLN
INTRAOCULAR | Status: DC | PRN
  Administered 2019-12-31: 0.6 mL via INTRAOCULAR

## 2019-12-31 MED ORDER — PHENYLEPHRINE HCL 2.5 % OP SOLN
1.0000 [drp] | OPHTHALMIC | Status: AC | PRN
  Administered 2019-12-31 (×3): 1 [drp] via OPHTHALMIC

## 2019-12-31 MED ORDER — LIDOCAINE HCL (PF) 1 % IJ SOLN
INTRAOCULAR | Status: DC | PRN
  Administered 2019-12-31: 1 mL via OPHTHALMIC

## 2019-12-31 SURGICAL SUPPLY — 15 items
CLOTH BEACON ORANGE TIMEOUT ST (SAFETY) ×2 IMPLANT
EYE SHIELD UNIVERSAL CLEAR (GAUZE/BANDAGES/DRESSINGS) ×2 IMPLANT
GLOVE BIOGEL PI IND STRL 7.0 (GLOVE) IMPLANT
GLOVE BIOGEL PI INDICATOR 7.0 (GLOVE) ×4
LENS IOL SYMFONY 25.0 ×2 IMPLANT
NDL HYPO 18GX1.5 BLUNT FILL (NEEDLE) IMPLANT
NEEDLE HYPO 18GX1.5 BLUNT FILL (NEEDLE) ×3 IMPLANT
PAD ARMBOARD 7.5X6 YLW CONV (MISCELLANEOUS) ×2 IMPLANT
PROC W SPEC LENS (INTRAOCULAR LENS) ×3
PROCESS W SPEC LENS (INTRAOCULAR LENS) IMPLANT
SYR TB 1ML LL NO SAFETY (SYRINGE) ×2 IMPLANT
TAPE SURG TRANSPORE 1 IN (GAUZE/BANDAGES/DRESSINGS) IMPLANT
TAPE SURGICAL TRANSPORE 1 IN (GAUZE/BANDAGES/DRESSINGS) ×2
VISCOELASTIC ADDITIONAL (OPHTHALMIC RELATED) ×2 IMPLANT
WATER STERILE IRR 250ML POUR (IV SOLUTION) ×2 IMPLANT

## 2019-12-31 NOTE — Discharge Instructions (Signed)
Please discharge patient when stable, will follow up today with Dr. Kaedyn Polivka at the [facility] office immediately following discharge.  Leave shield in place until visit.  All paperwork with discharge instructions will be given at the office.  [facility] Address:  [address]  [place], [place] [address]             Monitored Anesthesia Care, Care After These instructions provide you with information about caring for yourself after your procedure. Your health care provider may also give you more specific instructions. Your treatment has been planned according to current medical practices, but problems sometimes occur. Call your health care provider if you have any problems or questions after your procedure. What can I expect after the procedure? After your procedure, you may:  Feel sleepy for several hours.  Feel clumsy and have poor balance for several hours.  Feel forgetful about what happened after the procedure.  Have poor judgment for several hours.  Feel nauseous or vomit.  Have a sore throat if you had a breathing tube during the procedure. Follow these instructions at home: For at least 24 hours after the procedure:      Have a responsible adult stay with you. It is important to have someone help care for you until you are awake and alert.  Rest as needed.  Do not: ? Participate in activities in which you could fall or become injured. ? Drive. ? Use heavy machinery. ? Drink alcohol. ? Take sleeping pills or medicines that cause drowsiness. ? Make important decisions or sign legal documents. ? Take care of children on your own. Eating and drinking  Follow the diet that is recommended by your health care provider.  If you vomit, drink water, juice, or soup when you can drink without vomiting.  Make sure you have little or no nausea before eating solid foods. General instructions  Take over-the-counter and  prescription medicines only as told by your health care provider.  If you have sleep apnea, surgery and certain medicines can increase your risk for breathing problems. Follow instructions from your health care provider about wearing your sleep device: ? Anytime you are sleeping, including during daytime naps. ? While taking prescription pain medicines, sleeping medicines, or medicines that make you drowsy.  If you smoke, do not smoke without supervision.  Keep all follow-up visits as told by your health care provider. This is important. Contact a health care provider if:  You keep feeling nauseous or you keep vomiting.  You feel light-headed.  You develop a rash.  You have a fever. Get help right away if:  You have trouble breathing. Summary  For several hours after your procedure, you may feel sleepy and have poor judgment.  Have a responsible adult stay with you for at least 24 hours or until you are awake and alert. This information is not intended to replace advice given to you by your health care provider. Make sure you discuss any questions you have with your health care provider. Document Revised: 01/23/2018 Document Reviewed: 02/15/2016 Elsevier Patient Education  2020 [employer].  

## 2019-12-31 NOTE — Anesthesia Postprocedure Evaluation (Signed)
Anesthesia Post Note  Patient: [name]  Procedure(s) Performed: CATARACT EXTRACTION PHACO AND INTRAOCULAR LENS PLACEMENT (IOC) (Left Eye)  Patient location during evaluation: PACU Anesthesia Type: MAC Level of consciousness: awake and alert Pain management: pain level controlled Vital Signs Assessment: post-procedure vital signs reviewed and stable Respiratory status: spontaneous breathing, nonlabored ventilation, respiratory function stable and patient connected to nasal cannula oxygen Cardiovascular status: stable and blood pressure returned to baseline Postop Assessment: no apparent nausea or vomiting Anesthetic complications: no     Last Vitals:  Vitals:   12/31/19 1131 12/31/19 1211  BP: (!) 154/84 121/79  Pulse:  74  Resp:  18  Temp:  (!) 36.3 C  SpO2: 97% 95%    Last Pain:  Vitals:   12/31/19 1211  TempSrc: Oral  PainSc:                  Talitha Givens

## 2019-12-31 NOTE — Op Note (Signed)
Date of procedure: 12/31/19  Pre-operative diagnosis: Visually significant age-related combined cataract, Left Eye (H25.812)  Post-operative diagnosis: Visually significant age-related combined cataract, Left Eye (H25.812)  Procedure: Removal of cataract via phacoemulsification and insertion of intra-ocular lens [employer] ZXR00  +25.0D into the capsular bag of the Left Eye  Attending surgeon: Gerda Diss. Inell Mimbs, MD, MA  Anesthesia: MAC, Topical Akten  Complications: None  Estimated Blood Loss: <53m (minimal)  Specimens: None  Implants: As above  Indications:  Visually significant age-related cataract, Left Eye  Procedure:  The patient was seen and identified in the pre-operative area. The operative eye was identified and dilated.  The operative eye was marked.  Topical anesthesia was administered to the operative eye.     The patient was then to the operative suite and placed in the supine position.  A timeout was performed confirming the patient, procedure to be performed, and all other relevant information.   The patient's face was prepped and draped in the usual fashion for intra-ocular surgery.  A lid speculum was placed into the operative eye and the surgical microscope moved into place and focused.  An inferotemporal paracentesis was created using a 20 gauge paracentesis blade.  Shugarcaine was injected into the anterior chamber.  Viscoelastic was injected into the anterior chamber.  A temporal clear-corneal main wound incision was created using a 2.458mmicrokeratome.  A continuous curvilinear capsulorrhexis was initiated using an irrigating cystitome and completed using capsulorrhexis forceps.  Hydrodissection and hydrodeliniation were performed.  Viscoelastic was injected into the anterior chamber.  A phacoemulsification handpiece and a chopper as a second instrument were used to remove the nucleus and epinucleus. The irrigation/aspiration handpiece was used to remove  any remaining cortical material.   The capsular bag was reinflated with viscoelastic, checked, and found to be intact.  The intraocular lens was inserted into the capsular bag.  The irrigation/aspiration handpiece was used to remove any remaining viscoelastic.  The clear corneal wound and paracentesis wounds were then hydrated and checked with Weck-Cels to be watertight.  The lid-speculum and drape was removed, and the patient's face was cleaned with a wet and dry 4x4.  A clear shield was taped over the eye. The patient was taken to the post-operative care unit in good condition, having tolerated the procedure well.  Post-Op Instructions: [facility] and will receive all other orders and instructions.

## 2019-12-31 NOTE — Transfer of Care (Signed)
Immediate Anesthesia Transfer of Care Note  Patient: [name]  Procedure(s) Performed: CATARACT EXTRACTION PHACO AND INTRAOCULAR LENS PLACEMENT (IOC) (Left Eye)  Patient Location: PACU  Anesthesia Type:MAC  Level of Consciousness: awake, alert  and oriented  Airway & Oxygen Therapy: Patient Spontanous Breathing  Post-op Assessment: Report given to RN and Patient moving all extremities X 4  Post vital signs: Reviewed and stable  Last Vitals:  Vitals Value Taken Time  BP    Temp    Pulse    Resp    SpO2      Last Pain:  Vitals:   12/31/19 1058  TempSrc: Oral  PainSc: 0-No pain      Patients Stated Pain Goal: 5 (12/31/34 1224)  Complications: No apparent anesthesia complications

## 2019-12-31 NOTE — Anesthesia Preprocedure Evaluation (Signed)
Anesthesia Evaluation  Patient identified by MRN, date of birth, ID band Patient awake    Reviewed: Allergy & Precautions, NPO status , Patient's Chart, lab work & pertinent test results, reviewed documented beta blocker date and time   History of Anesthesia Complications (+) PONV and history of anesthetic complications  Airway Mallampati: II  TM Distance: >3 FB Neck ROM: Full    Dental no notable dental hx. (+) Teeth Intact   Pulmonary sleep apnea and Continuous Positive Airway Pressure Ventilation , Current SmokerPatient did not abstain from smoking.,    Pulmonary exam normal breath sounds clear to auscultation       Cardiovascular Exercise Tolerance: Poor negative cardio ROS Normal cardiovascular examII Rhythm:Regular Rate:Normal  Reports murmer States ET limited due to joint issues  In pain management on nucynta On inderal -denies recent CP   Neuro/Psych  Headaches, PSYCHIATRIC DISORDERS Anxiety Depression  Neuromuscular disease    GI/Hepatic negative GI ROS, Neg liver ROS,   Endo/Other  Hypothyroidism   Renal/GU negative Renal ROS  negative genitourinary   Musculoskeletal  (+) Arthritis , Osteoarthritis,  Fibromyalgia -, narcotic dependent  Abdominal   Peds negative pediatric ROS (+)  Hematology negative hematology ROS (+)   Anesthesia Other Findings   Reproductive/Obstetrics negative OB ROS                             Anesthesia Physical Anesthesia Plan  ASA: III  Anesthesia Plan: MAC   Post-op Pain Management:    Induction: Intravenous  PONV Risk Score and Plan: 3 and TIVA, Midazolam, Ondansetron and Treatment may vary due to age or medical condition  Airway Management Planned: Simple Face Mask and Nasal Cannula  Additional Equipment:   Intra-op Plan:   Post-operative Plan:   Informed Consent: I have reviewed the patients History and Physical, chart, labs and  discussed the procedure including the risks, benefits and alternatives for the proposed anesthesia with the patient or authorized representative who has indicated his/her understanding and acceptance.     Dental advisory given  Plan Discussed with: CRNA  Anesthesia Plan Comments: (Plan Full PPE use  Plan MAC d/w pt -WTP with same after Q&A  Done 2/8/20201- did well -wants similar as last time)        Anesthesia Quick Evaluation

## 2019-12-31 NOTE — Interval H&P Note (Signed)
History and Physical Interval Note: The H and P was reviewed and updated. The patient was examined.  No changes were found after exam.  The surgical eye was marked.  12/31/2019 11:12 AM  [name]  has presented today for surgery, with the diagnosis of nuclear cataract left eye.  The various methods of treatment have been discussed with the patient and family. After consideration of risks, benefits and other options for treatment, the patient has consented to  Procedure(s) with comments: CATARACT EXTRACTION PHACO AND INTRAOCULAR LENS PLACEMENT ([place]) (Left) - left as a surgical intervention.  The patient's history has been reviewed, patient examined, no change in status, stable for surgery.  I have reviewed the patient's chart and labs.  Questions were answered to the patient's satisfaction.     Baruch Goldmann

## 2020-04-24 ENCOUNTER — Other Ambulatory Visit: Payer: Self-pay | Admitting: Anesthesiology

## 2020-04-24 ENCOUNTER — Other Ambulatory Visit (HOSPITAL_COMMUNITY): Payer: Self-pay | Admitting: Anesthesiology

## 2020-04-24 DIAGNOSIS — M545 Low back pain, unspecified: Secondary | ICD-10-CM

## 2020-04-30 ENCOUNTER — Ambulatory Visit (HOSPITAL_COMMUNITY): Payer: No Typology Code available for payment source

## 2020-04-30 ENCOUNTER — Encounter (HOSPITAL_COMMUNITY): Payer: Self-pay

## 2020-05-01 ENCOUNTER — Ambulatory Visit: Payer: No Typology Code available for payment source | Admitting: Orthopaedic Surgery

## 2020-05-03 ENCOUNTER — Ambulatory Visit (HOSPITAL_COMMUNITY)
Admission: RE | Admit: 2020-05-03 | Discharge: 2020-05-03 | Disposition: A | Discharge status: Home / Self Care | Payer: No Typology Code available for payment source | Source: Ambulatory Visit | Attending: Anesthesiology | Admitting: Anesthesiology

## 2020-05-03 ENCOUNTER — Other Ambulatory Visit: Payer: Self-pay

## 2020-05-03 DIAGNOSIS — M545 Low back pain, unspecified: Secondary | ICD-10-CM

## 2020-05-08 ENCOUNTER — Ambulatory Visit: Payer: No Typology Code available for payment source | Admitting: Orthopaedic Surgery

## 2020-05-08 ENCOUNTER — Other Ambulatory Visit: Payer: Self-pay

## 2020-05-14 ENCOUNTER — Ambulatory Visit: Payer: 59 | Admitting: Orthopaedic Surgery

## 2020-12-31 NOTE — Progress Notes (Signed)
Office Visit Note  Patient: [name]             Date of Birth: [birth year]           MRN: [id number]             PCP: Ginger Organ., MD Referring: Nicholaus Bloom, MD Visit Date: 01/14/2021 Occupation: @GUAROCC @  Subjective:  Pain in multiple joints.   History of Present Illness: [name] is a 60 y.o. female seen in consultation per request of her PCP.  According to the patient she was diagnosed with fibromyalgia about 30 years ago.  Her fibromyalgia is fairly well controlled on the current medications.  Over time she has developed pain and discomfort in multiple joints.  She states she underwent left shoulder joint replacement by Dr. Tamera Punt few years back and has good range of motion in her shoulder.  She continues to have pain and discomfort in her bilateral shoulders.  She also had right knee joint meniscal tear repair about 3 years ago and has severe osteoarthritis now.  She has been followed by Dr. Lyla Glassing at [facility].  She states she had Visco supplement injections about 2 days ago.  She will need total knee replacement in the future.  She states for the last 2 years she has been having progressively increasing pain in her hands.  She is has difficulty making a fist.  Her hands are very stiff in the morning.  She states after prolonged sitting all of her joints become stiff.  She states she has had problems with her feet for many years.  She had bunionectomy on her right foot by Dr. Paulla Dolly few years back.  She still have discomfort in her both feet.  She denies any discomfort in her cervical spine but have some lower back pain off and on.  She states her mother had osteoarthritis.  She is gravida 3 and para 3.  There is no history of oral ulcers, nasal ulcers, malar rash, photosensitivity, Raynaud's phenomenon or lymphadenopathy.  Activities of Daily Living:  Patient reports morning stiffness for 1  hour.   Patient Denies nocturnal pain.  Difficulty  dressing/grooming: Denies Difficulty climbing stairs: Reports Difficulty getting out of chair: Reports Difficulty using hands for taps, buttons, cutlery, and/or writing: Reports  Review of Systems  Constitutional: Positive for fatigue.  HENT: Positive for mouth dryness. Negative for mouth sores and nose dryness.   Eyes: Negative for pain, itching and dryness.  Respiratory: Negative for shortness of breath and difficulty breathing.   Cardiovascular: Negative for chest pain and palpitations.  Gastrointestinal: Positive for constipation. Negative for blood in stool and diarrhea.  Endocrine: Negative for increased urination.  Genitourinary: Negative for difficulty urinating.  Musculoskeletal: Positive for arthralgias, joint pain, joint swelling, myalgias, morning stiffness, muscle tenderness and myalgias.  Skin: Negative for color change, rash, hair loss and redness.  Allergic/Immunologic: Negative for susceptible to infections.  Neurological: Positive for memory loss and weakness. Negative for dizziness, numbness and headaches.  Hematological: Negative for bruising/bleeding tendency.  Psychiatric/Behavioral: Negative for confusion.    PMFS History:  Patient Active Problem List   Diagnosis Date Noted  . Osteoarthritis of left shoulder 01/13/2017  . S/P shoulder replacement, left 01/13/2017  . OSA on CPAP 04/01/2015  . Subacute confusional state 04/01/2015  . Hallucinations, visual 04/01/2015  . Hypnapompic hallucinations 04/01/2015  . Sleep apnea with use of continuous positive airway pressure (CPAP)   . Hypersomnia, persistent 03/30/2013  . Obstructive  sleep apnea (adult) (pediatric) 03/30/2013  . CONSTIPATION 11/18/2008  . RECTAL BLEEDING 11/18/2008  . HEMORRHOIDS, INTERNAL 11/15/2008  . DIVERTICULOSIS, COLON 11/15/2008  . CELLULITIS/ABSCESS, TRUNK 02/22/2008  . LOW BACK PAIN 02/22/2008  . DIZZINESS 02/22/2008  . FATIGUE 02/22/2008  . PALPITATIONS 02/22/2008  . MURMUR,  CARDIAC, UNDIAGNOSED 02/22/2008  . BREAST CANCER, HX OF 02/22/2008  . COUGH 01/05/2008  . DEPRESSION 07/05/2007  . UTERINE POLYP 07/05/2007  . FIBROMYALGIA 07/05/2007  . COLONIC POLYPS, HX OF 07/05/2007  . HX, PERSONAL, CERVICAL DYSPLASIA 07/05/2007    Past Medical History:  Diagnosis Date  . Anxiety   . Arthritis   . Breast cancer ([place])    rt breast  and left breast breast removed  . Breast reconstruction deformity   . Complication of anesthesia    shaking-after mastectomies-resp and hr dropped  . Depression, major    suicidal thoughts -implants, mental health, 05/11  . Fibromyalgia   . Headache(784.0)   . Hypersomnia    sleep attacks  . Hypothyroidism   . Migraine headache   . Narcolepsy   . Neoplasm    malignant breast, history of status post bilateral  . Post-operative nausea and vomiting   . Sleep apnea    CPAP  . Sleep apnea with use of continuous positive airway pressure (CPAP)    AHI 20 in HST, titration to 6 cm water - residual AHi 6 . remaining hypersomnic.     Family History  Problem Relation Age of Onset  . Breast cancer Mother   . Prostate cancer Father   . Breast cancer Maternal Aunt        breast  . Colon cancer Maternal Grandmother        age unknown, ? 39's  . Breast cancer Maternal Grandmother   . Colon cancer Cousin   . Healthy Brother   . Healthy Son   . Healthy Daughter   . Healthy Daughter   . Leukemia Granddaughter    Past Surgical History:  Procedure Laterality Date  . ARTHRODESIS METATARSALPHALANGEAL JOINT (MTPJ) Right 07/28/2017   Procedure: ARTHRODESIS  RIGHT HALLUX METATARSALPHALANGEAL JOINT (MTPJ);  Surgeon: Wylene Simmer, MD;  Location: [facility];  Service: Orthopedics;  Laterality: Right;  . Breast reconstructioin  7/09   flaps-bilat mastectomies  . BTL    . CARPAL TUNNEL RELEASE  2011   both hands  . CARPOMETACARPEL SUSPENSION PLASTY Right 08/29/2014   Procedure: REMOVAL DERMASPAN SUSPENSIONPLASTY RIGHT THUMB  ABDUCTOR POLLIS TRANSFER;  Surgeon: Daryll Brod, MD;  Location: [place];  Service: Orthopedics;  Laterality: Right;  . CATARACT EXTRACTION W/PHACO Right 12/17/2019   Procedure: CATARACT EXTRACTION PHACO AND INTRAOCULAR LENS PLACEMENT (IOC);  Surgeon: Baruch Goldmann, MD;  Location: [facility];  Service: Ophthalmology;  Laterality: Right;  CDE: 6.54  . CATARACT EXTRACTION W/PHACO Left 12/31/2019   Procedure: CATARACT EXTRACTION PHACO AND INTRAOCULAR LENS PLACEMENT (IOC);  Surgeon: Baruch Goldmann, MD;  Location: [facility];  Service: Ophthalmology;  Laterality: Left;  CDE: 3.66  . COLONOSCOPY W/ POLYPECTOMY  4/03   tubulovillous adenoma  . FINGER ARTHROSCOPY WITH CARPOMETACARPEL [facility]) ARTHROPLASTY Right 06/20/2014   Procedure: ARTHROSCOPY RIGHT THUMB CARPOMETACARPEL GRAFT JACKET INTERPOSITION PARTIAL TRAPEZIECTOMY POSSIBLE OPEN;  Surgeon: Daryll Brod, MD;  Location: [place];  Service: Orthopedics;  Laterality: Right;  . GANGLION CYST EXCISION     both [id number]  . HAMMER TOE SURGERY Right 07/28/2017   Procedure: THIRD HAMMERTOE CORRECTION;  Surgeon: Wylene Simmer, MD;  Location: [facility];  Service: Orthopedics;  Laterality: Right;  . HARDWARE REMOVAL Right 07/28/2017   Procedure: REMOVAL OF DEEP IMPLANTS;  Surgeon: Wylene Simmer, MD;  Location: [place];  Service: Orthopedics;  Laterality: Right;  . hysterestomy  2005  . MASTECTOMY Bilateral 2008  . post- a -cath  2008  . right knee arthroscopy    . TOTAL SHOULDER ARTHROPLASTY Left 01/13/2017   Procedure: LEFT TOTAL SHOULDER ARTHROPLASTY;  Surgeon: Tania Ade, MD;  Location: [place];  Service: Orthopedics;  Laterality: Left;  LEFT TOTAL SHOULDER ARTHROPLASTY  . TRIGGER FINGER RELEASE Right 08/29/2014   Procedure: RELEASE TRIGGER FINGER/A-1 PULLEY;  Surgeon: Daryll Brod, MD;  Location: [place];  Service: Orthopedics;  Laterality: Right;  . wisdom teeth out     Social History    Social History Narrative   Patient  Is married Leory Plowman) and lives at home with her husband.   Patient has 3 children.    Patient is disabled.    Patient has a college education.    Patient is right-handed.   Patient drinks one cup of coffee daily and one coke per day.   Immunization History  Administered Date(s) Administered  . Moderna Sars-Covid-2 Vaccination 01/23/2020, 02/22/2020, 11/06/2020     Objective: Vital Signs: BP 92/69 (BP Location: Left Arm, Patient Position: Sitting, Cuff Size: Normal)   Pulse 92   Resp 16   Ht 5' 3.75" (1.619 m)   Wt 174 lb (78.9 kg)   BMI 30.10 kg/m    Physical Exam Vitals and nursing note reviewed.  Constitutional:      Appearance: She is well-developed and well-nourished.  HENT:     Head: Normocephalic and atraumatic.  Eyes:     Extraocular Movements: EOM normal.     Conjunctiva/sclera: Conjunctivae normal.  Cardiovascular:     Rate and Rhythm: Normal rate and regular rhythm.     Pulses: Intact distal pulses.     Heart sounds: Normal heart sounds.  Pulmonary:     Effort: Pulmonary effort is normal.     Breath sounds: Normal breath sounds.  Abdominal:     General: Bowel sounds are normal.     Palpations: Abdomen is soft.  Musculoskeletal:     Cervical back: Normal range of motion.  Lymphadenopathy:     Cervical: No cervical adenopathy.  Skin:    General: Skin is warm and dry.     Capillary Refill: Capillary refill takes less than 2 seconds.  Neurological:     Mental Status: She is alert and oriented to person, place, and time.  Psychiatric:        Mood and Affect: Mood and affect normal.        Behavior: Behavior normal.      Musculoskeletal Exam: Some limitation in the C-spine with right lateral rotation without discomfort.  Lumbar spine was in fairly good range of motion without any point tenderness.  Shoulder joints were in good range of motion with some discomfort.  Left shoulder joint is replaced.  Elbow joints with  good range of motion.  She has bilateral CMC prominence, PIP and DIP thickening with no synovitis.  Hip joints with good range of motion.  Right knee joint had limited extension without any warmth.  Left knee joint was in good range of motion.  There was no tenderness over ankles MTPs or PIPs.  CDAI Exam: CDAI Score: - Patient Global: -; Provider Global: - Swollen: -; Tender: - Joint Exam 01/14/2021  No joint exam has been documented for this visit   There is currently no information documented on the homunculus. Go to the Rheumatology activity and complete the homunculus joint exam.  Investigation: No additional findings.  Imaging: No results found.  Recent Labs: Lab Results  Component Value Date   WBC 11.6 (H) 01/14/2017   HGB 10.7 (L) 01/14/2017   PLT 208 01/14/2017   NA 141 01/14/2017   K 3.5 01/14/2017   CL 107 01/14/2017   CO2 27 01/14/2017   GLUCOSE 152 (H) 01/14/2017   BUN 7 01/14/2017   CREATININE 0.73 01/14/2017   BILITOT 0.4 01/07/2017   ALKPHOS 72 01/07/2017   AST 26 01/07/2017   ALT 25 01/07/2017   PROT 6.8 01/07/2017   ALBUMIN 4.1 01/07/2017   CALCIUM 8.4 (L) 01/14/2017   GFRAA >60 01/14/2017    Speciality Comments: No specialty comments available.  Procedures:  No procedures performed Allergies: Sulfonamide derivatives   Assessment / Plan:     Visit Diagnoses: Polyarthralgia -patient complains of pain and discomfort in multiple joints including her shoulders, hands, knees, feet.  She has not witnessed any joint swelling but has noticed deformities in her hands.  Pain in both hands -she is pain and discomfort in her bilateral hands.  DIP and PIP prominence was noted.  No synovitis was noted.  X-ray findings are consistent with severe osteoarthritis.  Plan: XR Hand 2 View Right, XR Hand 2 View Left, x-ray findings were discussed with the patient.  I will obtain some additional labs per her request.  Sedimentation rate, Rheumatoid factor, Uric acid,  Cyclic citrul peptide antibody, IgG.  Joint protection muscle strengthening was discussed.  A handout on hand strengthening exercises was given and a list of natural anti-inflammatories was given.  S/P shoulder replacement, left - 01/2017 by Dr. Tamera Punt.  She continues to have some shoulder joint discomfort.  She had good range of motion of her shoulder.  Primary osteoarthritis of both knees-patient gives history of osteoarthritis of both knees.  Her right knee joint has limited extension and discomfort with range of motion.  She has been seeing Dr. Lyla Glassing and had recent Visco supplement injections.  I do not have x-rays to review.  Pes planus of both feet-she has seen Dr. Paulla Dolly several years ago and had surgery on her right foot.  She has limited range of motion of her right first and second MTP joints.  No synovitis was noted.  She had bilateral pes planus.  I have advised her to schedule an appointment with Dr. Paulla Dolly for orthotics.  Poor balance-she also complains of poor balance.  I believe is due to her feet and her knee joint.  She may benefit from lower extremity muscle strengthening exercises.  Fibromyalgia - Managed by [facility].  Patient states between Nucynta and Lyrica her pain symptoms are quite well managed.  History of diverticulosis  History of breast cancer - 2006,Bilateral mastectomy, CTX and RTX.  OSA on CPAP-patient also gives history of narcolepsy.  History of depression  Hx of colonic polyps  She is fully immunized against COVID-19 and also received a booster.  Orders: Orders Placed This Encounter  Procedures  . XR Hand 2 View Right  . XR Hand 2 View Left  . Sedimentation rate  . Rheumatoid factor  . Uric acid  . Cyclic citrul peptide antibody, IgG   No orders of the defined types were placed in this encounter.     Follow-Up Instructions: Return for  Pain in multiple joints.   Bo Merino, MD  Note - This record has been created  using Editor, commissioning.  Chart creation errors have been sought, but may not always  have been located. Such creation errors do not reflect on  the standard of medical care.

## 2021-01-14 ENCOUNTER — Ambulatory Visit: Payer: No Typology Code available for payment source | Admitting: Rheumatology

## 2021-01-14 ENCOUNTER — Encounter: Payer: Self-pay | Admitting: Rheumatology

## 2021-01-14 ENCOUNTER — Ambulatory Visit: Payer: Self-pay

## 2021-01-14 ENCOUNTER — Other Ambulatory Visit: Payer: Self-pay

## 2021-01-14 VITALS — BP 92/69 | HR 92 | Resp 16 | Ht 63.75 in | Wt 174.0 lb

## 2021-01-14 DIAGNOSIS — M170 Bilateral primary osteoarthritis of knee: Secondary | ICD-10-CM

## 2021-01-14 DIAGNOSIS — M2550 Pain in unspecified joint: Secondary | ICD-10-CM | POA: Diagnosis not present

## 2021-01-14 DIAGNOSIS — M79641 Pain in right hand: Secondary | ICD-10-CM

## 2021-01-14 DIAGNOSIS — Z9989 Dependence on other enabling machines and devices: Secondary | ICD-10-CM

## 2021-01-14 DIAGNOSIS — Z96612 Presence of left artificial shoulder joint: Secondary | ICD-10-CM

## 2021-01-14 DIAGNOSIS — M79642 Pain in left hand: Secondary | ICD-10-CM | POA: Diagnosis not present

## 2021-01-14 DIAGNOSIS — Z853 Personal history of malignant neoplasm of breast: Secondary | ICD-10-CM

## 2021-01-14 DIAGNOSIS — R2689 Other abnormalities of gait and mobility: Secondary | ICD-10-CM

## 2021-01-14 DIAGNOSIS — M797 Fibromyalgia: Secondary | ICD-10-CM

## 2021-01-14 DIAGNOSIS — R002 Palpitations: Secondary | ICD-10-CM

## 2021-01-14 DIAGNOSIS — Z8719 Personal history of other diseases of the digestive system: Secondary | ICD-10-CM

## 2021-01-14 DIAGNOSIS — Z8659 Personal history of other mental and behavioral disorders: Secondary | ICD-10-CM

## 2021-01-14 DIAGNOSIS — Z860100 Personal history of colon polyps, unspecified: Secondary | ICD-10-CM

## 2021-01-14 DIAGNOSIS — M2142 Flat foot [pes planus] (acquired), left foot: Secondary | ICD-10-CM

## 2021-01-14 DIAGNOSIS — G4733 Obstructive sleep apnea (adult) (pediatric): Secondary | ICD-10-CM

## 2021-01-14 DIAGNOSIS — M2141 Flat foot [pes planus] (acquired), right foot: Secondary | ICD-10-CM

## 2021-01-14 NOTE — Patient Instructions (Signed)
Hand Exercises Hand exercises can be helpful for almost anyone. These exercises can strengthen the hands, improve flexibility and movement, and increase blood flow to the hands. These results can make work and daily tasks easier. Hand exercises can be especially helpful for people who have joint pain from arthritis or have nerve damage from overuse (carpal tunnel syndrome). These exercises can also help people who have injured a hand. Exercises Most of these hand exercises are gentle stretching and motion exercises. It is usually safe to do them often throughout the day. Warming up your hands before exercise may help to reduce stiffness. You can do this with gentle massage or by placing your hands in warm water for 10-15 minutes. It is normal to feel some stretching, pulling, tightness, or mild discomfort as you begin new exercises. This will gradually improve. Stop an exercise right away if you feel sudden, severe pain or your pain gets worse. Ask your health care provider which exercises are best for you. Knuckle bend or "claw" fist 1. Stand or sit with your arm, hand, and all five fingers pointed straight up. Make sure to keep your wrist straight during the exercise. 2. Gently bend your fingers down toward your palm until the tips of your fingers are touching the top of your palm. Keep your big knuckle straight and just bend the small knuckles in your fingers. 3. Hold this position for __________ seconds. 4. Straighten (extend) your fingers back to the starting position. Repeat this exercise 5-10 times with each hand. Full finger fist 1. Stand or sit with your arm, hand, and all five fingers pointed straight up. Make sure to keep your wrist straight during the exercise. 2. Gently bend your fingers into your palm until the tips of your fingers are touching the middle of your palm. 3. Hold this position for __________ seconds. 4. Extend your fingers back to the starting position, stretching every  joint fully. Repeat this exercise 5-10 times with each hand. Straight fist 1. Stand or sit with your arm, hand, and all five fingers pointed straight up. Make sure to keep your wrist straight during the exercise. 2. Gently bend your fingers at the big knuckle, where your fingers meet your hand, and the middle knuckle. Keep the knuckle at the tips of your fingers straight and try to touch the bottom of your palm. 3. Hold this position for __________ seconds. 4. Extend your fingers back to the starting position, stretching every joint fully. Repeat this exercise 5-10 times with each hand. Tabletop 1. Stand or sit with your arm, hand, and all five fingers pointed straight up. Make sure to keep your wrist straight during the exercise. 2. Gently bend your fingers at the big knuckle, where your fingers meet your hand, as far down as you can while keeping the small knuckles in your fingers straight. Think of forming a tabletop with your fingers. 3. Hold this position for __________ seconds. 4. Extend your fingers back to the starting position, stretching every joint fully. Repeat this exercise 5-10 times with each hand. Finger spread 1. Place your hand flat on a table with your palm facing down. Make sure your wrist stays straight as you do this exercise. 2. Spread your fingers and thumb apart from each other as far as you can until you feel a gentle stretch. Hold this position for __________ seconds. 3. Bring your fingers and thumb tight together again. Hold this position for __________ seconds. Repeat this exercise 5-10 times with each hand.   Making circles 1. Stand or sit with your arm, hand, and all five fingers pointed straight up. Make sure to keep your wrist straight during the exercise. 2. Make a circle by touching the tip of your thumb to the tip of your index finger. 3. Hold for __________ seconds. Then open your hand wide. 4. Repeat this motion with your thumb and each finger on your  hand. Repeat this exercise 5-10 times with each hand. Thumb motion 1. Sit with your forearm resting on a table and your wrist straight. Your thumb should be facing up toward the ceiling. Keep your fingers relaxed as you move your thumb. 2. Lift your thumb up as high as you can toward the ceiling. Hold for __________ seconds. 3. Bend your thumb across your palm as far as you can, reaching the tip of your thumb for the small finger (pinkie) side of your palm. Hold for __________ seconds. Repeat this exercise 5-10 times with each hand. Grip strengthening 1. Hold a stress ball or other soft ball in the middle of your hand. 2. Slowly increase the pressure, squeezing the ball as much as you can without causing pain. Think of bringing the tips of your fingers into the middle of your palm. All of your finger joints should bend when doing this exercise. 3. Hold your squeeze for __________ seconds, then relax. Repeat this exercise 5-10 times with each hand.   Contact a health care provider if:  Your hand pain or discomfort gets much worse when you do an exercise.  Your hand pain or discomfort does not improve within 2 hours after you exercise. If you have any of these problems, stop doing these exercises right away. Do not do them again unless your health care provider says that you can. Get help right away if:  You develop sudden, severe hand pain or swelling. If this happens, stop doing these exercises right away. Do not do them again unless your health care provider says that you can. This information is not intended to replace advice given to you by your health care provider. Make sure you discuss any questions you have with your health care provider. Document Revised: 02/15/2019 Document Reviewed: 10/26/2018 [employer]vier Patient Education  2021 [employer]  

## 2021-01-15 LAB — CYCLIC CITRUL PEPTIDE ANTIBODY, IGG: Cyclic Citrullin Peptide Ab: <16 UNITS

## 2021-01-15 LAB — SEDIMENTATION RATE: Sed Rate: 25 mm/h (ref 0–30)

## 2021-01-15 LAB — RHEUMATOID FACTOR: Rheumatoid fact SerPl-aCnc: <14 IU/mL (ref <14)

## 2021-01-15 LAB — URIC ACID: Uric Acid, Serum: 5.3 mg/dL (ref 2.5–7.0)

## 2021-01-31 NOTE — Progress Notes (Deleted)
Office Visit Note  Patient: [name]             Date of Birth: 01/30/1961           MRN: [id number]             PCP: Sarah Organ., MD Referring: Sarah Organ., MD Visit Date: 02/11/2021 Occupation: @GUAROCC @  Subjective:  No chief complaint on file.   History of Present Illness: [name] is a 60 y.o. female ***   Activities of Daily Living:  Patient reports morning stiffness for *** {minute/hour:19697}.   Patient {ACTIONS;DENIES/REPORTS:21021675::"Denies"} nocturnal pain.  Difficulty dressing/grooming: {ACTIONS;DENIES/REPORTS:21021675::"Denies"} Difficulty climbing stairs: {ACTIONS;DENIES/REPORTS:21021675::"Denies"} Difficulty getting out of chair: {ACTIONS;DENIES/REPORTS:21021675::"Denies"} Difficulty using hands for taps, buttons, cutlery, and/or writing: {ACTIONS;DENIES/REPORTS:21021675::"Denies"}  No Rheumatology ROS completed.   PMFS History:  Patient Active Problem List   Diagnosis Date Noted  . Osteoarthritis of left shoulder 01/13/2017  . S/P shoulder replacement, left 01/13/2017  . OSA on CPAP 04/01/2015  . Subacute confusional state 04/01/2015  . Hallucinations, visual 04/01/2015  . Hypnapompic hallucinations 04/01/2015  . Sleep apnea with use of continuous positive airway pressure (CPAP)   . Hypersomnia, persistent 03/30/2013  . Obstructive sleep apnea (adult) (pediatric) 03/30/2013  . CONSTIPATION 11/18/2008  . RECTAL BLEEDING 11/18/2008  . HEMORRHOIDS, INTERNAL 11/15/2008  . DIVERTICULOSIS, COLON 11/15/2008  . CELLULITIS/ABSCESS, TRUNK 02/22/2008  . LOW BACK PAIN 02/22/2008  . DIZZINESS 02/22/2008  . FATIGUE 02/22/2008  . PALPITATIONS 02/22/2008  . MURMUR, CARDIAC, UNDIAGNOSED 02/22/2008  . BREAST CANCER, HX OF 02/22/2008  . COUGH 01/05/2008  . DEPRESSION 07/05/2007  . UTERINE POLYP 07/05/2007  . FIBROMYALGIA 07/05/2007  . COLONIC POLYPS, HX OF 07/05/2007  . HX, PERSONAL, CERVICAL DYSPLASIA 07/05/2007    Past Medical  History:  Diagnosis Date  . Anxiety   . Arthritis   . Breast cancer ([place])    rt breast  and left breast breast removed  . Breast reconstruction deformity   . Complication of anesthesia    shaking-after mastectomies-resp and hr dropped  . Depression, major    suicidal thoughts -implants, mental health, 05/11  . Fibromyalgia   . Headache(784.0)   . Hypersomnia    sleep attacks  . Hypothyroidism   . Migraine headache   . Narcolepsy   . Neoplasm    malignant breast, history of status post bilateral  . Post-operative nausea and vomiting   . Sleep apnea    CPAP  . Sleep apnea with use of continuous positive airway pressure (CPAP)    AHI 20 in HST, titration to 6 cm water - residual AHi 6 . remaining hypersomnic.     Family History  Problem Relation Age of Onset  . Breast cancer Mother   . Prostate cancer Father   . Breast cancer Maternal Aunt        breast  . Colon cancer Maternal Grandmother        age unknown, ? 31's  . Breast cancer Maternal Grandmother   . Colon cancer Cousin   . Healthy Brother   . Healthy Son   . Healthy Daughter   . Healthy Daughter   . Leukemia Granddaughter    Past Surgical History:  Procedure Laterality Date  . ARTHRODESIS METATARSALPHALANGEAL JOINT (MTPJ) Right 07/28/2017   Procedure: ARTHRODESIS  RIGHT HALLUX METATARSALPHALANGEAL JOINT (MTPJ);  Surgeon: Wylene Simmer, MD;  Location: [place];  Service: Orthopedics;  Laterality: Right;  . Breast reconstructioin  7/09   flaps-bilat  mastectomies  . BTL    . CARPAL TUNNEL RELEASE  2011   both hands  . CARPOMETACARPEL SUSPENSION PLASTY Right 08/29/2014   Procedure: REMOVAL DERMASPAN SUSPENSIONPLASTY RIGHT THUMB ABDUCTOR POLLIS TRANSFER;  Surgeon: Daryll Brod, MD;  Location: [place];  Service: Orthopedics;  Laterality: Right;  . CATARACT EXTRACTION W/PHACO Right 12/17/2019   Procedure: CATARACT EXTRACTION PHACO AND INTRAOCULAR LENS PLACEMENT (IOC);  Surgeon: Baruch Goldmann, MD;  Location: [facility];  Service: Ophthalmology;  Laterality: Right;  CDE: 6.54  . CATARACT EXTRACTION W/PHACO Left 12/31/2019   Procedure: CATARACT EXTRACTION PHACO AND INTRAOCULAR LENS PLACEMENT (IOC);  Surgeon: Baruch Goldmann, MD;  Location: [facility];  Service: Ophthalmology;  Laterality: Left;  CDE: 3.66  . COLONOSCOPY W/ POLYPECTOMY  4/03   tubulovillous adenoma  . FINGER ARTHROSCOPY WITH CARPOMETACARPEL [facility]) ARTHROPLASTY Right 06/20/2014   Procedure: ARTHROSCOPY RIGHT THUMB CARPOMETACARPEL GRAFT JACKET INTERPOSITION PARTIAL TRAPEZIECTOMY POSSIBLE OPEN;  Surgeon: Daryll Brod, MD;  Location: [place];  Service: Orthopedics;  Laterality: Right;  . GANGLION CYST EXCISION     both [phone]  . HAMMER TOE SURGERY Right 07/28/2017   Procedure: THIRD HAMMERTOE CORRECTION;  Surgeon: Wylene Simmer, MD;  Location: [place];  Service: Orthopedics;  Laterality: Right;  . HARDWARE REMOVAL Right 07/28/2017   Procedure: REMOVAL OF DEEP IMPLANTS;  Surgeon: Wylene Simmer, MD;  Location: [place];  Service: Orthopedics;  Laterality: Right;  . hysterestomy  2005  . MASTECTOMY Bilateral 2008  . post- a -cath  2008  . right knee arthroscopy    . TOTAL SHOULDER ARTHROPLASTY Left 01/13/2017   Procedure: LEFT TOTAL SHOULDER ARTHROPLASTY;  Surgeon: Tania Ade, MD;  Location: [place];  Service: Orthopedics;  Laterality: Left;  LEFT TOTAL SHOULDER ARTHROPLASTY  . TRIGGER FINGER RELEASE Right 08/29/2014   Procedure: RELEASE TRIGGER FINGER/A-1 PULLEY;  Surgeon: Daryll Brod, MD;  Location: [place];  Service: Orthopedics;  Laterality: Right;  . wisdom teeth out     Social History   Social History Narrative   Patient  Is married [name]) and lives at home with her husband.   Patient has 3 children.    Patient is disabled.    Patient has a college education.    Patient is right-handed.   Patient drinks one cup of coffee daily and one coke per  day.   Immunization History  Administered Date(s) Administered  . Moderna Sars-Covid-2 Vaccination 01/23/2020, 02/22/2020, 11/06/2020     Objective: Vital Signs: There were no vitals taken for this visit.   Physical Exam   Musculoskeletal Exam: ***  CDAI Exam: CDAI Score: - Patient Global: -; Provider Global: - Swollen: -; Tender: - Joint Exam 02/11/2021   No joint exam has been documented for this visit   There is currently no information documented on the homunculus. Go to the Rheumatology activity and complete the homunculus joint exam.  Investigation: No additional findings.  Imaging: XR Hand 2 View Left  Result Date: 01/14/2021 Severe CMC narrowing and subluxation was noted.  No MCP, intercarpal radiocarpal joint space narrowing was noted.  PIP and DIP narrowing was noted.  Subluxation of second DIP joint was noted. Impression: These findings are consistent with osteoarthritis of the hand.  XR Hand 2 View Right  Result Date: 01/14/2021 Severe CMC narrowing and subluxation was noted.  No MCP, intercarpal or radiocarpal joint space narrowing was noted.  Severe PIP and DIP narrowing was noted. Impression: These findings are consistent with  severe osteoarthritis of the hand.   Recent Labs: Lab Results  Component Value Date   WBC 11.6 (H) 01/14/2017   HGB 10.7 (L) 01/14/2017   PLT 208 01/14/2017   NA 141 01/14/2017   K 3.5 01/14/2017   CL 107 01/14/2017   CO2 27 01/14/2017   GLUCOSE 152 (H) 01/14/2017   BUN 7 01/14/2017   CREATININE 0.73 01/14/2017   BILITOT 0.4 01/07/2017   ALKPHOS 72 01/07/2017   AST 26 01/07/2017   ALT 25 01/07/2017   PROT 6.8 01/07/2017   ALBUMIN 4.1 01/07/2017   CALCIUM 8.4 (L) 01/14/2017   GFRAA >60 01/14/2017   January 15, 2019 ESR 25, RF negative, anti-CCP negative, uric acid 5.3   Speciality Comments: No specialty comments available.  Procedures:  No procedures performed Allergies: Sulfonamide derivatives   Assessment / Plan:      Visit Diagnoses: No diagnosis found.  Orders: No orders of the defined types were placed in this encounter.  No orders of the defined types were placed in this encounter.   Face-to-face time spent with patient was *** minutes. Greater than 50% of time was spent in counseling and coordination of care.  Follow-Up Instructions: No follow-ups on file.   Bo Merino, MD  Note - This record has been created using Editor, commissioning.  Chart creation errors have been sought, but may not always  have been located. Such creation errors do not reflect on  the standard of medical care.

## 2021-02-11 ENCOUNTER — Ambulatory Visit: Payer: No Typology Code available for payment source | Admitting: Rheumatology

## 2021-02-11 DIAGNOSIS — Z8659 Personal history of other mental and behavioral disorders: Secondary | ICD-10-CM

## 2021-02-11 DIAGNOSIS — M797 Fibromyalgia: Secondary | ICD-10-CM

## 2021-02-11 DIAGNOSIS — Z8719 Personal history of other diseases of the digestive system: Secondary | ICD-10-CM

## 2021-02-11 DIAGNOSIS — R2689 Other abnormalities of gait and mobility: Secondary | ICD-10-CM

## 2021-02-11 DIAGNOSIS — G4733 Obstructive sleep apnea (adult) (pediatric): Secondary | ICD-10-CM

## 2021-02-11 DIAGNOSIS — Z86010 Personal history of colonic polyps: Secondary | ICD-10-CM

## 2021-02-11 DIAGNOSIS — M2141 Flat foot [pes planus] (acquired), right foot: Secondary | ICD-10-CM

## 2021-02-11 DIAGNOSIS — M19041 Primary osteoarthritis, right hand: Secondary | ICD-10-CM

## 2021-02-11 DIAGNOSIS — M170 Bilateral primary osteoarthritis of knee: Secondary | ICD-10-CM

## 2021-02-11 DIAGNOSIS — Z96612 Presence of left artificial shoulder joint: Secondary | ICD-10-CM

## 2021-02-11 DIAGNOSIS — Z853 Personal history of malignant neoplasm of breast: Secondary | ICD-10-CM

## 2021-08-13 ENCOUNTER — Other Ambulatory Visit (HOSPITAL_COMMUNITY): Payer: Self-pay | Admitting: Student

## 2021-08-13 DIAGNOSIS — M79604 Pain in right leg: Secondary | ICD-10-CM

## 2021-08-26 ENCOUNTER — Ambulatory Visit (HOSPITAL_COMMUNITY)
Admission: RE | Admit: 2021-08-26 | Discharge: 2021-08-26 | Disposition: A | Discharge status: Home / Self Care | Payer: No Typology Code available for payment source | Source: Ambulatory Visit | Attending: Student | Admitting: Student

## 2021-08-26 ENCOUNTER — Other Ambulatory Visit: Payer: Self-pay

## 2021-08-26 DIAGNOSIS — M79604 Pain in right leg: Secondary | ICD-10-CM | POA: Insufficient documentation

## 2021-09-01 ENCOUNTER — Other Ambulatory Visit (HOSPITAL_BASED_OUTPATIENT_CLINIC_OR_DEPARTMENT_OTHER): Payer: Self-pay | Admitting: Orthopedic Surgery

## 2021-09-01 ENCOUNTER — Other Ambulatory Visit (HOSPITAL_COMMUNITY): Payer: Self-pay | Admitting: Orthopedic Surgery

## 2021-09-01 DIAGNOSIS — M25562 Pain in left knee: Secondary | ICD-10-CM

## 2021-09-10 ENCOUNTER — Ambulatory Visit (HOSPITAL_COMMUNITY)
Admission: RE | Admit: 2021-09-10 | Discharge: 2021-09-10 | Disposition: A | Discharge status: Home / Self Care | Payer: No Typology Code available for payment source | Source: Ambulatory Visit | Attending: Orthopedic Surgery | Admitting: Orthopedic Surgery

## 2021-09-10 ENCOUNTER — Other Ambulatory Visit: Payer: Self-pay

## 2021-09-10 DIAGNOSIS — M25562 Pain in left knee: Secondary | ICD-10-CM | POA: Insufficient documentation

## 2021-11-23 ENCOUNTER — Ambulatory Visit (HOSPITAL_COMMUNITY): Payer: No Typology Code available for payment source | Admitting: Physical Therapy

## 2021-11-25 ENCOUNTER — Ambulatory Visit (HOSPITAL_COMMUNITY): Payer: No Typology Code available for payment source | Admitting: Physical Therapy

## 2021-11-25 ENCOUNTER — Telehealth (HOSPITAL_COMMUNITY): Payer: Self-pay | Admitting: Physical Therapy

## 2021-11-25 NOTE — Telephone Encounter (Signed)
Pt will call her MD she is in too much pain. She r/s eval again for 11/27/2021

## 2021-11-27 ENCOUNTER — Other Ambulatory Visit: Payer: Self-pay

## 2021-11-27 ENCOUNTER — Ambulatory Visit (HOSPITAL_COMMUNITY): Payer: No Typology Code available for payment source | Attending: Orthopedic Surgery

## 2021-11-27 ENCOUNTER — Encounter (HOSPITAL_COMMUNITY): Payer: Self-pay

## 2021-11-27 DIAGNOSIS — M25562 Pain in left knee: Secondary | ICD-10-CM | POA: Insufficient documentation

## 2021-11-27 DIAGNOSIS — Z9889 Other specified postprocedural states: Secondary | ICD-10-CM | POA: Diagnosis not present

## 2021-11-27 DIAGNOSIS — R262 Difficulty in walking, not elsewhere classified: Secondary | ICD-10-CM | POA: Insufficient documentation

## 2021-11-27 NOTE — Therapy (Signed)
[place], [place], [address] Phone: [phone]   Fax:  [phone]  Physical Therapy Evaluation  Patient Details  Name: [name] MRN: [id number] Date of Birth: 12-Oct-1961 Referring Provider (PT): Nicholes Stairs, MD   Encounter Date: 11/27/2021   PT End of Session - 11/27/21 1121     Visit Number 1    Number of Visits 18    Authorization Type [employer]ET[employer]; [place] Preferred    Authorization - Visit Number 1    Authorization - Number of Visits 90    Progress Note Due on Visit 10    PT Start Time 7001    PT Stop Time 1212    PT Time Calculation (min) 49 min    Activity Tolerance Patient limited by pain    Behavior During Therapy Restless;Agitated             Past Medical History:  Diagnosis Date   Anxiety    Arthritis    Breast cancer ([place])    rt breast  and left breast breast removed   Breast reconstruction deformity    Complication of anesthesia    shaking-after mastectomies-resp and hr dropped   Depression, major    suicidal thoughts -implants, mental health, 05/11   Fibromyalgia    Headache(784.0)    Hypersomnia    sleep attacks   Hypothyroidism    Migraine headache    Narcolepsy    Neoplasm    malignant breast, history of status post bilateral   Post-operative nausea and vomiting    Sleep apnea    CPAP   Sleep apnea with use of continuous positive airway pressure (CPAP)    AHI 20 in HST, titration to 6 cm water - residual AHi 6 . remaining hypersomnic.     Past Surgical History:  Procedure Laterality Date   ARTHRODESIS METATARSALPHALANGEAL JOINT (MTPJ) Right 07/28/2017   Procedure: ARTHRODESIS  RIGHT HALLUX METATARSALPHALANGEAL JOINT (MTPJ);  Surgeon: Wylene Simmer, MD;  Location: [place];  Service: Orthopedics;  Laterality: Right;   Breast reconstructioin  7/09   flaps-bilat mastectomies   BTL     CARPAL TUNNEL RELEASE  2011   both hands    CARPOMETACARPEL SUSPENSION PLASTY Right 08/29/2014   Procedure: REMOVAL DERMASPAN SUSPENSIONPLASTY RIGHT THUMB ABDUCTOR POLLIS TRANSFER;  Surgeon: Daryll Brod, MD;  Location: [place];  Service: Orthopedics;  Laterality: Right;   CATARACT EXTRACTION W/PHACO Right 12/17/2019   Procedure: CATARACT EXTRACTION PHACO AND INTRAOCULAR LENS PLACEMENT (IOC);  Surgeon: Baruch Goldmann, MD;  [facility];  Service: Ophthalmology;  Laterality: Right;  CDE: 6.54   CATARACT EXTRACTION W/PHACO Left 12/31/2019   Procedure: CATARACT EXTRACTION PHACO AND INTRAOCULAR LENS PLACEMENT (IOC);  Surgeon: Baruch Goldmann, MD;  [facility]: [facility];  Service: Ophthalmology;  Laterality: Left;  CDE: 3.66   COLONOSCOPY W/ POLYPECTOMY  4/03   tubulovillous adenoma   FINGER ARTHROSCOPY WITH CARPOMETACARPEL ([place]) ARTHROPLASTY Right 06/20/2014   Procedure: ARTHROSCOPY RIGHT THUMB CARPOMETACARPEL GRAFT JACKET INTERPOSITION PARTIAL TRAPEZIECTOMY POSSIBLE OPEN;  Surgeon: Daryll Brod, MD;  Location: [place];  Service: Orthopedics;  Laterality: Right;   GANGLION CYST EXCISION     both [phone]   HAMMER TOE SURGERY Right 07/28/2017   Procedure: THIRD HAMMERTOE CORRECTION;  Surgeon: Wylene Simmer, MD;  Location: [place];  Service: Orthopedics;  Laterality: Right;   HARDWARE REMOVAL Right 07/28/2017   Procedure: REMOVAL OF DEEP IMPLANTS;  Surgeon: Wylene Simmer, MD;  Location: [place];  Service: Orthopedics;  Laterality: Right;   hysterestomy  2005   MASTECTOMY Bilateral 2008   post- a -cath  2008   right knee arthroscopy     TOTAL SHOULDER ARTHROPLASTY Left 01/13/2017   Procedure: LEFT TOTAL SHOULDER ARTHROPLASTY;  Surgeon: Tania Ade, MD;  Location: [place];  Service: Orthopedics;  Laterality: Left;  LEFT TOTAL SHOULDER ARTHROPLASTY   TRIGGER FINGER RELEASE Right 08/29/2014   Procedure: RELEASE TRIGGER FINGER/A-1 PULLEY;  Surgeon: Daryll Brod, MD;  Location: [place];  Service: Orthopedics;  Laterality: Right;   wisdom teeth out      There were no vitals filed for this visit.    Subjective Assessment - 11/27/21 1128     Subjective Patient reports that she has been experiencing high pain levels since the surgery and the medication is not doing much to touch the pain. Although her Lt knee is what she is being seen for, the Rt knee is having similar issues. She also has Rt shoulder pain from what she reports to be arthritis.    Limitations Sitting;Walking;Standing;Lifting;House hold activities    Patient Stated Goals Return to walking without an AD or pain.    Currently in Pain? Yes    Pain Score 8     Pain Location Knee    Pain Orientation Left    Pain Type Surgical pain    Pain Frequency Constant                [facility] PT Assessment - 11/27/21 0001       Assessment   Medical Diagnosis S/p L knee partial meni    Referring Provider (PT) Nicholes Stairs, MD    Onset Date/Surgical Date 11/16/21    Next MD Visit 12/01/2021    Prior Therapy Yes      Precautions   Precautions None      Restrictions   Weight Bearing Restrictions No      Home Environment   Living Environment Private residence    Living Arrangements Spouse/significant other    Home Access Stairs to enter    Entrance Stairs-Number of Steps 8    Entrance Stairs-Rails Cannot reach both      Prior Function   Level of [place] On disability      Cognition   Overall Cognitive Status Within Functional Limits for tasks assessed      Sensation   Additional Comments Denies sensation changes      ROM / Strength   AROM / PROM / Strength AROM;Strength      AROM   AROM Assessment Site Knee    Right/Left Knee Left;Right    Right Knee Extension -3    Right Knee Flexion 123    Left Knee Extension -6    Left Knee Flexion 89      Strength   Strength Assessment Site Knee    Right/Left Knee Right;Left    Right Knee Flexion  4+/5    Right Knee Extension 5/5    Left Knee Flexion 3-/5    Left Knee Extension 3-/5      Ambulation/Gait   Gait Pattern Step-to pattern                        Objective measurements completed on examination: See above findings.                PT Education - 11/27/21  28     Education Details HEP, POC, and stair negotiation techniques    Person(s) Educated Patient    Methods Explanation;Demonstration;Verbal cues;Handout    Comprehension Verbalized understanding;Returned demonstration              PT Short Term Goals - 11/27/21 1224       PT SHORT TERM GOAL #1   Title Patient wil achieve a L knee extension AROM of at least -3 degrees.    Baseline -6    Target Date 12/18/21      PT SHORT TERM GOAL #2   Title Patient wil achieve a L knee flexion AROM of at least 110 degrees.    Baseline 89    Target Date 12/18/21      PT SHORT TERM GOAL #3   Title Patient will achieve a Lt knee extension MMT of at least 4/5 and without an onset of pain greater than 3/10.    Baseline 3-/5    Target Date 12/18/21               PT Long Term Goals - 11/27/21 1229       PT LONG TERM GOAL #1   Title Patient wil achieve a Lt knee extension AROM of at least 0 degrees.    Baseline -6    Target Date 01/08/22      PT LONG TERM GOAL #2   Title Patient wil achieve a Lt knee flexion AROM of at least 120 degrees.    Baseline 89    Target Date 01/08/22      PT LONG TERM GOAL #3   Title Patient will achieve a Lt knee extension MMT of at least 5/5 and without an onset of pain.    Baseline -3    Target Date 01/08/22      PT LONG TERM GOAL #4   Title Patient will be able to negotiate a normal flight of stairs(10-14 steps) reciprocally with the use of one hand rail and without an onset of Lt knee pain greater than a 1/10.    Target Date 01/08/22      PT LONG TERM GOAL #5   Title Patient will be able to ambulate independently with normalized gait for at  least 3100ft without an onset of Lt knee pain greater than a 1/10.    Baseline Walker use and step to gait pattern.    Target Date 01/08/22                    Plan - 11/27/21 1237     Clinical Impression Statement Patient is a  61 y.o. female presenting to physical therapy s/p Lt knee medial partial meniscectomy. She presents with pain limited deficits in Lt knee strength, ROM, endurance, and functional mobility with ADL. She is having to modify and restrict ADL as indicated by subjective information and objective measures which is affecting overall participation. Patient will benefit from skilled physical therapy in order to improve function and reduce impairment.    Personal Factors and Comorbidities Comorbidity 1;Behavior Pattern;Age;Fitness    Comorbidities Fibromyalgia    Examination-Participation Restrictions Community Activity;Driving;Laundry;Meal Prep;Cleaning    Stability/Clinical Decision Making Evolving/Moderate complexity    Clinical Decision Making Low    Rehab Potential Fair    PT Frequency 3x / week    PT Duration 6 weeks    PT Treatment/Interventions ADLs/Self Care Home Management;Electrical Stimulation;DME Instruction;Gait training;Stair training;Functional mobility training;Therapeutic activities;Therapeutic exercise;Balance training;Neuromuscular re-education;Patient/family education;Manual techniques;Passive range of motion;Scar  mobilization    PT Next Visit Plan Continue with Lt knee ROM exercises as needed and progress strengthneing exercises as tolerated.    PT Home Exercise Plan Sitting Heel Slide with Towel - 3-4 x daily - 7 x weekly - 4 reps - 30s hold  Seated Knee Extension Stretch with Chair - 3-4 x daily - 7 x weekly - 4 reps - 30s hold  Supine Quad Set - 2-3 x daily - 7 x weekly - 2 sets - 8 reps - 3s hold  Supine Ankle Pumps - 5 x daily - 7 x weekly - 2 sets - 20 reps  Supine Knee Extension Strengthening - 2-3 x daily - 7 x weekly - 2 sets - 12 reps  Seated  Long Arc Quad - 2-3 x daily - 7 x weekly - 2 sets - 8 reps  Standing Knee Flexion AROM with Chair Support - 2-3 x daily - 7 x weekly - 2 sets - 8 reps  Heel rises with counter support - 2-3 x daily - 7 x weekly - 2 sets - 12 reps  Mini Squat with Chair - 2-3 x daily - 7 x weekly - 3 sets - 8 reps    Consulted and Agree with Plan of Care Patient             Patient will benefit from skilled therapeutic intervention in order to improve the following deficits and impairments:  Abnormal gait, Decreased balance, Decreased endurance, Decreased mobility, Difficulty walking, Hypomobility, Decreased activity tolerance, Decreased knowledge of use of DME, Decreased range of motion, Decreased strength, Increased edema  Visit Diagnosis: S/P medial meniscectomy of left knee  Difficulty walking  Left knee pain, unspecified chronicity     Problem List Patient Active Problem List   Diagnosis Date Noted   Osteoarthritis of left shoulder 01/13/2017   S/P shoulder replacement, left 01/13/2017   OSA on CPAP 04/01/2015   Subacute confusional state 04/01/2015   Hallucinations, visual 04/01/2015   Hypnapompic hallucinations 04/01/2015   Sleep apnea with use of continuous positive airway pressure (CPAP)    Hypersomnia, persistent 03/30/2013   Obstructive sleep apnea (adult) (pediatric) 03/30/2013   CONSTIPATION 11/18/2008   RECTAL BLEEDING 11/18/2008   HEMORRHOIDS, INTERNAL 11/15/2008   DIVERTICULOSIS, COLON 11/15/2008   CELLULITIS/ABSCESS, TRUNK 02/22/2008   LOW BACK PAIN 02/22/2008   DIZZINESS 02/22/2008   FATIGUE 02/22/2008   PALPITATIONS 02/22/2008   MURMUR, CARDIAC, UNDIAGNOSED 02/22/2008   BREAST CANCER, HX OF 02/22/2008   COUGH 01/05/2008   DEPRESSION 07/05/2007   UTERINE POLYP 07/05/2007   FIBROMYALGIA 07/05/2007   COLONIC POLYPS, HX OF 07/05/2007   HX, PERSONAL, CERVICAL DYSPLASIA 07/05/2007    2:12 PM,11/27/21 Adalberto Cole, DPT [facility]   [facility] [place], [place], [address] Phone: [phone]   Fax:  [phone]  Name: [name] MRN: [id number] Date of Birth: [birth year]

## 2021-11-27 NOTE — Patient Instructions (Signed)
Access Code: [email or web address] URL: [email or web address] Date: 11/27/2021 Prepared by: Adalberto Cole  Exercises Sitting Heel Slide with Towel - 3-4 x daily - 7 x weekly - 4 reps - 30s hold Seated Knee Extension Stretch with Chair - 3-4 x daily - 7 x weekly - 4 reps - 30s hold Supine Quad Set - 2-3 x daily - 7 x weekly - 2 sets - 8 reps - 3s hold Supine Ankle Pumps - 5 x daily - 7 x weekly - 2 sets - 20 reps Supine Knee Extension Strengthening - 2-3 x daily - 7 x weekly - 2 sets - 12 reps Seated Long Arc Quad - 2-3 x daily - 7 x weekly - 2 sets - 8 reps Standing Knee Flexion AROM with Chair Support - 2-3 x daily - 7 x weekly - 2 sets - 8 reps Heel rises with counter support - 2-3 x daily - 7 x weekly - 2 sets - 12 reps Mini Squat with Chair - 2-3 x daily - 7 x weekly - 3 sets - 8 reps

## 2021-11-30 ENCOUNTER — Other Ambulatory Visit: Payer: Self-pay

## 2021-11-30 ENCOUNTER — Ambulatory Visit (HOSPITAL_COMMUNITY): Payer: No Typology Code available for payment source | Admitting: Physical Therapy

## 2021-11-30 DIAGNOSIS — M25562 Pain in left knee: Secondary | ICD-10-CM

## 2021-11-30 DIAGNOSIS — Z9889 Other specified postprocedural states: Secondary | ICD-10-CM

## 2021-11-30 DIAGNOSIS — R262 Difficulty in walking, not elsewhere classified: Secondary | ICD-10-CM

## 2021-11-30 NOTE — Therapy (Signed)
[place], [place], [address] Phone: [phone]   Fax:  [phone]  Physical Therapy Treatment  Patient Details  Name: [name] MRN: [id number] Date of Birth: Mar 02, 1961 Referring Provider (PT): Nicholes Stairs, MD   Encounter Date: 11/30/2021   PT End of Session - 11/30/21 1124     Visit Number 2    Number of Visits 18    Date for PT Re-Evaluation 01/08/22    Authorization Type AETNA; [place] Preferred    Authorization - Visit Number 2    Authorization - Number of Visits 90    Progress Note Due on Visit 10    PT Start Time 1124   late arrival   PT Stop Time 1155    PT Time Calculation (min) 31 min    Activity Tolerance Patient limited by pain    Behavior During Therapy [facility] for tasks assessed/performed             Past Medical History:  Diagnosis Date   Anxiety    Arthritis    Breast cancer ([place])    rt breast  and left breast breast removed   Breast reconstruction deformity    Complication of anesthesia    shaking-after mastectomies-resp and hr dropped   Depression, major    suicidal thoughts -implants, mental health, 05/11   Fibromyalgia    Headache(784.0)    Hypersomnia    sleep attacks   Hypothyroidism    Migraine headache    Narcolepsy    Neoplasm    malignant breast, history of status post bilateral   Post-operative nausea and vomiting    Sleep apnea    CPAP   Sleep apnea with use of continuous positive airway pressure (CPAP)    AHI 20 in HST, titration to 6 cm water - residual AHi 6 . remaining hypersomnic.     Past Surgical History:  Procedure Laterality Date   ARTHRODESIS METATARSALPHALANGEAL JOINT (MTPJ) Right 07/28/2017   Procedure: ARTHRODESIS  RIGHT HALLUX METATARSALPHALANGEAL JOINT (MTPJ);  Surgeon: Wylene Simmer, MD;  Location: [place];  Service: Orthopedics;  Laterality: Right;   Breast reconstructioin  7/09   flaps-bilat mastectomies    BTL     CARPAL TUNNEL RELEASE  2011   both hands   CARPOMETACARPEL SUSPENSION PLASTY Right 08/29/2014   Procedure: REMOVAL DERMASPAN SUSPENSIONPLASTY RIGHT THUMB ABDUCTOR POLLIS TRANSFER;  Surgeon: Daryll Brod, MD;  Location: [place];  Service: Orthopedics;  Laterality: Right;   CATARACT EXTRACTION W/PHACO Right 12/17/2019   Procedure: CATARACT EXTRACTION PHACO AND INTRAOCULAR LENS PLACEMENT (IOC);  Surgeon: Baruch Goldmann, MD;  [facility];  Service: Ophthalmology;  Laterality: Right;  CDE: 6.54   CATARACT EXTRACTION W/PHACO Left 12/31/2019   Procedure: CATARACT EXTRACTION PHACO AND INTRAOCULAR LENS PLACEMENT (IOC);  Surgeon: Baruch Goldmann, MD;  Location[facility];  Service: Ophthalmology;  Laterality: Left;  CDE: 3.66   COLONOSCOPY W/ POLYPECTOMY  4/03   tubulovillous adenoma   FINGER ARTHROSCOPY WITH CARPOMETACARPEL ([place]) ARTHROPLASTY Right 06/20/2014   Procedure: ARTHROSCOPY RIGHT THUMB CARPOMETACARPEL GRAFT JACKET INTERPOSITION PARTIAL TRAPEZIECTOMY POSSIBLE OPEN;  Surgeon: Daryll Brod, MD;  Location: [place];  Service: Orthopedics;  Laterality: Right;   GANGLION CYST EXCISION     both [phone]   HAMMER TOE SURGERY Right 07/28/2017   Procedure: THIRD HAMMERTOE CORRECTION;  Surgeon: Wylene Simmer, MD;  Location: [place];  Service: Orthopedics;  Laterality: Right;   HARDWARE REMOVAL  Right 07/28/2017   Procedure: REMOVAL OF DEEP IMPLANTS;  Surgeon: Wylene Simmer, MD;  Location: [place];  Service: Orthopedics;  Laterality: Right;   hysterestomy  2005   MASTECTOMY Bilateral 2008   post- a -cath  2008   right knee arthroscopy     TOTAL SHOULDER ARTHROPLASTY Left 01/13/2017   Procedure: LEFT TOTAL SHOULDER ARTHROPLASTY;  Surgeon: Tania Ade, MD;  Location: [place];  Service: Orthopedics;  Laterality: Left;  LEFT TOTAL SHOULDER ARTHROPLASTY   TRIGGER FINGER RELEASE Right 08/29/2014   Procedure: RELEASE TRIGGER FINGER/A-1  PULLEY;  Surgeon: Daryll Brod, MD;  Location: [place];  Service: Orthopedics;  Laterality: Right;   wisdom teeth out      There were no vitals filed for this visit.   Subjective Assessment - 11/30/21 1126     Subjective Patient reports ongoing pain. she has not done HEP because she is in so much pain. Pain level 8/10. Pain medication is not helping much.    Limitations Sitting;Walking;Standing;Lifting;House hold activities    Patient Stated Goals Return to walking without an AD or pain.    Currently in Pain? Yes    Pain Score 8     Pain Location Knee    Pain Orientation Left    Pain Descriptors / Indicators Burning;Throbbing    Pain Type Surgical pain    Pain Onset 1 to 4 weeks ago    Pain Frequency Constant                [facility] PT Assessment - 11/30/21 0001       AROM   Left Knee Extension -8    Left Knee Flexion 40                           [facility] Adult PT Treatment/Exercise - 11/30/21 0001       Exercises   Exercises Knee/Hip      Knee/Hip Exercises: Stretches   Active Hamstring Stretch Left;5 reps;10 seconds    Active Hamstring Stretch Limitations seated at EOB      Knee/Hip Exercises: Seated   Long Arc Quad Left;10 reps      Knee/Hip Exercises: Supine   Quad Sets Left;20 reps    Quad Sets Limitations 2 sets 5"                       PT Short Term Goals - 11/27/21 1224       PT SHORT TERM GOAL #1   Title Patient wil achieve a L knee extension AROM of at least -3 degrees.    Baseline -6    Target Date 12/18/21      PT SHORT TERM GOAL #2   Title Patient wil achieve a L knee flexion AROM of at least 110 degrees.    Baseline 89    Target Date 12/18/21      PT SHORT TERM GOAL #3   Title Patient will achieve a Lt knee extension MMT of at least 4/5 and without an onset of pain greater than 3/10.    Baseline 3-/5    Target Date 12/18/21               PT Long Term Goals - 11/27/21 1229       PT  LONG TERM GOAL #1   Title Patient wil achieve a Lt knee extension AROM of at least 0 degrees.    Baseline -  6    Target Date 01/08/22      PT LONG TERM GOAL #2   Title Patient wil achieve a Lt knee flexion AROM of at least 120 degrees.    Baseline 89    Target Date 01/08/22      PT LONG TERM GOAL #3   Title Patient will achieve a Lt knee extension MMT of at least 5/5 and without an onset of pain.    Baseline -3    Target Date 01/08/22      PT LONG TERM GOAL #4   Title Patient will be able to negotiate a normal flight of stairs(10-14 steps) reciprocally with the use of one hand rail and without an onset of Lt knee pain greater than a 1/10.    Target Date 01/08/22      PT LONG TERM GOAL #5   Title Patient will be able to ambulate independently with normalized gait for at least 332ft without an onset of Lt knee pain greater than a 1/10.    Baseline Walker use and step to gait pattern.    Target Date 01/08/22                   Plan - 11/30/21 1156     Clinical Impression Statement Patient with very poor tolerance. Increased pain with all activity and positions. Educated patient on importance of compliance with HEP for improved function and decreasing pain. Reviewed pain pathways and encouraged patient to perform exercise daily within tolerance, discussed 20% of max volition to begin with weekly increase. Reviewed pain modalities, ice and elevation techniques. Will continue to progress mobility and strengthening as able for improved functional mobility.    Personal Factors and Comorbidities Comorbidity 1;Behavior Pattern;Age;Fitness    Comorbidities Fibromyalgia    Examination-Participation Restrictions Community Activity;Driving;Laundry;Meal Prep;Cleaning    Stability/Clinical Decision Making Evolving/Moderate complexity    Rehab Potential Fair    PT Frequency 3x / week    PT Duration 6 weeks    PT Treatment/Interventions ADLs/Self Care Home Management;Electrical  Stimulation;DME Instruction;Gait training;Stair training;Functional mobility training;Therapeutic activities;Therapeutic exercise;Balance training;Neuromuscular re-education;Patient/family education;Manual techniques;Passive range of motion;Scar mobilization    PT Next Visit Plan Continue with Lt knee ROM exercises as needed and progress strengthneing exercises as tolerated.    PT Home Exercise Plan Sitting Heel Slide with Towel - 3-4 x daily - 7 x weekly - 4 reps - 30s hold  Seated Knee Extension Stretch with Chair - 3-4 x daily - 7 x weekly - 4 reps - 30s hold  Supine Quad Set - 2-3 x daily - 7 x weekly - 2 sets - 8 reps - 3s hold  Supine Ankle Pumps - 5 x daily - 7 x weekly - 2 sets - 20 reps  Supine Knee Extension Strengthening - 2-3 x daily - 7 x weekly - 2 sets - 12 reps  Seated Long Arc Quad - 2-3 x daily - 7 x weekly - 2 sets - 8 reps  Standing Knee Flexion AROM with Chair Support - 2-3 x daily - 7 x weekly - 2 sets - 8 reps  Heel rises with counter support - 2-3 x daily - 7 x weekly - 2 sets - 12 reps  Mini Squat with Chair - 2-3 x daily - 7 x weekly - 3 sets - 8 reps    Consulted and Agree with Plan of Care Patient             Patient will benefit from  skilled therapeutic intervention in order to improve the following deficits and impairments:  Abnormal gait, Decreased balance, Decreased endurance, Decreased mobility, Difficulty walking, Hypomobility, Decreased activity tolerance, Decreased knowledge of use of DME, Decreased range of motion, Decreased strength, Increased edema  Visit Diagnosis: S/P medial meniscectomy of left knee  Difficulty walking  Left knee pain, unspecified chronicity     Problem List Patient Active Problem List   Diagnosis Date Noted   Osteoarthritis of left shoulder 01/13/2017   S/P shoulder replacement, left 01/13/2017   OSA on CPAP 04/01/2015   Subacute confusional state 04/01/2015   Hallucinations, visual 04/01/2015   Hypnapompic hallucinations  04/01/2015   Sleep apnea with use of continuous positive airway pressure (CPAP)    Hypersomnia, persistent 03/30/2013   Obstructive sleep apnea (adult) (pediatric) 03/30/2013   CONSTIPATION 11/18/2008   RECTAL BLEEDING 11/18/2008   HEMORRHOIDS, INTERNAL 11/15/2008   DIVERTICULOSIS, COLON 11/15/2008   CELLULITIS/ABSCESS, TRUNK 02/22/2008   LOW BACK PAIN 02/22/2008   DIZZINESS 02/22/2008   FATIGUE 02/22/2008   PALPITATIONS 02/22/2008   MURMUR, CARDIAC, UNDIAGNOSED 02/22/2008   BREAST CANCER, HX OF 02/22/2008   COUGH 01/05/2008   DEPRESSION 07/05/2007   UTERINE POLYP 07/05/2007   FIBROMYALGIA 07/05/2007   COLONIC POLYPS, HX OF 07/05/2007   HX, PERSONAL, CERVICAL DYSPLASIA 07/05/2007   12:00 PM, 11/30/21 Josue Hector PT DPT  Physical Therapist with [facility]  [phone] [place] [address] [place], [place], [address] Phone: [phone]   Fax:  [phone]  Name: [name] MRN: [id number] Date of Birth: 01-Feb-1961

## 2021-12-02 ENCOUNTER — Encounter (HOSPITAL_COMMUNITY): Payer: No Typology Code available for payment source

## 2021-12-04 ENCOUNTER — Other Ambulatory Visit: Payer: Self-pay

## 2021-12-04 ENCOUNTER — Ambulatory Visit (HOSPITAL_COMMUNITY): Payer: No Typology Code available for payment source | Admitting: Physical Therapy

## 2021-12-04 ENCOUNTER — Encounter (HOSPITAL_COMMUNITY): Payer: Self-pay | Admitting: Physical Therapy

## 2021-12-04 DIAGNOSIS — Z9889 Other specified postprocedural states: Secondary | ICD-10-CM | POA: Diagnosis not present

## 2021-12-04 DIAGNOSIS — M25562 Pain in left knee: Secondary | ICD-10-CM

## 2021-12-04 DIAGNOSIS — R262 Difficulty in walking, not elsewhere classified: Secondary | ICD-10-CM

## 2021-12-04 NOTE — Therapy (Signed)
[place], [place], [address] Phone: [phone]   Fax:  [phone]  Physical Therapy Treatment  Patient Details  Name: [name] MRN: [id number] Date of Birth: 1960-11-09 Referring Provider (PT): Nicholes Stairs, MD   Encounter Date: 12/04/2021   PT End of Session - 12/04/21 1129     Visit Number 3    Number of Visits 18    Date for PT Re-Evaluation 01/08/22    Authorization Type AETNA; [place] Preferred    Authorization - Visit Number 3    Authorization - Number of Visits 90    Progress Note Due on Visit 10    PT Start Time 1125   late arrival   PT Stop Time 1158    PT Time Calculation (min) 33 min    Activity Tolerance Patient tolerated treatment well    Behavior During Therapy [facility] for tasks assessed/performed             Past Medical History:  Diagnosis Date   Anxiety    Arthritis    Breast cancer ([place])    rt breast  and left breast breast removed   Breast reconstruction deformity    Complication of anesthesia    shaking-after mastectomies-resp and hr dropped   Depression, major    suicidal thoughts -implants, mental health, 05/11   Fibromyalgia    Headache(784.0)    Hypersomnia    sleep attacks   Hypothyroidism    Migraine headache    Narcolepsy    Neoplasm    malignant breast, history of status post bilateral   Post-operative nausea and vomiting    Sleep apnea    CPAP   Sleep apnea with use of continuous positive airway pressure (CPAP)    AHI 20 in HST, titration to 6 cm water - residual AHi 6 . remaining hypersomnic.     Past Surgical History:  Procedure Laterality Date   ARTHRODESIS METATARSALPHALANGEAL JOINT (MTPJ) Right 07/28/2017   Procedure: ARTHRODESIS  RIGHT HALLUX METATARSALPHALANGEAL JOINT (MTPJ);  Surgeon: Wylene Simmer, MD;  Location: [place];  Service: Orthopedics;  Laterality: Right;   Breast reconstructioin  7/09   flaps-bilat  mastectomies   BTL     CARPAL TUNNEL RELEASE  2011   both hands   CARPOMETACARPEL SUSPENSION PLASTY Right 08/29/2014   Procedure: REMOVAL DERMASPAN SUSPENSIONPLASTY RIGHT THUMB ABDUCTOR POLLIS TRANSFER;  Surgeon: Daryll Brod, MD;  Location: [place];  Service: Orthopedics;  Laterality: Right;   CATARACT EXTRACTION W/PHACO Right 12/17/2019   Procedure: CATARACT EXTRACTION PHACO AND INTRAOCULAR LENS PLACEMENT (IOC);  Surgeon: Baruch Goldmann, MD;  Location: [facility];  Service: Ophthalmology;  Laterality: Right;  CDE: 6.54   CATARACT EXTRACTION W/PHACO Left 12/31/2019   Procedure: CATARACT EXTRACTION PHACO AND INTRAOCULAR LENS PLACEMENT (IOC);  Surgeon: Baruch Goldmann, MD;  [facility];  Service: Ophthalmology;  Laterality: Left;  CDE: 3.66   COLONOSCOPY W/ POLYPECTOMY  4/03   tubulovillous adenoma   FINGER ARTHROSCOPY WITH CARPOMETACARPEL ([place]) ARTHROPLASTY Right 06/20/2014   Procedure: ARTHROSCOPY RIGHT THUMB CARPOMETACARPEL GRAFT JACKET INTERPOSITION PARTIAL TRAPEZIECTOMY POSSIBLE OPEN;  Surgeon: Daryll Brod, MD;  Location: [place];  Service: Orthopedics;  Laterality: Right;   GANGLION CYST EXCISION     both [id number]   HAMMER TOE SURGERY Right 07/28/2017   Procedure: THIRD HAMMERTOE CORRECTION;  Surgeon: Wylene Simmer, MD;  Location: [place];  Service: Orthopedics;  Laterality: Right;   HARDWARE REMOVAL  Right 07/28/2017   Procedure: REMOVAL OF DEEP IMPLANTS;  Surgeon: Wylene Simmer, MD;  Location: [place];  Service: Orthopedics;  Laterality: Right;   hysterestomy  2005   MASTECTOMY Bilateral 2008   post- a -cath  2008   right knee arthroscopy     TOTAL SHOULDER ARTHROPLASTY Left 01/13/2017   Procedure: LEFT TOTAL SHOULDER ARTHROPLASTY;  Surgeon: Tania Ade, MD;  Location: [place];  Service: Orthopedics;  Laterality: Left;  LEFT TOTAL SHOULDER ARTHROPLASTY   TRIGGER FINGER RELEASE Right 08/29/2014   Procedure: RELEASE TRIGGER  FINGER/A-1 PULLEY;  Surgeon: Daryll Brod, MD;  Location: [place];  Service: Orthopedics;  Laterality: Right;   wisdom teeth out      There were no vitals filed for this visit.   Subjective Assessment - 12/04/21 1128     Subjective Patient says she is doing a little better today. She had follow up with ortho and was given stronger pain meds which has helped. She is doing better with HEP.    Limitations Sitting;Walking;Standing;Lifting;House hold activities    Patient Stated Goals Return to walking without an AD or pain.    Currently in Pain? Yes    Pain Score 5     Pain Location Knee    Pain Orientation Left    Pain Descriptors / Indicators Burning;Throbbing    Pain Type Surgical pain    Pain Onset 1 to 4 weeks ago                               [facility] Adult PT Treatment/Exercise - 12/04/21 0001       Knee/Hip Exercises: Seated   Long Arc Quad Left;15 reps      Knee/Hip Exercises: Supine   Quad Sets Left;1 set;15 reps    [employer] Limitations 5" hold    Short Arc Quad Sets Left;2 sets;10 reps    Short Arc Quad Sets Limitations 3 sec holds    Heel Slides Left;1 set;15 reps    Bridges 15 reps    Straight Leg Raises Left;2 sets;10 reps    Knee Extension AROM;Left    Knee Extension Limitations -2    Knee Flexion AROM;Left    Knee Flexion Limitations 85    Other Supine Knee/Hip Exercises glute set 15 x 5"      Manual Therapy   Manual Therapy Joint mobilization    Manual therapy comments Completed separately from therapeutic exercises    Joint Mobilization Grade I LT patellar mobs in all planes for decreased pain and improved ROM                       PT Short Term Goals - 11/27/21 1224       PT SHORT TERM GOAL #1   Title Patient wil achieve a L knee extension AROM of at least -3 degrees.    Baseline -6    Target Date 12/18/21      PT SHORT TERM GOAL #2   Title Patient wil achieve a L knee flexion AROM of at least 110  degrees.    Baseline 89    Target Date 12/18/21      PT SHORT TERM GOAL #3   Title Patient will achieve a Lt knee extension MMT of at least 4/5 and without an onset of pain greater than 3/10.    Baseline 3-/5    Target Date 12/18/21  PT Long Term Goals - 11/27/21 1229       PT LONG TERM GOAL #1   Title Patient wil achieve a Lt knee extension AROM of at least 0 degrees.    Baseline -6    Target Date 01/08/22      PT LONG TERM GOAL #2   Title Patient wil achieve a Lt knee flexion AROM of at least 120 degrees.    Baseline 89    Target Date 01/08/22      PT LONG TERM GOAL #3   Title Patient will achieve a Lt knee extension MMT of at least 5/5 and without an onset of pain.    Baseline -3    Target Date 01/08/22      PT LONG TERM GOAL #4   Title Patient will be able to negotiate a normal flight of stairs(10-14 steps) reciprocally with the use of one hand rail and without an onset of Lt knee pain greater than a 1/10.    Target Date 01/08/22      PT LONG TERM GOAL #5   Title Patient will be able to ambulate independently with normalized gait for at least 372ft without an onset of Lt knee pain greater than a 1/10.    Baseline Walker use and step to gait pattern.    Target Date 01/08/22                   Plan - 12/04/21 1153     Clinical Impression Statement Patient with improved tolerance. Still pain limited with end ROM, but improved form last visit. Able to perform LE strength progression from isometrics to active SAQ and LAQs. Added bridging for glute recruitment. Performed manual patellar mobilization for improved AROM and decreased pain. Patient will continue to benefit from skilled therapy services to reduce deficits and improve functional level.    Personal Factors and Comorbidities Comorbidity 1;Behavior Pattern;Age;Fitness    Comorbidities Fibromyalgia    Examination-Participation Restrictions Community Activity;Driving;Laundry;Meal  Prep;Cleaning    Stability/Clinical Decision Making Evolving/Moderate complexity    Rehab Potential Fair    PT Frequency 3x / week    PT Duration 6 weeks    PT Treatment/Interventions ADLs/Self Care Home Management;Electrical Stimulation;DME Instruction;Gait training;Stair training;Functional mobility training;Therapeutic activities;Therapeutic exercise;Balance training;Neuromuscular re-education;Patient/family education;Manual techniques;Passive range of motion;Scar mobilization    PT Next Visit Plan Continue with Lt knee ROM exercises as needed and progress strengthneing exercises as tolerated. Possible scar tissue massage, pending healing. Progress to standing if able.    PT Home Exercise Plan Sitting Heel Slide with Towel - 3-4 x daily - 7 x weekly - 4 reps - 30s hold  Seated Knee Extension Stretch with Chair - 3-4 x daily - 7 x weekly - 4 reps - 30s hold  Supine Quad Set - 2-3 x daily - 7 x weekly - 2 sets - 8 reps - 3s hold  Supine Ankle Pumps - 5 x daily - 7 x weekly - 2 sets - 20 reps  Supine Knee Extension Strengthening - 2-3 x daily - 7 x weekly - 2 sets - 12 reps  Seated Long Arc Quad - 2-3 x daily - 7 x weekly - 2 sets - 8 reps  Standing Knee Flexion AROM with Chair Support - 2-3 x daily - 7 x weekly - 2 sets - 8 reps  Heel rises with counter support - 2-3 x daily - 7 x weekly - 2 sets - 12 reps  Mini Squat with Chair -  2-3 x daily - 7 x weekly - 3 sets - 8 reps    Consulted and Agree with Plan of Care Patient             Patient will benefit from skilled therapeutic intervention in order to improve the following deficits and impairments:  Abnormal gait, Decreased balance, Decreased endurance, Decreased mobility, Difficulty walking, Hypomobility, Decreased activity tolerance, Decreased knowledge of use of DME, Decreased range of motion, Decreased strength, Increased edema  Visit Diagnosis: S/P medial meniscectomy of left knee  Difficulty walking  Left knee pain, unspecified  chronicity     Problem List Patient Active Problem List   Diagnosis Date Noted   Osteoarthritis of left shoulder 01/13/2017   S/P shoulder replacement, left 01/13/2017   OSA on CPAP 04/01/2015   Subacute confusional state 04/01/2015   Hallucinations, visual 04/01/2015   Hypnapompic hallucinations 04/01/2015   Sleep apnea with use of continuous positive airway pressure (CPAP)    Hypersomnia, persistent 03/30/2013   Obstructive sleep apnea (adult) (pediatric) 03/30/2013   CONSTIPATION 11/18/2008   RECTAL BLEEDING 11/18/2008   HEMORRHOIDS, INTERNAL 11/15/2008   DIVERTICULOSIS, COLON 11/15/2008   CELLULITIS/ABSCESS, TRUNK 02/22/2008   LOW BACK PAIN 02/22/2008   DIZZINESS 02/22/2008   FATIGUE 02/22/2008   PALPITATIONS 02/22/2008   MURMUR, CARDIAC, UNDIAGNOSED 02/22/2008   BREAST CANCER, HX OF 02/22/2008   COUGH 01/05/2008   DEPRESSION 07/05/2007   UTERINE POLYP 07/05/2007   FIBROMYALGIA 07/05/2007   COLONIC POLYPS, HX OF 07/05/2007   HX, PERSONAL, CERVICAL DYSPLASIA 07/05/2007   12:00 PM, 12/04/21 Josue Hector PT DPT  Physical Therapist with [facility]  [phone] [place] [address] [place], [place], [address] Phone: [phone]   Fax:  [phone]  Name: [name] MRN: [id number] Date of Birth: 10/29/1961

## 2021-12-07 ENCOUNTER — Ambulatory Visit (HOSPITAL_COMMUNITY): Payer: No Typology Code available for payment source

## 2021-12-09 ENCOUNTER — Ambulatory Visit (HOSPITAL_COMMUNITY): Payer: No Typology Code available for payment source | Admitting: Physical Therapy

## 2021-12-11 ENCOUNTER — Encounter (HOSPITAL_COMMUNITY): Payer: No Typology Code available for payment source | Admitting: Physical Therapy

## 2021-12-14 ENCOUNTER — Encounter (HOSPITAL_COMMUNITY): Payer: No Typology Code available for payment source

## 2021-12-16 ENCOUNTER — Encounter (HOSPITAL_COMMUNITY): Payer: No Typology Code available for payment source | Admitting: Physical Therapy

## 2021-12-18 ENCOUNTER — Ambulatory Visit (HOSPITAL_COMMUNITY): Payer: No Typology Code available for payment source | Admitting: Physical Therapy

## 2021-12-21 ENCOUNTER — Encounter (HOSPITAL_COMMUNITY): Payer: No Typology Code available for payment source | Admitting: Physical Therapy

## 2021-12-23 ENCOUNTER — Encounter (HOSPITAL_COMMUNITY): Payer: No Typology Code available for payment source | Admitting: Physical Therapy

## 2021-12-25 ENCOUNTER — Encounter (HOSPITAL_COMMUNITY): Payer: No Typology Code available for payment source | Admitting: Physical Therapy

## 2022-02-05 NOTE — Pre-Procedure Instructions (Addendum)
Surgical Instructions ? ? ? Your procedure is scheduled on Thursday 02/11/22. ? ? Report to [facility] Main Entrance "A" at 05:30 A.M., then check in with the Admitting office. ? Call this number if you have problems the morning of surgery: ? [phone] ? ? If you have any questions prior to your surgery date call [phone]: Open Monday-Friday 8am-4pm ? ? ? Remember: ? Do not eat after midnight the night before your surgery ? ?You may drink clear liquids until 04:30 A.M. the morning of your surgery.   ?Clear liquids allowed are: Water, Non-Citrus Juices (without pulp), Carbonated Beverages, Clear Tea, Black Coffee ONLY (NO MILK, CREAM OR POWDERED CREAMER of any kind), and Gatorade ? ? ?Enhanced Recovery after Surgery for Orthopedics ?Enhanced Recovery after Surgery is a protocol used to improve the stress on your body and your recovery after surgery. ? ?Patient Instructions ? ?The day of surgery (if you do NOT have diabetes):  ?Drink ONE (1) Pre-Surgery Clear Ensure by 04:30 am the morning of surgery   ?This drink was given to you during your hospital  ?pre-op appointment visit. ?Nothing else to drink after completing the  ?Pre-Surgery Clear Ensure. ? ?       If you have questions, please contact your surgeon?s office.  ?  ? Take these medicines the morning of surgery with A SIP OF WATER:  ? ARIPiprazole (ABILIFY) ? escitalopram (LEXAPRO)  ? pregabalin (LYRICA) ? propranolol ER (INDERAL LA) ? Tapentadol  ? ? Take these medicines if needed:  ? cyclobenzaprine (FLEXERIL) ? traMADol Veatrice Bourbon)  ? ? ?As of today, STOP taking any Aspirin (unless otherwise instructed by your surgeon) Aleve, Naproxen, Ibuprofen, Motrin, Advil, Goody's, BC's, all herbal medications, fish oil, diclofenac sodium (VOLTAREN), and all vitamins. ? ? ?Oral Hygiene is also important to reduce your risk of infection.  Remember - BRUSH YOUR TEETH THE MORNING OF SURGERY WITH YOUR REGULAR TOOTHPASTE ? ?[facility]- Preparing for Total Shoulder  Arthroplasty ? ?Before surgery, you can play an important role. Because skin is not sterile, your skin needs to be as free of germs as possible. You can reduce the number of germs on your skin by using the following products. ? ?? Benzoyl Peroxide Gel ? ?o Reduces the number of germs present on the skin ? ?o Applied twice a day to shoulder area starting two days before surgery ? ?? Chlorhexidine Gluconate (CHG) Soap (instructions listed above on how to wash with CHG Soap) ? ?o An antiseptic cleaner that kills germs and bonds with the skin to continue killing germs even after washing ? ?o Used for showering the night before surgery and morning of surgery ? ? ?================================================================== ? ?Please follow these instructions carefully: ? ?BENZOYL PEROXIDE 5% GEL ? ?Please do not use if you have an allergy to benzoyl peroxide. If your skin becomes reddened/irritated stop using the benzoyl peroxide. ? ?Starting two days before surgery, apply as follows: ? ?1. Apply benzoyl peroxide in the morning and at night. Apply after taking a shower. If you are not taking a shower clean entire shoulder front, back, and side along with the armpit with a clean wet washcloth. ? ?2. Place a quarter-sized dollop on your SHOULDER and rub in thoroughly, making sure to cover the front, back, and side of your shoulder, along with the armpit. ? ? 2 Days prior to Surgery ?First Dose on Tuesday 02/09/22 Morning ?Second Dose on Tuesday 02/09/22 Night ? ?Day Before Surgery ?First Dose on Wednesday 02/10/22 Morning ?  Night before surgery wash (entire body except face and private areas) with CHG Soap THEN ?Second Dose on Wednesday 02/10/22 Night  ? ?Morning of Surgery  ?wash BODY AGAIN with CHG Soap ? ? ?4. Do NOT apply benzoyl peroxide gel on the day of surgery ? ? ?[facility]- Preparing For Surgery ? ?Before surgery, you can play an important role. Because skin is not sterile, your skin needs to be as free of germs  as possible. You can reduce the number of germs on your skin by washing with CHG (chlorahexidine gluconate) Soap before surgery.  CHG is an antiseptic cleaner which kills germs and bonds with the skin to continue killing germs even after washing.   ? ? ?Please do not use if you have an allergy to CHG or antibacterial soaps. If your skin becomes reddened/irritated stop using the CHG.  ?Do not shave (including legs and underarms) for at least 48 hours prior to first CHG shower. It is OK to shave your face. ? ?Please follow these instructions carefully. ?  ? ? Shower the NIGHT BEFORE SURGERY and the MORNING OF SURGERY with CHG Soap.  ? If you chose to wash your hair, wash your hair first as usual with your normal shampoo. After you shampoo, rinse your hair and body thoroughly to remove the shampoo.  Then [employer] and genitals (private parts) with your normal soap and rinse thoroughly to remove soap. ? ?After that Use CHG Soap as you would any other liquid soap. You can apply CHG directly to the skin and wash gently with a scrungie or a clean washcloth.  ? ?Apply the CHG Soap to your body ONLY FROM THE NECK DOWN.  Do not use on open wounds or open sores. Avoid contact with your eyes, ears, mouth and genitals (private parts). Wash Face and genitals (private parts)  with your normal soap.  ? ?Wash thoroughly, paying special attention to the area where your surgery will be performed. ? ?Thoroughly rinse your body with warm water from the neck down. ? ?DO NOT shower/wash with your normal soap after using and rinsing off the CHG Soap. ? ?Pat yourself dry with a CLEAN TOWEL. ? ?8. Apply the Benzoyl Peroxide only the night before surgery.  Do Not use it the morning of surgery. ? ?Wear CLEAN PAJAMAS to bed the night before surgery ? ?Place CLEAN SHEETS on your bed the night before your surgery ? ?DO NOT SLEEP WITH PETS. ? ? ?Day of Surgery: ?Take a shower with CHG soap. ?Wear Clean/Comfortable clothing the morning of  surgery ?Do not apply any deodorants/lotions.   ?Remember to brush your teeth WITH YOUR REGULAR TOOTHPASTE. ?  ?Please read over the following fact sheets that you were given.  ? ? ?   ? ?         ?Do not wear jewelry or makeup ?Do not wear lotions, powders, perfumes/colognes, or deodorant. ?Do not shave 48 hours prior to surgery.  Men may shave face and neck. ?Do not bring valuables to the hospital. ?Do not wear nail polish, gel polish, artificial nails, or any other type of covering on natural nails (fingers and toes) ?If you have artificial nails or gel coating that need to be removed by a nail salon, please have this removed prior to surgery. Artificial nails or gel coating may interfere with anesthesia's ability to adequately monitor your vital signs. ? ?[facility] is not responsible for any belongings or valuables. .  ? ?Do NOT Smoke (  Tobacco/Vaping)  24 hours prior to your procedure ? ?If you use a CPAP at night, you may bring your mask for your overnight stay. ?  ?Contacts, glasses, hearing aids, dentures or partials may not be worn into surgery, please bring cases for these belongings ?  ?For patients admitted to the hospital, discharge time will be determined by your treatment team. ?  ?Patients discharged the day of surgery will not be allowed to drive home, and someone needs to stay with them for 24 hours. ? ? ?SURGICAL WAITING ROOM VISITATION ?Patients having surgery or a procedure in a hospital may have two support people. ?Children under the age of 55 must have an adult with them who is not the patient. ?They may stay in the waiting area during the procedure and may switch out with other visitors. If the patient needs to stay at the hospital during part of their recovery, the visitor guidelines for inpatient rooms apply. ? ?Please refer to the [facility] website for the visitor guidelines for Inpatients (after your surgery is over and you are in a regular room).  ? ? ? ? ?Day of Surgery: ? ?Take a  shower with CHG soap. ?Wear Clean/Comfortable clothing the morning of surgery ?Do not apply any deodorants/lotions.   ?Remember to brush your teeth WITH YOUR REGULAR TOOTHPASTE. ? ? ? ?If you received a COVID test durin

## 2022-02-08 ENCOUNTER — Other Ambulatory Visit: Payer: Self-pay

## 2022-02-08 ENCOUNTER — Encounter (HOSPITAL_COMMUNITY)
Admission: RE | Admit: 2022-02-08 | Discharge: 2022-02-08 | Disposition: A | Discharge status: Home / Self Care | Payer: No Typology Code available for payment source | Source: Ambulatory Visit | Attending: Orthopedic Surgery | Admitting: Orthopedic Surgery

## 2022-02-08 ENCOUNTER — Encounter (HOSPITAL_COMMUNITY): Payer: Self-pay

## 2022-02-08 VITALS — BP 109/72 | HR 88 | Temp 98.2°F | Resp 18 | Ht 64.0 in | Wt 164.1 lb

## 2022-02-08 DIAGNOSIS — Z01818 Encounter for other preprocedural examination: Secondary | ICD-10-CM | POA: Diagnosis not present

## 2022-02-08 DIAGNOSIS — Z9013 Acquired absence of bilateral breasts and nipples: Secondary | ICD-10-CM | POA: Diagnosis not present

## 2022-02-08 DIAGNOSIS — M19011 Primary osteoarthritis, right shoulder: Secondary | ICD-10-CM | POA: Diagnosis not present

## 2022-02-08 DIAGNOSIS — Z87891 Personal history of nicotine dependence: Secondary | ICD-10-CM | POA: Insufficient documentation

## 2022-02-08 DIAGNOSIS — E039 Hypothyroidism, unspecified: Secondary | ICD-10-CM | POA: Diagnosis not present

## 2022-02-08 DIAGNOSIS — M797 Fibromyalgia: Secondary | ICD-10-CM | POA: Insufficient documentation

## 2022-02-08 DIAGNOSIS — G47419 Narcolepsy without cataplexy: Secondary | ICD-10-CM | POA: Diagnosis not present

## 2022-02-08 DIAGNOSIS — G4733 Obstructive sleep apnea (adult) (pediatric): Secondary | ICD-10-CM | POA: Insufficient documentation

## 2022-02-08 LAB — BASIC METABOLIC PANEL
Anion gap: 10 (ref 5–15)
BUN: 19 mg/dL (ref 8–23)
CO2: 29 mmol/L (ref 22–32)
Calcium: 9.8 mg/dL (ref 8.9–10.3)
Chloride: 100 mmol/L (ref 98–111)
Creatinine, Ser: 0.94 mg/dL (ref 0.44–1.00)
GFR, Estimated: >60 mL/min (ref >60)
Glucose, Bld: 101 mg/dL — ABNORMAL HIGH (ref 70–99)
Potassium: 3.8 mmol/L (ref 3.5–5.1)
Sodium: 139 mmol/L (ref 135–145)

## 2022-02-08 LAB — CBC
HCT: 42.8 % (ref 36.0–46.0)
Hemoglobin: 14 g/dL (ref 12.0–15.0)
MCH: 28.2 pg (ref 26.0–34.0)
MCHC: 32.7 g/dL (ref 30.0–36.0)
MCV: 86.1 fL (ref 80.0–100.0)
Platelets: 282 10*3/uL (ref 150–400)
RBC: 4.97 MIL/uL (ref 3.87–5.11)
RDW: 14 % (ref 11.5–15.5)
WBC: 8.7 10*3/uL (ref 4.0–10.5)
nRBC: 0 % (ref 0.0–0.2)

## 2022-02-08 LAB — SURGICAL PCR SCREEN
MRSA, PCR: NEGATIVE
Staphylococcus aureus: POSITIVE — AB

## 2022-02-08 NOTE — Progress Notes (Signed)
PCP - Dr. Dione Housekeeper ? ?Cardiologist - Denies ? ?EP- Denies ? ?Endocrine- Denies ? ?Pulm- Denies ? ?Chest x-ray - Denies ? ?EKG - 02/08/22 ? ?Stress Test - Denies ? ?ECHO - Denies ? ?Cardiac Cath - Denies ? ?AICD-na ?PM-na ?LOOP-na ? ?Nerve Stimulator- Denies ? ?Dialysis- Denies ? ?Sleep Study - Yes- Positive ?CPAP - Yes- will bring day of surgery ? ?LABS- 02/08/22: CBC, BMP, PCR ? ?ASA- Denies ? ?ERAS- Yes- 1 Ensure given ? ?HA1C- Denies ? ?Anesthesia-Yes- EKG ? ?Pt denies having chest pain, sob, or fever at this time. All instructions explained to the pt, with a verbal understanding of the material. Pt agrees to go over the instructions while at home for a better understanding. Pt also instructed to wear a mask and social distance if she goes out. The opportunity to ask questions was provided.  ?

## 2022-02-09 NOTE — Progress Notes (Addendum)
Anesthesia Chart Review: ? Case: [id number] Date/Time: 02/11/22 0715  ? Procedure: TOTAL SHOULDER ARTHROPLASTY (Right: Shoulder) - 150  ? Anesthesia type: Choice  ? Pre-op diagnosis: Right shoulder osteoarthritis  ? Location: [facility] OR ROOM 06 / [facility]  ? Surgeons: Nicholes Stairs, MD  ? ?  ? ? ?DISCUSSION: Patient is a 61 year old female scheduled for the above procedure. ? ?History includes former smoker (quit 06/14/12), post-operative N/V, fibromyalgia, hypothyroidism, OSA (uses CPAP), narcolepsy, breast cancer (right breast cancer, s/p Port-a-cath 07/21/07-10/30/07 for chemotherapy, s/p right modified radical mastectomy, prophylactic left total mastectomy 10/30/07; breast reconstruction 05/2008), depression, anxiety, vaginal hysterectomy (02/11/05, for endometriosis), arthritis (s/p left total shoulder 01/13/17), cataracts (s/p right extraction 12/17/19 & left 12/11/19).  ? ?Per previous anesthesia APP review, anesthesia history included reported history of respirations and HR dropping and felt "shaky" after mastectomy in 2008. Surgeon's notes and anesthesia notes from 10/30/07 surgery did not indicate any post-operative complications.  ? ?Her 02/08/22 EKG ordered by Dr. Stann Mainland showed new left bundle branch block since 01/07/17 EKG. It does not look like she had a preoperative EKG prior to 2021 cataract surgeries. She denied seeing a cardiologist or having cardiac testing--last echo found was in 2009 post chemotherapy for breast cancer. Given new finding LBBB since 2018 without known cardiac testing since 2009, would recommend preoperative cardiology evaluation for elective surgery. Carrie at Dr. Dennie Maizes office notified.  ? ? ?ADDENDUM 02/10/22 4:25 PM: ?Patient had cardiology evaluation by Rozann Lesches, MD this afternoon for preoperative cardiac evaluation. He wrote, "She describes activities meeting or exceeding 4 METS without angina or unusual shortness of breath, has no palpitations or unexplained syncope.  Main issue is  excluding undiagnosed cardiomyopathy and we will see if we can get an echocardiogram added on for today.  No clear indication for ischemic testing at this time.  RCRI cardiac risk calculator based on available information indicates class I, 0.4% chance of major adverse cardiac event.  If her echocardiogram shows normal LVEF without regional wall motion abnormalities to suggest further ischemic work-up, she should be able to proceed with right shoulder shoulder arthroplasty under general anesthesia as scheduled." Following 02/10/22 echo, Dr. Domenic Polite added, "Echocardiogram shows low normal LVEF at 50 to 55% with septal motion consistent with left bundle branch block.  No significant valvular abnormalities.  She should be able to proceed with planned surgery as per consultation note." ? ? ?VS: BP 109/72   Pulse 88   Temp 36.8 ?C (Oral)   Resp 18   Ht '5\' 4"'$  (1.626 m)   Wt 74.4 kg   SpO2 100%   BMI 28.17 kg/m?  ? ? ?PROVIDERS: ?Ginger Organ., MD is PCP ?Bo Merino, MD is rheumatologist.  Evaluation on 01/14/2021 for polyarthralgia. ?Dohmeier, Asencion Partridge, MD is neurologist (for OSA) ? ? ?LABS: Labs reviewed: Acceptable for surgery. ?(all labs ordered are listed, but only abnormal results are displayed) ? ?Labs Reviewed  ?SURGICAL PCR SCREEN - Abnormal; Notable for the following components:  ?    Result Value  ? Staphylococcus aureus POSITIVE (*)   ? All other components within normal limits  ?BASIC METABOLIC PANEL - Abnormal; Notable for the following components:  ? Glucose, Bld 101 (*)   ? All other components within normal limits  ?CBC  ? ? ?EKG: 02/08/22: ?Normal sinus rhythm ?Left bundle branch block ?Abnormal ECG ?When compared with ECG of 07-Jan-2017 15:44, ?PREVIOUS ECG IS PRESENT LBBB is new. ?Confirmed by Rudean Haskell [phone]) on 02/08/2022 4:21:45 PM ? ? ?  CV: ?Echo 02/10/22:  ?IMPRESSIONS  ? 1. Left ventricular ejection fraction, by estimation, is 50 to 55%. The  ?left ventricle has low normal  function. The left ventricle has no regional  ?wall motion abnormalities. Septal motion consistent with left bundle  ?branch block. Left ventricular  ?diastolic parameters are consistent with Grade I diastolic dysfunction  ?(impaired relaxation).  ? 2. Right ventricular systolic function is normal. The right ventricular  ?size is normal. Tricuspid regurgitation signal is inadequate for assessing  ?PA pressure.  ? 3. The mitral valve is grossly normal. No evidence of mitral valve  ?regurgitation.  ? 4. The aortic valve is tricuspid. Aortic valve regurgitation is not  ?visualized.  ? 5. The inferior vena cava is normal in size with greater than 50%  ?respiratory variability, suggesting right atrial pressure of 3 mmHg.  ?- Comparison(s): Prior images unable to be directly viewed.  ? ?Denied prior stress test or cardiac cath. ? ? ?Past Medical History:  ?Diagnosis Date  ? Anxiety   ? Arthritis   ? Breast cancer ([place])   ? rt breast  and left breast breast removed  ? Breast reconstruction deformity   ? Complication of anesthesia   ? shaking-after mastectomies-resp and hr dropped  ? Depression, major   ? suicidal thoughts -implants, mental health, 05/11  ? Fibromyalgia   ? Headache(784.0)   ? Hypersomnia   ? sleep attacks  ? Hypothyroidism   ? Migraine headache   ? Narcolepsy   ? Neoplasm   ? malignant breast, history of status post bilateral  ? Post-operative nausea and vomiting   ? Sleep apnea   ? CPAP  ? Sleep apnea with use of continuous positive airway pressure (CPAP)   ? AHI 20 in HST, titration to 6 cm water - residual AHi 6 . remaining hypersomnic.   ? ? ?Past Surgical History:  ?Procedure Laterality Date  ? ARTHRODESIS METATARSALPHALANGEAL JOINT (MTPJ) Right 07/28/2017  ? Procedure: ARTHRODESIS  RIGHT HALLUX METATARSALPHALANGEAL JOINT (MTPJ);  Surgeon: Wylene Simmer, MD;  Location: [facility];  Service: Orthopedics;  Laterality: Right;  ? Breast reconstructioin  7/09  ? flaps-bilat mastectomies  ? BTL     ? CARPAL TUNNEL RELEASE  2011  ? both hands  ? CARPOMETACARPEL SUSPENSION PLASTY Right 08/29/2014  ? Procedure: REMOVAL DERMASPAN SUSPENSIONPLASTY RIGHT THUMB ABDUCTOR POLLIS TRANSFER;  Surgeon: Daryll Brod, MD;  Location: [facility];  Service: Orthopedics;  Laterality: Right;  ? CATARACT EXTRACTION W/PHACO Right 12/17/2019  ? Procedure: CATARACT EXTRACTION PHACO AND INTRAOCULAR LENS PLACEMENT (IOC);  Surgeon: Baruch Goldmann, MD;  Location: [facility];  Service: Ophthalmology;  Laterality: Right;  CDE: 6.54  ? CATARACT EXTRACTION W/PHACO Left 12/31/2019  ? Procedure: CATARACT EXTRACTION PHACO AND INTRAOCULAR LENS PLACEMENT (IOC);  Surgeon: Baruch Goldmann, MD;  Location: [facility];  Service: Ophthalmology;  Laterality: Left;  CDE: 3.66  ? COLONOSCOPY W/ POLYPECTOMY  4/03  ? tubulovillous adenoma  ? FINGER ARTHROSCOPY WITH CARPOMETACARPEL [facility]) ARTHROPLASTY Right 06/20/2014  ? Procedure: ARTHROSCOPY RIGHT THUMB CARPOMETACARPEL GRAFT JACKET INTERPOSITION PARTIAL TRAPEZIECTOMY POSSIBLE OPEN;  Surgeon: Daryll Brod, MD;  Location: [facility];  Service: Orthopedics;  Laterality: Right;  ? GANGLION CYST EXCISION    ? both [phone]  ? HAMMER TOE SURGERY Right 07/28/2017  ? Procedure: THIRD HAMMERTOE CORRECTION;  Surgeon: Wylene Simmer, MD;  Location: [facility];  Service: Orthopedics;  Laterality: Right;  ? HARDWARE REMOVAL Right 07/28/2017  ? Procedure: REMOVAL OF DEEP IMPLANTS;  Surgeon: Wylene Simmer, MD;  Location: [facility];  Service: Orthopedics;  Laterality: Right;  ? hysterestomy  2005  ? MASTECTOMY Bilateral 2008  ? post- a -cath  2008  ? right knee arthroscopy    ? TOTAL SHOULDER ARTHROPLASTY Left 01/13/2017  ? Procedure: LEFT TOTAL SHOULDER ARTHROPLASTY;  Surgeon: Tania Ade, MD;  Location: [facility];  Service: Orthopedics;  Laterality: Left;  LEFT TOTAL SHOULDER ARTHROPLASTY  ? TRIGGER FINGER RELEASE Right 08/29/2014  ? Procedure: RELEASE TRIGGER FINGER/A-1 PULLEY;   Surgeon: Daryll Brod, MD;  Location: [facility];  Service: Orthopedics;  Laterality: Right;  ? wisdom teeth out    ? ? ?MEDICATIONS: ? busPIRone (BUSPAR) 10 MG tablet  ? methocarbamol (ROBAXIN) 500

## 2022-02-10 ENCOUNTER — Ambulatory Visit (INDEPENDENT_AMBULATORY_CARE_PROVIDER_SITE_OTHER): Payer: No Typology Code available for payment source

## 2022-02-10 ENCOUNTER — Ambulatory Visit (INDEPENDENT_AMBULATORY_CARE_PROVIDER_SITE_OTHER): Payer: No Typology Code available for payment source | Admitting: Cardiology

## 2022-02-10 ENCOUNTER — Encounter: Payer: Self-pay | Admitting: Cardiology

## 2022-02-10 ENCOUNTER — Telehealth: Payer: Self-pay | Admitting: Cardiology

## 2022-02-10 VITALS — BP 92/52 | HR 86 | Ht 64.0 in | Wt 164.4 lb

## 2022-02-10 DIAGNOSIS — Z01810 Encounter for preprocedural cardiovascular examination: Secondary | ICD-10-CM

## 2022-02-10 DIAGNOSIS — I447 Left bundle-branch block, unspecified: Secondary | ICD-10-CM

## 2022-02-10 LAB — ECHOCARDIOGRAM COMPLETE
Area-P 1/2: 5.84 cm2
Height: 64 in
S' Lateral: 3.33 cm
Weight: 2630.4 oz

## 2022-02-10 NOTE — Telephone Encounter (Signed)
Patient informed. Copy sent to PCP °

## 2022-02-10 NOTE — Anesthesia Preprocedure Evaluation (Addendum)
Anesthesia Evaluation  ? ? ?Airway ?Mallampati: I ? ? ? ? ? ? Dental ?no notable dental hx. ? ?  ?Pulmonary ?sleep apnea and Continuous Positive Airway Pressure Ventilation , former smoker,  ?  ?Pulmonary exam normal ? ? ? ? ? ? ? Cardiovascular ?negative cardio ROS ?Normal cardiovascular exam ? ?Echo 02/10/22:  ?IMPRESSIONS  ??1. Left ventricular ejection fraction, by estimation, is 50 to 55%. The  ?left ventricle has low normal function. The left ventricle has no regional  ?wall motion abnormalities. Septal motion consistent with left bundle  ?branch block. Left ventricular  ?diastolic parameters are consistent with Grade I diastolic dysfunction  ?(impaired relaxation).  ??2. Right ventricular systolic function is normal. The right ventricular  ?size is normal. Tricuspid regurgitation signal is inadequate for assessing  ?PA pressure.  ??3. The mitral valve is grossly normal. No evidence of mitral valve  ?regurgitation.  ??4. The aortic valve is tricuspid. Aortic valve regurgitation is not  ?visualized.  ??5. The inferior vena cava is normal in size with greater than 50%  ?respiratory variability, suggesting right atrial pressure of 3 mmHg.  ?- Comparison(s): Prior images unable to be directly viewed.  ? ? ?  ?Neuro/Psych ? Headaches, PSYCHIATRIC DISORDERS Anxiety Depression  Neuromuscular disease   ? GI/Hepatic ?negative GI ROS, Neg liver ROS,   ?Endo/Other  ? ? Renal/GU ?negative Renal ROS  ?negative genitourinary ?  ?Musculoskeletal ? ?(+) Fibromyalgia - ? Abdominal ?Normal abdominal exam  (+)   ?Peds ? Hematology ?negative hematology ROS ?(+)   ?Anesthesia Other Findings ? ? Reproductive/Obstetrics ? ?  ? ? ? ? ? ? ? ? ? ? ? ? ? ?  ?  ? ? ? ? ? ? ? ?Anesthesia Physical ?Anesthesia Plan ? ?ASA: 2 ? ?Anesthesia Plan: General  ? ?Post-op Pain Management: Regional block*  ? ?Induction: Intravenous ? ?PONV Risk Score and Plan: 3 and Ondansetron, Dexamethasone and Midazolam ? ?Airway  Management Planned: Oral ETT ? ?Additional Equipment: None ? ?Intra-op Plan:  ? ?Post-operative Plan: Extubation in OR ? ?Informed Consent: I have reviewed the patients History and Physical, chart, labs and discussed the procedure including the risks, benefits and alternatives for the proposed anesthesia with the patient or authorized representative who has indicated his/her understanding and acceptance.  ? ? ? ?Dental advisory given ? ?Plan Discussed with: CRNA ? ?Anesthesia Plan Comments: (PAT note written by Myra Gianotti, PA-C. ?)  ? ? ? ? ? ?Anesthesia Quick Evaluation ? ?

## 2022-02-10 NOTE — Patient Instructions (Addendum)

## 2022-02-10 NOTE — Telephone Encounter (Signed)
Call answered but was put on hold for 4 minutes so hung up ?Will retry later ?

## 2022-02-10 NOTE — Progress Notes (Signed)
? ? ?Cardiology Office Note ? ?Date: 02/10/2022  ? ?ID: [name], DOB February 11, 1961, MRN [id number] ? ?PCP:  Ginger Organ., MD  ?Cardiologist:  Rozann Lesches, MD ?Electrophysiologist:  None  ? ?Chief Complaint  ?Patient presents with  ? Preoperative cardiac evaluation  ? ? ?History of Present Illness: ?[name] is a 61 y.o. female referred for cardiology consultation by Ms. Ulice Brilliant PA-C for preoperative cardiac assessment.  I reviewed the available records.  She was seen for pre-anesthesia work-up on April 3 and found to have a left bundle branch block by ECG (new in comparison to tracing from 2018).  He is scheduled to undergo right total shoulder arthroplasty on April 6. ? ?She has a history of breast cancer status post chemotherapy (I cannot locate type per review of the chart), and ultimately bilateral mastectomy.  She does not recall any cardiac complications in the past and did have echocardiograms several years ago during this time. ? ?At baseline she reports NYHA class I-II dyspnea.  She is limited by significant arthritic pain, but is able to achieve levels of activity meeting or exceeding 4 METS.  She has no known history of ischemic heart disease, does not describe exertional angina, has had no palpitations or sudden syncope. ? ?I did review her recent ECG as well as the one from 2018, showed her both tracings. ? ?RCRI perioperative cardiac risk calculator based on available information indicates class I, 0.4% major adverse cardiac event. ? ? ?Past Medical History:  ?Diagnosis Date  ? Anxiety   ? Arthritis   ? Breast cancer ([place])   ? Status post bilateral mastectomy  ? Breast reconstruction deformity   ? Complication of anesthesia   ? shaking-after mastectomies-resp and hr dropped  ? Depression, major   ? suicidal thoughts -implants, mental health, 05/11  ? Fibromyalgia   ? Hypothyroidism   ? Migraine headache   ? Narcolepsy   ? OSA on CPAP   ? Sleep apnea with use of continuous positive  airway pressure (CPAP)   ? AHI 20 in HST, titration to 6 cm water - residual AHi 6 . remaining hypersomnic.   ? ? ?Past Surgical History:  ?Procedure Laterality Date  ? ARTHRODESIS METATARSALPHALANGEAL JOINT (MTPJ) Right 07/28/2017  ? Procedure: ARTHRODESIS  RIGHT HALLUX METATARSALPHALANGEAL JOINT (MTPJ);  Surgeon: Wylene Simmer, MD;  Location: [place];  Service: Orthopedics;  Laterality: Right;  ? Breast reconstructioin  7/09  ? flaps-bilat mastectomies  ? BTL    ? CARPAL TUNNEL RELEASE  2011  ? both hands  ? CARPOMETACARPEL SUSPENSION PLASTY Right 08/29/2014  ? Procedure: REMOVAL DERMASPAN SUSPENSIONPLASTY RIGHT THUMB ABDUCTOR POLLIS TRANSFER;  Surgeon: Daryll Brod, MD;  Location: [place];  Service: Orthopedics;  Laterality: Right;  ? CATARACT EXTRACTION W/PHACO Right 12/17/2019  ? Procedure: CATARACT EXTRACTION PHACO AND INTRAOCULAR LENS PLACEMENT (IOC);  Surgeon: Baruch Goldmann, MD;  Location: [facility];  Service: Ophthalmology;  Laterality: Right;  CDE: 6.54  ? CATARACT EXTRACTION W/PHACO Left 12/31/2019  ? Procedure: CATARACT EXTRACTION PHACO AND INTRAOCULAR LENS PLACEMENT (IOC);  Surgeon: Baruch Goldmann, MD;  Location: [facility];  Service: Ophthalmology;  Laterality: Left;  CDE: 3.66  ? COLONOSCOPY W/ POLYPECTOMY  4/03  ? tubulovillous adenoma  ? FINGER ARTHROSCOPY WITH CARPOMETACARPEL [facility]) ARTHROPLASTY Right 06/20/2014  ? Procedure: ARTHROSCOPY RIGHT THUMB CARPOMETACARPEL GRAFT JACKET INTERPOSITION PARTIAL TRAPEZIECTOMY POSSIBLE OPEN;  Surgeon: Daryll Brod, MD;  Location: [place];  Service: Orthopedics;  Laterality:  Right;  ? GANGLION CYST EXCISION    ? both [phone]  ? HAMMER TOE SURGERY Right 07/28/2017  ? Procedure: THIRD HAMMERTOE CORRECTION;  Surgeon: Wylene Simmer, MD;  Location: [place];  Service: Orthopedics;  Laterality: Right;  ? HARDWARE REMOVAL Right 07/28/2017  ? Procedure: REMOVAL OF DEEP IMPLANTS;  Surgeon: Wylene Simmer, MD;  Location:  [place];  Service: Orthopedics;  Laterality: Right;  ? hysterestomy  2005  ? MASTECTOMY Bilateral 2008  ? post- a -cath  2008  ? right knee arthroscopy    ? TOTAL SHOULDER ARTHROPLASTY Left 01/13/2017  ? Procedure: LEFT TOTAL SHOULDER ARTHROPLASTY;  Surgeon: Tania Ade, MD;  Location: [place];  Service: Orthopedics;  Laterality: Left;  LEFT TOTAL SHOULDER ARTHROPLASTY  ? TRIGGER FINGER RELEASE Right 08/29/2014  ? Procedure: RELEASE TRIGGER FINGER/A-1 PULLEY;  Surgeon: Daryll Brod, MD;  Location: [place];  Service: Orthopedics;  Laterality: Right;  ? wisdom teeth out    ? ? ?Current Outpatient Medications  ?Medication Sig Dispense Refill  ? ARIPiprazole (ABILIFY) 20 MG tablet Take 20 mg by mouth daily.    ? benztropine (COGENTIN) 1 MG tablet Take 1 mg by mouth at bedtime.     ? busPIRone (BUSPAR) 10 MG tablet Take 10 mg by mouth 3 (three) times daily as needed.    ? cyclobenzaprine (FLEXERIL) 5 MG tablet Take 5 mg by mouth 3 (three) times daily as needed for muscle spasms.    ? diclofenac sodium (VOLTAREN) 1 % GEL Apply 1 application topically 4 (four) times daily as needed (pain.).     ? escitalopram (LEXAPRO) 20 MG tablet Take by mouth in the morning and at bedtime.    ? LINZESS 145 MCG CAPS capsule Take 145 mcg by mouth daily as needed for constipation.    ? Melatonin 5 MG TABS Take 5 mg by mouth at bedtime.    ? methocarbamol (ROBAXIN) 500 MG tablet Take 500 mg by mouth 4 (four) times daily as needed for muscle spasms.    ? methylphenidate (RITALIN LA) 30 MG 24 hr capsule Take 30 mg by mouth in the morning.    ? OZEMPIC, 1 MG/DOSE, 2 MG/1.5ML SOPN Inject 1 mg into the skin every Sunday.     ? pregabalin (LYRICA) 150 MG capsule Take 150 mg by mouth 2 (two) times daily.    ? propranolol ER (INDERAL LA) 120 MG 24 hr capsule Take 120 mg by mouth 2 (two) times daily.     ? Tapentadol HCl 150 MG TB12 Take 150 mg by mouth 2 (two) times daily.    ? traMADol (ULTRAM) 50 MG tablet Take  50 mg by mouth every 6 (six) hours as needed for moderate pain.    ? ?No current facility-administered medications for this visit.  ? ?Allergies:  Sulfonamide derivatives  ? ?Social History: The patient  reports that she quit smoking about 9 years ago. Her smoking use included cigarettes. She has a 3.75 pack-year smoking history. She has never used smokeless tobacco. She reports that she does not currently use alcohol. She reports that she does not use drugs.  ? ?Family History: The patient's family history includes Breast cancer in her maternal aunt, maternal grandmother, and mother; Colon cancer in her cousin and maternal grandmother; Healthy in her brother, daughter, daughter, and son; Leukemia in her granddaughter; Prostate cancer in her father.  ? ?ROS: No syncope, no orthopnea or PND, no leg swelling. ? ?Physical Exam: ?VS:  BP (!) 92/52   Pulse 86   Ht '5\' 4"'$  (1.626 m)   Wt 164 lb 6.4 oz (74.6 kg)   SpO2 95%   BMI 28.22 kg/m? , BMI Body mass index is 28.22 kg/m?. ? ?Wt Readings from Last 3 Encounters:  ?02/10/22 164 lb 6.4 oz (74.6 kg)  ?02/08/22 164 lb 1.6 oz (74.4 kg)  ?01/14/21 174 lb (78.9 kg)  ?  ?General: Patient appears comfortable at rest. ?HEENT: Conjunctiva and lids normal, oropharynx clear. ?Neck: Supple, no elevated JVP or carotid bruits, no thyromegaly. ?Lungs: Clear to auscultation, nonlabored breathing at rest. ?Cardiac: Distant regular heart sounds, no S3 or significant systolic murmur, no pericardial rub. ?Abdomen: Soft, nontender, bowel sounds present. ?Extremities: No pitting edema, distal pulses 2+.  Arthritic changes noted. ?Skin: Warm and dry. ?Musculoskeletal: No kyphosis. ?Neuropsychiatric: Alert and oriented x3, affect grossly appropriate. ? ?ECG:  An ECG dated 02/08/2022 was personally reviewed today and demonstrated:  Sinus rhythm with left bundle branch block, new since tracing 2018. ? ?Recent Labwork: ?02/08/2022: BUN 19; Creatinine, Ser 0.94; Hemoglobin 14.0; Platelets 282;  Potassium 3.8; Sodium 139  ? ?Other Studies Reviewed Today: ? ?Echocardiogram April 2009: ? SUMMARY  ? -  Overall left ventricular systolic function was at the lower  ?       limits of normal. Left ventricular ej

## 2022-02-10 NOTE — Telephone Encounter (Signed)
[name] is wanting to know if there is a way to expedite echo results... pt is scheduled for surgery tomorrow morning at 7am and needs these results to proceed... please advise ?

## 2022-02-10 NOTE — Telephone Encounter (Signed)
-----   Message from Satira Sark, MD sent at 02/10/2022  4:17 PM EDT ----- ?Results reviewed.  Echocardiogram shows low normal LVEF at 50 to 55% with septal motion consistent with left bundle branch block.  No significant valvular abnormalities.  She should be able to proceed with planned surgery as per consultation note. ?

## 2022-02-11 ENCOUNTER — Ambulatory Visit (HOSPITAL_BASED_OUTPATIENT_CLINIC_OR_DEPARTMENT_OTHER): Payer: No Typology Code available for payment source | Admitting: Anesthesiology

## 2022-02-11 ENCOUNTER — Observation Stay (HOSPITAL_COMMUNITY): Payer: No Typology Code available for payment source

## 2022-02-11 ENCOUNTER — Ambulatory Visit (HOSPITAL_COMMUNITY): Payer: No Typology Code available for payment source | Admitting: Vascular Surgery

## 2022-02-11 ENCOUNTER — Other Ambulatory Visit: Payer: Self-pay

## 2022-02-11 ENCOUNTER — Observation Stay (HOSPITAL_COMMUNITY)
Admission: RE | Admit: 2022-02-11 | Discharge: 2022-02-12 | Disposition: A | Discharge status: Home / Self Care | Payer: No Typology Code available for payment source | Attending: Orthopedic Surgery | Admitting: Orthopedic Surgery

## 2022-02-11 ENCOUNTER — Encounter (HOSPITAL_COMMUNITY)
Admission: RE | Disposition: A | Discharge status: Home / Self Care | Payer: Self-pay | Source: Home / Self Care | Attending: Orthopedic Surgery

## 2022-02-11 DIAGNOSIS — E039 Hypothyroidism, unspecified: Secondary | ICD-10-CM | POA: Insufficient documentation

## 2022-02-11 DIAGNOSIS — M19011 Primary osteoarthritis, right shoulder: Secondary | ICD-10-CM

## 2022-02-11 DIAGNOSIS — Z9989 Dependence on other enabling machines and devices: Secondary | ICD-10-CM | POA: Diagnosis not present

## 2022-02-11 DIAGNOSIS — Z79899 Other long term (current) drug therapy: Secondary | ICD-10-CM | POA: Diagnosis not present

## 2022-02-11 DIAGNOSIS — G4733 Obstructive sleep apnea (adult) (pediatric): Secondary | ICD-10-CM

## 2022-02-11 DIAGNOSIS — F418 Other specified anxiety disorders: Secondary | ICD-10-CM | POA: Diagnosis not present

## 2022-02-11 DIAGNOSIS — Z87891 Personal history of nicotine dependence: Secondary | ICD-10-CM | POA: Diagnosis not present

## 2022-02-11 DIAGNOSIS — Z853 Personal history of malignant neoplasm of breast: Secondary | ICD-10-CM | POA: Diagnosis not present

## 2022-02-11 DIAGNOSIS — Z96611 Presence of right artificial shoulder joint: Secondary | ICD-10-CM | POA: Diagnosis not present

## 2022-02-11 HISTORY — PX: TOTAL SHOULDER ARTHROPLASTY: SHX126

## 2022-02-11 SURGERY — ARTHROPLASTY, SHOULDER, TOTAL
Anesthesia: General | Site: Shoulder | Laterality: Right

## 2022-02-11 MED ORDER — MORPHINE SULFATE (PF) 2 MG/ML IV SOLN
0.5000 mg | INTRAVENOUS | Status: DC | PRN
  Administered 2022-02-11 – 2022-02-12 (×2): 0.5 mg via INTRAVENOUS
  Filled 2022-02-11 (×2): qty 1

## 2022-02-11 MED ORDER — ACETAMINOPHEN 500 MG PO TABS
500.0000 mg | ORAL_TABLET | Freq: Four times a day (QID) | ORAL | Status: AC
  Administered 2022-02-11 – 2022-02-12 (×4): 500 mg via ORAL
  Filled 2022-02-11 (×4): qty 1

## 2022-02-11 MED ORDER — ACETAMINOPHEN 325 MG PO TABS
325.0000 mg | ORAL_TABLET | Freq: Four times a day (QID) | ORAL | Status: DC | PRN
  Administered 2022-02-12: 500 mg via ORAL

## 2022-02-11 MED ORDER — DEXMEDETOMIDINE (PRECEDEX) IN NS 20 MCG/5ML (4 MCG/ML) IV SYRINGE
PREFILLED_SYRINGE | INTRAVENOUS | Status: DC | PRN
  Administered 2022-02-11: 8 ug via INTRAVENOUS

## 2022-02-11 MED ORDER — MIDAZOLAM HCL 2 MG/2ML IJ SOLN
INTRAMUSCULAR | Status: AC
  Administered 2022-02-11: 2 mg via INTRAVENOUS
  Filled 2022-02-11: qty 2

## 2022-02-11 MED ORDER — HYDROMORPHONE HCL 1 MG/ML IJ SOLN: 0.2500 mg | INTRAMUSCULAR | Status: DC | PRN

## 2022-02-11 MED ORDER — METHYLPHENIDATE HCL ER (LA) 30 MG PO CP24: 30.0000 mg | ORAL_CAPSULE | Freq: Every day | ORAL | Status: DC

## 2022-02-11 MED ORDER — BUPIVACAINE LIPOSOME 1.3 % IJ SUSP
INTRAMUSCULAR | Status: DC | PRN
  Administered 2022-02-11 (×5): 2 mL via PERINEURAL

## 2022-02-11 MED ORDER — VANCOMYCIN HCL 1000 MG IV SOLR
INTRAVENOUS | Status: AC
  Filled 2022-02-11: qty 20

## 2022-02-11 MED ORDER — ESCITALOPRAM OXALATE 10 MG PO TABS
20.0000 mg | ORAL_TABLET | Freq: Two times a day (BID) | ORAL | Status: DC
  Administered 2022-02-11 – 2022-02-12 (×2): 20 mg via ORAL
  Filled 2022-02-11 (×2): qty 2

## 2022-02-11 MED ORDER — PHENOL 1.4 % MT LIQD: 1.0000 | OROMUCOSAL | Status: DC | PRN

## 2022-02-11 MED ORDER — BUPIVACAINE-EPINEPHRINE (PF) 0.5% -1:200000 IJ SOLN
INTRAMUSCULAR | Status: DC | PRN
  Administered 2022-02-11 (×5): 3 mL via PERINEURAL

## 2022-02-11 MED ORDER — SCOPOLAMINE 1 MG/3DAYS TD PT72
1.0000 | MEDICATED_PATCH | TRANSDERMAL | Status: DC
Start: 2022-02-11 — End: 2022-02-12
  Administered 2022-02-11: 1.5 mg via TRANSDERMAL
  Filled 2022-02-11: qty 1

## 2022-02-11 MED ORDER — FENTANYL CITRATE (PF) 100 MCG/2ML IJ SOLN
INTRAMUSCULAR | Status: DC | PRN
  Administered 2022-02-11: 50 ug via INTRAVENOUS

## 2022-02-11 MED ORDER — METHYLPHENIDATE HCL ER (LA) 10 MG PO CP24: 30.0000 mg | ORAL_CAPSULE | Freq: Every morning | ORAL | Status: DC

## 2022-02-11 MED ORDER — INSULIN ASPART 100 UNIT/ML IJ SOLN: 0.0000 [IU] | Freq: Three times a day (TID) | INTRAMUSCULAR | Status: DC

## 2022-02-11 MED ORDER — VANCOMYCIN HCL 1000 MG IV SOLR
INTRAVENOUS | Status: DC | PRN
  Administered 2022-02-11: 1000 mg via TOPICAL

## 2022-02-11 MED ORDER — HYDROCODONE-ACETAMINOPHEN 7.5-325 MG PO TABS: 1.0000 | ORAL_TABLET | ORAL | Status: DC | PRN

## 2022-02-11 MED ORDER — LACTATED RINGERS IV SOLN: INTRAVENOUS | Status: DC

## 2022-02-11 MED ORDER — PREGABALIN 75 MG PO CAPS
150.0000 mg | ORAL_CAPSULE | Freq: Two times a day (BID) | ORAL | Status: DC
  Administered 2022-02-11 – 2022-02-12 (×2): 150 mg via ORAL
  Filled 2022-02-11 (×2): qty 2

## 2022-02-11 MED ORDER — KETOROLAC TROMETHAMINE 30 MG/ML IJ SOLN: 30.0000 mg | Freq: Once | INTRAMUSCULAR | Status: DC | PRN

## 2022-02-11 MED ORDER — MENTHOL 3 MG MT LOZG: 1.0000 | LOZENGE | OROMUCOSAL | Status: DC | PRN

## 2022-02-11 MED ORDER — CEFAZOLIN SODIUM-DEXTROSE 1-4 GM/50ML-% IV SOLN
1.0000 g | Freq: Four times a day (QID) | INTRAVENOUS | Status: AC
  Administered 2022-02-11 – 2022-02-12 (×3): 1 g via INTRAVENOUS
  Filled 2022-02-11 (×3): qty 50

## 2022-02-11 MED ORDER — LIDOCAINE 2% (20 MG/ML) 5 ML SYRINGE
INTRAMUSCULAR | Status: DC | PRN
  Administered 2022-02-11: 80 mg via INTRAVENOUS

## 2022-02-11 MED ORDER — PROPRANOLOL HCL ER 60 MG PO CP24
120.0000 mg | ORAL_CAPSULE | Freq: Two times a day (BID) | ORAL | Status: DC
  Administered 2022-02-11 – 2022-02-12 (×2): 120 mg via ORAL
  Filled 2022-02-11: qty 2
  Filled 2022-02-11: qty 1
  Filled 2022-02-11 (×2): qty 2

## 2022-02-11 MED ORDER — ARIPIPRAZOLE 10 MG PO TABS
20.0000 mg | ORAL_TABLET | Freq: Every day | ORAL | Status: DC
  Administered 2022-02-12: 20 mg via ORAL
  Filled 2022-02-11: qty 2

## 2022-02-11 MED ORDER — PROPOFOL 10 MG/ML IV BOLUS
INTRAVENOUS | Status: AC
  Filled 2022-02-11: qty 20

## 2022-02-11 MED ORDER — PROPOFOL 10 MG/ML IV BOLUS
INTRAVENOUS | Status: DC | PRN
Start: 2022-02-11 — End: 2022-02-11
  Administered 2022-02-11: 200 mg via INTRAVENOUS

## 2022-02-11 MED ORDER — TAPENTADOL HCL ER 50 MG PO TB12
150.0000 mg | ORAL_TABLET | Freq: Two times a day (BID) | ORAL | Status: DC
Start: 2022-02-11 — End: 2022-02-12
  Filled 2022-02-11 (×2): qty 3

## 2022-02-11 MED ORDER — MELATONIN 5 MG PO TABS
5.0000 mg | ORAL_TABLET | Freq: Every day | ORAL | Status: DC
  Administered 2022-02-11: 5 mg via ORAL
  Filled 2022-02-11: qty 1

## 2022-02-11 MED ORDER — CYCLOBENZAPRINE HCL 5 MG PO TABS: 5.0000 mg | ORAL_TABLET | Freq: Three times a day (TID) | ORAL | Status: DC | PRN

## 2022-02-11 MED ORDER — FENTANYL CITRATE (PF) 250 MCG/5ML IJ SOLN
INTRAMUSCULAR | Status: AC
  Filled 2022-02-11: qty 5

## 2022-02-11 MED ORDER — ONDANSETRON HCL 4 MG/2ML IJ SOLN: 4.0000 mg | Freq: Once | INTRAMUSCULAR | Status: DC | PRN

## 2022-02-11 MED ORDER — BENZTROPINE MESYLATE 1 MG PO TABS
1.0000 mg | ORAL_TABLET | Freq: Every day | ORAL | Status: DC
  Administered 2022-02-11: 1 mg via ORAL
  Filled 2022-02-11 (×2): qty 1

## 2022-02-11 MED ORDER — ROCURONIUM BROMIDE 10 MG/ML (PF) SYRINGE
PREFILLED_SYRINGE | INTRAVENOUS | Status: DC | PRN
  Administered 2022-02-11: 60 mg via INTRAVENOUS

## 2022-02-11 MED ORDER — DOCUSATE SODIUM 100 MG PO CAPS
100.0000 mg | ORAL_CAPSULE | Freq: Two times a day (BID) | ORAL | Status: DC
  Administered 2022-02-11 – 2022-02-12 (×2): 100 mg via ORAL
  Filled 2022-02-11 (×2): qty 1

## 2022-02-11 MED ORDER — HYDROCODONE-ACETAMINOPHEN 5-325 MG PO TABS: 1.0000 | ORAL_TABLET | ORAL | Status: DC | PRN

## 2022-02-11 MED ORDER — ONDANSETRON HCL 4 MG/2ML IJ SOLN: 4.0000 mg | Freq: Four times a day (QID) | INTRAMUSCULAR | Status: DC | PRN

## 2022-02-11 MED ORDER — METOCLOPRAMIDE HCL 5 MG PO TABS: 5.0000 mg | ORAL_TABLET | Freq: Three times a day (TID) | ORAL | Status: DC | PRN

## 2022-02-11 MED ORDER — ONDANSETRON HCL 4 MG/2ML IJ SOLN
INTRAMUSCULAR | Status: DC | PRN
  Administered 2022-02-11: 4 mg via INTRAVENOUS

## 2022-02-11 MED ORDER — MEPERIDINE HCL 25 MG/ML IJ SOLN: 6.2500 mg | INTRAMUSCULAR | Status: DC | PRN

## 2022-02-11 MED ORDER — TRANEXAMIC ACID-NACL 1000-0.7 MG/100ML-% IV SOLN
1000.0000 mg | Freq: Once | INTRAVENOUS | Status: AC
  Administered 2022-02-11: 1000 mg via INTRAVENOUS
  Filled 2022-02-11: qty 100

## 2022-02-11 MED ORDER — MIDAZOLAM HCL 2 MG/2ML IJ SOLN
2.0000 mg | Freq: Once | INTRAMUSCULAR | Status: AC
  Filled 2022-02-11: qty 2

## 2022-02-11 MED ORDER — PHENYLEPHRINE 40 MCG/ML (10ML) SYRINGE FOR IV PUSH (FOR BLOOD PRESSURE SUPPORT)
PREFILLED_SYRINGE | INTRAVENOUS | Status: DC | PRN
  Administered 2022-02-11: 80 ug via INTRAVENOUS
  Administered 2022-02-11: 120 ug via INTRAVENOUS
  Administered 2022-02-11: 40 ug via INTRAVENOUS
  Administered 2022-02-11: 120 ug via INTRAVENOUS
  Administered 2022-02-11: 40 ug via INTRAVENOUS

## 2022-02-11 MED ORDER — ONDANSETRON HCL 4 MG PO TABS: 4.0000 mg | ORAL_TABLET | Freq: Four times a day (QID) | ORAL | Status: DC | PRN

## 2022-02-11 MED ORDER — FENTANYL CITRATE (PF) 100 MCG/2ML IJ SOLN
100.0000 ug | Freq: Once | INTRAMUSCULAR | Status: AC
  Filled 2022-02-11: qty 2

## 2022-02-11 MED ORDER — INSULIN ASPART 100 UNIT/ML IJ SOLN: 0.0000 [IU] | Freq: Every day | INTRAMUSCULAR | Status: DC

## 2022-02-11 MED ORDER — SUGAMMADEX SODIUM 200 MG/2ML IV SOLN
INTRAVENOUS | Status: DC | PRN
  Administered 2022-02-11 (×2): 200 mg via INTRAVENOUS

## 2022-02-11 MED ORDER — DEXAMETHASONE SODIUM PHOSPHATE 10 MG/ML IJ SOLN
INTRAMUSCULAR | Status: DC | PRN
  Administered 2022-02-11: 5 mg via INTRAVENOUS

## 2022-02-11 MED ORDER — PHENYLEPHRINE HCL-NACL 20-0.9 MG/250ML-% IV SOLN
INTRAVENOUS | Status: DC | PRN
  Administered 2022-02-11: 50 ug/min via INTRAVENOUS

## 2022-02-11 MED ORDER — ORAL CARE MOUTH RINSE: 15.0000 mL | Freq: Once | OROMUCOSAL | Status: AC

## 2022-02-11 MED ORDER — METOCLOPRAMIDE HCL 5 MG/ML IJ SOLN: 5.0000 mg | Freq: Three times a day (TID) | INTRAMUSCULAR | Status: DC | PRN

## 2022-02-11 MED ORDER — EPHEDRINE SULFATE-NACL 50-0.9 MG/10ML-% IV SOSY
PREFILLED_SYRINGE | INTRAVENOUS | Status: DC | PRN
  Administered 2022-02-11: 10 mg via INTRAVENOUS
  Administered 2022-02-11: 5 mg via INTRAVENOUS

## 2022-02-11 MED ORDER — 0.9 % SODIUM CHLORIDE (POUR BTL) OPTIME
TOPICAL | Status: DC | PRN
  Administered 2022-02-11 (×2): 1000 mL

## 2022-02-11 MED ORDER — TRANEXAMIC ACID-NACL 1000-0.7 MG/100ML-% IV SOLN
1000.0000 mg | INTRAVENOUS | Status: AC
  Administered 2022-02-11: 1000 mg via INTRAVENOUS
  Filled 2022-02-11: qty 100

## 2022-02-11 MED ORDER — CHLORHEXIDINE GLUCONATE 0.12 % MT SOLN
15.0000 mL | Freq: Once | OROMUCOSAL | Status: AC
  Administered 2022-02-11: 15 mL via OROMUCOSAL

## 2022-02-11 MED ORDER — CEFAZOLIN SODIUM-DEXTROSE 2-4 GM/100ML-% IV SOLN
2.0000 g | INTRAVENOUS | Status: AC
  Administered 2022-02-11: 2 g via INTRAVENOUS
  Filled 2022-02-11: qty 100

## 2022-02-11 MED ORDER — FENTANYL CITRATE (PF) 100 MCG/2ML IJ SOLN
INTRAMUSCULAR | Status: AC
  Administered 2022-02-11: 100 ug via INTRAVENOUS
  Filled 2022-02-11: qty 2

## 2022-02-11 SURGICAL SUPPLY — 65 items
AID PSTN UNV HD RSTRNT DISP (MISCELLANEOUS) ×1
ALCOHOL 70% 16 OZ (MISCELLANEOUS) ×3 IMPLANT
BAG COUNTER SPONGE SURGICOUNT (BAG) ×3 IMPLANT
BAG SPNG CNTER NS LX DISP (BAG) ×1
BIT DRILL 5/64X5 DISP (BIT) IMPLANT
BLADE SAG 18X100X1.27 (BLADE) ×3 IMPLANT
BODY TRUNION ECLIPSE 39 SL (Shoulder) ×1 IMPLANT
CEMENT BONE R 1X40 (Cement) ×3 IMPLANT
CLSR STERI-STRIP ANTIMIC 1/2X4 (GAUZE/BANDAGES/DRESSINGS) ×1 IMPLANT
COVER SURGICAL LIGHT HANDLE (MISCELLANEOUS) ×3 IMPLANT
DRAPE ORTHO SPLIT 77X108 STRL (DRAPES) ×4
DRAPE SURG ORHT 6 SPLT 77X108 (DRAPES) ×4 IMPLANT
DRAPE U-SHAPE 47X51 STRL (DRAPES) ×3 IMPLANT
DRSG ADAPTIC 3X8 NADH LF (GAUZE/BANDAGES/DRESSINGS) ×3 IMPLANT
DRSG AQUACEL AG ADV 3.5X10 (GAUZE/BANDAGES/DRESSINGS) ×1 IMPLANT
DRSG PAD ABDOMINAL 8X10 ST (GAUZE/BANDAGES/DRESSINGS) ×3 IMPLANT
DURAPREP 26ML APPLICATOR (WOUND CARE) ×3 IMPLANT
ELECT BLADE 4.0 EZ CLEAN MEGAD (MISCELLANEOUS) ×2
ELECT REM PT RETURN 9FT ADLT (ELECTROSURGICAL) ×2
ELECTRODE BLDE 4.0 EZ CLN MEGD (MISCELLANEOUS) ×2 IMPLANT
ELECTRODE REM PT RTRN 9FT ADLT (ELECTROSURGICAL) ×2 IMPLANT
FACESHIELD WRAPAROUND (MASK) ×2 IMPLANT
FACESHIELD WRAPAROUND OR TEAM (MASK) ×2 IMPLANT
GAUZE SPONGE 4X4 12PLY STRL (GAUZE/BANDAGES/DRESSINGS) ×3 IMPLANT
GLENOID WITH CLEAT MEDIUM (Shoulder) ×2 IMPLANT
GLOVE SRG 8 PF TXTR STRL LF DI (GLOVE) ×4 IMPLANT
GLOVE SURG ENC MOIS LTX SZ7.5 (GLOVE) ×6 IMPLANT
GLOVE SURG UNDER POLY LF SZ8 (GLOVE) ×4
GOWN STRL REUS W/ TWL LRG LVL3 (GOWN DISPOSABLE) ×2 IMPLANT
GOWN STRL REUS W/ TWL XL LVL3 (GOWN DISPOSABLE) ×4 IMPLANT
GOWN STRL REUS W/TWL LRG LVL3 (GOWN DISPOSABLE) ×2
GOWN STRL REUS W/TWL XL LVL3 (GOWN DISPOSABLE) ×4
HEAD HUMERAL ECLIPSE 39/16 (Shoulder) ×1 IMPLANT
IMPL ECLIPSE SPEEDCAP (Shoulder) IMPLANT
IMPLANT ECLIPSE SPEEDCAP (Shoulder) ×2 IMPLANT
KIT BASIN OR (CUSTOM PROCEDURE TRAY) ×3 IMPLANT
KIT SET UNIVERSAL (KITS) ×1 IMPLANT
KIT TURNOVER KIT B (KITS) ×3 IMPLANT
MANIFOLD NEPTUNE II (INSTRUMENTS) ×3 IMPLANT
NDL TAPERED W/ NITINOL LOOP (MISCELLANEOUS) ×2 IMPLANT
NEEDLE TAPERED W/ NITINOL LOOP (MISCELLANEOUS) ×2 IMPLANT
NS IRRIG 1000ML POUR BTL (IV SOLUTION) ×3 IMPLANT
PACK SHOULDER (CUSTOM PROCEDURE TRAY) ×3 IMPLANT
PAD ARMBOARD 7.5X6 YLW CONV (MISCELLANEOUS) ×6 IMPLANT
RESTRAINT HEAD UNIVERSAL NS (MISCELLANEOUS) ×3 IMPLANT
SCREW SM FOR ECLIPSE CAGE 30 (Screw) ×1 IMPLANT
SIZER ECLIPSE CAGE SCREW (ORTHOPEDIC DISPOSABLE SUPPLIES) ×1 IMPLANT
SLING ARM FOAM STRAP LRG (SOFTGOODS) IMPLANT
SLING ARM FOAM STRAP MED (SOFTGOODS) IMPLANT
SLING ARM IMMOBILIZER LRG (SOFTGOODS) ×1 IMPLANT
SMARTMIX MINI TOWER (MISCELLANEOUS)
SPONGE T-LAP 18X18 ~~LOC~~+RFID (SPONGE) ×3 IMPLANT
SPONGE T-LAP 4X18 ~~LOC~~+RFID (SPONGE) IMPLANT
STRIP CLOSURE SKIN 1/2X4 (GAUZE/BANDAGES/DRESSINGS) ×3 IMPLANT
SUCTION FRAZIER HANDLE 10FR (MISCELLANEOUS) ×2
SUCTION TUBE FRAZIER 10FR DISP (MISCELLANEOUS) ×2 IMPLANT
SUT MONOCRYL 3-0 PS-2 UND MONO (SUTURE) ×3 IMPLANT
SUT VIC AB 2-0 CT1 27 (SUTURE) ×2
SUT VIC AB 2-0 CT1 TAPERPNT 27 (SUTURE) ×2 IMPLANT
SUTURE TAPE 1.3 40 TPR END (SUTURE) ×2 IMPLANT
SUTURETAPE 1.3 40 TPR END (SUTURE) ×2
TOWEL GREEN STERILE (TOWEL DISPOSABLE) ×3 IMPLANT
TOWER SMARTMIX MINI (MISCELLANEOUS) IMPLANT
WATER STERILE IRR 1000ML POUR (IV SOLUTION) ×3 IMPLANT
YANKAUER SUCT BULB TIP NO VENT (SUCTIONS) ×3 IMPLANT

## 2022-02-11 NOTE — Discharge Instructions (Addendum)
Orthopedic surgery discharge instructions: ? ?-Maintain postoperative bandage until follow-up appointment.  This is waterproof, and you may begin showering on postoperative day #2.  Do not submerge underwater.  Maintain that bandage until your follow-up appointment in 2 weeks. ? ?-No lifting over 2 pounds with operative arm.  Any lifting should also be accomplished with the arm in the sling.  You should not move the upper arm away from the body at all this is to protect the rotator cuff.  She should also sleep in the sling.You can work on elbow, wrist, and finger motion outside of the sling.  You can take the sling off to work on elbow, wrist, and finger motion, for showering, and for therapy.  Otherwise the sling should remain on. ? ?-Apply ice liberally to the shoulder throughout the day.  For mild to moderate pain use Tylenol and Advil as needed around-the-clock.  For breakthrough pain use oxycodone as necessary. ? ?-You will return to see Dr. Stann Mainland in the office in 2 weeks for routine postoperative check with x-rays.  Call [phone] if you do not have an appointment already. ? ?

## 2022-02-11 NOTE — Plan of Care (Signed)

## 2022-02-11 NOTE — Brief Op Note (Signed)
02/11/2022 ? ?12:22 PM ? ?PATIENT:  [name]  61 y.o. female ? ?PRE-OPERATIVE DIAGNOSIS:  Right shoulder osteoarthritis ? ?POST-OPERATIVE DIAGNOSIS:  Right shoulder osteoarthritis ? ?PROCEDURE:  Procedure(s) with comments: ?TOTAL SHOULDER ARTHROPLASTY (Right) - 150 ? ?SURGEON:  Surgeon(s) and Role: ?   Stann Mainland, Elly Modena, MD - Primary ? ?PHYSICIAN ASSISTANT: Jonelle Sidle, PA-C ? ? ?ANESTHESIA:   regional and general ? ?EBL:  100 mL  ? ?BLOOD ADMINISTERED:none ? ?DRAINS: none  ? ?LOCAL MEDICATIONS USED:  NONE ? ?SPECIMEN:  No Specimen ? ?DISPOSITION OF SPECIMEN:  N/A ? ?COUNTS:  YES ? ?TOURNIQUET:  * No tourniquets in log * ? ?DICTATION: .Note written in EPIC ? ?PLAN OF CARE: Admit for overnight observation ? ?PATIENT DISPOSITION:  PACU - hemodynamically stable. ?  ?Delay start of Pharmacological VTE agent (>24hrs) due to surgical blood loss or risk of bleeding: n/a ? ?

## 2022-02-11 NOTE — Anesthesia Procedure Notes (Signed)
Procedure Name: Intubation ?Date/Time: 02/11/2022 10:36 AM ?Performed by: Georgia Duff, CRNA ?Pre-anesthesia Checklist: Patient identified, Emergency Drugs available, Suction available and Patient being monitored ?Patient Re-evaluated:Patient Re-evaluated prior to induction ?Oxygen Delivery Method: Circle System Utilized ?Preoxygenation: Pre-oxygenation with 100% oxygen ?Induction Type: IV induction ?Ventilation: Mask ventilation without difficulty ?Laryngoscope Size: Mac and 3 ?Grade View: Grade I ?Tube type: Oral ?Tube size: 7.0 mm ?Number of attempts: 1 ?Airway Equipment and Method: Stylet and Oral airway ?Placement Confirmation: ETT inserted through vocal cords under direct vision, positive ETCO2 and breath sounds checked- equal and bilateral ?Secured at: 21 cm ?Tube secured with: Tape ?Dental Injury: Teeth and Oropharynx as per pre-operative assessment  ? ? ? ? ?

## 2022-02-11 NOTE — Anesthesia Postprocedure Evaluation (Signed)
Anesthesia Post Note ? ?Patient: [name] ? ?Procedure(s) Performed: TOTAL SHOULDER ARTHROPLASTY (Right: Shoulder) ? ?  ? ?Patient location during evaluation: PACU ?Anesthesia Type: General ?Level of consciousness: awake ?Pain management: pain level controlled ?Vital Signs Assessment: post-procedure vital signs reviewed and stable ?Respiratory status: spontaneous breathing ?Cardiovascular status: stable ?Postop Assessment: no apparent nausea or vomiting ?Anesthetic complications: no ? ? ?No notable events documented. ? ?Last Vitals:  ?Vitals:  ? 02/11/22 1415 02/11/22 1430  ?BP: 139/80 113/73  ?Pulse: 65 68  ?Resp: 14 17  ?Temp:    ?SpO2: 93% 93%  ?  ?Last Pain:  ?Vitals:  ? 02/11/22 1415  ?TempSrc:   ?PainSc: 0-No pain  ? ? ?  ?  ?  ?  ?  ?  ? ?Huston Foley ? ? ? ? ?

## 2022-02-11 NOTE — Op Note (Signed)
Date of Surgery: 02/11/2022 ? ?INDICATIONS: [name] is a 61 y.o.-year-old female with a right shoulder end-stage osteoarthritis;  The patient did consent to the procedure after discussion of the risks and benefits. ? ?PREOPERATIVE DIAGNOSIS: Right shoulder osteoarthritis ? ?POSTOPERATIVE DIAGNOSIS: Same. ? ?PROCEDURE: Anatomic total shoulder right side ? ?SURGEON: Geralynn Rile, M.D. ? ?ASSIST: Jonelle Sidle, PA-C ? ?Assistant attestation: ? ?PA Thereasa Solo was present for the entire procedure. ? ?ANESTHESIA:  general, with regional interscalene ? ?IV FLUIDS AND URINE: See anesthesia. ? ?ESTIMATED BLOOD LOSS: 100 mL. ? ?IMPLANTS: Arthrex Eclipse size medium vault lock glenoid ?Size 39 trunnion with a small cage screw with bone graft auto. ?Humeral head size 39 x 16 ? ?Arthrex speed Subscap repair in all knotless fashion with 3 medial anchors using the 2.6 mm knotless fiber tacks and 2 lateral anchors with 3.9 mm Arthrex swivel lock anchors. ? ?DRAINS: none ? ?COMPLICATIONS: None. ? ?DESCRIPTION OF PROCEDURE: After obtaining informed consent,The patient was brought to the operating room and placed supine on the operating table.  The patient had been signed prior to the procedure and this was documented. The patient had the anesthesia placed by the anesthesiologist.  A time-out was performed to confirm that this was the correct patient, site, side and location. The patient did receive antibiotics prior to the incision and was re-dosed during the procedure as needed at indicated intervals.  Patient was then set up into a semibeachchair position.  The patient had the operative extremity prepped and draped in the standard surgical fashion.   The head and neck were nicely stabilized. ? ? ?A 10 blade was used to make a standard deltopectoral approach to the shoulder.  Dissection was carried out through the subcutaneous tissue to where the deltopectoral interval was visualized and developed.  The cephalic vein was mobilized  and retracted medially for the duration of the case.  The subacromial space was cleared bluntly retractors were placed appropriately deep to the pectoralis major tendon and deep to the deltoid muscle fascia.  The upper one third of the pectoralis muscle insertion on the humerus was sharply released.  There was found to be a small rent in the supraspinatus but otherwise the rotator cuff tendons were intact. ? ?Next I performed a subscapularis takedown and a peel fashion.  Subscapularis, rotator interval, and the inferior capsule were all released.  There were inferior humeral osteophytes that were removed with rongeur and curved osteotome.  After, Releases, a humeral head osteotomy was performed in 30? of retroversion and at 135? head neck angle.  A protractor was placed within the glenoid vault to protect axillary nerve and posterior capsular structures. ? ?Next we prepared the humerus.  At this time we recognize that the size 39 trunnion was appropriate as well as a small cage screw.  We placed temporary head cut protector for working on the glenoid. ? ?Retractors were placed anteriorly, superiorly, and posteriorly and the labrum was excised.  Exposure of the glenoid was obtained.  The center drill bit was used followed by sequential reaming.  Next using the pegged glenoid guide the holes were drilled appropriately.  The medium size glenoid proved to be the appropriate size for this glenoid.  Next the holes were drilled and chiseled appropriately.  Glenoid  was washed with antibiotic irrigation.  Next glenoid vault was dried.  We trialed once again and were satisfied with the medium sized the glenoid.  Next, using antibodies cementing the superior and inferior holes the glenoid  was cemented into place.  Of note, the center ball-like peg was filled with humeral head autograft and not cemented per the manufacturer's recommendation.  Excellent fixation of the glenoid was obtained. ? ? ?We then turned our attention  back to the humeral side.  We implanted the final size 39 trunnion with the size small cage screw which we had placed autograft from the humeral head into that screw.  This provided excellent coverage of our cut surface on the humeral side.  We then trialed humeral heads and found the 39 x 16 mm humeral head to be satisfactory.  This demonstrated posterior pushback of just at 50% with spontaneous reduction and likewise inferior pull down of 50% with spontaneous reduction.  That head was then removed and the final humeral head was malleted in place with the above size. ? ? ?Lastly we turned our attention to the subscapularis repair.  The shoulder was copiously irrigated once again with antibiotic solution.  We then performed our knotless subscapularis repair.  We placed 3 Medial Row anchors in the lesser tuberosity with 2.6 knotless fiber tack anchors preloaded with suture tapes.  We then passed these at the myotendinous junction.  We divided these 6 suture limbs as well as the 2 previous suture limbs that were placed for retraction stitch into the subscapularis tendon and divided these equally into 4 and 4 suture tails.  These were then placed into 3.9 mm knotless swivel lock anchors which were placed into the biceps groove for lateral row fixation.  We had excellent fixation..  A #2 FiberWire was then used to close the rotator interval.  This was done in 45? of abduction and external rotation.  Patient's biceps tendon was abraded and fixed to the pectoralis major insertion with a #2 FiberWire in a figure-of-eight fashion.  The wound was once again copious bleed irrigated using antibiotic solution and then normal saline.  We evaluated once again for hemostasis.  The wound was closed in layers with a #2 FiberWire in the deltopectoral interval.  2-0 Vicryl for the deep dermal layer and 3-0 Monocryl in a subcuticular fashion followed by Dermabond glue.  A standard sterile occlusive dressing was applied as well as a  postoperative sling. ? ? ?The patient tolerated the procedure well.  All counts were correct ?2.  There were no intraoperative complications.  The patient was transferred to PACU in stable condition. ? ? ?Disposition: ?The patient will be nonweightbearing to the operative extremity for proximally 4-6 weeks.  He will begin physical therapy in the outpatient setting.  She will be admitted for routine postoperative care including antibiotics, pain control, and DVT prophylaxis.  Sling will be maintained at all times.  Return for wound check in approximately 2 weeks and then again at 6 weeks. ? ? ?

## 2022-02-11 NOTE — Anesthesia Procedure Notes (Signed)
Anesthesia Regional Block: Interscalene brachial plexus block  ? ?Pre-Anesthetic Checklist: , timeout performed,  Correct Patient, Correct Site, Correct Laterality,  Correct Procedure, Correct Position, site marked,  Risks and benefits discussed,  Surgical consent,  Pre-op evaluation,  At surgeon's request and post-op pain management ? ?Laterality: Right and Upper ? ?Prep: chloraprep     ?  ?Needles:  ?Injection technique: Single-shot ? ?Needle Type: Echogenic Stimulator Needle   ? ? ?Needle Length: 9cm  ?Needle Gauge: 20  ? ?Needle insertion depth: 1 cm ? ? ?Additional Needles: ? ? ?Procedures:,,,, ultrasound used (permanent image in chart),,    ?Narrative:  ?Start time: 02/11/2022 10:05 AM ?End time: 02/11/2022 10:15 AM ?Injection made incrementally with aspirations every 5 mL. ? ?Performed by: Personally  ?Anesthesiologist: Lyn Hollingshead, MD ? ? ? ? ?

## 2022-02-11 NOTE — H&P (Signed)
? ?ORTHOPAEDIC H&P ? ?REQUESTING PHYSICIAN: Nicholes Stairs, MD ? ?PCP:  Ginger Organ., MD ? ?Chief Complaint: Right shoulder pain ? ?HPI: ?[name] is a 61 y.o. female who complains of right shoulder osteoarthritis.  Here today for total shoulder arthroplasty.  No new complaints at this time.  She has been cleared for surgery by cardiology evaluation as well as primary care provider.  No unanticipated risk at this time. ? ?Past Medical History:  ?Diagnosis Date  ? Anxiety   ? Arthritis   ? Breast cancer ([place])   ? Status post bilateral mastectomy  ? Breast reconstruction deformity   ? Complication of anesthesia   ? shaking-after mastectomies-resp and hr dropped  ? Depression, major   ? suicidal thoughts -implants, mental health, 05/11  ? Fibromyalgia   ? Hypothyroidism   ? Migraine headache   ? Narcolepsy   ? OSA on CPAP   ? Sleep apnea with use of continuous positive airway pressure (CPAP)   ? AHI 20 in HST, titration to 6 cm water - residual AHi 6 . remaining hypersomnic.   ? ?Past Surgical History:  ?Procedure Laterality Date  ? ARTHRODESIS METATARSALPHALANGEAL JOINT (MTPJ) Right 07/28/2017  ? Procedure: ARTHRODESIS  RIGHT HALLUX METATARSALPHALANGEAL JOINT (MTPJ);  Surgeon: Wylene Simmer, MD;  Location: [facility];  Service: Orthopedics;  Laterality: Right;  ? Breast reconstructioin  7/09  ? flaps-bilat mastectomies  ? BTL    ? CARPAL TUNNEL RELEASE  2011  ? both hands  ? CARPOMETACARPEL SUSPENSION PLASTY Right 08/29/2014  ? Procedure: REMOVAL DERMASPAN SUSPENSIONPLASTY RIGHT THUMB ABDUCTOR POLLIS TRANSFER;  Surgeon: Daryll Brod, MD;  Location: [facility];  Service: Orthopedics;  Laterality: Right;  ? CATARACT EXTRACTION W/PHACO Right 12/17/2019  ? Procedure: CATARACT EXTRACTION PHACO AND INTRAOCULAR LENS PLACEMENT (IOC);  Surgeon: Baruch Goldmann, MD;  Location: [facility];  Service: Ophthalmology;  Laterality: Right;  CDE: 6.54  ? CATARACT EXTRACTION W/PHACO Left  12/31/2019  ? Procedure: CATARACT EXTRACTION PHACO AND INTRAOCULAR LENS PLACEMENT (IOC);  Surgeon: Baruch Goldmann, MD;  Location: [facility];  Service: Ophthalmology;  Laterality: Left;  CDE: 3.66  ? COLONOSCOPY W/ POLYPECTOMY  4/03  ? tubulovillous adenoma  ? FINGER ARTHROSCOPY WITH CARPOMETACARPEL [facility]) ARTHROPLASTY Right 06/20/2014  ? Procedure: ARTHROSCOPY RIGHT THUMB CARPOMETACARPEL GRAFT JACKET INTERPOSITION PARTIAL TRAPEZIECTOMY POSSIBLE OPEN;  Surgeon: Daryll Brod, MD;  Location: [place];  Service: Orthopedics;  Laterality: Right;  ? GANGLION CYST EXCISION    ? both [phone]  ? HAMMER TOE SURGERY Right 07/28/2017  ? Procedure: THIRD HAMMERTOE CORRECTION;  Surgeon: Wylene Simmer, MD;  Location: [place];  Service: Orthopedics;  Laterality: Right;  ? HARDWARE REMOVAL Right 07/28/2017  ? Procedure: REMOVAL OF DEEP IMPLANTS;  Surgeon: Wylene Simmer, MD;  Location: [place];  Service: Orthopedics;  Laterality: Right;  ? hysterestomy  2005  ? MASTECTOMY Bilateral 2008  ? post- a -cath  2008  ? right knee arthroscopy    ? TOTAL SHOULDER ARTHROPLASTY Left 01/13/2017  ? Procedure: LEFT TOTAL SHOULDER ARTHROPLASTY;  Surgeon: Tania Ade, MD;  Location: [place];  Service: Orthopedics;  Laterality: Left;  LEFT TOTAL SHOULDER ARTHROPLASTY  ? TRIGGER FINGER RELEASE Right 08/29/2014  ? Procedure: RELEASE TRIGGER FINGER/A-1 PULLEY;  Surgeon: Daryll Brod, MD;  Location: [place];  Service: Orthopedics;  Laterality: Right;  ? wisdom teeth out    ? ?Social History  ? ?Socioeconomic History  ? Marital status:  Married  ?  Spouse name: Leory Plowman  ? Number of children: 3  ? Years of education: college  ? Highest education level: Not on file  ?Occupational History  ? Occupation: disabled  ?  Comment: Currently in [place] at  [employer]  ?Tobacco Use  ? Smoking status: Former  ?  Packs/day: 0.25  ?  Years: 15.00  ?  Pack years: 3.75  ?  Types: Cigarettes  ?  Quit date:  06/14/2012  ?  Years since quitting: 9.6  ? Smokeless tobacco: Never  ? Tobacco comments:  ?  quit smoking in 1999  ?Vaping Use  ? Vaping Use: Never used  ?Substance and Sexual Activity  ? Alcohol use: Not Currently  ?  Comment: glass wine or beer "every once in a while" per pt.  ? Drug use: No  ? Sexual activity: Yes  ?  Partners: Male  ?  Birth control/protection: Surgical  ?Other Topics Concern  ? Not on file  ?Social History Narrative  ? Patient  Is married Leory Plowman) and lives at home with her husband.  ? Patient has 3 children.   ? Patient is disabled.   ? Patient has a college education.   ? Patient is right-handed.  ? Patient drinks one cup of coffee daily and one coke per day.  ? ?Social Determinants of Health  ? ?Financial Resource Strain: Not on file  ?Food Insecurity: Not on file  ?Transportation Needs: Not on file  ?Physical Activity: Not on file  ?Stress: Not on file  ?Social Connections: Not on file  ? ?Family History  ?Problem Relation Age of Onset  ? Breast cancer Mother   ? Prostate cancer Father   ? Breast cancer Maternal Aunt   ?     breast  ? Colon cancer Maternal Grandmother   ?     age unknown, ? 39's  ? Breast cancer Maternal Grandmother   ? Colon cancer Cousin   ? Healthy Brother   ? Healthy Son   ? Healthy Daughter   ? Healthy Daughter   ? Leukemia Granddaughter   ? ?Allergies  ?Allergen Reactions  ? Sulfonamide Derivatives Hives and Other (See Comments)  ?  FEVER  ? ?Prior to Admission medications   ?Medication Sig Start Date End Date Taking? Authorizing Provider  ?ARIPiprazole (ABILIFY) 20 MG tablet Take 20 mg by mouth daily. 07/10/19  Yes [provider]  ?benztropine (COGENTIN) 1 MG tablet Take 1 mg by mouth at bedtime.  08/25/16  Yes [provider]  ?cyclobenzaprine (FLEXERIL) 5 MG tablet Take 5 mg by mouth 3 (three) times daily as needed for muscle spasms. 09/15/21  Yes [provider]  ?diclofenac sodium (VOLTAREN) 1 % GEL Apply 1 application topically 4 (four)  times daily as needed (pain.).    Yes [provider]  ?escitalopram (LEXAPRO) 20 MG tablet Take by mouth in the morning and at bedtime.   Yes [provider]  ?LINZESS 145 MCG CAPS capsule Take 145 mcg by mouth daily as needed for constipation. 01/28/22  Yes [provider]  ?Melatonin 5 MG TABS Take 5 mg by mouth at bedtime.   Yes [provider]  ?methylphenidate (RITALIN LA) 30 MG 24 hr capsule Take 30 mg by mouth in the morning. 09/06/16  Yes [provider]  ?OZEMPIC, 1 MG/DOSE, 2 MG/1.5ML SOPN Inject 1 mg into the skin every Sunday.  11/18/19  Yes [provider]  ?pregabalin (LYRICA) 150 MG capsule Take  150 mg by mouth 2 (two) times daily.   Yes [provider]  ?propranolol ER (INDERAL LA) 120 MG 24 hr capsule Take 120 mg by mouth 2 (two) times daily.  08/08/19  Yes [provider]  ?Tapentadol HCl 150 MG TB12 Take 150 mg by mouth 2 (two) times daily. 06/18/19  Yes [provider]  ?traMADol (ULTRAM) 50 MG tablet Take 50 mg by mouth every 6 (six) hours as needed for moderate pain.   Yes [provider]  ?busPIRone (BUSPAR) 10 MG tablet Take 10 mg by mouth 3 (three) times daily as needed.    [provider]  ?methocarbamol (ROBAXIN) 500 MG tablet Take 500 mg by mouth 4 (four) times daily as needed for muscle spasms.    [provider]  ? ?ECHOCARDIOGRAM COMPLETE ? ?Result Date: 02/10/2022 ?   ECHOCARDIOGRAM REPORT   Patient Name:   ANELIESE BEAUDRY Date of Exam: 02/10/2022 Medical Rec #:  [id number]       Height:       64.0 in Accession #:    [id number]      Weight:       164.4 lb Date of Birth:  [birth year]      BSA:          1.800 m? Patient Age:    61 years        BP:           92/52 mmHg Patient Gender: F               HR:           73 bpm. Exam Location:  [facility] Procedure: 2D Echo, Cardiac Doppler and Color Doppler Indications:    Preoperative cardiovascular examination [Z01.810 (ICD-10-CM)];                  LBBB (left bundle branch block) [I44.7 (ICD-10-CM)]  History:        Patient has no prior history of Echocardiogram examinations.  Sonographer:    Philipp Deputy RDCS Referring Phys: [place] MCDOWELL  Sonogra

## 2022-02-11 NOTE — Transfer of Care (Signed)
Immediate Anesthesia Transfer of Care Note ? ?Patient: [name] ? ?Procedure(s) Performed: TOTAL SHOULDER ARTHROPLASTY (Right: Shoulder) ? ?Patient Location: PACU ? ?Anesthesia Type:General ? ?Level of Consciousness: drowsy and patient cooperative ? ?Airway & Oxygen Therapy: Patient Spontanous Breathing and Patient connected to face mask oxygen ? ?Post-op Assessment: Report given to RN and Post -op Vital signs reviewed and stable ? ?Post vital signs: Reviewed and stable ? ?Last Vitals:  ?Vitals Value Taken Time  ?BP 120/66 02/11/22 1300  ?Temp 36.3 ?C 02/11/22 1300  ?Pulse 68 02/11/22 1302  ?Resp 11 02/11/22 1302  ?SpO2 99 % 02/11/22 1302  ?Vitals shown include unvalidated device data. ? ?Last Pain:  ?Vitals:  ? 02/11/22 1300  ?TempSrc:   ?PainSc: 0-No pain  ?   ? ?Patients Stated Pain Goal: 2 (02/11/22 0951) ? ?Complications: No notable events documented. ?

## 2022-02-12 DIAGNOSIS — M19011 Primary osteoarthritis, right shoulder: Secondary | ICD-10-CM | POA: Diagnosis not present

## 2022-02-12 LAB — HEMOGLOBIN A1C
Hgb A1c MFr Bld: 5.5 % (ref 4.8–5.6)
Mean Plasma Glucose: 111.15 mg/dL

## 2022-02-12 MED ORDER — OXYCODONE-ACETAMINOPHEN 5-325 MG PO TABS: 1.0000 | ORAL_TABLET | ORAL | 0 refills | Status: DC | PRN

## 2022-02-12 MED ORDER — ONDANSETRON HCL 4 MG PO TABS: 4.0000 mg | ORAL_TABLET | Freq: Three times a day (TID) | ORAL | 1 refills | Status: DC | PRN

## 2022-02-12 MED ORDER — METHOCARBAMOL 500 MG PO TABS: 500.0000 mg | ORAL_TABLET | Freq: Four times a day (QID) | ORAL | 0 refills | Status: DC

## 2022-02-12 NOTE — Evaluation (Signed)
Occupational Therapy Evaluation ?Patient Details ?Name: [name] ?MRN: [id number] ?DOB: May 15, 1961 ?Today's Date: 02/12/2022 ? ? ?History of Present Illness 61 yo F s/p R TSA.  PMH includes: Fibromyalgia      Hypothyroidism     Migraine headache     Narcolepsy     OSA on CPAP, L TSA  ? ?Clinical Impression ?  ?Patient admitted for the procedure above.  PTA she lives at home with her spouse, who is able to assist as needed.  Shoulder restrictions and pain are the limitations.  Currently she is needing expected Min A for ADL completion, and generalized supervision with mobility.  Precaution sheet issued and reviewed, elbow distal HEP issued and performed, sling management reviewed, and proper positioning of operative shoulder in sitting and supine reviewed.  All questions answered, and patient is preparing for discharge.     ?   ? ?Recommendations for follow up therapy are one component of a multi-disciplinary discharge planning process, led by the attending physician.  Recommendations may be updated based on patient status, additional functional criteria and insurance authorization.  ? ?Follow Up Recommendations ? Follow physician's recommendations for discharge plan and follow up therapies  ?  ?Assistance Recommended at Discharge Intermittent Supervision/Assistance  ?Patient can return home with the following   ? ?  ?Functional Status Assessment ? Patient has had a recent decline in their functional status and demonstrates the ability to make significant improvements in function in a reasonable and predictable amount of time.  ?Equipment Recommendations ? None recommended by OT  ?  ?Recommendations for Other Services   ? ? ?  ?Precautions / Restrictions Precautions ?Precautions: Shoulder ?Type of Shoulder Precautions: NWB, no specific orders for allowed shoulder ROM.  Advised patient AROM elbow distal, no pushing, no pulling, no AROM at shoulder for R UE. ?Shoulder Interventions: Shoulder  sling/immobilizer ?Precaution Booklet Issued: Yes (comment) ?Required Braces or Orthoses: Sling ?Restrictions ?Weight Bearing Restrictions: Yes ?RUE Weight Bearing: Non weight bearing  ? ?  ? ?Mobility Bed Mobility ?Overal bed mobility: Needs Assistance ?Bed Mobility: Supine to Sit ?  ?  ?Supine to sit: Min guard ?  ?  ?  ?  ? ?Transfers ?Overall transfer level: Needs assistance ?  ?Transfers: Sit to/from Stand, Bed to chair/wheelchair/BSC ?Sit to Stand: Supervision ?  ?  ?Step pivot transfers: Supervision ?  ?  ?  ?  ? ?  ?Balance Overall balance assessment: Mild deficits observed, not formally tested ?  ?  ?  ?  ?  ?  ?  ?  ?  ?  ?  ?  ?  ?  ?  ?  ?  ?  ?   ? ?ADL either performed or assessed with clinical judgement  ? ?ADL   ?  ?  ?  ?  ?  ?  ?  ?  ?Upper Body Dressing : Minimal assistance;Standing ?  ?Lower Body Dressing: Minimal assistance;Sit to/from stand ?  ?Toilet Transfer: Sales executive;Ambulation ?  ?  ?  ?  ?  ?  ?   ? ? ? ?Vision Patient Visual Report: No change from baseline ?   ?   ?Perception Perception ?Perception: Not tested ?  ?Praxis Praxis ?Praxis: Not tested ?  ? ?Pertinent Vitals/Pain Pain Assessment ?Pain Assessment: Faces ?Faces Pain Scale: Hurts little more ?Pain Location: R shoulder ?Pain Descriptors / Indicators: Sharp ?Pain Intervention(s): Monitored during session  ? ? ? ?Hand Dominance Right ?  ?Extremity/Trunk Assessment Upper  Extremity Assessment ?Upper Extremity Assessment: RUE deficits/detail ?RUE: Unable to fully assess due to immobilization ?RUE Sensation: WNL ?RUE Coordination: WNL ?  ?Lower Extremity Assessment ?Lower Extremity Assessment: Overall WFL for tasks assessed ?  ?Cervical / Trunk Assessment ?Cervical / Trunk Assessment: Normal ?  ?Communication Communication ?Communication: No difficulties ?  ?Cognition Arousal/Alertness: Lethargic, Suspect due to medications ?Behavior During Therapy: [facility] for tasks assessed/performed ?Overall Cognitive Status:  Within Functional Limits for tasks assessed ?  ?  ?  ?  ?  ?  ?  ?  ?  ?  ?  ?  ?  ?  ?  ?  ?  ?  ?  ?General Comments   VSS on RA ? ?  ?Exercises   ?  ?Shoulder Instructions    ? ? ?Home Living Family/patient expects to be discharged to:: Private residence ?Living Arrangements: Spouse/significant other ?Available Help at Discharge: Family;Available 24 hours/day ?Type of Home: House ?Home Access: Stairs to enter ?  ?  ?Home Layout: One level ?  ?  ?Bathroom Shower/Tub: Walk-in shower ?  ?Bathroom Toilet: Standard ?  ?  ?Home Equipment: Conservation officer, nature (2 wheels);Cane - single point ?  ?  ?  ? ?  ?Prior Functioning/Environment Prior Level of Function : Independent/Modified Independent ?  ?  ?  ?  ?  ?  ?  ?  ?  ? ?  ?  ?OT Problem List: Decreased range of motion;Impaired balance (sitting and/or standing);Pain ?  ?   ?OT Treatment/Interventions:    ?  ?OT Goals(Current goals can be found in the care plan section) Acute Rehab OT Goals ?Patient Stated Goal: Return home ?OT Goal Formulation: With patient ?Time For Goal Achievement: 02/15/22 ?Potential to Achieve Goals: Good  ?OT Frequency:   ?  ? ?Co-evaluation   ?  ?  ?  ?  ? ?  ?AM-PAC OT "6 Clicks" Daily Activity     ?Outcome Measure Help from another person eating meals?: A Little ?Help from another person taking care of personal grooming?: A Little ?Help from another person toileting, which includes using toliet, bedpan, or urinal?: A Little ?Help from another person bathing (including washing, rinsing, drying)?: A Little ?Help from another person to put on and taking off regular upper body clothing?: A Little ?Help from another person to put on and taking off regular lower body clothing?: A Little ?6 Click Score: 18 ?  ?End of Session Nurse Communication: Mobility status ? ?Activity Tolerance: Patient tolerated treatment well ?Patient left: in chair;with family/visitor present ? ?OT Visit Diagnosis: Unsteadiness on feet (R26.81);Pain ?Pain - Right/Left: Right ?Pain  - part of body: Shoulder  ?              ?Time: 2119-4174 ?OT Time Calculation (min): 30 min ?Charges:  OT General Charges ?$OT Visit: 1 Visit ?OT Evaluation ?$OT Eval Moderate Complexity: 1 Mod ?OT Treatments ?$Self Care/Home Management : 8-22 mins ? ?02/12/2022 ? ?RP, OTR/L ? ?Acute Rehabilitation Services ? ?Office:  [phone] ? ? ?Dallen Bunte D Shreeya Recendiz ?02/12/2022, 2:24 PM ?

## 2022-02-12 NOTE — Plan of Care (Signed)
  Problem: Coping: Goal: Level of anxiety will decrease Outcome: Progressing   Problem: Elimination: Goal: Will not experience complications related to bowel motility Outcome: Progressing   Problem: Pain Managment: Goal: General experience of comfort will improve Outcome: Progressing   Problem: Safety: Goal: Ability to remain free from injury will improve Outcome: Progressing   

## 2022-02-12 NOTE — Discharge Summary (Signed)
?Patient ID: ?[name] ?MRN: [id number] ?DOB/AGE: May 19, 1961 61 y.o. ? ?Admit date: 02/11/2022 ?Discharge date: 02/12/2022 ? ?Primary Diagnosis: Right glenohumeral osteoarthritis ?Admission Diagnoses: Status post right total shoulder arthroplasty ?Past Medical History:  ?Diagnosis Date  ? Anxiety   ? Arthritis   ? Breast cancer ([place])   ? Status post bilateral mastectomy  ? Breast reconstruction deformity   ? Complication of anesthesia   ? shaking-after mastectomies-resp and hr dropped  ? Depression, major   ? suicidal thoughts -implants, mental health, 05/11  ? Fibromyalgia   ? Hypothyroidism   ? Migraine headache   ? Narcolepsy   ? OSA on CPAP   ? Sleep apnea with use of continuous positive airway pressure (CPAP)   ? AHI 20 in HST, titration to 6 cm water - residual AHi 6 . remaining hypersomnic.   ? ?Discharge Diagnoses:   ?Principal Problem: ?  History of total shoulder replacement, right ? ?Estimated body mass index is 28.32 kg/m? as calculated from the following: ?  Height as of this encounter: '5\' 4"'$  (1.626 m). ?  Weight as of this encounter: 74.8 kg. ? ?Procedure:  ?Procedure(s) (LRB): ?TOTAL SHOULDER ARTHROPLASTY (Right)  ? ?Consults: None ? ?HPI: [name] is a 61 year old female who presented to the operating room on 02/11/2022 for elective right total shoulder arthroplasty.  Patient was seen in our office initially where she was evaluated and after undergoing conservative treatment patient elected proceed with definitive treatment of her right shoulder osteoarthritis with a right total shoulder arthroplasty.  She was admitted to the hospital for overnight monitoring and pain control. ? ?Laboratory Data: ?Admission on 02/11/2022  ?Component Date Value Ref Range Status  ? Hgb A1c MFr Bld 02/12/2022 5.5  4.8 - 5.6 % Final  ? Comment: (NOTE) ?Pre diabetes:          5.7%-6.4% ? ?Diabetes:              >6.4% ? ?Glycemic control for   <7.0% ?adults with diabetes ?  ? Mean Plasma Glucose 02/12/2022 111.15   mg/dL Final  ? Performed at [facility] [address], [place], [place] [address]  ?Appointment on 02/10/2022  ?Component Date Value Ref Range Status  ? Weight 02/10/2022 2,630.4  oz Final  ? Height 02/10/2022 64  in Final  ? BP 02/10/2022 92/52  mmHg Final  ? S' Lateral 02/10/2022 3.33  cm Final  ? Area-P 1/2 02/10/2022 5.84  cm2 Final  ?Hospital Outpatient Visit on 02/08/2022  ?Component Date Value Ref Range Status  ? MRSA, PCR 02/08/2022 NEGATIVE  NEGATIVE Final  ? Staphylococcus aureus 02/08/2022 POSITIVE (A)  NEGATIVE Final  ? Comment: (NOTE) ?The Xpert SA Assay (FDA approved for NASAL specimens in patients 79 ?years of age and older), is one component of a comprehensive ?surveillance program. It is not intended to diagnose infection nor to ?guide or monitor treatment. ?Performed at [facility], [place] [address], [place], [place] ?[address] ?  ? Sodium 02/08/2022 139  135 - 145 mmol/L Final  ? Potassium 02/08/2022 3.8  3.5 - 5.1 mmol/L Final  ? Chloride 02/08/2022 100  98 - 111 mmol/L Final  ? CO2 02/08/2022 29  22 - 32 mmol/L Final  ? Glucose, Bld 02/08/2022 101 (H)  70 - 99 mg/dL Final  ? Glucose reference range applies only to samples taken after fasting for at least 8 hours.  ? BUN 02/08/2022 19  8 - 23 mg/dL Final  ?  Creatinine, Ser 02/08/2022 0.94  0.44 - 1.00 mg/dL Final  ? Calcium 02/08/2022 9.8  8.9 - 10.3 mg/dL Final  ? GFR, Estimated 02/08/2022 >60  >60 mL/min Final  ? Comment: (NOTE) ?Calculated using the CKD-EPI Creatinine Equation (2021) ?  ? Anion gap 02/08/2022 10  5 - 15 Final  ? Performed at [facility] [address], [place], [place] [address]  ? WBC 02/08/2022 8.7  4.0 - 10.5 K/uL Final  ? RBC 02/08/2022 4.97  3.87 - 5.11 MIL/uL Final  ? Hemoglobin 02/08/2022 14.0  12.0 - 15.0 g/dL Final  ? HCT 02/08/2022 42.8  36.0 - 46.0 % Final  ? MCV 02/08/2022 86.1  80.0 - 100.0 fL Final  ? MCH 02/08/2022 28.2  26.0 - 34.0 pg Final  ? MCHC 02/08/2022 32.7  30.0 - 36.0 g/dL Final   ? RDW 02/08/2022 14.0  11.5 - 15.5 % Final  ? Platelets 02/08/2022 282  150 - 400 K/uL Final  ? nRBC 02/08/2022 0.0  0.0 - 0.2 % Final  ? Performed at [facility], [place] [address], [place], [place] [address]  ?  ? ?X-Rays:DG Shoulder Right Port ? ?Result Date: 02/11/2022 ?CLINICAL DATA:  Post total shoulder arthroplasty EXAM: RIGHT SHOULDER - 1 VIEW COMPARISON:  Portable exam 1414 hours without priors for comparison FINDINGS: Osseous demineralization. RIGHT shoulder prosthesis. AC joint alignment normal. No fracture, dislocation, or bone destruction IMPRESSION: RIGHT shoulder prosthesis without acute abnormalities. Electronically Signed   By: Lavonia Dana M.D.   On: 02/11/2022 14:24  ? ?ECHOCARDIOGRAM COMPLETE ? ?Result Date: 02/10/2022 ?   ECHOCARDIOGRAM REPORT   Patient Name:   [name] Date of Exam: 02/10/2022 Medical Rec #:  [id number]       Height:       64.0 in Accession #:    [id number]      Weight:       164.4 lb Date of Birth:  1961-09-03      BSA:          1.800 m? Patient Age:    62 years        BP:           92/52 mmHg Patient Gender: F               HR:           73 bpm. Exam Location:  [facility] Procedure: 2D Echo, Cardiac Doppler and Color Doppler Indications:    Preoperative cardiovascular examination [Z01.810 (ICD-10-CM)];                 LBBB (left bundle branch block) [I44.7 (ICD-10-CM)]  History:        Patient has no prior history of Echocardiogram examinations.  Sonographer:    Philipp Deputy RDCS Referring Phys: [place] Comments: Image acquisition challenging due to mastectomy, Image acquisition challenging due to patient body habitus and Image acquisition challenging due to uncooperative patient. IMPRESSIONS  1. Left ventricular ejection fraction, by estimation, is 50 to 55%. The left ventricle has low normal function. The left ventricle has no regional wall motion abnormalities. Septal motion consistent with left bundle branch block. Left ventricular diastolic  parameters are consistent with Grade I diastolic dysfunction (impaired relaxation).  2. Right ventricular systolic function is normal. The right ventricular size is normal. Tricuspid regurgitation signal is inadequate for assessing PA pressure.  3. The mitral valve is grossly normal. No evidence of mitral valve regurgitation.  4. The aortic valve is tricuspid. Aortic valve regurgitation is not visualized.  5. The inferior vena cava is normal in size with greater than 50% respiratory variability, suggesting right atrial pressure of 3 mmHg. Comparison(s): Prior images unable to be directly viewed. FINDINGS  Left Ventricle: Left ventricular ejection fraction, by estimation, is 50 to 55%. The left ventricle has low normal function. The left ventricle has no regional wall motion abnormalities. The left ventricular internal cavity size was normal in size. There is no left ventricular hypertrophy. Abnormal (paradoxical) septal motion, consistent with left bundle branch block. Left ventricular diastolic parameters are consistent with Grade I diastolic dysfunction (impaired relaxation). Right Ventricle: The right ventricular size is normal. No increase in right ventricular wall thickness. Right ventricular systolic function is normal. Tricuspid regurgitation signal is inadequate for assessing PA pressure. Left Atrium: Left atrial size was normal in size. Right Atrium: Right atrial size was normal in size. Pericardium: There is no evidence of pericardial effusion. Presence of epicardial fat layer. Mitral Valve: The mitral valve is grossly normal. Mild mitral annular calcification. No evidence of mitral valve regurgitation. Tricuspid Valve: The tricuspid valve is grossly normal. Tricuspid valve regurgitation is trivial. Aortic Valve: The aortic valve is tricuspid. There is mild aortic valve annular calcification. Aortic valve regurgitation is not visualized. Pulmonic Valve: The pulmonic valve was grossly normal. Pulmonic valve  regurgitation is trivial. Aorta: The aortic root is normal in size and structure. Venous: The inferior vena cava is normal in size with greater than 50% respiratory variability, suggesting right atria

## 2022-02-15 ENCOUNTER — Encounter (HOSPITAL_COMMUNITY): Payer: Self-pay | Admitting: Orthopedic Surgery

## 2022-03-08 ENCOUNTER — Ambulatory Visit (HOSPITAL_COMMUNITY): Payer: No Typology Code available for payment source | Attending: Orthopedic Surgery

## 2022-03-08 ENCOUNTER — Encounter (HOSPITAL_COMMUNITY): Payer: Self-pay

## 2022-03-08 DIAGNOSIS — M25611 Stiffness of right shoulder, not elsewhere classified: Secondary | ICD-10-CM | POA: Diagnosis present

## 2022-03-08 DIAGNOSIS — M25511 Pain in right shoulder: Secondary | ICD-10-CM | POA: Diagnosis present

## 2022-03-08 DIAGNOSIS — R29898 Other symptoms and signs involving the musculoskeletal system: Secondary | ICD-10-CM | POA: Diagnosis present

## 2022-03-08 NOTE — Therapy (Addendum)
OUTPATIENT OCCUPATIONAL THERAPY ORTHO EVALUATION  Patient Name: [name] MRN: [id number] DOB:1961/10/23, 61 y.o., female Today's Date: 03/08/2022  PCP: Sable Feil MD REFERRING PROVIDER: Nicholes Stairs, MD    03/08/22 1625  OT Visits / Re-Eval  Visit Number 1  Number of Visits 16  Date for OT Re-Evaluation 05/03/22  Authorization  Authorization Type [facility] Preferred  Authorization Time Period No copay. 90 combined visits (3 visits used). no auth needed  Authorization - Visit Number 4  Authorization - Number of Visits 90  Progress Note Due on Visit 10  OT Time Calculation  OT Start Time 1044 (Pt arrived late)  OT Stop Time 1117  OT Time Calculation (min) 33 min  End of Session  Activity Tolerance Patient tolerated treatment well  Behavior During Therapy WFL for tasks assessed/performed    Past Medical History:  Diagnosis Date   Anxiety    Arthritis    Breast cancer ([place])    Status post bilateral mastectomy   Breast reconstruction deformity    Complication of anesthesia    shaking-after mastectomies-resp and hr dropped   Depression, major    suicidal thoughts -implants, mental health, 05/11   Fibromyalgia    Hypothyroidism    Migraine headache    Narcolepsy    OSA on CPAP    Sleep apnea with use of continuous positive airway pressure (CPAP)    AHI 20 in HST, titration to 6 cm water - residual AHi 6 . remaining hypersomnic.    Past Surgical History:  Procedure Laterality Date   ARTHRODESIS METATARSALPHALANGEAL JOINT (MTPJ) Right 07/28/2017   Procedure: ARTHRODESIS  RIGHT HALLUX METATARSALPHALANGEAL JOINT (MTPJ);  Surgeon: Wylene Simmer, MD;  Location: [place];  Service: Orthopedics;  Laterality: Right;   Breast reconstructioin  7/09   flaps-bilat mastectomies   BTL     CARPAL TUNNEL RELEASE  2011   both hands   CARPOMETACARPEL SUSPENSION PLASTY Right 08/29/2014   Procedure: REMOVAL DERMASPAN SUSPENSIONPLASTY RIGHT  THUMB ABDUCTOR POLLIS TRANSFER;  Surgeon: Daryll Brod, MD;  Location: [place];  Service: Orthopedics;  Laterality: Right;   CATARACT EXTRACTION W/PHACO Right 12/17/2019   Procedure: CATARACT EXTRACTION PHACO AND INTRAOCULAR LENS PLACEMENT (IOC);  Surgeon: Baruch Goldmann, MD;  Location: [facility];  Service: Ophthalmology;  Laterality: Right;  CDE: 6.54   CATARACT EXTRACTION W/PHACO Left 12/31/2019   Procedure: CATARACT EXTRACTION PHACO AND INTRAOCULAR LENS PLACEMENT (IOC);  Surgeon: Baruch Goldmann, MD;  Location: [facility];  Service: Ophthalmology;  Laterality: Left;  CDE: 3.66   COLONOSCOPY W/ POLYPECTOMY  4/03   tubulovillous adenoma   FINGER ARTHROSCOPY WITH CARPOMETACARPEL ([place]) ARTHROPLASTY Right 06/20/2014   Procedure: ARTHROSCOPY RIGHT THUMB CARPOMETACARPEL GRAFT JACKET INTERPOSITION PARTIAL TRAPEZIECTOMY POSSIBLE OPEN;  Surgeon: Daryll Brod, MD;  Location: [place];  Service: Orthopedics;  Laterality: Right;   GANGLION CYST EXCISION     both [phone]   HAMMER TOE SURGERY Right 07/28/2017   Procedure: THIRD HAMMERTOE CORRECTION;  Surgeon: Wylene Simmer, MD;  Location: [place];  Service: Orthopedics;  Laterality: Right;   HARDWARE REMOVAL Right 07/28/2017   Procedure: REMOVAL OF DEEP IMPLANTS;  Surgeon: Wylene Simmer, MD;  Location: [place];  Service: Orthopedics;  Laterality: Right;   hysterestomy  2005   MASTECTOMY Bilateral 2008   post- a -cath  2008   right knee arthroscopy     TOTAL SHOULDER ARTHROPLASTY Left 01/13/2017   Procedure: LEFT TOTAL SHOULDER ARTHROPLASTY;  Surgeon:  Tania Ade, MD;  Location: [place];  Service: Orthopedics;  Laterality: Left;  LEFT TOTAL SHOULDER ARTHROPLASTY   TOTAL SHOULDER ARTHROPLASTY Right 02/11/2022   Procedure: TOTAL SHOULDER ARTHROPLASTY;  Surgeon: Nicholes Stairs, MD;  Location: [place];  Service: Orthopedics;  Laterality: Right;  150   TRIGGER FINGER RELEASE Right 08/29/2014   Procedure:  RELEASE TRIGGER FINGER/A-1 PULLEY;  Surgeon: Daryll Brod, MD;  Location: [place];  Service: Orthopedics;  Laterality: Right;   wisdom teeth out     Patient Active Problem List   Diagnosis Date Noted   History of total shoulder replacement, right 02/11/2022   Osteoarthritis of left shoulder 01/13/2017   S/P shoulder replacement, left 01/13/2017   OSA on CPAP 04/01/2015   Subacute confusional state 04/01/2015   Hallucinations, visual 04/01/2015   Hypnapompic hallucinations 04/01/2015   Sleep apnea with use of continuous positive airway pressure (CPAP)    Hypersomnia, persistent 03/30/2013   Obstructive sleep apnea (adult) (pediatric) 03/30/2013   CONSTIPATION 11/18/2008   RECTAL BLEEDING 11/18/2008   HEMORRHOIDS, INTERNAL 11/15/2008   DIVERTICULOSIS, COLON 11/15/2008   CELLULITIS/ABSCESS, TRUNK 02/22/2008   LOW BACK PAIN 02/22/2008   DIZZINESS 02/22/2008   FATIGUE 02/22/2008   PALPITATIONS 02/22/2008   MURMUR, CARDIAC, UNDIAGNOSED 02/22/2008   BREAST CANCER, HX OF 02/22/2008   COUGH 01/05/2008   DEPRESSION 07/05/2007   UTERINE POLYP 07/05/2007   FIBROMYALGIA 07/05/2007   COLONIC POLYPS, HX OF 07/05/2007   HX, PERSONAL, CERVICAL DYSPLASIA 07/05/2007    ONSET DATE: 02/11/22  REFERRING DIAG: S/P right total shoulder arthroplasty  THERAPY DIAG:  Other symptoms and signs involving the musculoskeletal system  Acute pain of right shoulder  Stiffness of right shoulder, not elsewhere classified  SUBJECTIVE:   SUBJECTIVE STATEMENT: S: If I move it the wrong way it will hurt. Pt accompanied by: self  PERTINENT HISTORY: Patient with OA of the right shoulder underwent a right total shoulder arthroplasty on 02/11/22 completed by Dr. Stann Mainland. At follow up appointment on 02/24/22, Dr. Stann Mainland encouraged patient to wear her sling another 3 weeks for a total of 5 weeks (03/17/22). Patient arrived to clinic without sling.  Follow up for Dr. Stann Mainland 04/01/22  PRECAUTIONS:  Shoulder At 6 weeks: AA/ROM, gently increasing P/ROM, A/ROM when ROM has improved and is close to full. At 12 weeks: begin strengthening  WEIGHT BEARING RESTRICTIONS Yes NWB RUE  PAIN:  Are you having pain? Yes: NPRS scale: 5/10 Pain location: right shoulder posterior and anterior Pain description: constant, pulling, sore, tight Aggravating factors: moving the wrong way Relieving factors: NSAIDS , ice  FALLS: Has patient fallen in last 6 months? No  LIVING ENVIRONMENT: Lives with: lives with their family and lives with their spouse   PLOF: Independent  PATIENT GOALS To use her right arm with all daily tasks again.  OBJECTIVE:   HAND DOMINANCE: Right  ADLs: Overall ADLs: Unable to use RUE for any daily tasks.  FUNCTIONAL OUTCOME MEASURES: FOTO: complete next session  UE ROM     Passive ROM Right 03/08/2022  Shoulder flexion 126  Shoulder abduction 136  Shoulder internal rotation 90  Shoulder external rotation 40  (Blank rows = not tested)   UE MMT:     MMT- Assessed seated during functional task. Right 03/08/2022  Shoulder flexion 3-/5  Shoulder abduction 3-/5  Shoulder internal rotation 3/5  Shoulder external rotation 3-5  (Blank rows = not tested)    COGNITION: Overall cognitive status: Within functional limits for tasks assessed  OBSERVATIONS: Max fascial restrictions noted in right upper arm, upper trapezius, and scapularis region.    TODAY'S TREATMENT:  Reviewed MD's precautions from previous following.    PATIENT EDUCATION: Education details: Shoulder precautions. What procedure was performed and what was done. HEP: table slides Person educated: Patient Education method: Explanation, Demonstration, Verbal cues, and Handouts Education comprehension: verbalized understanding   HOME EXERCISE PROGRAM: Eval: table slides  GOALS:  SHORT TERM GOALS: Target date: 04/05/2022  Pt will be educated and independent with HEP in order to increase her  ability to use her right UE as her dominant extremity for 50% or more of her daily tasks.  Baseline: Goal status: INITIAL  2.  Pt will increase her RUE P/ROM to [facility] in order to increase ability to complete upper body dressing tasks with less difficulty.  Baseline:  Goal status: INITIAL  3.  Pt will increase her RUE strength to 3+/5 in order to complete activities at or below her shoulder level with increased shoulder and scapular stability demonstrated.  Baseline:  Goal status: INITIAL  4.  Pt will decrease her RUE fascial restrictions to moderate amount or less in order to increase the functional mobility needed to complete functional reaching tasks at home with less difficulty.  Baseline:  Goal status: INITIAL  5.  Pt will report a decrease in pain level of approximately 5/10 when using her RUE for simple light ADL tasks. Baseline:  Goal status: INITIAL   LONG TERM GOALS: Target date: 05/03/2022  Pt will return to using her RUE as her dominant extremity for all daily tasks.  Baseline:  Goal status: INITIAL  2.  Pt will increase her RUE A/ROM to [facility] in order to reach above her shoulder level to retrieve items out of a cabinet. Baseline:  Goal status: INITIAL  3.  Pt will will increase her RUE strength to 4+/5 in order to manage weight of moderate weight using both arms. Baseline:  Goal status: INITIAL  4.  Pt will report a decrease in pain level in her right shoulder of approximately 3/10 or less when using it to complete daily tasks.  Baseline:  Goal status: INITIAL  5.  Pt will decrease her RUE fascial restrictions to minimal amount or less in order to increase the functional mobility needed to complete higher level reaching tasks.  Baseline:  Goal status: INITIAL   ASSESSMENT:  CLINICAL IMPRESSION: Patient is a 61 y.o. female who was seen today for occupational therapy evaluation for S/P right total shoulder arthroplasty causing increased fascial restrictions, pain  and decreased ROM and strength resulting in the inability to using her RUE as her dominant extremity to complete tasks at home.   PERFORMANCE DEFICITS in functional skills including ADLs, IADLs, edema, ROM, strength, pain, fascial restrictions, mobility, decreased knowledge of precautions, and UE functional use. IMPAIRMENTS are limiting patient from ADLs, IADLs, and leisure.   COMORBIDITIES has co-morbidities such as fibromyalgia   that affects occupational performance. Patient will benefit from skilled OT to address above impairments and improve overall function.  MODIFICATION OR ASSISTANCE TO COMPLETE EVALUATION: No modification of tasks or assist necessary to complete an evaluation.  OT OCCUPATIONAL PROFILE AND HISTORY: Problem focused assessment: Including review of records relating to presenting problem.  CLINICAL DECISION MAKING: Moderate - several treatment options, min-mod task modification necessary  REHAB POTENTIAL: Good  EVALUATION COMPLEXITY: Low      PLAN: OT FREQUENCY: 2x/week  OT DURATION: 8 weeks  PLANNED INTERVENTIONS: self care/ADL training, therapeutic exercise,  therapeutic activity, neuromuscular re-education, manual therapy, passive range of motion, electrical stimulation, ultrasound, moist heat, cryotherapy, patient/family education, coping strategies training, and DME and/or AE instructions    CONSULTED AND AGREED WITH PLAN OF CARE: Patient  PLAN FOR NEXT SESSION: P: COMPLETE FOTO. Start myofascial release, passive ROM, low intensity AA/ROM. Update HEP when needed.   Ailene Ravel, OTR/L,CBIS  [phone]  03/08/2022, 10:54 AM

## 2022-03-08 NOTE — Patient Instructions (Signed)
Continue to wear the sling at all times during the day until Wednesday May 10th. ? ?*OK to use either ice or heat for pain management. Heat is very helpful to decrease tight muscles.  ? ? ?Complete the following 2-3 times day.  ? ?1) SHOULDER: Flexion On Table ? ? ?Place hands on towel placed on table, elbows straight. Lean forward with you upper body, pushing towel away from body.  _10-12__ reps per set. ? ?2) Abduction (Passive) ? ? ?With arm out to side, resting on towel placed on table with palm DOWN, keeping trunk away from table, lean to the side while pushing towel away from body.  ?Repeat __10-12__ times. ? ?Copyright ? V[employer]. All rights reserved.  ? ? ? ?3) Internal Rotation (Assistive) ? ? ?Seated with elbow bent at right angle and held against side, slide arm on table surface in an inward arc keeping elbow anchored in place. ?Repeat _10-12___ times. Activity: Use this motion to brush crumbs off the table. ? ?Copyright ? VHI. All rights reserved.   ? ? ? ? ?

## 2022-03-15 ENCOUNTER — Ambulatory Visit (HOSPITAL_COMMUNITY): Payer: No Typology Code available for payment source | Admitting: Occupational Therapy

## 2022-03-15 ENCOUNTER — Encounter (HOSPITAL_COMMUNITY): Payer: Self-pay | Admitting: Occupational Therapy

## 2022-03-15 DIAGNOSIS — R29898 Other symptoms and signs involving the musculoskeletal system: Secondary | ICD-10-CM | POA: Diagnosis not present

## 2022-03-15 DIAGNOSIS — M25611 Stiffness of right shoulder, not elsewhere classified: Secondary | ICD-10-CM

## 2022-03-15 DIAGNOSIS — M25511 Pain in right shoulder: Secondary | ICD-10-CM

## 2022-03-15 NOTE — Therapy (Addendum)
?OUTPATIENT OCCUPATIONAL THERAPY TREATMENT NOTE ? ? ?Patient Name: [name] ?MRN: [id number] ?DOB:[birth year], 61 y.o., female ?Today's Date: 03/15/2022 ? ?PCP: Sable Feil MD ?REFERRING PROVIDER: Nicholes Stairs, MD ?  ?END OF SESSION:  ? OT End of Session - 03/15/22 1218   ? ? Visit Number 2   ? Number of Visits 16   ? Date for OT Re-Evaluation 05/03/22   ? Authorization Type [facility] Preferred   ? Authorization Time Period No copay. 90 combined visits (3 visits used). no auth needed   ? Authorization - Visit Number 5   ? Authorization - Number of Visits 90   ? Progress Note Due on Visit 10   ? OT Start Time 1036   Pt arrived slightly late  ? OT Stop Time 1117   ? OT Time Calculation (min) 41 min   ? Activity Tolerance Patient tolerated treatment well   ? Behavior During Therapy [facility] for tasks assessed/performed   ? ?  ?  ? ?  ? ? ?Past Medical History:  ?Diagnosis Date  ? Anxiety   ? Arthritis   ? Breast cancer ([place])   ? Status post bilateral mastectomy  ? Breast reconstruction deformity   ? Complication of anesthesia   ? shaking-after mastectomies-resp and hr dropped  ? Depression, major   ? suicidal thoughts -implants, mental health, 05/11  ? Fibromyalgia   ? Hypothyroidism   ? Migraine headache   ? Narcolepsy   ? OSA on CPAP   ? Sleep apnea with use of continuous positive airway pressure (CPAP)   ? AHI 20 in HST, titration to 6 cm water - residual AHi 6 . remaining hypersomnic.   ? ?Past Surgical History:  ?Procedure Laterality Date  ? ARTHRODESIS METATARSALPHALANGEAL JOINT (MTPJ) Right 07/28/2017  ? Procedure: ARTHRODESIS  RIGHT HALLUX METATARSALPHALANGEAL JOINT (MTPJ);  Surgeon: Wylene Simmer, MD;  Location: [place];  Service: Orthopedics;  Laterality: Right;  ? Breast reconstructioin  7/09  ? flaps-bilat mastectomies  ? BTL    ? CARPAL TUNNEL RELEASE  2011  ? both hands  ? CARPOMETACARPEL SUSPENSION PLASTY Right 08/29/2014  ? Procedure: REMOVAL DERMASPAN SUSPENSIONPLASTY  RIGHT THUMB ABDUCTOR POLLIS TRANSFER;  Surgeon: Daryll Brod, MD;  Location: [place];  Service: Orthopedics;  Laterality: Right;  ? CATARACT EXTRACTION W/PHACO Right 12/17/2019  ? Procedure: CATARACT EXTRACTION PHACO AND INTRAOCULAR LENS PLACEMENT (IOC);  Surgeon: Baruch Goldmann, MD;  [facility];  Service: Ophthalmology;  Laterality: Right;  CDE: 6.54  ? CATARACT EXTRACTION W/PHACO Left 12/31/2019  ? Procedure: CATARACT EXTRACTION PHACO AND INTRAOCULAR LENS PLACEMENT (IOC);  Surgeon: Baruch Goldmann, MD;  [facility];  Service: Ophthalmology;  Laterality: Left;  CDE: 3.66  ? COLONOSCOPY W/ POLYPECTOMY  4/03  ? tubulovillous adenoma  ? FINGER ARTHROSCOPY WITH CARPOMETACARPEL [facility]) ARTHROPLASTY Right 06/20/2014  ? Procedure: ARTHROSCOPY RIGHT THUMB CARPOMETACARPEL GRAFT JACKET INTERPOSITION PARTIAL TRAPEZIECTOMY POSSIBLE OPEN;  Surgeon: Daryll Brod, MD;  Location: [place];  Service: Orthopedics;  Laterality: Right;  ? GANGLION CYST EXCISION    ? both [phone]  ? HAMMER TOE SURGERY Right 07/28/2017  ? Procedure: THIRD HAMMERTOE CORRECTION;  Surgeon: Wylene Simmer, MD;  Location: [place];  Service: Orthopedics;  Laterality: Right;  ? HARDWARE REMOVAL Right 07/28/2017  ? Procedure: REMOVAL OF DEEP IMPLANTS;  Surgeon: Wylene Simmer, MD;  Location: [place];  Service: Orthopedics;  Laterality: Right;  ? hysterestomy  2005  ?  MASTECTOMY Bilateral 2008  ? post- a -cath  2008  ? right knee arthroscopy    ? TOTAL SHOULDER ARTHROPLASTY Left 01/13/2017  ? Procedure: LEFT TOTAL SHOULDER ARTHROPLASTY;  Surgeon: Tania Ade, MD;  Location: [place];  Service: Orthopedics;  Laterality: Left;  LEFT TOTAL SHOULDER ARTHROPLASTY  ? TOTAL SHOULDER ARTHROPLASTY Right 02/11/2022  ? Procedure: TOTAL SHOULDER ARTHROPLASTY;  Surgeon: Nicholes Stairs, MD;  Location: [place];  Service: Orthopedics;  Laterality: Right;  150  ? TRIGGER FINGER RELEASE Right 08/29/2014  ?  Procedure: RELEASE TRIGGER FINGER/A-1 PULLEY;  Surgeon: Daryll Brod, MD;  Location: [place];  Service: Orthopedics;  Laterality: Right;  ? wisdom teeth out    ? ?Patient Active Problem List  ? Diagnosis Date Noted  ? History of total shoulder replacement, right 02/11/2022  ? Osteoarthritis of left shoulder 01/13/2017  ? S/P shoulder replacement, left 01/13/2017  ? OSA on CPAP 04/01/2015  ? Subacute confusional state 04/01/2015  ? Hallucinations, visual 04/01/2015  ? Hypnapompic hallucinations 04/01/2015  ? Sleep apnea with use of continuous positive airway pressure (CPAP)   ? Hypersomnia, persistent 03/30/2013  ? Obstructive sleep apnea (adult) (pediatric) 03/30/2013  ? CONSTIPATION 11/18/2008  ? RECTAL BLEEDING 11/18/2008  ? HEMORRHOIDS, INTERNAL 11/15/2008  ? DIVERTICULOSIS, COLON 11/15/2008  ? CELLULITIS/ABSCESS, TRUNK 02/22/2008  ? LOW BACK PAIN 02/22/2008  ? DIZZINESS 02/22/2008  ? FATIGUE 02/22/2008  ? PALPITATIONS 02/22/2008  ? MURMUR, CARDIAC, UNDIAGNOSED 02/22/2008  ? BREAST CANCER, HX OF 02/22/2008  ? COUGH 01/05/2008  ? DEPRESSION 07/05/2007  ? UTERINE POLYP 07/05/2007  ? FIBROMYALGIA 07/05/2007  ? COLONIC POLYPS, HX OF 07/05/2007  ? HX, PERSONAL, CERVICAL DYSPLASIA 07/05/2007  ? ?ONSET DATE: 02/11/22 ?  ?REFERRING DIAG: S/P right total shoulder arthroplasty ?  ?THERAPY DIAG:  ?Other symptoms and signs involving the musculoskeletal system ?  ?Acute pain of right shoulder ?  ?Stiffness of right shoulder, not elsewhere classified ?  ?PERTINENT HISTORY: Patient with OA of the right shoulder underwent a right total shoulder arthroplasty on 02/11/22 completed by Dr. Stann Mainland. At follow up appointment on 02/24/22, Dr. Stann Mainland encouraged patient to wear her sling another 3 weeks for a total of 5 weeks (03/17/22). Patient arrived to clinic without sling.  Follow up for Dr. Stann Mainland 04/01/22 ?  ?PRECAUTIONS: Shoulder At 6 weeks: AA/ROM, gently increasing P/ROM, A/ROM when ROM has improved and is close to  full. At 12 weeks: begin strengthening ?  ?WEIGHT BEARING RESTRICTIONS Yes NWB RUE ?  ? ?SUBJECTIVE: My L knee is more painful than the shoulder.  ? ?PAIN:  ?Are you having pain? Yes: NPRS scale: 5/10 ?Pain location: R shoulder  ?Pain description: sore; stabbing  ?Aggravating factors: movement  ?Relieving factors: pain meds; heating pad  ? ? ?OBJECTIVE ? ?HAND DOMINANCE: Right ?  ?ADLs: ?Overall ADLs: Unable to use RUE for any daily tasks. ?  ?FUNCTIONAL OUTCOME MEASURES: ?FOTO: complete next session ?  ?UE ROM    ?  ?Passive ROM Right ?03/08/2022  ?Shoulder flexion 126  ?Shoulder abduction 136  ?Shoulder internal rotation 90  ?Shoulder external rotation 40  ?(Blank rows = not tested) ?  ?  ?UE MMT:    ?  ?MMT- Assessed seated during functional task. Right ?03/08/2022  ?Shoulder flexion 3-/5  ?Shoulder abduction 3-/5  ?Shoulder internal rotation 3/5  ?Shoulder external rotation 3-5  ?(Blank rows = not tested) ?  ?  ?  ?COGNITION: ?Overall cognitive status: Within functional limits for tasks assessed ? ? ?  FOTO: 38.17  ? ? ?TODAY'S TREATMENT: ?-Manual therapy completed separately from all other interventions  ?Myofascial release and manual stretching completed to the right upper arm, upper trapezius, and scapularis region to decrease fascial restrictions and increase joint mobility in a pain free zone.  ?- P/ROM x10 supine: flexion, abduction, horizontal abduction, er, IR, protraction.  ?-AA/ROM supine: flexion x5, protraction x4.  ? ? ?  ?PATIENT EDUCATION: ?Education details:  ?Person educated:  ?IT sales professional:  ?Education comprehension:  ?  ?  ?HOME EXERCISE PROGRAM: ?Eval: table slides ? ?  ?GOALS: ?  ?SHORT TERM GOALS: Target date: 04/05/2022 ?  ?Pt will be educated and independent with HEP in order to increase her ability to use her right UE as her dominant extremity for 50% or more of her daily tasks.  ?Baseline: ?Goal status: ONGOING ?  ?2.  Pt will increase her RUE P/ROM to [facility] in order to increase ability to  complete upper body dressing tasks with less difficulty.  ?Baseline:  ?Goal status: ONGOING ?  ?3.  Pt will increase her RUE strength to 3+/5 in order to complete activities at or below her shoulder level with

## 2022-03-17 ENCOUNTER — Encounter (HOSPITAL_COMMUNITY): Payer: No Typology Code available for payment source | Admitting: Occupational Therapy

## 2022-03-18 ENCOUNTER — Ambulatory Visit (HOSPITAL_COMMUNITY): Payer: No Typology Code available for payment source

## 2022-03-18 ENCOUNTER — Encounter (HOSPITAL_COMMUNITY): Payer: Self-pay

## 2022-03-18 DIAGNOSIS — R29898 Other symptoms and signs involving the musculoskeletal system: Secondary | ICD-10-CM | POA: Diagnosis not present

## 2022-03-18 DIAGNOSIS — M25611 Stiffness of right shoulder, not elsewhere classified: Secondary | ICD-10-CM

## 2022-03-18 DIAGNOSIS — M25511 Pain in right shoulder: Secondary | ICD-10-CM

## 2022-03-18 NOTE — Therapy (Signed)
?OUTPATIENT OCCUPATIONAL THERAPY TREATMENT NOTE ? ? ?Patient Name: [name] ?MRN: [id number] ?DOB:[birth year], 61 y.o., female ?Today's Date: 03/18/2022 ? ?PCP: Sable Feil MD ?REFERRING PROVIDER: Nicholes Stairs, MD ?  ?END OF SESSION:  ? OT End of Session - 03/18/22 1127   ? ? Visit Number 3   ? Number of Visits 16   ? Date for OT Re-Evaluation 05/03/22   ? Authorization Type [facility] Preferred   ? Authorization Time Period No copay. 90 combined visits (3 visits used). no auth needed   ? Authorization - Visit Number 6   ? Authorization - Number of Visits 90   ? Progress Note Due on Visit 10   ? OT Start Time 1123   pt checked in late  ? OT Stop Time 1158   ? OT Time Calculation (min) 35 min   ? Activity Tolerance Patient tolerated treatment well   ? Behavior During Therapy [facility] for tasks assessed/performed   ? ?  ?  ? ?  ? ? ? ?Past Medical History:  ?Diagnosis Date  ? Anxiety   ? Arthritis   ? Breast cancer ([place])   ? Status post bilateral mastectomy  ? Breast reconstruction deformity   ? Complication of anesthesia   ? shaking-after mastectomies-resp and hr dropped  ? Depression, major   ? suicidal thoughts -implants, mental health, 05/11  ? Fibromyalgia   ? Hypothyroidism   ? Migraine headache   ? Narcolepsy   ? OSA on CPAP   ? Sleep apnea with use of continuous positive airway pressure (CPAP)   ? AHI 20 in HST, titration to 6 cm water - residual AHi 6 . remaining hypersomnic.   ? ?Past Surgical History:  ?Procedure Laterality Date  ? ARTHRODESIS METATARSALPHALANGEAL JOINT (MTPJ) Right 07/28/2017  ? Procedure: ARTHRODESIS  RIGHT HALLUX METATARSALPHALANGEAL JOINT (MTPJ);  Surgeon: Wylene Simmer, MD;  Location: [place];  Service: Orthopedics;  Laterality: Right;  ? Breast reconstructioin  7/09  ? flaps-bilat mastectomies  ? BTL    ? CARPAL TUNNEL RELEASE  2011  ? both hands  ? CARPOMETACARPEL SUSPENSION PLASTY Right 08/29/2014  ? Procedure: REMOVAL DERMASPAN SUSPENSIONPLASTY  RIGHT THUMB ABDUCTOR POLLIS TRANSFER;  Surgeon: Daryll Brod, MD;  Location: [place];  Service: Orthopedics;  Laterality: Right;  ? CATARACT EXTRACTION W/PHACO Right 12/17/2019  ? Procedure: CATARACT EXTRACTION PHACO AND INTRAOCULAR LENS PLACEMENT (IOC);  Surgeon: Baruch Goldmann, MD;  [facility];  Service: Ophthalmology;  Laterality: Right;  CDE: 6.54  ? CATARACT EXTRACTION W/PHACO Left 12/31/2019  ? Procedure: CATARACT EXTRACTION PHACO AND INTRAOCULAR LENS PLACEMENT (IOC);  Surgeon: Baruch Goldmann, MD;  Location: [facility];  Service: Ophthalmology;  Laterality: Left;  CDE: 3.66  ? COLONOSCOPY W/ POLYPECTOMY  4/03  ? tubulovillous adenoma  ? FINGER ARTHROSCOPY WITH CARPOMETACARPEL [facility]) ARTHROPLASTY Right 06/20/2014  ? Procedure: ARTHROSCOPY RIGHT THUMB CARPOMETACARPEL GRAFT JACKET INTERPOSITION PARTIAL TRAPEZIECTOMY POSSIBLE OPEN;  Surgeon: Daryll Brod, MD;  Location: [place];  Service: Orthopedics;  Laterality: Right;  ? GANGLION CYST EXCISION    ? both [phone]  ? HAMMER TOE SURGERY Right 07/28/2017  ? Procedure: THIRD HAMMERTOE CORRECTION;  Surgeon: Wylene Simmer, MD;  Location: [place];  Service: Orthopedics;  Laterality: Right;  ? HARDWARE REMOVAL Right 07/28/2017  ? Procedure: REMOVAL OF DEEP IMPLANTS;  Surgeon: Wylene Simmer, MD;  Location: [place];  Service: Orthopedics;  Laterality: Right;  ? hysterestomy  2005  ?  MASTECTOMY Bilateral 2008  ? post- a -cath  2008  ? right knee arthroscopy    ? TOTAL SHOULDER ARTHROPLASTY Left 01/13/2017  ? Procedure: LEFT TOTAL SHOULDER ARTHROPLASTY;  Surgeon: Tania Ade, MD;  Location: [place];  Service: Orthopedics;  Laterality: Left;  LEFT TOTAL SHOULDER ARTHROPLASTY  ? TOTAL SHOULDER ARTHROPLASTY Right 02/11/2022  ? Procedure: TOTAL SHOULDER ARTHROPLASTY;  Surgeon: Nicholes Stairs, MD;  Location: [place];  Service: Orthopedics;  Laterality: Right;  150  ? TRIGGER FINGER RELEASE Right 08/29/2014  ?  Procedure: RELEASE TRIGGER FINGER/A-1 PULLEY;  Surgeon: Daryll Brod, MD;  Location: [place];  Service: Orthopedics;  Laterality: Right;  ? wisdom teeth out    ? ?Patient Active Problem List  ? Diagnosis Date Noted  ? History of total shoulder replacement, right 02/11/2022  ? Osteoarthritis of left shoulder 01/13/2017  ? S/P shoulder replacement, left 01/13/2017  ? OSA on CPAP 04/01/2015  ? Subacute confusional state 04/01/2015  ? Hallucinations, visual 04/01/2015  ? Hypnapompic hallucinations 04/01/2015  ? Sleep apnea with use of continuous positive airway pressure (CPAP)   ? Hypersomnia, persistent 03/30/2013  ? Obstructive sleep apnea (adult) (pediatric) 03/30/2013  ? CONSTIPATION 11/18/2008  ? RECTAL BLEEDING 11/18/2008  ? HEMORRHOIDS, INTERNAL 11/15/2008  ? DIVERTICULOSIS, COLON 11/15/2008  ? CELLULITIS/ABSCESS, TRUNK 02/22/2008  ? LOW BACK PAIN 02/22/2008  ? DIZZINESS 02/22/2008  ? FATIGUE 02/22/2008  ? PALPITATIONS 02/22/2008  ? MURMUR, CARDIAC, UNDIAGNOSED 02/22/2008  ? BREAST CANCER, HX OF 02/22/2008  ? COUGH 01/05/2008  ? DEPRESSION 07/05/2007  ? UTERINE POLYP 07/05/2007  ? FIBROMYALGIA 07/05/2007  ? COLONIC POLYPS, HX OF 07/05/2007  ? HX, PERSONAL, CERVICAL DYSPLASIA 07/05/2007  ? ?ONSET DATE: 02/11/22 ?  ?REFERRING DIAG: S/P right total shoulder arthroplasty ?  ?THERAPY DIAG:  ?Other symptoms and signs involving the musculoskeletal system ?  ?Acute pain of right shoulder ?  ?Stiffness of right shoulder, not elsewhere classified ?  ?PERTINENT HISTORY: Patient with OA of the right shoulder underwent a right total shoulder arthroplasty on 02/11/22 completed by Dr. Stann Mainland. At follow up appointment on 02/24/22, Dr. Stann Mainland encouraged patient to wear her sling another 3 weeks for a total of 5 weeks (03/17/22). Patient arrived to clinic without sling.  Follow up for Dr. Stann Mainland 04/01/22 ?  ?PRECAUTIONS: Shoulder At 6 weeks( 03/25/22): AA/ROM, gently increasing P/ROM, A/ROM when ROM has improved and is  close to full. At 12 weeks: begin strengthening ?  ?WEIGHT BEARING RESTRICTIONS Yes NWB RUE ?  ? ?SUBJECTIVE: S: My left knee is more painful than the shoulder.  ? ?PAIN:  ?Are you having pain? Yes: NPRS scale: 4/10 ?Pain location: Rt shoulder  ?Pain description: sore; intermittent  ?Aggravating factors: movement  ?Relieving factors: pain meds; heating pad  ? ? ?OBJECTIVE ? ?HAND DOMINANCE: Right ?  ?ADLs: ?Overall ADLs: Unable to use RUE for any daily tasks. ?  ?FUNCTIONAL OUTCOME MEASURES: ?38/100 ?  ?UE ROM    ?  ?Passive ROM Right ?03/08/2022  ?Shoulder flexion 126  ?Shoulder abduction 136  ?Shoulder internal rotation 90  ?Shoulder external rotation 40  ?(Blank rows = not tested) ?  ?  ?UE MMT:    ?  ?MMT- Assessed seated during functional task. Right ?03/08/2022  ?Shoulder flexion 3-/5  ?Shoulder abduction 3-/5  ?Shoulder internal rotation 3/5  ?Shoulder external rotation 3-5  ?(Blank rows = not tested) ?  ?  ?  ? ? ? ?TODAY'S TREATMENT: ?03/18/22 ?- Manual Therapy: completed prior to exercises.  Myofascial release completed to right upper arm, upper trapezius, and scapularis region.  ?- P/ROM: supine, all shoulder ranges, 10X ?- Isometrics: supine, shoulder, IR/er, flexion, extension, abduction, adduction, 3x5" ?- A/ROM: seated, scapula, row, extension, 10X ?- Therapy ball: flexion, abduction, 10X with 2" hold at end stretch ? ? ?-Manual therapy completed separately from all other interventions  ?Myofascial release and manual stretching completed to the right upper arm, upper trapezius, and scapularis region to decrease fascial restrictions and increase joint mobility in a pain free zone.  ?- P/ROM x10 supine: flexion, abduction, horizontal abduction, er, IR, protraction.  ?-AA/ROM supine: flexion x5, protraction x4.  ? ? ?  ?PATIENT EDUCATION: ?Education details:  ?Person educated:  ?IT sales professional:  ?Education comprehension:  ?  ?  ?HOME EXERCISE PROGRAM: ?Eval: table slides ? ?  ?GOALS: ?  ?SHORT TERM GOALS:  Target date: 04/05/2022 ?  ?Pt will be educated and independent with HEP in order to increase her ability to use her right UE as her dominant extremity for 50% or more of her daily tasks.  ?Baseline: ?Goal status:

## 2022-03-22 ENCOUNTER — Encounter (HOSPITAL_COMMUNITY): Payer: No Typology Code available for payment source

## 2022-03-23 ENCOUNTER — Ambulatory Visit (HOSPITAL_COMMUNITY): Payer: No Typology Code available for payment source | Admitting: Occupational Therapy

## 2022-03-23 ENCOUNTER — Encounter (HOSPITAL_COMMUNITY): Payer: Self-pay | Admitting: Occupational Therapy

## 2022-03-23 DIAGNOSIS — M25511 Pain in right shoulder: Secondary | ICD-10-CM

## 2022-03-23 DIAGNOSIS — R29898 Other symptoms and signs involving the musculoskeletal system: Secondary | ICD-10-CM

## 2022-03-23 DIAGNOSIS — M25611 Stiffness of right shoulder, not elsewhere classified: Secondary | ICD-10-CM

## 2022-03-23 NOTE — Therapy (Signed)
?OUTPATIENT OCCUPATIONAL THERAPY TREATMENT NOTE ? ? ?Patient Name: [name] ?MRN: [id number] ?DOB:[birth year], 61 y.o., female ?Today's Date: 03/23/2022 ? ?PCP: Sable Feil MD ?REFERRING PROVIDER: Nicholes Stairs, MD ?  ?END OF SESSION:  ? OT End of Session - 03/23/22 1144   ? ? Visit Number 4   ? Number of Visits 16   ? Date for OT Re-Evaluation 05/03/22   ? Authorization Type [facility] Preferred   ? Authorization Time Period No copay. 90 combined visits (3 visits used). no auth needed   ? Authorization - Visit Number 7   ? Authorization - Number of Visits 90   ? Progress Note Due on Visit 10   ? OT Start Time 1118   ? OT Stop Time 1156   ? OT Time Calculation (min) 38 min   ? Activity Tolerance Patient tolerated treatment well   ? Behavior During Therapy [facility] for tasks assessed/performed   ? ?  ?  ? ?  ? ? ? ? ?Past Medical History:  ?Diagnosis Date  ? Anxiety   ? Arthritis   ? Breast cancer ([place])   ? Status post bilateral mastectomy  ? Breast reconstruction deformity   ? Complication of anesthesia   ? shaking-after mastectomies-resp and hr dropped  ? Depression, major   ? suicidal thoughts -implants, mental health, 05/11  ? Fibromyalgia   ? Hypothyroidism   ? Migraine headache   ? Narcolepsy   ? OSA on CPAP   ? Sleep apnea with use of continuous positive airway pressure (CPAP)   ? AHI 20 in HST, titration to 6 cm water - residual AHi 6 . remaining hypersomnic.   ? ?Past Surgical History:  ?Procedure Laterality Date  ? ARTHRODESIS METATARSALPHALANGEAL JOINT (MTPJ) Right 07/28/2017  ? Procedure: ARTHRODESIS  RIGHT HALLUX METATARSALPHALANGEAL JOINT (MTPJ);  Surgeon: Wylene Simmer, MD;  Location: [place];  Service: Orthopedics;  Laterality: Right;  ? Breast reconstructioin  7/09  ? flaps-bilat mastectomies  ? BTL    ? CARPAL TUNNEL RELEASE  2011  ? both hands  ? CARPOMETACARPEL SUSPENSION PLASTY Right 08/29/2014  ? Procedure: REMOVAL DERMASPAN SUSPENSIONPLASTY RIGHT THUMB ABDUCTOR  POLLIS TRANSFER;  Surgeon: Daryll Brod, MD;  Location: [place];  Service: Orthopedics;  Laterality: Right;  ? CATARACT EXTRACTION W/PHACO Right 12/17/2019  ? Procedure: CATARACT EXTRACTION PHACO AND INTRAOCULAR LENS PLACEMENT (IOC);  Surgeon: Baruch Goldmann, MD;  [facility]: [facility];  Service: Ophthalmology;  Laterality: Right;  CDE: 6.54  ? CATARACT EXTRACTION W/PHACO Left 12/31/2019  ? Procedure: CATARACT EXTRACTION PHACO AND INTRAOCULAR LENS PLACEMENT (IOC);  Surgeon: Baruch Goldmann, MD;  [facility]: [facility];  Service: Ophthalmology;  Laterality: Left;  CDE: 3.66  ? COLONOSCOPY W/ POLYPECTOMY  4/03  ? tubulovillous adenoma  ? FINGER ARTHROSCOPY WITH CARPOMETACARPEL [facility]) ARTHROPLASTY Right 06/20/2014  ? Procedure: ARTHROSCOPY RIGHT THUMB CARPOMETACARPEL GRAFT JACKET INTERPOSITION PARTIAL TRAPEZIECTOMY POSSIBLE OPEN;  Surgeon: Daryll Brod, MD;  Location: [place];  Service: Orthopedics;  Laterality: Right;  ? GANGLION CYST EXCISION    ? both [phone]  ? HAMMER TOE SURGERY Right 07/28/2017  ? Procedure: THIRD HAMMERTOE CORRECTION;  Surgeon: Wylene Simmer, MD;  Location: [place];  Service: Orthopedics;  Laterality: Right;  ? HARDWARE REMOVAL Right 07/28/2017  ? Procedure: REMOVAL OF DEEP IMPLANTS;  Surgeon: Wylene Simmer, MD;  Location: [place];  Service: Orthopedics;  Laterality: Right;  ? hysterestomy  2005  ? MASTECTOMY Bilateral 2008  ?  post- a -cath  2008  ? right knee arthroscopy    ? TOTAL SHOULDER ARTHROPLASTY Left 01/13/2017  ? Procedure: LEFT TOTAL SHOULDER ARTHROPLASTY;  Surgeon: Tania Ade, MD;  Location: [place];  Service: Orthopedics;  Laterality: Left;  LEFT TOTAL SHOULDER ARTHROPLASTY  ? TOTAL SHOULDER ARTHROPLASTY Right 02/11/2022  ? Procedure: TOTAL SHOULDER ARTHROPLASTY;  Surgeon: Nicholes Stairs, MD;  Location: [place];  Service: Orthopedics;  Laterality: Right;  150  ? TRIGGER FINGER RELEASE Right 08/29/2014  ? Procedure: RELEASE TRIGGER  FINGER/A-1 PULLEY;  Surgeon: Daryll Brod, MD;  Location: [place];  Service: Orthopedics;  Laterality: Right;  ? wisdom teeth out    ? ?Patient Active Problem List  ? Diagnosis Date Noted  ? History of total shoulder replacement, right 02/11/2022  ? Osteoarthritis of left shoulder 01/13/2017  ? S/P shoulder replacement, left 01/13/2017  ? OSA on CPAP 04/01/2015  ? Subacute confusional state 04/01/2015  ? Hallucinations, visual 04/01/2015  ? Hypnapompic hallucinations 04/01/2015  ? Sleep apnea with use of continuous positive airway pressure (CPAP)   ? Hypersomnia, persistent 03/30/2013  ? Obstructive sleep apnea (adult) (pediatric) 03/30/2013  ? CONSTIPATION 11/18/2008  ? RECTAL BLEEDING 11/18/2008  ? HEMORRHOIDS, INTERNAL 11/15/2008  ? DIVERTICULOSIS, COLON 11/15/2008  ? CELLULITIS/ABSCESS, TRUNK 02/22/2008  ? LOW BACK PAIN 02/22/2008  ? DIZZINESS 02/22/2008  ? FATIGUE 02/22/2008  ? PALPITATIONS 02/22/2008  ? MURMUR, CARDIAC, UNDIAGNOSED 02/22/2008  ? BREAST CANCER, HX OF 02/22/2008  ? COUGH 01/05/2008  ? DEPRESSION 07/05/2007  ? UTERINE POLYP 07/05/2007  ? FIBROMYALGIA 07/05/2007  ? COLONIC POLYPS, HX OF 07/05/2007  ? HX, PERSONAL, CERVICAL DYSPLASIA 07/05/2007  ? ?ONSET DATE: 02/11/22 ?  ?REFERRING DIAG: S/P right total shoulder arthroplasty ?  ?THERAPY DIAG:  ?Other symptoms and signs involving the musculoskeletal system ?  ?Acute pain of right shoulder ?  ?Stiffness of right shoulder, not elsewhere classified ?  ?PERTINENT HISTORY: Patient with OA of the right shoulder underwent a right total shoulder arthroplasty on 02/11/22 completed by Dr. Stann Mainland. At follow up appointment on 02/24/22, Dr. Stann Mainland encouraged patient to wear her sling another 3 weeks for a total of 5 weeks (03/17/22). Patient arrived to clinic without sling.  Follow up for Dr. Stann Mainland 04/01/22 ?  ?PRECAUTIONS: Shoulder At 6 weeks( 03/25/22): AA/ROM, gently increasing P/ROM, A/ROM when ROM has improved and is close to full. At 12 weeks:  begin strengthening ?  ?WEIGHT BEARING RESTRICTIONS Yes NWB RUE ?  ? ?SUBJECTIVE: S: I can move my arm in different directions.  ? ?PAIN:  ?Are you having pain? Yes: NPRS scale: 4/10 ?Pain location: Rt shoulder  ?Pain description: aching; intermittent  ?Aggravating factors: movement  ?Relieving factors: pain meds; heating pad  ? ? ?OBJECTIVE ? ?HAND DOMINANCE: Right ?  ?ADLs: ?Overall ADLs: Unable to use RUE for any daily tasks. ?  ?FUNCTIONAL OUTCOME MEASURES: ?38/100 ?  ?UE ROM    ?  ?Passive ROM Right ?03/08/2022  ?Shoulder flexion 126  ?Shoulder abduction 136  ?Shoulder internal rotation 90  ?Shoulder external rotation 40  ?(Blank rows = not tested) ?  ?  ?UE MMT:    ?  ?MMT- Assessed seated during functional task. Right ?03/08/2022  ?Shoulder flexion 3-/5  ?Shoulder abduction 3-/5  ?Shoulder internal rotation 3/5  ?Shoulder external rotation 3-5  ?(Blank rows = not tested) ?  ?  ?  ? ? ? ?TODAY'S TREATMENT: ?03/23/22 ?- Manual Therapy: completed prior to exercises. Myofascial release completed to right upper  arm, upper trapezius, and scapularis region.  ?- P/ROM: supine, all shoulder ranges, 10X ?- Isometrics: supine, shoulder, IR/er, flexion, extension, abduction, adduction, 3x5" ?- A/ROM: seated, scapula, row, extension, 10X ?-Low thumb tacks, 1' ?-prot/ret/eleb/dep, 1', low level ?- Therapy ball: flexion, abduction, 10X with 2" hold at end stretch ? ? ?03/18/22 ?- Manual Therapy: completed prior to exercises. Myofascial release completed to right upper arm, upper trapezius, and scapularis region.  ?- P/ROM: supine, all shoulder ranges, 10X ?- Isometrics: supine, shoulder, IR/er, flexion, extension, abduction, adduction, 3x5" ?- A/ROM: seated, scapula, row, extension, 10X ?- Therapy ball: flexion, abduction, 10X with 2" hold at end stretch ? ? ?-Manual therapy completed separately from all other interventions  ?Myofascial release and manual stretching completed to the right upper arm, upper trapezius, and  scapularis region to decrease fascial restrictions and increase joint mobility in a pain free zone.  ?- P/ROM x10 supine: flexion, abduction, horizontal abduction, er, IR, protraction.  ?-AA/ROM supine: flexion

## 2022-03-26 ENCOUNTER — Telehealth (HOSPITAL_COMMUNITY): Payer: Self-pay | Admitting: Occupational Therapy

## 2022-03-26 ENCOUNTER — Ambulatory Visit (HOSPITAL_COMMUNITY): Payer: No Typology Code available for payment source | Admitting: Occupational Therapy

## 2022-03-26 NOTE — Telephone Encounter (Signed)
Pt reported she forgot to call and is missing due to having company form out of town. Reminded pt to try to call before to let office know of cancel.  Joshau Code OT, MOT

## 2022-04-01 ENCOUNTER — Ambulatory Visit (HOSPITAL_COMMUNITY): Payer: No Typology Code available for payment source

## 2022-04-01 ENCOUNTER — Encounter (HOSPITAL_COMMUNITY): Payer: Self-pay

## 2022-04-01 DIAGNOSIS — M25511 Pain in right shoulder: Secondary | ICD-10-CM

## 2022-04-01 DIAGNOSIS — M25611 Stiffness of right shoulder, not elsewhere classified: Secondary | ICD-10-CM

## 2022-04-01 DIAGNOSIS — R29898 Other symptoms and signs involving the musculoskeletal system: Secondary | ICD-10-CM | POA: Diagnosis not present

## 2022-04-01 NOTE — Therapy (Signed)
OUTPATIENT OCCUPATIONAL THERAPY TREATMENT NOTE   Patient Name: [name] MRN: [id number] DOB:August 20, 1961, 61 y.o., female Today's Date: 04/01/2022  PCP: Sable Feil MD REFERRING PROVIDER: Nicholes Stairs, MD   END OF SESSION:   OT End of Session - 04/01/22 1040     Visit Number 5    Number of Visits 16    Date for OT Re-Evaluation 05/03/22    Authorization Type Aetna [place] Preferred    Authorization Time Period No copay. 90 combined visits (3 visits used). no auth needed    Authorization - Visit Number 8    Authorization - Number of Visits 90    Progress Note Due on Visit 10    OT Start Time 4196   Pt arrived late and requested to leave at 11:00 due to MD appointment in [place].   OT Stop Time 1101    OT Time Calculation (min) 21 min    Activity Tolerance Patient tolerated treatment well    Behavior During Therapy WFL for tasks assessed/performed              Rationale for Evaluation and Treatment Rehabilitation   Past Medical History:  Diagnosis Date   Anxiety    Arthritis    Breast cancer ([place])    Status post bilateral mastectomy   Breast reconstruction deformity    Complication of anesthesia    shaking-after mastectomies-resp and hr dropped   Depression, major    suicidal thoughts -implants, mental health, 05/11   Fibromyalgia    Hypothyroidism    Migraine headache    Narcolepsy    OSA on CPAP    Sleep apnea with use of continuous positive airway pressure (CPAP)    AHI 20 in HST, titration to 6 cm water - residual AHi 6 . remaining hypersomnic.    Past Surgical History:  Procedure Laterality Date   ARTHRODESIS METATARSALPHALANGEAL JOINT (MTPJ) Right 07/28/2017   Procedure: ARTHRODESIS  RIGHT HALLUX METATARSALPHALANGEAL JOINT (MTPJ);  Surgeon: Wylene Simmer, MD;  Location: [place];  Service: Orthopedics;  Laterality: Right;   Breast reconstructioin  7/09   flaps-bilat mastectomies   BTL     CARPAL TUNNEL RELEASE   2011   both hands   CARPOMETACARPEL SUSPENSION PLASTY Right 08/29/2014   Procedure: REMOVAL DERMASPAN SUSPENSIONPLASTY RIGHT THUMB ABDUCTOR POLLIS TRANSFER;  Surgeon: Daryll Brod, MD;  Location: [place];  Service: Orthopedics;  Laterality: Right;   CATARACT EXTRACTION W/PHACO Right 12/17/2019   Procedure: CATARACT EXTRACTION PHACO AND INTRAOCULAR LENS PLACEMENT (IOC);  Surgeon: Baruch Goldmann, MD;  [facility];  Service: Ophthalmology;  Laterality: Right;  CDE: 6.54   CATARACT EXTRACTION W/PHACO Left 12/31/2019   Procedure: CATARACT EXTRACTION PHACO AND INTRAOCULAR LENS PLACEMENT (IOC);  Surgeon: Baruch Goldmann, MD;  Location: [facility];  Service: Ophthalmology;  Laterality: Left;  CDE: 3.66   COLONOSCOPY W/ POLYPECTOMY  4/03   tubulovillous adenoma   FINGER ARTHROSCOPY WITH CARPOMETACARPEL ([place]) ARTHROPLASTY Right 06/20/2014   Procedure: ARTHROSCOPY RIGHT THUMB CARPOMETACARPEL GRAFT JACKET INTERPOSITION PARTIAL TRAPEZIECTOMY POSSIBLE OPEN;  Surgeon: Daryll Brod, MD;  Location: [place];  Service: Orthopedics;  Laterality: Right;   GANGLION CYST EXCISION     both [phone]   HAMMER TOE SURGERY Right 07/28/2017   Procedure: THIRD HAMMERTOE CORRECTION;  Surgeon: Wylene Simmer, MD;  Location: [place];  Service: Orthopedics;  Laterality: Right;   HARDWARE REMOVAL Right 07/28/2017   Procedure: REMOVAL OF DEEP IMPLANTS;  Surgeon: Wylene Simmer,  MD;  Location: [place];  Service: Orthopedics;  Laterality: Right;   hysterestomy  2005   MASTECTOMY Bilateral 2008   post- a -cath  2008   right knee arthroscopy     TOTAL SHOULDER ARTHROPLASTY Left 01/13/2017   Procedure: LEFT TOTAL SHOULDER ARTHROPLASTY;  Surgeon: Tania Ade, MD;  Location: [place];  Service: Orthopedics;  Laterality: Left;  LEFT TOTAL SHOULDER ARTHROPLASTY   TOTAL SHOULDER ARTHROPLASTY Right 02/11/2022   Procedure: TOTAL SHOULDER ARTHROPLASTY;  Surgeon: Nicholes Stairs,  MD;  Location: [place];  Service: Orthopedics;  Laterality: Right;  150   TRIGGER FINGER RELEASE Right 08/29/2014   Procedure: RELEASE TRIGGER FINGER/A-1 PULLEY;  Surgeon: Daryll Brod, MD;  Location: [place];  Service: Orthopedics;  Laterality: Right;   wisdom teeth out     Patient Active Problem List   Diagnosis Date Noted   History of total shoulder replacement, right 02/11/2022   Osteoarthritis of left shoulder 01/13/2017   S/P shoulder replacement, left 01/13/2017   OSA on CPAP 04/01/2015   Subacute confusional state 04/01/2015   Hallucinations, visual 04/01/2015   Hypnapompic hallucinations 04/01/2015   Sleep apnea with use of continuous positive airway pressure (CPAP)    Hypersomnia, persistent 03/30/2013   Obstructive sleep apnea (adult) (pediatric) 03/30/2013   CONSTIPATION 11/18/2008   RECTAL BLEEDING 11/18/2008   HEMORRHOIDS, INTERNAL 11/15/2008   DIVERTICULOSIS, COLON 11/15/2008   CELLULITIS/ABSCESS, TRUNK 02/22/2008   LOW BACK PAIN 02/22/2008   DIZZINESS 02/22/2008   FATIGUE 02/22/2008   PALPITATIONS 02/22/2008   MURMUR, CARDIAC, UNDIAGNOSED 02/22/2008   BREAST CANCER, HX OF 02/22/2008   COUGH 01/05/2008   DEPRESSION 07/05/2007   UTERINE POLYP 07/05/2007   FIBROMYALGIA 07/05/2007   COLONIC POLYPS, HX OF 07/05/2007   HX, PERSONAL, CERVICAL DYSPLASIA 07/05/2007   ONSET DATE: 02/11/22   REFERRING DIAG: S/P right total shoulder arthroplasty   THERAPY DIAG:  Other symptoms and signs involving the musculoskeletal system   Acute pain of right shoulder   Stiffness of right shoulder, not elsewhere classified   PERTINENT HISTORY: Patient with OA of the right shoulder underwent a right total shoulder arthroplasty on 02/11/22 completed by Dr. Stann Mainland. At follow up appointment on 02/24/22, Dr. Stann Mainland encouraged patient to wear her sling another 3 weeks for a total of 5 weeks (03/17/22). Patient arrived to clinic without sling.  Follow up for Dr. Stann Mainland 04/01/22    PRECAUTIONS: Shoulder At 6 weeks( 03/25/22): AA/ROM, gently increasing P/ROM, A/ROM when ROM has improved and is close to full. At 12 weeks: begin strengthening   WEIGHT BEARING RESTRICTIONS Yes NWB RUE    SUBJECTIVE: S: I fell last week and I think I messed up my left knee.  PAIN:  Are you having pain? Yes: NPRS scale: 5/10 Pain location: Rt shoulder  Pain description: aching; intermittent  Aggravating factors: movement  Relieving factors: pain meds; heating pad    OBJECTIVE  HAND DOMINANCE: Right   ADLs: Overall ADLs: Unable to use RUE for any daily tasks.   FUNCTIONAL OUTCOME MEASURES: 38/100   UE ROM      Passive ROM -supine IR/er Right 03/08/2022 Right  04/01/22  Shoulder flexion 126 135  Shoulder abduction 136 180  Shoulder internal rotation 90 90  Shoulder external rotation 40 60  (Blank rows = not tested)  A/ROM not measured prior to this date. Active ROM- Supine IR/er Right 04/01/2022  Shoulder flexion 132  Shoulder abduction 96  Shoulder internal rotation 90  Shoulder external  rotation 70  (Blank rows = not tested)    UE MMT:      MMT- Assessed seated during functional task. Right 03/08/2022 Right 04/01/22  Shoulder flexion 3-/5 3-/5  Shoulder abduction 3-/5 3-/5  Shoulder internal rotation 3/5 3/5  Shoulder external rotation 3-5 3/5  (Blank rows = not tested)          TODAY'S TREATMENT: 04/01/22 - P/ROM: supine, all shoulder ranges, 10X -AA/ROM: supine, shoulder, protraction, IR/er, flexion, 10X  03/23/22 - Manual Therapy: completed prior to exercises. Myofascial release completed to right upper arm, upper trapezius, and scapularis region.  - P/ROM: supine, all shoulder ranges, 10X - Isometrics: supine, shoulder, IR/er, flexion, extension, abduction, adduction, 3x5" - A/ROM: seated, scapula, row, extension, 10X -Low thumb tacks, 1' -prot/ret/eleb/dep, 1', low level - Therapy ball: flexion, abduction, 10X with 2" hold at end stretch        PATIENT EDUCATION: Education details:  Person educated:  IT sales professional:  Education comprehension:      HOME EXERCISE PROGRAM: Eval: table slides    GOALS:   SHORT TERM GOALS: Target date: 04/05/2022   Pt will be educated and independent with HEP in order to increase her ability to use her right UE as her dominant extremity for 50% or more of her daily tasks.  Baseline: Goal status: ONGOING   2.  Pt will increase her RUE P/ROM to [facility] in order to increase ability to complete upper body dressing tasks with less difficulty.  Baseline:  Goal status: MET   3.  Pt will increase her RUE strength to 3+/5 in order to complete activities at or below her shoulder level with increased shoulder and scapular stability demonstrated.  Baseline:  Goal status: ONGOING   4.  Pt will decrease her RUE fascial restrictions to moderate amount or less in order to increase the functional mobility needed to complete functional reaching tasks at home with less difficulty.  Baseline:  Goal status: ONGOING   5.  Pt will report a decrease in pain level of approximately 5/10 when using her RUE for simple light ADL tasks. Baseline:  Goal status: MET     LONG TERM GOALS: Target date: 05/03/2022   Pt will return to using her RUE as her dominant extremity for all daily tasks.  Baseline:  Goal status: ONGOING   2.  Pt will increase her RUE A/ROM to [facility] in order to reach above her shoulder level to retrieve items out of a cabinet. Baseline:  Goal status: ONGOING   3.  Pt will will increase her RUE strength to 4+/5 in order to manage weight of moderate weight using both arms. Baseline:  Goal status: ONGOING   4.  Pt will report a decrease in pain level in her right shoulder of approximately 3/10 or less when using it to complete daily tasks.  Baseline:  Goal status: ONGOING   5.  Pt will decrease her RUE fascial restrictions to minimal amount or less in order to increase the functional mobility  needed to complete higher level reaching tasks.  Baseline:  Goal status: ONGOING     ASSESSMENT:   CLINICAL IMPRESSION: A:  Due to time constraint, measurements were taken for follow up appointment with Dr. Stann Mainland. Completed P/ROM prior. Patient continues to have increased pain with passive ROM which is monitored during session. Progressed to some AA/ROM exercises during session with VC for form and technique.   PERFORMANCE DEFICITS in functional skills including ADLs, IADLs, edema, ROM, strength, pain,  fascial restrictions, mobility, decreased knowledge of precautions, and UE functional use. IMPAIRMENTS are limiting patient from ADLs, IADLs, and leisure.      PLAN: OT FREQUENCY: 2x/week   OT DURATION: 8 weeks   PLANNED INTERVENTIONS: self care/ADL training, therapeutic exercise, therapeutic activity, neuromuscular re-education, manual therapy, passive range of motion, electrical stimulation, ultrasound, moist heat, cryotherapy, patient/family education, coping strategies training, and DME and/or AE instructions       CONSULTED AND AGREED WITH PLAN OF CARE: Patient   PLAN FOR NEXT SESSION: P: 10th visit progress note Measurements were taken last session. Complete  FOTO, review goals, Continue to progress AA/ROM per protocol. Update HEP if ready. Follow up on MD appointment.    Ailene Ravel, OTR/L,CBIS  Supplemental OT - [facility] and [facility]  04/01/2022, 11:06 AM

## 2022-04-02 ENCOUNTER — Ambulatory Visit (HOSPITAL_COMMUNITY): Payer: No Typology Code available for payment source | Admitting: Occupational Therapy

## 2022-04-06 ENCOUNTER — Ambulatory Visit (HOSPITAL_COMMUNITY): Payer: No Typology Code available for payment source | Attending: Internal Medicine | Admitting: Occupational Therapy

## 2022-04-06 ENCOUNTER — Encounter (HOSPITAL_COMMUNITY): Payer: Self-pay | Admitting: Occupational Therapy

## 2022-04-06 DIAGNOSIS — Z96611 Presence of right artificial shoulder joint: Secondary | ICD-10-CM | POA: Insufficient documentation

## 2022-04-06 DIAGNOSIS — Z4789 Encounter for other orthopedic aftercare: Secondary | ICD-10-CM | POA: Insufficient documentation

## 2022-04-06 DIAGNOSIS — M25511 Pain in right shoulder: Secondary | ICD-10-CM | POA: Diagnosis not present

## 2022-04-06 DIAGNOSIS — M25611 Stiffness of right shoulder, not elsewhere classified: Secondary | ICD-10-CM

## 2022-04-06 DIAGNOSIS — R29898 Other symptoms and signs involving the musculoskeletal system: Secondary | ICD-10-CM

## 2022-04-06 NOTE — Therapy (Signed)
OUTPATIENT OCCUPATIONAL THERAPY TREATMENT NOTE   Patient Name: [name] MRN: [id number] DOB:June 28, 1961, 61 y.o., female Today's Date: 04/06/2022  PCP: Sable Feil MD REFERRING PROVIDER: Nicholes Stairs, MD   END OF SESSION:   OT End of Session - 04/06/22 1533     Visit Number 6    Number of Visits 16    Date for OT Re-Evaluation 05/03/22    Authorization Type Aetna [place] Preferred    Authorization Time Period No copay. 90 combined visits (3 visits used). no auth needed    Authorization - Visit Number 9    Authorization - Number of Visits 90    Progress Note Due on Visit 7    OT Start Time 1352    OT Stop Time 1431    OT Time Calculation (min) 39 min    Activity Tolerance Patient tolerated treatment well    Behavior During Therapy [facility] for tasks assessed/performed              Progress Note Reporting Period 03/08/2022 to 04/06/2022  See note below for Objective Data and Assessment of Progress/Goals.      Past Medical History:  Diagnosis Date   Anxiety    Arthritis    Breast cancer ([place])    Status post bilateral mastectomy   Breast reconstruction deformity    Complication of anesthesia    shaking-after mastectomies-resp and hr dropped   Depression, major    suicidal thoughts -implants, mental health, 05/11   Fibromyalgia    Hypothyroidism    Migraine headache    Narcolepsy    OSA on CPAP    Sleep apnea with use of continuous positive airway pressure (CPAP)    AHI 20 in HST, titration to 6 cm water - residual AHi 6 . remaining hypersomnic.    Past Surgical History:  Procedure Laterality Date   ARTHRODESIS METATARSALPHALANGEAL JOINT (MTPJ) Right 07/28/2017   Procedure: ARTHRODESIS  RIGHT HALLUX METATARSALPHALANGEAL JOINT (MTPJ);  Surgeon: Wylene Simmer, MD;  Location: [place];  Service: Orthopedics;  Laterality: Right;   Breast reconstructioin  7/09   flaps-bilat mastectomies   BTL     CARPAL TUNNEL RELEASE  2011    both hands   CARPOMETACARPEL SUSPENSION PLASTY Right 08/29/2014   Procedure: REMOVAL DERMASPAN SUSPENSIONPLASTY RIGHT THUMB ABDUCTOR POLLIS TRANSFER;  Surgeon: Daryll Brod, MD;  Location: [place];  Service: Orthopedics;  Laterality: Right;   CATARACT EXTRACTION W/PHACO Right 12/17/2019   Procedure: CATARACT EXTRACTION PHACO AND INTRAOCULAR LENS PLACEMENT (IOC);  Surgeon: Baruch Goldmann, MD;  [facility];  Service: Ophthalmology;  Laterality: Right;  CDE: 6.54   CATARACT EXTRACTION W/PHACO Left 12/31/2019   Procedure: CATARACT EXTRACTION PHACO AND INTRAOCULAR LENS PLACEMENT (IOC);  Surgeon: Baruch Goldmann, MD;  [facility];  Service: Ophthalmology;  Laterality: Left;  CDE: 3.66   COLONOSCOPY W/ POLYPECTOMY  4/03   tubulovillous adenoma   FINGER ARTHROSCOPY WITH CARPOMETACARPEL ([place]) ARTHROPLASTY Right 06/20/2014   Procedure: ARTHROSCOPY RIGHT THUMB CARPOMETACARPEL GRAFT JACKET INTERPOSITION PARTIAL TRAPEZIECTOMY POSSIBLE OPEN;  Surgeon: Daryll Brod, MD;  Location: [place];  Service: Orthopedics;  Laterality: Right;   GANGLION CYST EXCISION     both [phone]   HAMMER TOE SURGERY Right 07/28/2017   Procedure: THIRD HAMMERTOE CORRECTION;  Surgeon: Wylene Simmer, MD;  Location: [place];  Service: Orthopedics;  Laterality: Right;   HARDWARE REMOVAL Right 07/28/2017   Procedure: REMOVAL OF DEEP IMPLANTS;  Surgeon: Wylene Simmer, MD;  Location: [place];  Service: Orthopedics;  Laterality: Right;   hysterestomy  2005   MASTECTOMY Bilateral 2008   post- a -cath  2008   right knee arthroscopy     TOTAL SHOULDER ARTHROPLASTY Left 01/13/2017   Procedure: LEFT TOTAL SHOULDER ARTHROPLASTY;  Surgeon: Tania Ade, MD;  Location: [place];  Service: Orthopedics;  Laterality: Left;  LEFT TOTAL SHOULDER ARTHROPLASTY   TOTAL SHOULDER ARTHROPLASTY Right 02/11/2022   Procedure: TOTAL SHOULDER ARTHROPLASTY;  Surgeon: Nicholes Stairs, MD;   Location: [place];  Service: Orthopedics;  Laterality: Right;  150   TRIGGER FINGER RELEASE Right 08/29/2014   Procedure: RELEASE TRIGGER FINGER/A-1 PULLEY;  Surgeon: Daryll Brod, MD;  Location: [place];  Service: Orthopedics;  Laterality: Right;   wisdom teeth out     Patient Active Problem List   Diagnosis Date Noted   History of total shoulder replacement, right 02/11/2022   Osteoarthritis of left shoulder 01/13/2017   S/P shoulder replacement, left 01/13/2017   OSA on CPAP 04/01/2015   Subacute confusional state 04/01/2015   Hallucinations, visual 04/01/2015   Hypnapompic hallucinations 04/01/2015   Sleep apnea with use of continuous positive airway pressure (CPAP)    Hypersomnia, persistent 03/30/2013   Obstructive sleep apnea (adult) (pediatric) 03/30/2013   CONSTIPATION 11/18/2008   RECTAL BLEEDING 11/18/2008   HEMORRHOIDS, INTERNAL 11/15/2008   DIVERTICULOSIS, COLON 11/15/2008   CELLULITIS/ABSCESS, TRUNK 02/22/2008   LOW BACK PAIN 02/22/2008   DIZZINESS 02/22/2008   FATIGUE 02/22/2008   PALPITATIONS 02/22/2008   MURMUR, CARDIAC, UNDIAGNOSED 02/22/2008   BREAST CANCER, HX OF 02/22/2008   COUGH 01/05/2008   DEPRESSION 07/05/2007   UTERINE POLYP 07/05/2007   FIBROMYALGIA 07/05/2007   COLONIC POLYPS, HX OF 07/05/2007   HX, PERSONAL, CERVICAL DYSPLASIA 07/05/2007   ONSET DATE: 02/11/22   REFERRING DIAG: S/P right total shoulder arthroplasty   THERAPY DIAG:  Other symptoms and signs involving the musculoskeletal system   Acute pain of right shoulder   Stiffness of right shoulder, not elsewhere classified  Rationale for Evaluation and Treatment Rehabilitation    PERTINENT HISTORY: Patient with OA of the right shoulder underwent a right total shoulder arthroplasty on 02/11/22 completed by Dr. Stann Mainland. At follow up appointment on 02/24/22, Dr. Stann Mainland encouraged patient to wear her sling another 3 weeks for a total of 5 weeks (03/17/22). Patient arrived to  clinic without sling.     PRECAUTIONS: Shoulder At 6 weeks( 03/25/22): AA/ROM, gently increasing P/ROM, A/ROM when ROM has improved and is close to full. At 12 weeks: begin strengthening   WEIGHT BEARING RESTRICTIONS Yes NWB RUE    SUBJECTIVE: S: My shoulder isn't hurting today.  PAIN:  Are you having pain? Yes: NPRS scale: 0/10 Pain location:   Pain description:   Aggravating factors:   Relieving factors:     OBJECTIVE  HAND DOMINANCE: Right   ADLs: Overall ADLs: Unable to use RUE for any daily tasks.   FUNCTIONAL OUTCOME MEASURES: 38/100   UE ROM      Passive ROM -supine IR/er Right 03/08/2022 Right  04/01/22  Shoulder flexion 126 135  Shoulder abduction 136 180  Shoulder internal rotation 90 90  Shoulder external rotation 40 60  (Blank rows = not tested)  A/ROM not measured prior to this date. Active ROM- Supine IR/er Right 04/01/2022  Shoulder flexion 132  Shoulder abduction 96  Shoulder internal rotation 90  Shoulder external rotation 70  (Blank rows = not tested)    UE  MMT:      MMT- Assessed seated during functional task. Right 03/08/2022 Right 04/01/22  Shoulder flexion 3-/5 3-/5  Shoulder abduction 3-/5 3-/5  Shoulder internal rotation 3/5 3/5  Shoulder external rotation 3-5 3/5  (Blank rows = not tested)          TODAY'S TREATMENT: 04/06/22 -P/ROM: supine, all shoulder ranges, 5X each -AA/ROM: supine, shoulder, protraction, IR/er, flexion, horizontal abduction, abduction, 10X each -AA/ROM: sitting, shoulder, protraction, IR/er, flexion, horizontal abduction, abduction, 10X each  -Wall wash: 1' flexion  04/01/22 -P/ROM: supine, all shoulder ranges, 10X -AA/ROM: supine, shoulder, protraction, IR/er, flexion, 10X   03/23/22 -Manual Therapy: completed prior to exercises. Myofascial release completed to right upper arm, upper trapezius, and scapularis region.  -P/ROM: supine, all shoulder ranges, 10X -Isometrics: supine, shoulder, IR/er,  flexion, extension, abduction, adduction, 3x5" -A/ROM: seated, scapula, row, extension, 10X -Low thumb tacks, 1' -prot/ret/eleb/dep, 1', low level -Therapy ball: flexion, abduction, 10X with 2" hold at end stretch       PATIENT EDUCATION: Education details: AA/ROM Person educated: patient Education method: verbal explanation, demonstration Education comprehension: verbalized understanding, returned demonstration     HOME EXERCISE PROGRAM: Eval: table slides; 5/30: AA/ROM    GOALS:   SHORT TERM GOALS: Target date: 04/05/2022   Pt will be educated and independent with HEP in order to increase her ability to use her right UE as her dominant extremity for 50% or more of her daily tasks.  Baseline: Goal status: ONGOING   2.  Pt will increase her RUE P/ROM to [facility] in order to increase ability to complete upper body dressing tasks with less difficulty.  Baseline:  Goal status: MET   3.  Pt will increase her RUE strength to 3+/5 in order to complete activities at or below her shoulder level with increased shoulder and scapular stability demonstrated.  Baseline:  Goal status: ONGOING   4.  Pt will decrease her RUE fascial restrictions to moderate amount or less in order to increase the functional mobility needed to complete functional reaching tasks at home with less difficulty.  Baseline:  Goal status: ONGOING   5.  Pt will report a decrease in pain level of approximately 5/10 when using her RUE for simple light ADL tasks. Baseline:  Goal status: MET     LONG TERM GOALS: Target date: 05/03/2022   Pt will return to using her RUE as her dominant extremity for all daily tasks.  Baseline:  Goal status: ONGOING   2.  Pt will increase her RUE A/ROM to [facility] in order to reach above her shoulder level to retrieve items out of a cabinet. Baseline:  Goal status: ONGOING   3.  Pt will will increase her RUE strength to 4+/5 in order to manage weight of moderate weight using both  arms. Baseline:  Goal status: ONGOING   4.  Pt will report a decrease in pain level in her right shoulder of approximately 3/10 or less when using it to complete daily tasks.  Baseline:  Goal status: ONGOING   5.  Pt will decrease her RUE fascial restrictions to minimal amount or less in order to increase the functional mobility needed to complete higher level reaching tasks.  Baseline:  Goal status: ONGOING     ASSESSMENT:   CLINICAL IMPRESSION: A: Pt reports MD is pleased with her progress. Pt reports her shoulder has been feeling good, she was able to mop this week and has minimal difficulty with ADL completion. Occasional difficulty with  overhead reaching. Pt demonstrating ROM WFL, does have decreased strength and activity tolerance. Continued with AA/ROM today, progressing to standing. Added wall wash. Verbal cuing for form and technique, rest breaks provided as needed.    PERFORMANCE DEFICITS in functional skills including ADLs, IADLs, edema, ROM, strength, pain, fascial restrictions, mobility, decreased knowledge of precautions, and UE functional use. IMPAIRMENTS are limiting patient from ADLs, IADLs, and leisure.      PLAN: OT FREQUENCY: 2x/week   OT DURATION: 8 weeks   PLANNED INTERVENTIONS: self care/ADL training, therapeutic exercise, therapeutic activity, neuromuscular re-education, manual therapy, passive range of motion, electrical stimulation, ultrasound, moist heat, cryotherapy, patient/family education, coping strategies training, and DME and/or AE instructions       CONSULTED AND AGREED WITH PLAN OF CARE: Patient   PLAN FOR NEXT SESSION: P: Follow up on HEP, continue progressing with protocol, add proximal shoulder strengthening in supine     Guadelupe Sabin, OTR/L  [phone] 04/06/2022, 3:34 PM

## 2022-04-06 NOTE — Patient Instructions (Signed)

## 2022-04-09 ENCOUNTER — Ambulatory Visit (HOSPITAL_COMMUNITY): Payer: No Typology Code available for payment source | Attending: Orthopedic Surgery | Admitting: Occupational Therapy

## 2022-04-09 DIAGNOSIS — Z96611 Presence of right artificial shoulder joint: Secondary | ICD-10-CM | POA: Diagnosis not present

## 2022-04-09 DIAGNOSIS — M19011 Primary osteoarthritis, right shoulder: Secondary | ICD-10-CM | POA: Insufficient documentation

## 2022-04-09 DIAGNOSIS — Z4789 Encounter for other orthopedic aftercare: Secondary | ICD-10-CM | POA: Diagnosis not present

## 2022-04-09 DIAGNOSIS — M25511 Pain in right shoulder: Secondary | ICD-10-CM

## 2022-04-09 DIAGNOSIS — R29898 Other symptoms and signs involving the musculoskeletal system: Secondary | ICD-10-CM

## 2022-04-09 NOTE — Patient Instructions (Signed)
  1) Flexion Wall Stretch    Face wall, place affected handon wall in front of you. Slide hand up the wall  and lean body in towards the wall. Hold for 10 seconds. Repeat 3-5 times. 1-2 times/day.       Stand beside wall with right arm against wall, slide hand up the wall and lean body in towards the wall. Hold for 10 seconds. Repeat 3-5 times. 1-2 times/day.

## 2022-04-09 NOTE — Therapy (Signed)
OUTPATIENT OCCUPATIONAL THERAPY TREATMENT NOTE   Patient Name: [name] MRN: [id number] DOB:November 26, 1960, 61 y.o., female Today's Date: 04/09/2022  PCP: Sable Feil MD REFERRING PROVIDER: Nicholes Stairs, MD   END OF SESSION:   OT End of Session - 04/09/22 1416     Visit Number 7    Number of Visits 16    Date for OT Re-Evaluation 05/03/22    Authorization Type A[employer]na [place] Preferred    Authorization Time Period No copay. 90 combined visits (3 visits used). no auth needed    Authorization - Visit Number 10    Authorization - Number of Visits 90    Progress Note Due on Visit 76    OT Start Time 1314    OT Stop Time 1358    OT Time Calculation (min) 44 min    Activity Tolerance Patient tolerated treatment well    Behavior During Therapy WFL for tasks assessed/performed                 Past Medical History:  Diagnosis Date   Anxiety    Arthritis    Breast cancer ([place])    Status post bilateral mastectomy   Breast reconstruction deformity    Complication of anesthesia    shaking-after mastectomies-resp and hr dropped   Depression, major    suicidal thoughts -implants, mental health, 05/11   Fibromyalgia    Hypothyroidism    Migraine headache    Narcolepsy    OSA on CPAP    Sleep apnea with use of continuous positive airway pressure (CPAP)    AHI 20 in HST, titration to 6 cm water - residual AHi 6 . remaining hypersomnic.    Past Surgical History:  Procedure Laterality Date   ARTHRODESIS METATARSALPHALANGEAL JOINT (MTPJ) Right 07/28/2017   Procedure: ARTHRODESIS  RIGHT HALLUX METATARSALPHALANGEAL JOINT (MTPJ);  Surgeon: Wylene Simmer, MD;  Location: [place];  Service: Orthopedics;  Laterality: Right;   Breast reconstructioin  7/09   flaps-bilat mastectomies   BTL     CARPAL TUNNEL RELEASE  2011   both hands   CARPOMETACARPEL SUSPENSION PLASTY Right 08/29/2014   Procedure: REMOVAL DERMASPAN SUSPENSIONPLASTY RIGHT THUMB  ABDUCTOR POLLIS TRANSFER;  Surgeon: Daryll Brod, MD;  Location: [place];  Service: Orthopedics;  Laterality: Right;   CATARACT EXTRACTION W/PHACO Right 12/17/2019   Procedure: CATARACT EXTRACTION PHACO AND INTRAOCULAR LENS PLACEMENT (IOC);  Surgeon: Baruch Goldmann, MD;  Location: [facility];  Service: Ophthalmology;  Laterality: Right;  CDE: 6.54   CATARACT EXTRACTION W/PHACO Left 12/31/2019   Procedure: CATARACT EXTRACTION PHACO AND INTRAOCULAR LENS PLACEMENT (IOC);  Surgeon: Baruch Goldmann, MD;  Location: [facility];  Service: Ophthalmology;  Laterality: Left;  CDE: 3.66   COLONOSCOPY W/ POLYPECTOMY  4/03   tubulovillous adenoma   FINGER ARTHROSCOPY WITH CARPOMETACARPEL ([place]) ARTHROPLASTY Right 06/20/2014   Procedure: ARTHROSCOPY RIGHT THUMB CARPOMETACARPEL GRAFT JACKET INTERPOSITION PARTIAL TRAPEZIECTOMY POSSIBLE OPEN;  Surgeon: Daryll Brod, MD;  Location: [place];  Service: Orthopedics;  Laterality: Right;   GANGLION CYST EXCISION     both [phone]   HAMMER TOE SURGERY Right 07/28/2017   Procedure: THIRD HAMMERTOE CORRECTION;  Surgeon: Wylene Simmer, MD;  Location: [place];  Service: Orthopedics;  Laterality: Right;   HARDWARE REMOVAL Right 07/28/2017   Procedure: REMOVAL OF DEEP IMPLANTS;  Surgeon: Wylene Simmer, MD;  Location: [place];  Service: Orthopedics;  Laterality: Right;   hysterestomy  2005   MASTECTOMY  Bilateral 2008   post- a -cath  2008   right knee arthroscopy     TOTAL SHOULDER ARTHROPLASTY Left 01/13/2017   Procedure: LEFT TOTAL SHOULDER ARTHROPLASTY;  Surgeon: Tania Ade, MD;  Location: [place];  Service: Orthopedics;  Laterality: Left;  LEFT TOTAL SHOULDER ARTHROPLASTY   TOTAL SHOULDER ARTHROPLASTY Right 02/11/2022   Procedure: TOTAL SHOULDER ARTHROPLASTY;  Surgeon: Nicholes Stairs, MD;  Location: [place];  Service: Orthopedics;  Laterality: Right;  150   TRIGGER FINGER RELEASE Right 08/29/2014   Procedure:  RELEASE TRIGGER FINGER/A-1 PULLEY;  Surgeon: Daryll Brod, MD;  Location: [place];  Service: Orthopedics;  Laterality: Right;   wisdom teeth out     Patient Active Problem List   Diagnosis Date Noted   History of total shoulder replacement, right 02/11/2022   Osteoarthritis of left shoulder 01/13/2017   S/P shoulder replacement, left 01/13/2017   OSA on CPAP 04/01/2015   Subacute confusional state 04/01/2015   Hallucinations, visual 04/01/2015   Hypnapompic hallucinations 04/01/2015   Sleep apnea with use of continuous positive airway pressure (CPAP)    Hypersomnia, persistent 03/30/2013   Obstructive sleep apnea (adult) (pediatric) 03/30/2013   CONSTIPATION 11/18/2008   RECTAL BLEEDING 11/18/2008   HEMORRHOIDS, INTERNAL 11/15/2008   DIVERTICULOSIS, COLON 11/15/2008   CELLULITIS/ABSCESS, TRUNK 02/22/2008   LOW BACK PAIN 02/22/2008   DIZZINESS 02/22/2008   FATIGUE 02/22/2008   PALPITATIONS 02/22/2008   MURMUR, CARDIAC, UNDIAGNOSED 02/22/2008   BREAST CANCER, HX OF 02/22/2008   COUGH 01/05/2008   DEPRESSION 07/05/2007   UTERINE POLYP 07/05/2007   FIBROMYALGIA 07/05/2007   COLONIC POLYPS, HX OF 07/05/2007   HX, PERSONAL, CERVICAL DYSPLASIA 07/05/2007   ONSET DATE: 02/11/22   REFERRING DIAG: S/P right total shoulder arthroplasty   THERAPY DIAG:  Other symptoms and signs involving the musculoskeletal system   Acute pain of right shoulder   Stiffness of right shoulder, not elsewhere classified  Rationale for Evaluation and Treatment Rehabilitation    PERTINENT HISTORY: Patient with OA of the right shoulder underwent a right total shoulder arthroplasty on 02/11/22 completed by Dr. Stann Mainland. At follow up appointment on 02/24/22, Dr. Stann Mainland encouraged patient to wear her sling another 3 weeks for a total of 5 weeks (03/17/22). Patient arrived to clinic without sling.     PRECAUTIONS: Shoulder At 6 weeks( 03/25/22): AA/ROM, gently increasing P/ROM, A/ROM when ROM has  improved and is close to full. At 12 weeks: begin strengthening   WEIGHT BEARING RESTRICTIONS Yes NWB RUE    SUBJECTIVE: S: I fell yesterday and landed on my arm.   PAIN:   Are you having pain? Yes: NPRS scale: 3/10 Pain location: shoulder Pain description: aching Aggravating factors: over use Relieving factors: ice, heating pad    OBJECTIVE  HAND DOMINANCE: Right   ADLs: Overall ADLs: Unable to use RUE for any daily tasks.   FUNCTIONAL OUTCOME MEASURES: 38/100   UE ROM      Passive ROM -supine IR/er Right 03/08/2022 Right  04/01/22  Shoulder flexion 126 135  Shoulder abduction 136 180  Shoulder internal rotation 90 90  Shoulder external rotation 40 60  (Blank rows = not tested)  A/ROM not measured prior to this date. Active ROM- Supine IR/er Right 04/01/2022  Shoulder flexion 132  Shoulder abduction 96  Shoulder internal rotation 90  Shoulder external rotation 70  (Blank rows = not tested)    UE MMT:      MMT- Assessed seated during functional task. Right 03/08/2022  Right 04/01/22  Shoulder flexion 3-/5 3-/5  Shoulder abduction 3-/5 3-/5  Shoulder internal rotation 3/5 3/5  Shoulder external rotation 3-5 3/5  (Blank rows = not tested)          TODAY'S TREATMENT:  04/09/22 - manual therapy: myofascial release to upper arm, posterior shoulder- completed for increased ROM  - P/ROM: supine all directions 5x each direction  - AA/ROM: supine all directions 10x each direction  - A/ROM standing: shoulder flexion, abduction, external rotation 5x (close to full ROM in flexion and abduction, external rotation [facility])  - shoulder stretches at wall: flexion and abduction 10 second hold 3x  - Proximal strengthening exercises: seated in chair, paddle, criss cross, circle, reverse circle- each exercise completed 10x continuously going into next exercise without stopping.    04/06/22 -P/ROM: supine, all shoulder ranges, 5X each -AA/ROM: supine, shoulder, protraction,  IR/er, flexion, horizontal abduction, abduction, 10X each -AA/ROM: sitting, shoulder, protraction, IR/er, flexion, horizontal abduction, abduction, 10X each  -Wall wash: 1' flexion  04/01/22 -P/ROM: supine, all shoulder ranges, 10X -AA/ROM: supine, shoulder, protraction, IR/er, flexion, 10X        PATIENT EDUCATION: Education details: shoulder stretches on wall  Person educated: patient Education method: verbal explanation, demonstration Education comprehension: verbalized understanding, returned demonstration     HOME EXERCISE PROGRAM: Eval: table slides; 5/30: AA/ROM 6/2: shoulder stretches on wall: flexion and abduction     GOALS:   SHORT TERM GOALS: Target date: 04/05/2022   Pt will be educated and independent with HEP in order to increase her ability to use her right UE as her dominant extremity for 50% or more of her daily tasks.  Baseline: Goal status: ONGOING   2.  Pt will increase her RUE P/ROM to [facility] in order to increase ability to complete upper body dressing tasks with less difficulty.  Baseline:  Goal status: MET   3.  Pt will increase her RUE strength to 3+/5 in order to complete activities at or below her shoulder level with increased shoulder and scapular stability demonstrated.  Baseline:  Goal status: ONGOING   4.  Pt will decrease her RUE fascial restrictions to moderate amount or less in order to increase the functional mobility needed to complete functional reaching tasks at home with less difficulty.  Baseline:  Goal status: ONGOING   5.  Pt will report a decrease in pain level of approximately 5/10 when using her RUE for simple light ADL tasks. Baseline:  Goal status: MET     LONG TERM GOALS: Target date: 05/03/2022   Pt will return to using her RUE as her dominant extremity for all daily tasks.  Baseline:  Goal status: ONGOING   2.  Pt will increase her RUE A/ROM to [facility] in order to reach above her shoulder level to retrieve items out of a  cabinet. Baseline:  Goal status: ONGOING   3.  Pt will will increase her RUE strength to 4+/5 in order to manage weight of moderate weight using both arms. Baseline:  Goal status: ONGOING   4.  Pt will report a decrease in pain level in her right shoulder of approximately 3/10 or less when using it to complete daily tasks.  Baseline:  Goal status: ONGOING   5.  Pt will decrease her RUE fascial restrictions to minimal amount or less in order to increase the functional mobility needed to complete higher level reaching tasks.  Baseline:  Goal status: ONGOING     ASSESSMENT:   CLINICAL IMPRESSION:  A: Pt reports that she has been using her RUE to complete task/ADLs such as laundry and overhead reaching when putting away dishes. She reports low- no pain levels throughout the day. Pt stated that she fell yesterday on right arm when attempting to lift her dog, she stated that she is just sore and bruised but feels that her shoulder is okay. Able to progress to A/ROM in standing, almost reaching full ROM in all directions. Completed stretches standing at wall for shoulder flexion and abduction, added these exercises to HEP- pt. Verbalized understanding and returned demonstration. Completed proximal shoulder strengthening exercises in seated position. VC for body posture during wall stretches and proximal strengthening.    PERFORMANCE DEFICITS in functional skills including ADLs, IADLs, edema, ROM, strength, pain, fascial restrictions, mobility, decreased knowledge of precautions, and UE functional use. IMPAIRMENTS are limiting patient from ADLs, IADLs, and leisure.      PLAN: OT FREQUENCY: 2x/week   OT DURATION: 8 weeks   PLANNED INTERVENTIONS: self care/ADL training, therapeutic exercise, therapeutic activity, neuromuscular re-education, manual therapy, passive range of motion, electrical stimulation, ultrasound, moist heat, cryotherapy, patient/family education, coping strategies training,  and DME and/or AE instructions       CONSULTED AND AGREED WITH PLAN OF CARE: Patient   PLAN FOR NEXT SESSION: P: Follow up on HEP, continue progressing with protocol, functional reaching task (cones on shelf/squigs/ADL kitchen shelf)     Arvil Persons, OTR/L  [phone] 04/09/2022, 2:20 PM

## 2022-04-12 ENCOUNTER — Ambulatory Visit (HOSPITAL_COMMUNITY): Payer: No Typology Code available for payment source | Attending: Orthopedic Surgery | Admitting: Occupational Therapy

## 2022-04-14 ENCOUNTER — Encounter (HOSPITAL_COMMUNITY): Payer: Self-pay | Admitting: Occupational Therapy

## 2022-04-14 ENCOUNTER — Ambulatory Visit (HOSPITAL_COMMUNITY): Payer: No Typology Code available for payment source | Attending: Orthopedic Surgery | Admitting: Occupational Therapy

## 2022-04-14 DIAGNOSIS — M25511 Pain in right shoulder: Secondary | ICD-10-CM | POA: Diagnosis present

## 2022-04-14 DIAGNOSIS — M25611 Stiffness of right shoulder, not elsewhere classified: Secondary | ICD-10-CM | POA: Diagnosis present

## 2022-04-14 DIAGNOSIS — R29898 Other symptoms and signs involving the musculoskeletal system: Secondary | ICD-10-CM | POA: Diagnosis not present

## 2022-04-14 NOTE — Therapy (Signed)
OUTPATIENT OCCUPATIONAL THERAPY TREATMENT NOTE (Mini-reassessment)   Patient Name: [name] MRN: [id number] DOB:06-30-1961, 61 y.o., female Today's Date: 04/14/2022  PCP: Sable Feil MD REFERRING PROVIDER: Nicholes Stairs, MD   END OF SESSION:   OT End of Session - 04/14/22 1406     Visit Number 8    Number of Visits 16    Date for OT Re-Evaluation 05/03/22    Authorization Type [employer] [place] Preferred    Authorization Time Period No copay. 90 combined visits (3 visits used). no auth needed    Authorization - Visit Number 10    Authorization - Number of Visits 90    Progress Note Due on Visit 32    OT Start Time 1307    OT Stop Time 1346    OT Time Calculation (min) 39 min    Activity Tolerance Patient tolerated treatment well    Behavior During Therapy WFL for tasks assessed/performed                  Past Medical History:  Diagnosis Date   Anxiety    Arthritis    Breast cancer ([place])    Status post bilateral mastectomy   Breast reconstruction deformity    Complication of anesthesia    shaking-after mastectomies-resp and hr dropped   Depression, major    suicidal thoughts -implants, mental health, 05/11   Fibromyalgia    Hypothyroidism    Migraine headache    Narcolepsy    OSA on CPAP    Sleep apnea with use of continuous positive airway pressure (CPAP)    AHI 20 in HST, titration to 6 cm water - residual AHi 6 . remaining hypersomnic.    Past Surgical History:  Procedure Laterality Date   ARTHRODESIS METATARSALPHALANGEAL JOINT (MTPJ) Right 07/28/2017   Procedure: ARTHRODESIS  RIGHT HALLUX METATARSALPHALANGEAL JOINT (MTPJ);  Surgeon: Wylene Simmer, MD;  Location: [place];  Service: Orthopedics;  Laterality: Right;   Breast reconstructioin  7/09   flaps-bilat mastectomies   BTL     CARPAL TUNNEL RELEASE  2011   both hands   CARPOMETACARPEL SUSPENSION PLASTY Right 08/29/2014   Procedure: REMOVAL DERMASPAN  SUSPENSIONPLASTY RIGHT THUMB ABDUCTOR POLLIS TRANSFER;  Surgeon: Daryll Brod, MD;  Location: [place];  Service: Orthopedics;  Laterality: Right;   CATARACT EXTRACTION W/PHACO Right 12/17/2019   Procedure: CATARACT EXTRACTION PHACO AND INTRAOCULAR LENS PLACEMENT (IOC);  Surgeon: Baruch Goldmann, MD;  [facility]: [facility];  Service: Ophthalmology;  Laterality: Right;  CDE: 6.54   CATARACT EXTRACTION W/PHACO Left 12/31/2019   Procedure: CATARACT EXTRACTION PHACO AND INTRAOCULAR LENS PLACEMENT (IOC);  Surgeon: Baruch Goldmann, MD;  Location: [facility];  Service: Ophthalmology;  Laterality: Left;  CDE: 3.66   COLONOSCOPY W/ POLYPECTOMY  4/03   tubulovillous adenoma   FINGER ARTHROSCOPY WITH CARPOMETACARPEL ([place]) ARTHROPLASTY Right 06/20/2014   Procedure: ARTHROSCOPY RIGHT THUMB CARPOMETACARPEL GRAFT JACKET INTERPOSITION PARTIAL TRAPEZIECTOMY POSSIBLE OPEN;  Surgeon: Daryll Brod, MD;  Location: [place];  Service: Orthopedics;  Laterality: Right;   GANGLION CYST EXCISION     both [phone]   HAMMER TOE SURGERY Right 07/28/2017   Procedure: THIRD HAMMERTOE CORRECTION;  Surgeon: Wylene Simmer, MD;  Location: [place];  Service: Orthopedics;  Laterality: Right;   HARDWARE REMOVAL Right 07/28/2017   Procedure: REMOVAL OF DEEP IMPLANTS;  Surgeon: Wylene Simmer, MD;  Location: [place];  Service: Orthopedics;  Laterality: Right;   hysterestomy  2005  MASTECTOMY Bilateral 2008   post- a -cath  2008   right knee arthroscopy     TOTAL SHOULDER ARTHROPLASTY Left 01/13/2017   Procedure: LEFT TOTAL SHOULDER ARTHROPLASTY;  Surgeon: Tania Ade, MD;  Location: [place];  Service: Orthopedics;  Laterality: Left;  LEFT TOTAL SHOULDER ARTHROPLASTY   TOTAL SHOULDER ARTHROPLASTY Right 02/11/2022   Procedure: TOTAL SHOULDER ARTHROPLASTY;  Surgeon: Nicholes Stairs, MD;  Location: [place];  Service: Orthopedics;  Laterality: Right;  150   TRIGGER FINGER RELEASE Right  08/29/2014   Procedure: RELEASE TRIGGER FINGER/A-1 PULLEY;  Surgeon: Daryll Brod, MD;  Location: [place];  Service: Orthopedics;  Laterality: Right;   wisdom teeth out     Patient Active Problem List   Diagnosis Date Noted   History of total shoulder replacement, right 02/11/2022   Osteoarthritis of left shoulder 01/13/2017   S/P shoulder replacement, left 01/13/2017   OSA on CPAP 04/01/2015   Subacute confusional state 04/01/2015   Hallucinations, visual 04/01/2015   Hypnapompic hallucinations 04/01/2015   Sleep apnea with use of continuous positive airway pressure (CPAP)    Hypersomnia, persistent 03/30/2013   Obstructive sleep apnea (adult) (pediatric) 03/30/2013   CONSTIPATION 11/18/2008   RECTAL BLEEDING 11/18/2008   HEMORRHOIDS, INTERNAL 11/15/2008   DIVERTICULOSIS, COLON 11/15/2008   CELLULITIS/ABSCESS, TRUNK 02/22/2008   LOW BACK PAIN 02/22/2008   DIZZINESS 02/22/2008   FATIGUE 02/22/2008   PALPITATIONS 02/22/2008   MURMUR, CARDIAC, UNDIAGNOSED 02/22/2008   BREAST CANCER, HX OF 02/22/2008   COUGH 01/05/2008   DEPRESSION 07/05/2007   UTERINE POLYP 07/05/2007   FIBROMYALGIA 07/05/2007   COLONIC POLYPS, HX OF 07/05/2007   HX, PERSONAL, CERVICAL DYSPLASIA 07/05/2007   ONSET DATE: 02/11/22   REFERRING DIAG: S/P right total shoulder arthroplasty   THERAPY DIAG:  Other symptoms and signs involving the musculoskeletal system   Acute pain of right shoulder   Stiffness of right shoulder, not elsewhere classified  Rationale for Evaluation and Treatment Rehabilitation    PERTINENT HISTORY: Patient with OA of the right shoulder underwent a right total shoulder arthroplasty on 02/11/22 completed by Dr. Stann Mainland. At follow up appointment on 02/24/22, Dr. Stann Mainland encouraged patient to wear her sling another 3 weeks for a total of 5 weeks (03/17/22). Patient arrived to clinic without sling.     PRECAUTIONS: Shoulder At 6 weeks( 03/25/22): AA/ROM, gently increasing  P/ROM, A/ROM when ROM has improved and is close to full. At 12 weeks: begin strengthening   WEIGHT BEARING RESTRICTIONS Yes NWB RUE    SUBJECTIVE: S: I can bathe a little easier with it.   PAIN:   Are you having pain? No: NPRS scale: 0/10 Pain location:  Pain description:  Aggravating factors:  Relieving factors:     OBJECTIVE  HAND DOMINANCE: Right   ADLs: Overall ADLs: Unable to use RUE for any daily tasks.   FUNCTIONAL OUTCOME MEASURES: 38/100   UE ROM      Passive ROM -supine IR/er Right 03/08/2022 Right  04/01/22 Right 04/14/22  Shoulder flexion 126 135 159  Shoulder abduction 136 180 180  Shoulder internal rotation 90 90 90  Shoulder external rotation 40 60 65  (Blank rows = not tested)   Active ROM- Supine IR/er Right 04/01/2022 Right 04/14/22  Shoulder flexion 132 140  Shoulder abduction 96 160  Shoulder internal rotation 90 90  Shoulder external rotation 70 76  (Blank rows = not tested)    UE MMT:      MMT- Assessed seated during  functional task. Right 03/08/2022 Right 04/01/22 Right 04/14/22  Shoulder flexion 3-/5 3-/5 4/5  Shoulder abduction 3-/5 3-/5 4/5  Shoulder internal rotation 3/5 3/5 4/5  Shoulder external rotation 3-5 3/5 4-/5  (Blank rows = not tested)          TODAY'S TREATMENT: 04/14/22 - manual therapy: myofascial release to upper arm, posterior shoulder- completed for increased ROM  - P/ROM: supine all directions 5x each direction  -A/ROM: seated-protraction, flexion, horizontal abduction, er/IR, abduction, 10X each -Shoulder stretches: flexion wall stretch, IR behind back with horizontal towel, 3x10" -Scapular Theraband: red-row, extension, 10X each -Overhead lacing: beginning at top and lacing down, then reversing and removing  04/09/22 - manual therapy: myofascial release to upper arm, posterior shoulder- completed for increased ROM  - P/ROM: supine all directions 5x each direction  - AA/ROM: supine all directions 10x each  direction  - A/ROM standing: shoulder flexion, abduction, external rotation 5x (close to full ROM in flexion and abduction, external rotation [facility])  - shoulder stretches at wall: flexion and abduction 10 second hold 3x  - Proximal strengthening exercises: seated in chair, paddle, criss cross, circle, reverse circle- each exercise completed 10x continuously going into next exercise without stopping.    04/06/22 -P/ROM: supine, all shoulder ranges, 5X each -AA/ROM: supine, shoulder, protraction, IR/er, flexion, horizontal abduction, abduction, 10X each -AA/ROM: sitting, shoulder, protraction, IR/er, flexion, horizontal abduction, abduction, 10X each  -Wall wash: 1' flexion     PATIENT EDUCATION: Education details: shoulder stretches on wall  Person educated: patient Education method: verbal explanation, demonstration Education comprehension: verbalized understanding, returned demonstration     HOME EXERCISE PROGRAM: Eval: table slides; 5/30: AA/ROM 6/2: shoulder stretches on wall: flexion and abduction     GOALS:   SHORT TERM GOALS: Target date: 04/05/2022   Pt will be educated and independent with HEP in order to increase her ability to use her right UE as her dominant extremity for 50% or more of her daily tasks.  Baseline: Goal status: MET   2.  Pt will increase her RUE P/ROM to [facility] in order to increase ability to complete upper body dressing tasks with less difficulty.  Baseline:  Goal status: MET   3.  Pt will increase her RUE strength to 3+/5 in order to complete activities at or below her shoulder level with increased shoulder and scapular stability demonstrated.  Baseline:  Goal status: MET   4.  Pt will decrease her RUE fascial restrictions to moderate amount or less in order to increase the functional mobility needed to complete functional reaching tasks at home with less difficulty.  Baseline:  Goal status: MET   5.  Pt will report a decrease in pain level of  approximately 5/10 when using her RUE for simple light ADL tasks. Baseline:  Goal status: MET     LONG TERM GOALS: Target date: 05/03/2022   Pt will return to using her RUE as her dominant extremity for all daily tasks.   Goal status: ONGOING   2.  Pt will increase her RUE A/ROM to [facility] in order to reach above her shoulder level to retrieve items out of a cabinet.  Goal status: ONGOING   3.  Pt will will increase her RUE strength to 4+/5 in order to manage weight of moderate weight using both arms.  Goal status: ONGOING   4.  Pt will report a decrease in pain level in her right shoulder of approximately 3/10 or less when using it to complete  daily tasks.    Goal status: ONGOING   5.  Pt will decrease her RUE fascial restrictions to minimal amount or less in order to increase the functional mobility needed to complete higher level reaching tasks.   Goal status: ONGOING     ASSESSMENT:   CLINICAL IMPRESSION: A: Pt reports that she has been completing her HEP without difficulty. Mini-reassessment completed this session, pt demonstrating improvement in ROM and strength. Pt has met all STGS, continuing to progress towards LTGs. Progressed to all A/ROM today, added overhead lacing and scapular theraband. Occasional fatigue with rest breaks provided as needed. Verbal cuing for form and technique during exercises.    PERFORMANCE DEFICITS in functional skills including ADLs, IADLs, edema, ROM, strength, pain, fascial restrictions, mobility, decreased knowledge of precautions, and UE functional use. IMPAIRMENTS are limiting patient from ADLs, IADLs, and leisure.      PLAN: OT FREQUENCY: 2x/week   OT DURATION: 8 weeks   PLANNED INTERVENTIONS: self care/ADL training, therapeutic exercise, therapeutic activity, neuromuscular re-education, manual therapy, passive range of motion, electrical stimulation, ultrasound, moist heat, cryotherapy, patient/family education, coping strategies  training, and DME and/or AE instructions       CONSULTED AND AGREED WITH PLAN OF CARE: Patient   PLAN FOR NEXT SESSION: P: continue with scapular theraband adding retraction, functional reaching task into cabinets    Guadelupe Sabin, OTR/L  [phone] 04/14/2022, 2:07 PM

## 2022-04-19 ENCOUNTER — Ambulatory Visit (HOSPITAL_COMMUNITY): Payer: No Typology Code available for payment source | Admitting: Occupational Therapy

## 2022-04-21 ENCOUNTER — Encounter (HOSPITAL_COMMUNITY): Payer: No Typology Code available for payment source | Admitting: Occupational Therapy

## 2022-04-26 ENCOUNTER — Encounter (HOSPITAL_COMMUNITY): Payer: No Typology Code available for payment source | Admitting: Occupational Therapy

## 2022-04-28 ENCOUNTER — Encounter (HOSPITAL_COMMUNITY): Payer: No Typology Code available for payment source | Admitting: Occupational Therapy

## 2022-05-03 ENCOUNTER — Ambulatory Visit (HOSPITAL_COMMUNITY): Payer: No Typology Code available for payment source | Attending: Orthopedic Surgery

## 2022-05-03 ENCOUNTER — Encounter (HOSPITAL_COMMUNITY): Payer: Self-pay

## 2022-05-03 DIAGNOSIS — R29898 Other symptoms and signs involving the musculoskeletal system: Secondary | ICD-10-CM | POA: Insufficient documentation

## 2022-05-03 DIAGNOSIS — M25511 Pain in right shoulder: Secondary | ICD-10-CM | POA: Insufficient documentation

## 2022-05-03 DIAGNOSIS — M25611 Stiffness of right shoulder, not elsewhere classified: Secondary | ICD-10-CM | POA: Diagnosis present

## 2022-05-05 ENCOUNTER — Ambulatory Visit (HOSPITAL_COMMUNITY): Payer: No Typology Code available for payment source

## 2022-05-05 ENCOUNTER — Encounter (HOSPITAL_COMMUNITY): Payer: Self-pay

## 2022-05-05 DIAGNOSIS — M25511 Pain in right shoulder: Secondary | ICD-10-CM

## 2022-05-05 DIAGNOSIS — R29898 Other symptoms and signs involving the musculoskeletal system: Secondary | ICD-10-CM

## 2022-05-05 DIAGNOSIS — M25611 Stiffness of right shoulder, not elsewhere classified: Secondary | ICD-10-CM

## 2022-05-05 NOTE — Therapy (Signed)
OUTPATIENT OCCUPATIONAL THERAPY TREATMENT NOTE (Reassessment)   Patient Name: [name] MRN: [id number] DOB:[birth year], 61 y.o., female Today's Date: 05/05/2022  PCP: Sable Feil MD REFERRING PROVIDER: Nicholes Stairs, MD   END OF SESSION:   OT End of Session - 05/05/22 1040     Visit Number 10    Number of Visits 18    Date for OT Re-Evaluation 06/04/22    Authorization Type A[employer]na [place] Preferred    Authorization Time Period No copay. 90 combined visits (3 visits used). no auth needed    Authorization - Visit Number 11    Authorization - Number of Visits 90    Progress Note Due on Visit 81    OT Start Time 1036    OT Stop Time 1120    OT Time Calculation (min) 44 min    Activity Tolerance Patient tolerated treatment well    Behavior During Therapy WFL for tasks assessed/performed                  Past Medical History:  Diagnosis Date   Anxiety    Arthritis    Breast cancer ([place])    Status post bilateral mastectomy   Breast reconstruction deformity    Complication of anesthesia    shaking-after mastectomies-resp and hr dropped   Depression, major    suicidal thoughts -implants, mental health, 05/11   Fibromyalgia    Hypothyroidism    Migraine headache    Narcolepsy    OSA on CPAP    Sleep apnea with use of continuous positive airway pressure (CPAP)    AHI 20 in HST, titration to 6 cm water - residual AHi 6 . remaining hypersomnic.    Past Surgical History:  Procedure Laterality Date   ARTHRODESIS METATARSALPHALANGEAL JOINT (MTPJ) Right 07/28/2017   Procedure: ARTHRODESIS  RIGHT HALLUX METATARSALPHALANGEAL JOINT (MTPJ);  Surgeon: Wylene Simmer, MD;  Location: [place];  Service: Orthopedics;  Laterality: Right;   Breast reconstructioin  7/09   flaps-bilat mastectomies   BTL     CARPAL TUNNEL RELEASE  2011   both hands   CARPOMETACARPEL SUSPENSION PLASTY Right 08/29/2014   Procedure: REMOVAL DERMASPAN  SUSPENSIONPLASTY RIGHT THUMB ABDUCTOR POLLIS TRANSFER;  Surgeon: Daryll Brod, MD;  Location: [place];  Service: Orthopedics;  Laterality: Right;   CATARACT EXTRACTION W/PHACO Right 12/17/2019   Procedure: CATARACT EXTRACTION PHACO AND INTRAOCULAR LENS PLACEMENT (IOC);  Surgeon: Baruch Goldmann, MD;  Location: [facility];  Service: Ophthalmology;  Laterality: Right;  CDE: 6.54   CATARACT EXTRACTION W/PHACO Left 12/31/2019   Procedure: CATARACT EXTRACTION PHACO AND INTRAOCULAR LENS PLACEMENT (IOC);  Surgeon: Baruch Goldmann, MD;  Location: [facility];  Service: Ophthalmology;  Laterality: Left;  CDE: 3.66   COLONOSCOPY W/ POLYPECTOMY  4/03   tubulovillous adenoma   FINGER ARTHROSCOPY WITH CARPOMETACARPEL ([place]) ARTHROPLASTY Right 06/20/2014   Procedure: ARTHROSCOPY RIGHT THUMB CARPOMETACARPEL GRAFT JACKET INTERPOSITION PARTIAL TRAPEZIECTOMY POSSIBLE OPEN;  Surgeon: Daryll Brod, MD;  Location: [place];  Service: Orthopedics;  Laterality: Right;   GANGLION CYST EXCISION     both [phone]   HAMMER TOE SURGERY Right 07/28/2017   Procedure: THIRD HAMMERTOE CORRECTION;  Surgeon: Wylene Simmer, MD;  Location: [place];  Service: Orthopedics;  Laterality: Right;   HARDWARE REMOVAL Right 07/28/2017   Procedure: REMOVAL OF DEEP IMPLANTS;  Surgeon: Wylene Simmer, MD;  Location: [place];  Service: Orthopedics;  Laterality: Right;   hysterestomy  2005  MASTECTOMY Bilateral 2008   post- a -cath  2008   right knee arthroscopy     TOTAL SHOULDER ARTHROPLASTY Left 01/13/2017   Procedure: LEFT TOTAL SHOULDER ARTHROPLASTY;  Surgeon: Tania Ade, MD;  Location: [place];  Service: Orthopedics;  Laterality: Left;  LEFT TOTAL SHOULDER ARTHROPLASTY   TOTAL SHOULDER ARTHROPLASTY Right 02/11/2022   Procedure: TOTAL SHOULDER ARTHROPLASTY;  Surgeon: Nicholes Stairs, MD;  Location: [place];  Service: Orthopedics;  Laterality: Right;  150   TRIGGER FINGER RELEASE Right  08/29/2014   Procedure: RELEASE TRIGGER FINGER/A-1 PULLEY;  Surgeon: Daryll Brod, MD;  Location: [place];  Service: Orthopedics;  Laterality: Right;   wisdom teeth out     Patient Active Problem List   Diagnosis Date Noted   History of total shoulder replacement, right 02/11/2022   Osteoarthritis of left shoulder 01/13/2017   S/P shoulder replacement, left 01/13/2017   OSA on CPAP 04/01/2015   Subacute confusional state 04/01/2015   Hallucinations, visual 04/01/2015   Hypnapompic hallucinations 04/01/2015   Sleep apnea with use of continuous positive airway pressure (CPAP)    Hypersomnia, persistent 03/30/2013   Obstructive sleep apnea (adult) (pediatric) 03/30/2013   CONSTIPATION 11/18/2008   RECTAL BLEEDING 11/18/2008   HEMORRHOIDS, INTERNAL 11/15/2008   DIVERTICULOSIS, COLON 11/15/2008   CELLULITIS/ABSCESS, TRUNK 02/22/2008   LOW BACK PAIN 02/22/2008   DIZZINESS 02/22/2008   FATIGUE 02/22/2008   PALPITATIONS 02/22/2008   MURMUR, CARDIAC, UNDIAGNOSED 02/22/2008   BREAST CANCER, HX OF 02/22/2008   COUGH 01/05/2008   DEPRESSION 07/05/2007   UTERINE POLYP 07/05/2007   FIBROMYALGIA 07/05/2007   COLONIC POLYPS, HX OF 07/05/2007   HX, PERSONAL, CERVICAL DYSPLASIA 07/05/2007   ONSET DATE: 02/11/22   REFERRING DIAG: S/P right total shoulder arthroplasty   THERAPY DIAG:  Other symptoms and signs involving the musculoskeletal system   Acute pain of right shoulder   Stiffness of right shoulder, not elsewhere classified  Rationale for Evaluation and Treatment Rehabilitation    PERTINENT HISTORY: Patient with OA of the right shoulder underwent a right total shoulder arthroplasty on 02/11/22 completed by Dr. Stann Mainland. At follow up appointment on 02/24/22, Dr. Stann Mainland encouraged patient to wear her sling another 3 weeks for a total of 5 weeks (03/17/22). Patient arrived to clinic without sling.     PRECAUTIONS: Shoulder At 6 weeks( 03/25/22): AA/ROM, gently increasing  P/ROM, A/ROM when ROM has improved and is close to full. At 12 weeks: begin strengthening   WEIGHT BEARING RESTRICTIONS Yes NWB RUE    SUBJECTIVE: S: I had my nerve conduction study and I go back to see doctor on Friday. I am still getting dizzy when I switch positions. I use my arm for most things, it just gets tired and heavy faster than my left.   PAIN:   Are you having pain? Yes: NPRS scale: 1/10 (at rest) Pain location: Shoulder  Pain description: Dull ache Aggravating factors: movement  Relieving factors:  rest, massage  4/10 pain with activity    OBJECTIVE  HAND DOMINANCE: Right   FUNCTIONAL OUTCOME MEASURES: 38/100 05/05/22: 55/100   UE ROM      Passive ROM -supine IR/er Right 03/08/2022 Right  04/01/22 Right 04/14/22 Right 05/05/22 *measurements taken with pt semi reclined  Shoulder flexion 126 135 159 160  Shoulder abduction 136 180 180 140  Shoulder internal rotation 90 90 90 90  Shoulder external rotation 40 60 65 66  (Blank rows = not tested)   Active ROM- Supine IR/er  Right 04/01/2022 Right 04/14/22 Right 05/05/22  Shoulder flexion 132 140 140  Shoulder abduction 96 160 145  Shoulder internal rotation 90 90 90  Shoulder external rotation 70 76 65  (Blank rows = not tested)    UE MMT:      MMT- Assessed seated during functional task. Right 03/08/2022 Right 04/01/22 Right 04/14/22 Right  05/05/22  Shoulder flexion 3-/5 3-/5 4/5 4/5  Shoulder abduction 3-/5 3-/5 4/5 4/5  Shoulder internal rotation 3/5 3/5 4/5 4+/5  Shoulder external rotation 3-5 3/5 4-/5 4-/5  (Blank rows = not tested)      TODAY'S TREATMENT:  05/05/22 -Manual therapy: soft tissue mobilization and myofascial release to upper quadrant  -Measurements for reassessment  -Shoulder strengthening: 1#, 1x10 shoulder flexion, abduction   05/03/22 -Manual therapy: myofascial release to upper arm and posterior shoulder  -Scapular ROM: 2X10 standing retraction and shoulder extension with 3" hold  and tactile cues for muscle recruitment -Shoulder stretches: flexion wall stretch 4x15" -Proximal Shoulder Strengthening: standing, criss cross, reverse circles, 2x30 seconds each movement   04/14/22 - manual therapy: myofascial release to upper arm, posterior shoulder- completed for increased ROM  - P/ROM: supine all directions 5x each direction  -A/ROM: seated-protraction, flexion, horizontal abduction, er/IR, abduction, 10X each -Shoulder stretches: flexion wall stretch, IR behind back with horizontal towel, 3x10" -Scapular Theraband: red-row, extension, 10X each -Overhead lacing: beginning at top and lacing down, then reversing and removing     PATIENT EDUCATION: Education details: Introduction to strengthening exercises and importance  Person educated: patient Education method: verbal explanation, demonstration Education comprehension: verbalized understanding, returned demonstration     HOME EXERCISE PROGRAM: Eval: table slides; 5/30: AA/ROM 6/2: shoulder stretches on wall: flexion and abduction     GOALS:   SHORT TERM GOALS: Target date: 04/05/2022   Pt will be educated and independent with HEP in order to increase her ability to use her right UE as her dominant extremity for 50% or more of her daily tasks.  Baseline: Goal status: MET   2.  Pt will increase her RUE P/ROM to [facility] in order to increase ability to complete upper body dressing tasks with less difficulty.  Baseline:  Goal status: MET   3.  Pt will increase her RUE strength to 3+/5 in order to complete activities at or below her shoulder level with increased shoulder and scapular stability demonstrated.  Baseline:  Goal status: MET   4.  Pt will decrease her RUE fascial restrictions to moderate amount or less in order to increase the functional mobility needed to complete functional reaching tasks at home with less difficulty.  Baseline:  Goal status: MET   5.  Pt will report a decrease in pain level of  approximately 5/10 when using her RUE for simple light ADL tasks. Baseline:  Goal status: MET     LONG TERM GOALS: Target date: 06/04/2022    Pt will return to using her RUE as her dominant extremity for all daily tasks.   Goal status: ONGOING   2.  Pt will increase her RUE A/ROM to [facility] in order to reach above her shoulder level to retrieve items out of a cabinet.  Goal status: ONGOING    3.  Pt will will increase her RUE strength to 4+/5 in order to manage weight of moderate weight using both arms.  Goal status: ONGOING   4.  Pt will report a decrease in pain level in her right shoulder of approximately 3/10 or less when  using it to complete daily tasks.    Goal status: ONGOING   5.  Pt will decrease her RUE fascial restrictions to minimal amount or less in order to increase the functional mobility needed to complete higher level reaching tasks.   Goal status: ONGOING     ASSESSMENT:   CLINICAL IMPRESSION: A: Manual therapy completed at start of session to address remaining fascial restrictions throughout upper quadrant, most restriction noted at the upper trapezius. Pt reporting some increased numbness and tingling into her right hand with therapist palpating trigger points in that area. Numbness and tingling also present today with Passive and A/ROM, most with external rotation and abduction. Measurements taken today for reassessment. Some decreased ROM measured today, could be due to positioning, pt completing in semi reclined position due to dizziness with positional changes and vertigo. Pt is able to achieve A/ROM equal to P/ROM and pt is ready to begin strengthening at the shoulder.     PERFORMANCE DEFICITS in functional skills including ADLs, IADLs, edema, ROM, strength, pain, fascial restrictions, mobility, decreased knowledge of precautions, and UE functional use. IMPAIRMENTS are limiting patient from ADLs, IADLs, and leisure.      PLAN: OT FREQUENCY: 2x/week   OT  DURATION: 8 weeks   PLANNED INTERVENTIONS: self care/ADL training, therapeutic exercise, therapeutic activity, neuromuscular re-education, manual therapy, passive range of motion, electrical stimulation, ultrasound, moist heat, cryotherapy, patient/family education, coping strategies training, and DME and/or AE instructions       CONSULTED AND AGREED WITH PLAN OF CARE: Patient   PLAN FOR NEXT SESSION: P: Progress to strengthening and update HEP accordingly     Flonnie Hailstone, OTD, OTR/L [phone]  05/05/2022, 4:48 PM

## 2022-05-10 ENCOUNTER — Encounter (HOSPITAL_COMMUNITY): Payer: Self-pay

## 2022-05-10 ENCOUNTER — Ambulatory Visit (HOSPITAL_COMMUNITY): Payer: No Typology Code available for payment source | Attending: Orthopedic Surgery

## 2022-05-10 DIAGNOSIS — R29898 Other symptoms and signs involving the musculoskeletal system: Secondary | ICD-10-CM | POA: Insufficient documentation

## 2022-05-10 DIAGNOSIS — M25511 Pain in right shoulder: Secondary | ICD-10-CM | POA: Insufficient documentation

## 2022-05-10 DIAGNOSIS — M25611 Stiffness of right shoulder, not elsewhere classified: Secondary | ICD-10-CM | POA: Insufficient documentation

## 2022-05-10 NOTE — Therapy (Signed)
OUTPATIENT OCCUPATIONAL THERAPY TREATMENT NOTE (Reassessment)   Patient Name: [name] MRN: [id number] DOB:Jul 22, 1961, 61 y.o., female Today's Date: 05/10/2022  PCP: Sable Feil MD REFERRING PROVIDER: Nicholes Stairs, MD   END OF SESSION:   OT End of Session - 05/10/22 1045     Visit Number 11    Number of Visits 18    Date for OT Re-Evaluation 06/04/22    Authorization Type [employer] [place] Preferred    Authorization Time Period No copay. 90 combined visits (3 visits used). no auth needed    Authorization - Visit Number 12    Authorization - Number of Visits 90    Progress Note Due on Visit 19    OT Start Time 1042    OT Stop Time 1115    OT Time Calculation (min) 33 min    Activity Tolerance Patient tolerated treatment well    Behavior During Therapy WFL for tasks assessed/performed                  Past Medical History:  Diagnosis Date   Anxiety    Arthritis    Breast cancer ([place])    Status post bilateral mastectomy   Breast reconstruction deformity    Complication of anesthesia    shaking-after mastectomies-resp and hr dropped   Depression, major    suicidal thoughts -implants, mental health, 05/11   Fibromyalgia    Hypothyroidism    Migraine headache    Narcolepsy    OSA on CPAP    Sleep apnea with use of continuous positive airway pressure (CPAP)    AHI 20 in HST, titration to 6 cm water - residual AHi 6 . remaining hypersomnic.    Past Surgical History:  Procedure Laterality Date   ARTHRODESIS METATARSALPHALANGEAL JOINT (MTPJ) Right 07/28/2017   Procedure: ARTHRODESIS  RIGHT HALLUX METATARSALPHALANGEAL JOINT (MTPJ);  Surgeon: Wylene Simmer, MD;  Location: [place];  Service: Orthopedics;  Laterality: Right;   Breast reconstructioin  7/09   flaps-bilat mastectomies   BTL     CARPAL TUNNEL RELEASE  2011   both hands   CARPOMETACARPEL SUSPENSION PLASTY Right 08/29/2014   Procedure: REMOVAL DERMASPAN  SUSPENSIONPLASTY RIGHT THUMB ABDUCTOR POLLIS TRANSFER;  Surgeon: Daryll Brod, MD;  Location: [place];  Service: Orthopedics;  Laterality: Right;   CATARACT EXTRACTION W/PHACO Right 12/17/2019   Procedure: CATARACT EXTRACTION PHACO AND INTRAOCULAR LENS PLACEMENT (IOC);  Surgeon: Baruch Goldmann, MD;  [facility];  Service: Ophthalmology;  Laterality: Right;  CDE: 6.54   CATARACT EXTRACTION W/PHACO Left 12/31/2019   Procedure: CATARACT EXTRACTION PHACO AND INTRAOCULAR LENS PLACEMENT (IOC);  Surgeon: Baruch Goldmann, MD;  Location: [facility];  Service: Ophthalmology;  Laterality: Left;  CDE: 3.66   COLONOSCOPY W/ POLYPECTOMY  4/03   tubulovillous adenoma   FINGER ARTHROSCOPY WITH CARPOMETACARPEL ([place]) ARTHROPLASTY Right 06/20/2014   Procedure: ARTHROSCOPY RIGHT THUMB CARPOMETACARPEL GRAFT JACKET INTERPOSITION PARTIAL TRAPEZIECTOMY POSSIBLE OPEN;  Surgeon: Daryll Brod, MD;  Location: [place];  Service: Orthopedics;  Laterality: Right;   GANGLION CYST EXCISION     both [phone]   HAMMER TOE SURGERY Right 07/28/2017   Procedure: THIRD HAMMERTOE CORRECTION;  Surgeon: Wylene Simmer, MD;  Location: [place];  Service: Orthopedics;  Laterality: Right;   HARDWARE REMOVAL Right 07/28/2017   Procedure: REMOVAL OF DEEP IMPLANTS;  Surgeon: Wylene Simmer, MD;  Location: [place];  Service: Orthopedics;  Laterality: Right;   hysterestomy  2005  MASTECTOMY Bilateral 2008   post- a -cath  2008   right knee arthroscopy     TOTAL SHOULDER ARTHROPLASTY Left 01/13/2017   Procedure: LEFT TOTAL SHOULDER ARTHROPLASTY;  Surgeon: Tania Ade, MD;  Location: [place];  Service: Orthopedics;  Laterality: Left;  LEFT TOTAL SHOULDER ARTHROPLASTY   TOTAL SHOULDER ARTHROPLASTY Right 02/11/2022   Procedure: TOTAL SHOULDER ARTHROPLASTY;  Surgeon: Nicholes Stairs, MD;  Location: [place];  Service: Orthopedics;  Laterality: Right;  150   TRIGGER FINGER RELEASE Right  08/29/2014   Procedure: RELEASE TRIGGER FINGER/A-1 PULLEY;  Surgeon: Daryll Brod, MD;  Location: [place];  Service: Orthopedics;  Laterality: Right;   wisdom teeth out     Patient Active Problem List   Diagnosis Date Noted   History of total shoulder replacement, right 02/11/2022   Osteoarthritis of left shoulder 01/13/2017   S/P shoulder replacement, left 01/13/2017   OSA on CPAP 04/01/2015   Subacute confusional state 04/01/2015   Hallucinations, visual 04/01/2015   Hypnapompic hallucinations 04/01/2015   Sleep apnea with use of continuous positive airway pressure (CPAP)    Hypersomnia, persistent 03/30/2013   Obstructive sleep apnea (adult) (pediatric) 03/30/2013   CONSTIPATION 11/18/2008   RECTAL BLEEDING 11/18/2008   HEMORRHOIDS, INTERNAL 11/15/2008   DIVERTICULOSIS, COLON 11/15/2008   CELLULITIS/ABSCESS, TRUNK 02/22/2008   LOW BACK PAIN 02/22/2008   DIZZINESS 02/22/2008   FATIGUE 02/22/2008   PALPITATIONS 02/22/2008   MURMUR, CARDIAC, UNDIAGNOSED 02/22/2008   BREAST CANCER, HX OF 02/22/2008   COUGH 01/05/2008   DEPRESSION 07/05/2007   UTERINE POLYP 07/05/2007   FIBROMYALGIA 07/05/2007   COLONIC POLYPS, HX OF 07/05/2007   HX, PERSONAL, CERVICAL DYSPLASIA 07/05/2007   ONSET DATE: 02/11/22   REFERRING DIAG: S/P right total shoulder arthroplasty   THERAPY DIAG:  Other symptoms and signs involving the musculoskeletal system   Acute pain of right shoulder   Stiffness of right shoulder, not elsewhere classified  Rationale for Evaluation and Treatment Rehabilitation    PERTINENT HISTORY: Patient with OA of the right shoulder underwent a right total shoulder arthroplasty on 02/11/22 completed by Dr. Stann Mainland. At follow up appointment on 02/24/22, Dr. Stann Mainland encouraged patient to wear her sling another 3 weeks for a total of 5 weeks (03/17/22). Patient arrived to clinic without sling.     PRECAUTIONS: Shoulder At 6 weeks( 03/25/22): AA/ROM, gently increasing  P/ROM, A/ROM when ROM has improved and is close to full. At 12 weeks: begin strengthening   WEIGHT BEARING RESTRICTIONS Yes NWB RUE    SUBJECTIVE: S: The doctor said I have carpal tunnel and cubital tunnel syndrome.   Pt arrived 12 minutes late to session   PAIN:   Are you having pain? Yes: NPRS scale: 2/10 (at rest) Pain location: Shoulder  Pain description: Dull ache Aggravating factors: movement  Relieving factors:  rest, massage  4/10 pain with activity    OBJECTIVE  HAND DOMINANCE: Right   FUNCTIONAL OUTCOME MEASURES: 38/100 05/05/22: 55/100   UE ROM      Passive ROM -supine IR/er Right 03/08/2022 Right  04/01/22 Right 04/14/22 Right 05/05/22 *measurements taken with pt semi reclined  Shoulder flexion 126 135 159 160  Shoulder abduction 136 180 180 140  Shoulder internal rotation 90 90 90 90  Shoulder external rotation 40 60 65 66  (Blank rows = not tested)   Active ROM- Supine IR/er Right 04/01/2022 Right 04/14/22 Right 05/05/22  Shoulder flexion 132 140 140  Shoulder abduction 96 160 145  Shoulder internal  rotation 90 90 90  Shoulder external rotation 70 76 65  (Blank rows = not tested)    UE MMT:      MMT- Assessed seated during functional task. Right 03/08/2022 Right 04/01/22 Right 04/14/22 Right  05/05/22  Shoulder flexion 3-/5 3-/5 4/5 4/5  Shoulder abduction 3-/5 3-/5 4/5 4/5  Shoulder internal rotation 3/5 3/5 4/5 4+/5  Shoulder external rotation 3-5 3/5 4-/5 4-/5  (Blank rows = not tested)      TODAY'S TREATMENT: 05/10/22 -Manual therapy: soft tissue mobilization and myofascial release to upper trapezius -Shoulder Stretch: shoulder flexion 2x30 seconds -Shoulder strengthening: Functional reach, forward flexion, with 1 lb wrist weight to move items from middle shelf to counter, bottom to middle, and counter to bottom.  -Shoulder strengthening: Functional reach, abduction, 1 lb wrist weight to remove items from bottom shelf, place on counter, and  return to bottom shelf.   05/05/22 -Manual therapy: soft tissue mobilization and myofascial release to upper quadrant  -Measurements for reassessment  -Shoulder strengthening: 1#, 1x10 shoulder flexion, abduction   05/03/22 -Manual therapy: myofascial release to upper arm and posterior shoulder  -Scapular ROM: 2X10 standing retraction and shoulder extension with 3" hold and tactile cues for muscle recruitment -Shoulder stretches: flexion wall stretch 4x15" -Proximal Shoulder Strengthening: standing, criss cross, reverse circles, 2x30 seconds each movement    PATIENT EDUCATION: Education details: Discussed wrist and elbow bracing.  Person educated: patient Education method: verbal explanation, demonstration Education comprehension: verbalized understanding, returned demonstration     HOME EXERCISE PROGRAM: Eval: table slides; 5/30: AA/ROM 6/2: shoulder stretches on wall: flexion and abduction      GOALS:   SHORT TERM GOALS: Target date: 04/05/2022   Pt will be educated and independent with HEP in order to increase her ability to use her right UE as her dominant extremity for 50% or more of her daily tasks.  Baseline: Goal status: MET   2.  Pt will increase her RUE P/ROM to [facility] in order to increase ability to complete upper body dressing tasks with less difficulty.  Baseline:  Goal status: MET   3.  Pt will increase her RUE strength to 3+/5 in order to complete activities at or below her shoulder level with increased shoulder and scapular stability demonstrated.  Baseline:  Goal status: MET   4.  Pt will decrease her RUE fascial restrictions to moderate amount or less in order to increase the functional mobility needed to complete functional reaching tasks at home with less difficulty.  Baseline:  Goal status: MET   5.  Pt will report a decrease in pain level of approximately 5/10 when using her RUE for simple light ADL tasks. Baseline:  Goal status: MET     LONG TERM  GOALS: Target date: 06/04/2022    Pt will return to using her RUE as her dominant extremity for all daily tasks.   Goal status: ONGOING   2.  Pt will increase her RUE A/ROM to [facility] in order to reach above her shoulder level to retrieve items out of a cabinet.  Goal status: ONGOING    3.  Pt will will increase her RUE strength to 4+/5 in order to manage weight of moderate weight using both arms.  Goal status: ONGOING   4.  Pt will report a decrease in pain level in her right shoulder of approximately 3/10 or less when using it to complete daily tasks.    Goal status: ONGOING   5.  Pt will decrease  her RUE fascial restrictions to minimal amount or less in order to increase the functional mobility needed to complete higher level reaching tasks.   Goal status: ONGOING     ASSESSMENT:   CLINICAL IMPRESSION: A: Manual therapy focused on tight muscle fibers found in the upper trapezius and medial border of scapula with pt noting relief. Functional ROM noted with shoulder movements, able to achieve 180 degrees with wall stretch. Progressed to shoulder strengthening and endurance today with weighted functional reach with shoulder forward flexion and abduction. Pt tolerated well, reporting no increased pain but rather shoulder fatigue.       PERFORMANCE DEFICITS in functional skills including ADLs, IADLs, edema, ROM, strength, pain, fascial restrictions, mobility, decreased knowledge of precautions, and UE functional use. IMPAIRMENTS are limiting patient from ADLs, IADLs, and leisure.      PLAN: OT FREQUENCY: 2x/week   OT DURATION: 8 weeks   PLANNED INTERVENTIONS: self care/ADL training, therapeutic exercise, therapeutic activity, neuromuscular re-education, manual therapy, passive range of motion, electrical stimulation, ultrasound, moist heat, cryotherapy, patient/family education, coping strategies training, and DME and/or AE instructions       CONSULTED AND AGREED WITH PLAN OF  CARE: Patient   PLAN FOR NEXT SESSION: P: HEP UPDATE--strengthening     Flonnie Hailstone, [place], OTR/L [phone]  05/10/2022, 12:07 PM

## 2022-05-12 ENCOUNTER — Encounter (HOSPITAL_COMMUNITY): Payer: Self-pay

## 2022-05-12 ENCOUNTER — Ambulatory Visit (HOSPITAL_COMMUNITY): Payer: No Typology Code available for payment source

## 2022-05-12 DIAGNOSIS — R29898 Other symptoms and signs involving the musculoskeletal system: Secondary | ICD-10-CM | POA: Diagnosis not present

## 2022-05-12 DIAGNOSIS — M25511 Pain in right shoulder: Secondary | ICD-10-CM

## 2022-05-12 DIAGNOSIS — M25611 Stiffness of right shoulder, not elsewhere classified: Secondary | ICD-10-CM

## 2022-05-12 NOTE — Therapy (Signed)
OUTPATIENT OCCUPATIONAL THERAPY TREATMENT NOTE (Reassessment)   Patient Name: [name] MRN: [id number] DOB:Aug 13, 1961, 61 y.o., female Today's Date: 05/12/2022  PCP: Sable Feil MD REFERRING PROVIDER: Nicholes Stairs, MD   END OF SESSION:   OT End of Session - 05/12/22 0955     Visit Number 12    Number of Visits 18    Date for OT Re-Evaluation 06/04/22    Authorization Type Aetna [place] Preferred    Authorization Time Period No copay. 90 combined visits (3 visits used). no auth needed    Authorization - Visit Number 13    Authorization - Number of Visits 90    Progress Note Due on Visit 61    OT Start Time 0954    OT Stop Time 1020    OT Time Calculation (min) 26 min    Activity Tolerance Patient tolerated treatment well    Behavior During Therapy WFL for tasks assessed/performed                  Past Medical History:  Diagnosis Date   Anxiety    Arthritis    Breast cancer ([place])    Status post bilateral mastectomy   Breast reconstruction deformity    Complication of anesthesia    shaking-after mastectomies-resp and hr dropped   Depression, major    suicidal thoughts -implants, mental health, 05/11   Fibromyalgia    Hypothyroidism    Migraine headache    Narcolepsy    OSA on CPAP    Sleep apnea with use of continuous positive airway pressure (CPAP)    AHI 20 in HST, titration to 6 cm water - residual AHi 6 . remaining hypersomnic.    Past Surgical History:  Procedure Laterality Date   ARTHRODESIS METATARSALPHALANGEAL JOINT (MTPJ) Right 07/28/2017   Procedure: ARTHRODESIS  RIGHT HALLUX METATARSALPHALANGEAL JOINT (MTPJ);  Surgeon: Wylene Simmer, MD;  Location: [place];  Service: Orthopedics;  Laterality: Right;   Breast reconstructioin  7/09   flaps-bilat mastectomies   BTL     CARPAL TUNNEL RELEASE  2011   both hands   CARPOMETACARPEL SUSPENSION PLASTY Right 08/29/2014   Procedure: REMOVAL DERMASPAN  SUSPENSIONPLASTY RIGHT THUMB ABDUCTOR POLLIS TRANSFER;  Surgeon: Daryll Brod, MD;  Location: [place];  Service: Orthopedics;  Laterality: Right;   CATARACT EXTRACTION W/PHACO Right 12/17/2019   Procedure: CATARACT EXTRACTION PHACO AND INTRAOCULAR LENS PLACEMENT (IOC);  Surgeon: Baruch Goldmann, MD;  Location: [facility];  Service: Ophthalmology;  Laterality: Right;  CDE: 6.54   CATARACT EXTRACTION W/PHACO Left 12/31/2019   Procedure: CATARACT EXTRACTION PHACO AND INTRAOCULAR LENS PLACEMENT (IOC);  Surgeon: Baruch Goldmann, MD;  Location: [facility];  Service: Ophthalmology;  Laterality: Left;  CDE: 3.66   COLONOSCOPY W/ POLYPECTOMY  4/03   tubulovillous adenoma   FINGER ARTHROSCOPY WITH CARPOMETACARPEL ([place]) ARTHROPLASTY Right 06/20/2014   Procedure: ARTHROSCOPY RIGHT THUMB CARPOMETACARPEL GRAFT JACKET INTERPOSITION PARTIAL TRAPEZIECTOMY POSSIBLE OPEN;  Surgeon: Daryll Brod, MD;  Location: [place];  Service: Orthopedics;  Laterality: Right;   GANGLION CYST EXCISION     both [phone]   HAMMER TOE SURGERY Right 07/28/2017   Procedure: THIRD HAMMERTOE CORRECTION;  Surgeon: Wylene Simmer, MD;  Location: [place];  Service: Orthopedics;  Laterality: Right;   HARDWARE REMOVAL Right 07/28/2017   Procedure: REMOVAL OF DEEP IMPLANTS;  Surgeon: Wylene Simmer, MD;  Location: [place];  Service: Orthopedics;  Laterality: Right;   hysterestomy  2005  MASTECTOMY Bilateral 2008   post- a -cath  2008   right knee arthroscopy     TOTAL SHOULDER ARTHROPLASTY Left 01/13/2017   Procedure: LEFT TOTAL SHOULDER ARTHROPLASTY;  Surgeon: Tania Ade, MD;  Location: [place];  Service: Orthopedics;  Laterality: Left;  LEFT TOTAL SHOULDER ARTHROPLASTY   TOTAL SHOULDER ARTHROPLASTY Right 02/11/2022   Procedure: TOTAL SHOULDER ARTHROPLASTY;  Surgeon: Nicholes Stairs, MD;  Location: [place];  Service: Orthopedics;  Laterality: Right;  150   TRIGGER FINGER RELEASE Right  08/29/2014   Procedure: RELEASE TRIGGER FINGER/A-1 PULLEY;  Surgeon: Daryll Brod, MD;  Location: [place];  Service: Orthopedics;  Laterality: Right;   wisdom teeth out     Patient Active Problem List   Diagnosis Date Noted   History of total shoulder replacement, right 02/11/2022   Osteoarthritis of left shoulder 01/13/2017   S/P shoulder replacement, left 01/13/2017   OSA on CPAP 04/01/2015   Subacute confusional state 04/01/2015   Hallucinations, visual 04/01/2015   Hypnapompic hallucinations 04/01/2015   Sleep apnea with use of continuous positive airway pressure (CPAP)    Hypersomnia, persistent 03/30/2013   Obstructive sleep apnea (adult) (pediatric) 03/30/2013   CONSTIPATION 11/18/2008   RECTAL BLEEDING 11/18/2008   HEMORRHOIDS, INTERNAL 11/15/2008   DIVERTICULOSIS, COLON 11/15/2008   CELLULITIS/ABSCESS, TRUNK 02/22/2008   LOW BACK PAIN 02/22/2008   DIZZINESS 02/22/2008   FATIGUE 02/22/2008   PALPITATIONS 02/22/2008   MURMUR, CARDIAC, UNDIAGNOSED 02/22/2008   BREAST CANCER, HX OF 02/22/2008   COUGH 01/05/2008   DEPRESSION 07/05/2007   UTERINE POLYP 07/05/2007   FIBROMYALGIA 07/05/2007   COLONIC POLYPS, HX OF 07/05/2007   HX, PERSONAL, CERVICAL DYSPLASIA 07/05/2007   ONSET DATE: 02/11/22   REFERRING DIAG: S/P right total shoulder arthroplasty   THERAPY DIAG:  Other symptoms and signs involving the musculoskeletal system   Acute pain of right shoulder   Stiffness of right shoulder, not elsewhere classified  Rationale for Evaluation and Treatment Rehabilitation    PERTINENT HISTORY: Patient with OA of the right shoulder underwent a right total shoulder arthroplasty on 02/11/22 completed by Dr. Stann Mainland. At follow up appointment on 02/24/22, Dr. Stann Mainland encouraged patient to wear her sling another 3 weeks for a total of 5 weeks (03/17/22). Patient arrived to clinic without sling.     PRECAUTIONS: Shoulder At 6 weeks( 03/25/22): AA/ROM, gently increasing  P/ROM, A/ROM when ROM has improved and is close to full. At 12 weeks: begin strengthening   WEIGHT BEARING RESTRICTIONS Yes NWB RUE    SUBJECTIVE: S: I raked leaves yesterday and now it is sore.   Pt arrived 10 minutes late to session and left 10 minutes early for another appointment   PAIN:   Are you having pain? Yes: NPRS scale: 2/10 (at rest) Pain location: Shoulder  Pain description: Dull ache Aggravating factors: movement  Relieving factors:  rest, massage  4/10 pain with activity    OBJECTIVE  HAND DOMINANCE: Right   FUNCTIONAL OUTCOME MEASURES: 38/100 05/05/22: 55/100   UE ROM      Passive ROM -supine IR/er Right 03/08/2022 Right  04/01/22 Right 04/14/22 Right 05/05/22 *measurements taken with pt semi reclined  Shoulder flexion 126 135 159 160  Shoulder abduction 136 180 180 140  Shoulder internal rotation 90 90 90 90  Shoulder external rotation 40 60 65 66  (Blank rows = not tested)   Active ROM- Supine IR/er Right 04/01/2022 Right 04/14/22 Right 05/05/22  Shoulder flexion 132 140 140  Shoulder abduction  96 160 145  Shoulder internal rotation 90 90 90  Shoulder external rotation 70 76 65  (Blank rows = not tested)    UE MMT:      MMT- Assessed seated during functional task. Right 03/08/2022 Right 04/01/22 Right 04/14/22 Right  05/05/22  Shoulder flexion 3-/5 3-/5 4/5 4/5  Shoulder abduction 3-/5 3-/5 4/5 4/5  Shoulder internal rotation 3/5 3/5 4/5 4+/5  Shoulder external rotation 3-5 3/5 4-/5 4-/5  (Blank rows = not tested)      TODAY'S TREATMENT:  05/12/22 -P/ROM: seated, 1x5 shoulder flexion, shoulder abduction  -A/ROM: seated, 1x8 shoulder flexion, abduction, horizontal abduction -Manual therapy: upper trapezius, length of biceps  05/10/22 -Manual therapy: soft tissue mobilization and myofascial release to upper trapezius -Shoulder Stretch: shoulder flexion 2x30 seconds -Shoulder strengthening: Functional reach, forward flexion, with 1 lb wrist  weight to move items from middle shelf to counter, bottom to middle, and counter to bottom.  -Shoulder strengthening: Functional reach, abduction, 1 lb wrist weight to remove items from bottom shelf, place on counter, and return to bottom shelf.   05/05/22 -Manual therapy: soft tissue mobilization and myofascial release to upper quadrant  -Measurements for reassessment  -Shoulder strengthening: 1#, 1x10 shoulder flexion, abduction      PATIENT EDUCATION: Education details: Discussed wrist and elbow bracing, ice for swelling and soreness, theraband scapular strengthening  Person educated: patient Education method: verbal explanation, demonstration Education comprehension: verbalized understanding, returned demonstration     HOME EXERCISE PROGRAM: Eval: table slides; 5/30: AA/ROM 6/2: shoulder stretches on wall: flexion and abduction  05/12/22: theraband scapular strengthening     GOALS:   SHORT TERM GOALS: Target date: 04/05/2022   Pt will be educated and independent with HEP in order to increase her ability to use her right UE as her dominant extremity for 50% or more of her daily tasks.  Baseline: Goal status: MET   2.  Pt will increase her RUE P/ROM to [facility] in order to increase ability to complete upper body dressing tasks with less difficulty.  Baseline:  Goal status: MET   3.  Pt will increase her RUE strength to 3+/5 in order to complete activities at or below her shoulder level with increased shoulder and scapular stability demonstrated.  Baseline:  Goal status: MET   4.  Pt will decrease her RUE fascial restrictions to moderate amount or less in order to increase the functional mobility needed to complete functional reaching tasks at home with less difficulty.  Baseline:  Goal status: MET   5.  Pt will report a decrease in pain level of approximately 5/10 when using her RUE for simple light ADL tasks. Baseline:  Goal status: MET     LONG TERM GOALS: Target date:  06/04/2022    Pt will return to using her RUE as her dominant extremity for all daily tasks.   Goal status: ONGOING   2.  Pt will increase her RUE A/ROM to [facility] in order to reach above her shoulder level to retrieve items out of a cabinet.  Goal status: ONGOING    3.  Pt will will increase her RUE strength to 4+/5 in order to manage weight of moderate weight using both arms.  Goal status: ONGOING   4.  Pt will report a decrease in pain level in her right shoulder of approximately 3/10 or less when using it to complete daily tasks.    Goal status: ONGOING   5.  Pt will decrease her RUE fascial  restrictions to minimal amount or less in order to increase the functional mobility needed to complete higher level reaching tasks.   Goal status: ONGOING     ASSESSMENT:   CLINICAL IMPRESSION: A: Began session with P/ROM, some restrictions noted with some slight muscle spasms. Addressed restrictions with manual therapy, emphasis on biceps, trapezius, and deltoid, pt reporting some relief and able to progress to A/ROM with less restriction and muscle spasm although reporting fatigue with repetitions, less ROM achieved with final repetitions. Significant tightness noted at the proximal biceps and at insertion site likely due to increase use with raking yesterday.      PERFORMANCE DEFICITS in functional skills including ADLs, IADLs, edema, ROM, strength, pain, fascial restrictions, mobility, decreased knowledge of precautions, and UE functional use. IMPAIRMENTS are limiting patient from ADLs, IADLs, and leisure.      PLAN: OT FREQUENCY: 2x/week   OT DURATION: 8 weeks   PLANNED INTERVENTIONS: self care/ADL training, therapeutic exercise, therapeutic activity, neuromuscular re-education, manual therapy, passive range of motion, electrical stimulation, ultrasound, moist heat, cryotherapy, patient/family education, coping strategies training, and DME and/or AE instructions       CONSULTED  AND AGREED WITH PLAN OF CARE: Patient   PLAN FOR NEXT SESSION: P: Strengthening, handout for A/ROM    Flonnie Hailstone, [place], OTR/L [phone]  05/12/2022, 10:27 AM

## 2022-05-12 NOTE — Patient Instructions (Signed)

## 2022-05-17 ENCOUNTER — Ambulatory Visit (HOSPITAL_COMMUNITY): Payer: No Typology Code available for payment source

## 2022-05-17 ENCOUNTER — Encounter (HOSPITAL_COMMUNITY): Payer: Self-pay

## 2022-05-17 DIAGNOSIS — M25611 Stiffness of right shoulder, not elsewhere classified: Secondary | ICD-10-CM

## 2022-05-17 DIAGNOSIS — R29898 Other symptoms and signs involving the musculoskeletal system: Secondary | ICD-10-CM

## 2022-05-17 DIAGNOSIS — M25511 Pain in right shoulder: Secondary | ICD-10-CM

## 2022-05-17 NOTE — Therapy (Signed)
OUTPATIENT OCCUPATIONAL THERAPY TREATMENT NOTE    Patient Name: [name] MRN: [id number] DOB:03/13/1961, 61 y.o., female Today's Date: 05/17/2022  PCP: Sable Feil MD REFERRING PROVIDER: Nicholes Stairs, MD   END OF SESSION:   OT End of Session - 05/17/22 0957     Visit Number 13    Number of Visits 18    Date for OT Re-Evaluation 06/04/22    Authorization Type Aetna [place] Preferred    Authorization Time Period No copay. 90 combined visits (3 visits used). no auth needed    Authorization - Visit Number 14    Authorization - Number of Visits 90    Progress Note Due on Visit 83    OT Start Time 0955    OT Stop Time 1030    OT Time Calculation (min) 35 min    Activity Tolerance Patient tolerated treatment well    Behavior During Therapy WFL for tasks assessed/performed                  Past Medical History:  Diagnosis Date   Anxiety    Arthritis    Breast cancer ([place])    Status post bilateral mastectomy   Breast reconstruction deformity    Complication of anesthesia    shaking-after mastectomies-resp and hr dropped   Depression, major    suicidal thoughts -implants, mental health, 05/11   Fibromyalgia    Hypothyroidism    Migraine headache    Narcolepsy    OSA on CPAP    Sleep apnea with use of continuous positive airway pressure (CPAP)    AHI 20 in HST, titration to 6 cm water - residual AHi 6 . remaining hypersomnic.    Past Surgical History:  Procedure Laterality Date   ARTHRODESIS METATARSALPHALANGEAL JOINT (MTPJ) Right 07/28/2017   Procedure: ARTHRODESIS  RIGHT HALLUX METATARSALPHALANGEAL JOINT (MTPJ);  Surgeon: Wylene Simmer, MD;  Location: [place];  Service: Orthopedics;  Laterality: Right;   Breast reconstructioin  7/09   flaps-bilat mastectomies   BTL     CARPAL TUNNEL RELEASE  2011   both hands   CARPOMETACARPEL SUSPENSION PLASTY Right 08/29/2014   Procedure: REMOVAL DERMASPAN SUSPENSIONPLASTY RIGHT  THUMB ABDUCTOR POLLIS TRANSFER;  Surgeon: Daryll Brod, MD;  Location: [place];  Service: Orthopedics;  Laterality: Right;   CATARACT EXTRACTION W/PHACO Right 12/17/2019   Procedure: CATARACT EXTRACTION PHACO AND INTRAOCULAR LENS PLACEMENT (IOC);  Surgeon: Baruch Goldmann, MD;  [facility];  Service: Ophthalmology;  Laterality: Right;  CDE: 6.54   CATARACT EXTRACTION W/PHACO Left 12/31/2019   Procedure: CATARACT EXTRACTION PHACO AND INTRAOCULAR LENS PLACEMENT (IOC);  Surgeon: Baruch Goldmann, MD;  [facility];  Service: Ophthalmology;  Laterality: Left;  CDE: 3.66   COLONOSCOPY W/ POLYPECTOMY  4/03   tubulovillous adenoma   FINGER ARTHROSCOPY WITH CARPOMETACARPEL ([place]) ARTHROPLASTY Right 06/20/2014   Procedure: ARTHROSCOPY RIGHT THUMB CARPOMETACARPEL GRAFT JACKET INTERPOSITION PARTIAL TRAPEZIECTOMY POSSIBLE OPEN;  Surgeon: Daryll Brod, MD;  Location: [place];  Service: Orthopedics;  Laterality: Right;   GANGLION CYST EXCISION     both [phone]   HAMMER TOE SURGERY Right 07/28/2017   Procedure: THIRD HAMMERTOE CORRECTION;  Surgeon: Wylene Simmer, MD;  Location: [place];  Service: Orthopedics;  Laterality: Right;   HARDWARE REMOVAL Right 07/28/2017   Procedure: REMOVAL OF DEEP IMPLANTS;  Surgeon: Wylene Simmer, MD;  Location: [place];  Service: Orthopedics;  Laterality: Right;   hysterestomy  2005  MASTECTOMY Bilateral 2008   post- a -cath  2008   right knee arthroscopy     TOTAL SHOULDER ARTHROPLASTY Left 01/13/2017   Procedure: LEFT TOTAL SHOULDER ARTHROPLASTY;  Surgeon: Tania Ade, MD;  Location: [place];  Service: Orthopedics;  Laterality: Left;  LEFT TOTAL SHOULDER ARTHROPLASTY   TOTAL SHOULDER ARTHROPLASTY Right 02/11/2022   Procedure: TOTAL SHOULDER ARTHROPLASTY;  Surgeon: Nicholes Stairs, MD;  Location: [place];  Service: Orthopedics;  Laterality: Right;  150   TRIGGER FINGER RELEASE Right 08/29/2014   Procedure:  RELEASE TRIGGER FINGER/A-1 PULLEY;  Surgeon: Daryll Brod, MD;  Location: [place];  Service: Orthopedics;  Laterality: Right;   wisdom teeth out     Patient Active Problem List   Diagnosis Date Noted   History of total shoulder replacement, right 02/11/2022   Osteoarthritis of left shoulder 01/13/2017   S/P shoulder replacement, left 01/13/2017   OSA on CPAP 04/01/2015   Subacute confusional state 04/01/2015   Hallucinations, visual 04/01/2015   Hypnapompic hallucinations 04/01/2015   Sleep apnea with use of continuous positive airway pressure (CPAP)    Hypersomnia, persistent 03/30/2013   Obstructive sleep apnea (adult) (pediatric) 03/30/2013   CONSTIPATION 11/18/2008   RECTAL BLEEDING 11/18/2008   HEMORRHOIDS, INTERNAL 11/15/2008   DIVERTICULOSIS, COLON 11/15/2008   CELLULITIS/ABSCESS, TRUNK 02/22/2008   LOW BACK PAIN 02/22/2008   DIZZINESS 02/22/2008   FATIGUE 02/22/2008   PALPITATIONS 02/22/2008   MURMUR, CARDIAC, UNDIAGNOSED 02/22/2008   BREAST CANCER, HX OF 02/22/2008   COUGH 01/05/2008   DEPRESSION 07/05/2007   UTERINE POLYP 07/05/2007   FIBROMYALGIA 07/05/2007   COLONIC POLYPS, HX OF 07/05/2007   HX, PERSONAL, CERVICAL DYSPLASIA 07/05/2007   ONSET DATE: 02/11/22   REFERRING DIAG: S/P right total shoulder arthroplasty   THERAPY DIAG:  Other symptoms and signs involving the musculoskeletal system   Acute pain of right shoulder   Stiffness of right shoulder, not elsewhere classified  Rationale for Evaluation and Treatment Rehabilitation    PERTINENT HISTORY: Patient with OA of the right shoulder underwent a right total shoulder arthroplasty on 02/11/22 completed by Dr. Stann Mainland. At follow up appointment on 02/24/22, Dr. Stann Mainland encouraged patient to wear her sling another 3 weeks for a total of 5 weeks (03/17/22). Patient arrived to clinic without sling.     PRECAUTIONS: Shoulder At 6 weeks( 03/25/22): AA/ROM, gently increasing P/ROM, A/ROM when ROM has  improved and is close to full. At 12 weeks: begin strengthening   WEIGHT BEARING RESTRICTIONS Yes NWB RUE    SUBJECTIVE: S: My shoulder feels pretty good. Just really tight in my upper shoulder. I can reach heavy shampoo and conditioner on the top shelf.   Pt arrived 10 minutes late to session   PAIN:   Are you having pain? Yes: NPRS scale: 2/10 (at rest) Pain location: Shoulder  Pain description: Dull ache Aggravating factors: movement  Relieving factors:  rest, massage  4/10 pain with activity    OBJECTIVE  HAND DOMINANCE: Right   FUNCTIONAL OUTCOME MEASURES: 38/100 05/05/22: 55/100   UE ROM      Passive ROM -supine IR/er Right 03/08/2022 Right  04/01/22 Right 04/14/22 Right 05/05/22 *measurements taken with pt semi reclined  Shoulder flexion 126 135 159 160  Shoulder abduction 136 180 180 140  Shoulder internal rotation 90 90 90 90  Shoulder external rotation 40 60 65 66  (Blank rows = not tested)   Active ROM- Supine IR/er Right 04/01/2022 Right 04/14/22 Right 05/05/22  Shoulder flexion  132 140 140  Shoulder abduction 96 160 145  Shoulder internal rotation 90 90 90  Shoulder external rotation 70 76 65  (Blank rows = not tested)    UE MMT:      MMT- Assessed seated during functional task. Right 03/08/2022 Right 04/01/22 Right 04/14/22 Right  05/05/22  Shoulder flexion 3-/5 3-/5 4/5 4/5  Shoulder abduction 3-/5 3-/5 4/5 4/5  Shoulder internal rotation 3/5 3/5 4/5 4+/5  Shoulder external rotation 3-5 3/5 4-/5 4-/5  (Blank rows = not tested)      TODAY'S TREATMENT:  05/17/22 -Soft tissue mobilization: tennis ball positioned on wall mobilizing posterior shoulder and trapezius -P/ROM: seated, 1x5 shoulder flexion, shoulder abduction, external rotation  -Strengthening: 1x10, 1#, seated shoulder flexion, bicep curl to overhead press, standing shoulder abduction, external rotation -Proximal shoulder strengthening: 30 seconds each movement, paddles, criss cross, CW  circles, CCW circes  05/12/22 -P/ROM: seated, 1x5 shoulder flexion, shoulder abduction  -A/ROM: seated, 1x8 shoulder flexion, abduction, horizontal abduction -Manual therapy: upper trapezius, length of biceps  05/10/22 -Manual therapy: soft tissue mobilization and myofascial release to upper trapezius -Shoulder Stretch: shoulder flexion 2x30 seconds -Shoulder strengthening: Functional reach, forward flexion, with 1 lb wrist weight to move items from middle shelf to counter, bottom to middle, and counter to bottom.  -Shoulder strengthening: Functional reach, abduction, 1 lb wrist weight to remove items from bottom shelf, place on counter, and return to bottom shelf.     PATIENT EDUCATION: Education details: Discussed wrist and elbow bracing, ice for swelling and soreness, theraband scapular strengthening  Person educated: patient Education method: verbal explanation, demonstration Education comprehension: verbalized understanding, returned demonstration     HOME EXERCISE PROGRAM: Eval: table slides; 5/30: AA/ROM 6/2: shoulder stretches on wall: flexion and abduction  05/12/22: theraband scapular strengthening 7/10:      GOALS:   SHORT TERM GOALS: Target date: 04/05/2022   Pt will be educated and independent with HEP in order to increase her ability to use her right UE as her dominant extremity for 50% or more of her daily tasks.  Baseline: Goal status: MET   2.  Pt will increase her RUE P/ROM to [facility] in order to increase ability to complete upper body dressing tasks with less difficulty.  Baseline:  Goal status: MET   3.  Pt will increase her RUE strength to 3+/5 in order to complete activities at or below her shoulder level with increased shoulder and scapular stability demonstrated.  Baseline:  Goal status: MET   4.  Pt will decrease her RUE fascial restrictions to moderate amount or less in order to increase the functional mobility needed to complete functional reaching tasks  at home with less difficulty.  Baseline:  Goal status: MET   5.  Pt will report a decrease in pain level of approximately 5/10 when using her RUE for simple light ADL tasks. Baseline:  Goal status: MET     LONG TERM GOALS: Target date: 06/04/2022    Pt will return to using her RUE as her dominant extremity for all daily tasks.   Goal status: ONGOING   2.  Pt will increase her RUE A/ROM to [facility] in order to reach above her shoulder level to retrieve items out of a cabinet.  Goal status: ONGOING    3.  Pt will will increase her RUE strength to 4+/5 in order to manage weight of moderate weight using both arms.  Goal status: ONGOING   4.  Pt will report a  decrease in pain level in her right shoulder of approximately 3/10 or less when using it to complete daily tasks.    Goal status: ONGOING   5.  Pt will decrease her RUE fascial restrictions to minimal amount or less in order to increase the functional mobility needed to complete higher level reaching tasks.   Goal status: ONGOING     ASSESSMENT:   CLINICAL IMPRESSION: A: Pt reporting increased tightness and discomfort at the upper trapezius and shoulder blade, demonstrated and guided pt in use of tennis ball to provide self mobilization to address tightness at home throughout the week. Progressed strengthening today with 1# weight, therapist cuing for proper form with initial repetitions. 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 Improvements noted with PROM today, no muscle spasms and about to achieve close to normal limits with to increase in pain. Encouraged pt to continue to complete HEP especially stretching and strengthening.     PERFORMANCE DEFICITS in functional skills including ADLs, IADLs, edema, ROM, strength, pain, fascial restrictions, mobility, decreased knowledge of precautions, and UE functional use. IMPAIRMENTS are limiting patient from ADLs, IADLs, and leisure.       PLAN: OT FREQUENCY: 2x/week   OT DURATION: 8 weeks   PLANNED INTERVENTIONS: self care/ADL training, therapeutic exercise, therapeutic activity, neuromuscular re-education, manual therapy, passive range of motion, electrical stimulation, ultrasound, moist heat, cryotherapy, patient/family education, coping strategies training, and DME and/or AE instructions       CONSULTED AND AGREED WITH PLAN OF CARE: Patient   PLAN FOR NEXT SESSION: P: Strengthening, handout for A/ROM    Mathews Robinsons, OTR/L [phone]  05/17/2022, 12:34 PM

## 2022-05-19 ENCOUNTER — Encounter (HOSPITAL_COMMUNITY): Payer: Self-pay | Admitting: Occupational Therapy

## 2022-05-19 ENCOUNTER — Ambulatory Visit (HOSPITAL_COMMUNITY): Payer: No Typology Code available for payment source | Admitting: Occupational Therapy

## 2022-05-19 DIAGNOSIS — M25611 Stiffness of right shoulder, not elsewhere classified: Secondary | ICD-10-CM

## 2022-05-19 DIAGNOSIS — R29898 Other symptoms and signs involving the musculoskeletal system: Secondary | ICD-10-CM

## 2022-05-19 DIAGNOSIS — M25511 Pain in right shoulder: Secondary | ICD-10-CM

## 2022-05-19 NOTE — Patient Instructions (Signed)
Repeat all exercises 10-15 times, 1-2 times per day. *Can use a soup can or a water bottle if you don't have a low weight at home.*  1) Shoulder Protraction    Begin with elbows by your side, slowly "punch" straight out in front of you.      2) Shoulder Flexion  Supine:     Standing:         Begin with arms at your side with thumbs pointed up, slowly raise both arms up and forward towards overhead.               3) Horizontal abduction/adduction  Supine:   Standing:           Begin with arms straight out in front of you, bring out to the side in at "T" shape. Keep arms straight entire time.                 4) Internal & External Rotation   Supine:     Standing:     Stand with elbows at the side and elbows bent 90 degrees. Move your forearms away from your body, then bring back inward toward the body.     5) Shoulder Abduction  Supine:     Standing:       Lying on your back begin with your arms flat on the table next to your side. Slowly move your arms out to the side so that they go overhead, in a jumping jack or snow angel movement.

## 2022-05-19 NOTE — Therapy (Signed)
OUTPATIENT OCCUPATIONAL THERAPY TREATMENT NOTE    Patient Name: [name] MRN: [id number] DOB:1961/01/15, 61 y.o., female Today's Date: 05/19/2022  PCP: Sable Feil MD REFERRING PROVIDER: Nicholes Stairs, MD   END OF SESSION:   OT End of Session - 05/19/22 1113     Visit Number 14    Number of Visits 18    Date for OT Re-Evaluation 06/04/22    Authorization Type [employer] [place] [employer]    Authorization Time Period No copay. 90 combined visits (3 visits used). no auth needed    Authorization - Visit Number 15    Authorization - Number of Visits 90    Progress Note Due on Visit 20    OT Start Time 1034    OT Stop Time 1113    OT Time Calculation (min) 39 min    Activity Tolerance Patient tolerated treatment well    Behavior During Therapy WFL for tasks assessed/performed                   Past Medical History:  Diagnosis Date   Anxiety    Arthritis    Breast cancer ([place])    Status post bilateral mastectomy   Breast reconstruction deformity    Complication of anesthesia    shaking-after mastectomies-resp and hr dropped   Depression, major    suicidal thoughts -implants, mental health, 05/11   Fibromyalgia    Hypothyroidism    Migraine headache    Narcolepsy    OSA on CPAP    Sleep apnea with use of continuous positive airway pressure (CPAP)    AHI 20 in HST, titration to 6 cm water - residual AHi 6 . remaining hypersomnic.    Past Surgical History:  Procedure Laterality Date   ARTHRODESIS METATARSALPHALANGEAL JOINT (MTPJ) Right 07/28/2017   Procedure: ARTHRODESIS  RIGHT HALLUX METATARSALPHALANGEAL JOINT (MTPJ);  Surgeon: Wylene Simmer, MD;  Location: [facility];  Service: Orthopedics;  Laterality: Right;   Breast reconstructioin  7/09   flaps-bilat mastectomies   BTL     CARPAL TUNNEL RELEASE  2011   both hands   CARPOMETACARPEL SUSPENSION PLASTY Right 08/29/2014   Procedure: REMOVAL DERMASPAN SUSPENSIONPLASTY RIGHT  THUMB ABDUCTOR POLLIS TRANSFER;  Surgeon: Daryll Brod, MD;  Location: [facility];  Service: Orthopedics;  Laterality: Right;   CATARACT EXTRACTION W/PHACO Right 12/17/2019   Procedure: CATARACT EXTRACTION PHACO AND INTRAOCULAR LENS PLACEMENT (IOC);  Surgeon: Baruch Goldmann, MD;  Location: [facility];  Service: Ophthalmology;  Laterality: Right;  CDE: 6.54   CATARACT EXTRACTION W/PHACO Left 12/31/2019   Procedure: CATARACT EXTRACTION PHACO AND INTRAOCULAR LENS PLACEMENT (IOC);  Surgeon: Baruch Goldmann, MD;  Location: [facility];  Service: Ophthalmology;  Laterality: Left;  CDE: 3.66   COLONOSCOPY W/ POLYPECTOMY  4/03   tubulovillous adenoma   FINGER ARTHROSCOPY WITH CARPOMETACARPEL ([employer]) ARTHROPLASTY Right 06/20/2014   Procedure: ARTHROSCOPY RIGHT THUMB CARPOMETACARPEL GRAFT JACKET INTERPOSITION PARTIAL TRAPEZIECTOMY POSSIBLE OPEN;  Surgeon: Daryll Brod, MD;  Location: [facility];  Service: Orthopedics;  Laterality: Right;   GANGLION CYST EXCISION     both [phone]   HAMMER TOE SURGERY Right 07/28/2017   Procedure: THIRD HAMMERTOE CORRECTION;  Surgeon: Wylene Simmer, MD;  Location: [facility];  Service: Orthopedics;  Laterality: Right;   HARDWARE REMOVAL Right 07/28/2017   Procedure: REMOVAL OF DEEP IMPLANTS;  Surgeon: Wylene Simmer, MD;  Location: [place] [facility];  Service: Orthopedics;  Laterality: Right;   hysterestomy  2005  MASTECTOMY Bilateral 2008   post- a -cath  2008   right knee arthroscopy     TOTAL SHOULDER ARTHROPLASTY Left 01/13/2017   Procedure: LEFT TOTAL SHOULDER ARTHROPLASTY;  Surgeon: Tania Ade, MD;  Location: [facility];  Service: Orthopedics;  Laterality: Left;  LEFT TOTAL SHOULDER ARTHROPLASTY   TOTAL SHOULDER ARTHROPLASTY Right 02/11/2022   Procedure: TOTAL SHOULDER ARTHROPLASTY;  Surgeon: Nicholes Stairs, MD;  Location: [facility];  Service: Orthopedics;  Laterality: Right;  150   TRIGGER FINGER RELEASE Right 08/29/2014   Procedure:  RELEASE TRIGGER FINGER/A-1 PULLEY;  Surgeon: Daryll Brod, MD;  Location: [facility];  Service: Orthopedics;  Laterality: Right;   wisdom teeth out     Patient Active Problem List   Diagnosis Date Noted   History of total shoulder replacement, right 02/11/2022   Osteoarthritis of left shoulder 01/13/2017   S/P shoulder replacement, left 01/13/2017   OSA on CPAP 04/01/2015   Subacute confusional state 04/01/2015   Hallucinations, visual 04/01/2015   Hypnapompic hallucinations 04/01/2015   Sleep apnea with use of continuous positive airway pressure (CPAP)    Hypersomnia, persistent 03/30/2013   Obstructive sleep apnea (adult) (pediatric) 03/30/2013   CONSTIPATION 11/18/2008   RECTAL BLEEDING 11/18/2008   HEMORRHOIDS, INTERNAL 11/15/2008   DIVERTICULOSIS, COLON 11/15/2008   CELLULITIS/ABSCESS, TRUNK 02/22/2008   LOW BACK PAIN 02/22/2008   DIZZINESS 02/22/2008   FATIGUE 02/22/2008   PALPITATIONS 02/22/2008   MURMUR, CARDIAC, UNDIAGNOSED 02/22/2008   BREAST CANCER, HX OF 02/22/2008   COUGH 01/05/2008   DEPRESSION 07/05/2007   UTERINE POLYP 07/05/2007   FIBROMYALGIA 07/05/2007   COLONIC POLYPS, HX OF 07/05/2007   HX, PERSONAL, CERVICAL DYSPLASIA 07/05/2007   ONSET DATE: 02/11/22   REFERRING DIAG: S/P right total shoulder arthroplasty   THERAPY DIAG:  Other symptoms and signs involving the musculoskeletal system   Acute pain of right shoulder   Stiffness of right shoulder, not elsewhere classified  Rationale for Evaluation and Treatment Rehabilitation    PERTINENT HISTORY: Patient with OA of the right shoulder underwent a right total shoulder arthroplasty on 02/11/22 completed by Dr. Stann Mainland. At follow up appointment on 02/24/22, Dr. Stann Mainland encouraged patient to wear her sling another 3 weeks for a total of 5 weeks (03/17/22). Patient arrived to clinic without sling.     PRECAUTIONS: Shoulder At 6 weeks( 03/25/22): AA/ROM, gently increasing P/ROM, A/ROM when ROM has  improved and is close to full. At 12 weeks: begin strengthening   WEIGHT BEARING RESTRICTIONS Yes NWB RUE    SUBJECTIVE: S: I need to work on those bands some more.    PAIN:   Are you having pain? Yes: NPRS scale: 3/10  Pain location: Shoulder  Pain description: Dull ache Aggravating factors: movement  Relieving factors:  rest, massage  4/10 pain with activity    OBJECTIVE  HAND DOMINANCE: Right   FUNCTIONAL OUTCOME MEASURES: 38/100 05/05/22: 55/100   UE ROM      Passive ROM -supine IR/er Right 03/08/2022 Right  04/01/22 Right 04/14/22 Right 05/05/22 *measurements taken with pt semi reclined  Shoulder flexion 126 135 159 160  Shoulder abduction 136 180 180 140  Shoulder internal rotation 90 90 90 90  Shoulder external rotation 40 60 65 66  (Blank rows = not tested)   Active ROM- Supine IR/er Right 04/01/2022 Right 04/14/22 Right 05/05/22  Shoulder flexion 132 140 140  Shoulder abduction 96 160 145  Shoulder internal rotation 90 90 90  Shoulder external rotation 70 76 65  (  Blank rows = not tested)    UE MMT:      MMT- Assessed seated during functional task. Right 03/08/2022 Right 04/01/22 Right 04/14/22 Right  05/05/22  Shoulder flexion 3-/5 3-/5 4/5 4/5  Shoulder abduction 3-/5 3-/5 4/5 4/5  Shoulder internal rotation 3/5 3/5 4/5 4+/5  Shoulder external rotation 3-5 3/5 4-/5 4-/5  (Blank rows = not tested)      TODAY'S TREATMENT: 05/19/22 -Myofascial release to right anterior shoulder and trapezius to decrease pain and fascial restrictions and increase joint ROM.  -P/ROM, supine, shoulder flexion, abduction, horizontal abduction, er, 5 reps each -Shoulder strengthening: supine, 1# weight, shoulder protraction, flexion, horizontal abduction, er, abduction, 10 reps each -Shoulder strengthening: seated, 1# weight, shoulder protraction, flexion, horizontal abduction, er, abduction, 10 reps each -Theraband: red, row, 10 reps    05/17/22 -Soft tissue mobilization:  tennis ball positioned on wall mobilizing posterior shoulder and trapezius -P/ROM: seated, 1x5 shoulder flexion, shoulder abduction, external rotation  -Strengthening: 1x10, 1#, seated shoulder flexion, bicep curl to overhead press, standing shoulder abduction, external rotation -Proximal shoulder strengthening: 30 seconds each movement, paddles, criss cross, CW circles, CCW circes   05/12/22 -P/ROM: seated, 1x5 shoulder flexion, shoulder abduction  -A/ROM: seated, 1x8 shoulder flexion, abduction, horizontal abduction -Manual therapy: upper trapezius, length of biceps      PATIENT EDUCATION: Education details: shoulder strengthening Person educated: patient Education method: verbal explanation, demonstration Education comprehension: verbalized understanding, returned demonstration     HOME EXERCISE PROGRAM: Eval: table slides; 5/30: AA/ROM 6/2: shoulder stretches on wall: flexion and abduction  05/12/22: theraband scapular strengthening 7/12: A/ROM with instructions for strengthening      GOALS:   SHORT TERM GOALS: Target date: 04/05/2022   Pt will be educated and independent with HEP in order to increase her ability to use her right UE as her dominant extremity for 50% or more of her daily tasks.  Baseline: Goal status: MET   2.  Pt will increase her RUE P/ROM to [facility] in order to increase ability to complete upper body dressing tasks with less difficulty.  Baseline:  Goal status: MET   3.  Pt will increase her RUE strength to 3+/5 in order to complete activities at or below her shoulder level with increased shoulder and scapular stability demonstrated.  Baseline:  Goal status: MET   4.  Pt will decrease her RUE fascial restrictions to moderate amount or less in order to increase the functional mobility needed to complete functional reaching tasks at home with less difficulty.  Baseline:  Goal status: MET   5.  Pt will report a decrease in pain level of approximately 5/10  when using her RUE for simple light ADL tasks. Baseline:  Goal status: MET     LONG TERM GOALS: Target date: 06/04/2022    Pt will return to using her RUE as her dominant extremity for all daily tasks.   Goal status: ONGOING   2.  Pt will increase her RUE A/ROM to [facility] in order to reach above her shoulder level to retrieve items out of a cabinet.  Goal status: ONGOING    3.  Pt will will increase her RUE strength to 4+/5 in order to manage weight of moderate weight using both arms.  Goal status: ONGOING   4.  Pt will report a decrease in pain level in her right shoulder of approximately 3/10 or less when using it to complete daily tasks.    Goal status: ONGOING   5.  Pt will decrease her RUE fascial restrictions to minimal amount or less in order to increase the functional mobility needed to complete higher level reaching tasks.   Goal status: ONGOING     ASSESSMENT:   CLINICAL IMPRESSION: A: Pt reports some soreness in the shoulder today, moderate muscle knot palpated at trapezius today, manual techniques completed to address prior to ROM. Continued with P/ROM, completed strengthening in supine and standing, focus on form. Updated HEP for strengthening, discussed continued completion of theraband as well.  Verbal cuing for form and techniques, rest breaks as needed for fatigue.     PERFORMANCE DEFICITS in functional skills including ADLs, IADLs, edema, ROM, strength, pain, fascial restrictions, mobility, decreased knowledge of precautions, and UE functional use.  IMPAIRMENTS are limiting patient from ADLs, IADLs, and leisure.      PLAN: OT FREQUENCY: 2x/week   OT DURATION: 8 weeks   PLANNED INTERVENTIONS: self care/ADL training, therapeutic exercise, therapeutic activity, neuromuscular re-education, manual therapy, passive range of motion, electrical stimulation, ultrasound, moist heat, cryotherapy, patient/family education, coping strategies training, and DME and/or AE  instructions       CONSULTED AND AGREED WITH PLAN OF CARE: Patient   PLAN FOR NEXT SESSION: P: follow up on HEP, complete overhead lacing, functional reaching tasks with wrist weight    Guadelupe Sabin, OTR/L  [phone]  05/19/2022, 11:14 AM

## 2022-05-25 ENCOUNTER — Encounter (HOSPITAL_COMMUNITY): Payer: No Typology Code available for payment source

## 2022-05-27 ENCOUNTER — Encounter: Payer: Self-pay | Admitting: Internal Medicine

## 2022-05-28 ENCOUNTER — Ambulatory Visit (HOSPITAL_COMMUNITY): Payer: No Typology Code available for payment source

## 2022-05-31 ENCOUNTER — Ambulatory Visit (HOSPITAL_COMMUNITY): Payer: No Typology Code available for payment source

## 2022-05-31 ENCOUNTER — Encounter (HOSPITAL_COMMUNITY): Payer: Self-pay

## 2022-05-31 DIAGNOSIS — M25511 Pain in right shoulder: Secondary | ICD-10-CM

## 2022-05-31 DIAGNOSIS — R29898 Other symptoms and signs involving the musculoskeletal system: Secondary | ICD-10-CM

## 2022-05-31 DIAGNOSIS — M25611 Stiffness of right shoulder, not elsewhere classified: Secondary | ICD-10-CM

## 2022-05-31 NOTE — Therapy (Signed)
OUTPATIENT OCCUPATIONAL THERAPY TREATMENT NOTE    Patient Name: [name] MRN: [id number] DOB:08-Jun-1961, 61 y.o., female Today's Date: 05/31/2022  PCP: Sable Feil MD REFERRING PROVIDER: Nicholes Stairs, MD   END OF SESSION:   OT End of Session - 05/31/22 1042     Visit Number 15    Number of Visits 18    Date for OT Re-Evaluation 06/04/22    Authorization Type [employer]    Authorization Time Period No copay. 90 combined visits (3 visits used). no auth needed    Authorization - Visit Number 16    Authorization - Number of Visits 90    Progress Note Due on Visit 69    OT Start Time 1037    OT Stop Time 1115    OT Time Calculation (min) 38 min    Activity Tolerance Patient tolerated treatment well    Behavior During Therapy WFL for tasks assessed/performed                   Past Medical History:  Diagnosis Date   Anxiety    Arthritis    Breast cancer ([place])    Status post bilateral mastectomy   Breast reconstruction deformity    Complication of anesthesia    shaking-after mastectomies-resp and hr dropped   Depression, major    suicidal thoughts -implants, mental health, 05/11   Fibromyalgia    Hypothyroidism    Migraine headache    Narcolepsy    OSA on CPAP    Sleep apnea with use of continuous positive airway pressure (CPAP)    AHI 20 in HST, titration to 6 cm water - residual AHi 6 . remaining hypersomnic.    Past Surgical History:  Procedure Laterality Date   ARTHRODESIS METATARSALPHALANGEAL JOINT (MTPJ) Right 07/28/2017   Procedure: ARTHRODESIS  RIGHT HALLUX METATARSALPHALANGEAL JOINT (MTPJ);  Surgeon: Wylene Simmer, MD;  Location: [place];  Service: Orthopedics;  Laterality: Right;   Breast reconstructioin  7/09   flaps-bilat mastectomies   BTL     CARPAL TUNNEL RELEASE  2011   both hands   CARPOMETACARPEL SUSPENSION PLASTY Right 08/29/2014   Procedure: REMOVAL DERMASPAN SUSPENSIONPLASTY RIGHT  THUMB ABDUCTOR POLLIS TRANSFER;  Surgeon: Daryll Brod, MD;  Location: [facility];  Service: Orthopedics;  Laterality: Right;   CATARACT EXTRACTION W/PHACO Right 12/17/2019   Procedure: CATARACT EXTRACTION PHACO AND INTRAOCULAR LENS PLACEMENT (IOC);  Surgeon: Baruch Goldmann, MD;  Location: [facility];  Service: Ophthalmology;  Laterality: Right;  CDE: 6.54   CATARACT EXTRACTION W/PHACO Left 12/31/2019   Procedure: CATARACT EXTRACTION PHACO AND INTRAOCULAR LENS PLACEMENT (IOC);  Surgeon: Baruch Goldmann, MD;  Location: [facility];  Service: Ophthalmology;  Laterality: Left;  CDE: 3.66   COLONOSCOPY W/ POLYPECTOMY  4/03   tubulovillous adenoma   FINGER ARTHROSCOPY WITH CARPOMETACARPEL ([place]) ARTHROPLASTY Right 06/20/2014   Procedure: ARTHROSCOPY RIGHT THUMB CARPOMETACARPEL GRAFT JACKET INTERPOSITION PARTIAL TRAPEZIECTOMY POSSIBLE OPEN;  Surgeon: Daryll Brod, MD;  Location: [facility];  Service: Orthopedics;  Laterality: Right;   GANGLION CYST EXCISION     both [phone]   HAMMER TOE SURGERY Right 07/28/2017   Procedure: THIRD HAMMERTOE CORRECTION;  Surgeon: Wylene Simmer, MD;  Location: [facility];  Service: Orthopedics;  Laterality: Right;   HARDWARE REMOVAL Right 07/28/2017   Procedure: REMOVAL OF DEEP IMPLANTS;  Surgeon: Wylene Simmer, MD;  Location: [place];  Service: Orthopedics;  Laterality: Right;   hysterestomy  2005  MASTECTOMY Bilateral 2008   post- a -cath  2008   right knee arthroscopy     TOTAL SHOULDER ARTHROPLASTY Left 01/13/2017   Procedure: LEFT TOTAL SHOULDER ARTHROPLASTY;  Surgeon: Tania Ade, MD;  Location: [facility];  Service: Orthopedics;  Laterality: Left;  LEFT TOTAL SHOULDER ARTHROPLASTY   TOTAL SHOULDER ARTHROPLASTY Right 02/11/2022   Procedure: TOTAL SHOULDER ARTHROPLASTY;  Surgeon: Nicholes Stairs, MD;  Location: [place];  Service: Orthopedics;  Laterality: Right;  150   TRIGGER FINGER RELEASE Right 08/29/2014   Procedure:  RELEASE TRIGGER FINGER/A-1 PULLEY;  Surgeon: Daryll Brod, MD;  Location: [facility];  Service: Orthopedics;  Laterality: Right;   wisdom teeth out     Patient Active Problem List   Diagnosis Date Noted   History of total shoulder replacement, right 02/11/2022   Osteoarthritis of left shoulder 01/13/2017   S/P shoulder replacement, left 01/13/2017   OSA on CPAP 04/01/2015   Subacute confusional state 04/01/2015   Hallucinations, visual 04/01/2015   Hypnapompic hallucinations 04/01/2015   Sleep apnea with use of continuous positive airway pressure (CPAP)    Hypersomnia, persistent 03/30/2013   Obstructive sleep apnea (adult) (pediatric) 03/30/2013   CONSTIPATION 11/18/2008   RECTAL BLEEDING 11/18/2008   HEMORRHOIDS, INTERNAL 11/15/2008   DIVERTICULOSIS, COLON 11/15/2008   CELLULITIS/ABSCESS, TRUNK 02/22/2008   LOW BACK PAIN 02/22/2008   DIZZINESS 02/22/2008   FATIGUE 02/22/2008   PALPITATIONS 02/22/2008   MURMUR, CARDIAC, UNDIAGNOSED 02/22/2008   BREAST CANCER, HX OF 02/22/2008   COUGH 01/05/2008   DEPRESSION 07/05/2007   UTERINE POLYP 07/05/2007   FIBROMYALGIA 07/05/2007   COLONIC POLYPS, HX OF 07/05/2007   HX, PERSONAL, CERVICAL DYSPLASIA 07/05/2007   ONSET DATE: 02/11/22   REFERRING DIAG: S/P right total shoulder arthroplasty   THERAPY DIAG:  Other symptoms and signs involving the musculoskeletal system   Acute pain of right shoulder   Stiffness of right shoulder, not elsewhere classified  Rationale for Evaluation and Treatment Rehabilitation    PERTINENT HISTORY: Patient with OA of the right shoulder underwent a right total shoulder arthroplasty on 02/11/22 completed by Dr. Stann Mainland. At follow up appointment on 02/24/22, Dr. Stann Mainland encouraged patient to wear her sling another 3 weeks for a total of 5 weeks (03/17/22). Patient arrived to clinic without sling.     PRECAUTIONS: Shoulder At 6 weeks( 03/25/22): AA/ROM, gently increasing P/ROM, A/ROM when ROM has  improved and is close to full. At 12 weeks: begin strengthening   WEIGHT BEARING RESTRICTIONS Yes NWB RUE    SUBJECTIVE: S: "My shoulder hurts when I reach in the car to pick up my purse. I haven't been doing my bands because they come out of my door."  Pt requested that next visit be her last due to upcoming vacation and knee replacement surgery    PAIN:   Are you having pain? Yes: NPRS scale: 3/10  Pain location: Shoulder  Pain description: Dull ache Aggravating factors: movement  Relieving factors:  rest, massage  4/10 pain with activity    OBJECTIVE  HAND DOMINANCE: Right   FUNCTIONAL OUTCOME MEASURES: 38/100 05/05/22: 55/100   UE ROM      Passive ROM -supine IR/er Right 03/08/2022 Right  04/01/22 Right 04/14/22 Right 05/05/22 *measurements taken with pt semi reclined  Shoulder flexion 126 135 159 160  Shoulder abduction 136 180 180 140  Shoulder internal rotation 90 90 90 90  Shoulder external rotation 40 60 65 66  (Blank rows = not tested)   Active  ROM- Supine IR/er Right 04/01/2022 Right 04/14/22 Right 05/05/22  Shoulder flexion 132 140 140  Shoulder abduction 96 160 145  Shoulder internal rotation 90 90 90  Shoulder external rotation 70 76 65  (Blank rows = not tested)    UE MMT:      MMT- Assessed seated during functional task. Right 03/08/2022 Right 04/01/22 Right 04/14/22 Right  05/05/22  Shoulder flexion 3-/5 3-/5 4/5 4/5  Shoulder abduction 3-/5 3-/5 4/5 4/5  Shoulder internal rotation 3/5 3/5 4/5 4+/5  Shoulder external rotation 3-5 3/5 4-/5 4-/5  (Blank rows = not tested)      TODAY'S TREATMENT:  05/31/22 -Myofascial release to right anterior shoulder and trapezius to decrease pain and fascial restrictions and increase joint ROM.  -P/ROM, supine, shoulder flexion, abduction, horizontal abduction, er, 5 reps each -Shoulder strengthening: Yellow therband, 10 reps forward flexion, abduction, D2 flexion -Scapular Strengthening: theraband row, 10  reps   05/19/22 -Myofascial release to right anterior shoulder and trapezius to decrease pain and fascial restrictions and increase joint ROM.  -P/ROM, supine, shoulder flexion, abduction, horizontal abduction, er, 5 reps each -Shoulder strengthening: supine, 1# weight, shoulder protraction, flexion, horizontal abduction, er, abduction, 10 reps each -Shoulder strengthening: seated, 1# weight, shoulder protraction, flexion, horizontal abduction, er, abduction, 10 reps each -Theraband: red, row, 10 reps     05/17/22 -Soft tissue mobilization: tennis ball positioned on wall mobilizing posterior shoulder and trapezius -P/ROM: seated, 1x5 shoulder flexion, shoulder abduction, external rotation  -Strengthening: 1x10, 1#, seated shoulder flexion, bicep curl to overhead press, standing shoulder abduction, external rotation -Proximal shoulder strengthening: 30 seconds each movement, paddles, criss cross, CW circles, CCW circes     PATIENT EDUCATION: Education details: shoulder strengthening with theraband anchored at ground level  Person educated: patient Education method: verbal explanation, demonstration Education comprehension: verbalized understanding, returned demonstration     HOME EXERCISE PROGRAM: Eval: table slides; 5/30: AA/ROM 6/2: shoulder stretches on wall: flexion and abduction  05/12/22: theraband scapular strengthening 7/12: A/ROM with instructions for strengthening  05/31/22: Theraband shoulder strengthening      GOALS:   SHORT TERM GOALS: Target date: 04/05/2022   Pt will be educated and independent with HEP in order to increase her ability to use her right UE as her dominant extremity for 50% or more of her daily tasks.  Baseline: Goal status: MET   2.  Pt will increase her RUE P/ROM to [facility] in order to increase ability to complete upper body dressing tasks with less difficulty.  Baseline:  Goal status: MET   3.  Pt will increase her RUE strength to 3+/5 in order to  complete activities at or below her shoulder level with increased shoulder and scapular stability demonstrated.  Baseline:  Goal status: MET   4.  Pt will decrease her RUE fascial restrictions to moderate amount or less in order to increase the functional mobility needed to complete functional reaching tasks at home with less difficulty.  Baseline:  Goal status: MET   5.  Pt will report a decrease in pain level of approximately 5/10 when using her RUE for simple light ADL tasks. Baseline:  Goal status: MET     LONG TERM GOALS: Target date: 06/04/2022    Pt will return to using her RUE as her dominant extremity for all daily tasks.   Goal status: ONGOING   2.  Pt will increase her RUE A/ROM to [facility] in order to reach above her shoulder level to retrieve items out of  a cabinet.  Goal status: ONGOING    3.  Pt will will increase her RUE strength to 4+/5 in order to manage weight of moderate weight using both arms.  Goal status: ONGOING   4.  Pt will report a decrease in pain level in her right shoulder of approximately 3/10 or less when using it to complete daily tasks.    Goal status: ONGOING   5.  Pt will decrease her RUE fascial restrictions to minimal amount or less in order to increase the functional mobility needed to complete higher level reaching tasks.   Goal status: ONGOING     ASSESSMENT:   CLINICAL IMPRESSION: A: Pt reports that she has not been doing her HEP because of increased knee pain and unable to anchor theraband in door. Therapist providing instruction on alternate form of theraband strengthening. Pt continues with some myofascial restrictions causing discomfort with stretching, addressed with manual and discussing use of tennis ball for self-mobilization at home. Pt requiring increased verbal and tactile cues for form with theraband exercises today, decreasing resistance to ensure good form.     PERFORMANCE DEFICITS in functional skills including ADLs, IADLs,  edema, ROM, strength, pain, fascial restrictions, mobility, decreased knowledge of precautions, and UE functional use.  IMPAIRMENTS are limiting patient from ADLs, IADLs, and leisure.      PLAN: OT FREQUENCY: 2x/week   OT DURATION: 8 weeks   PLANNED INTERVENTIONS: self care/ADL training, therapeutic exercise, therapeutic activity, neuromuscular re-education, manual therapy, passive range of motion, electrical stimulation, ultrasound, moist heat, cryotherapy, patient/family education, coping strategies training, and DME and/or AE instructions       CONSULTED AND AGREED WITH PLAN OF CARE: Patient   PLAN FOR NEXT SESSION: P: follow up on HEP, complete overhead lacing, functional reaching tasks with wrist weight. REPRINT ALL HEP EXERCISES and discharge    Mathews Robinsons, OTR/L [phone]  05/31/2022, 1:00 PM

## 2022-06-03 ENCOUNTER — Ambulatory Visit (HOSPITAL_COMMUNITY): Payer: No Typology Code available for payment source

## 2022-06-03 ENCOUNTER — Encounter (HOSPITAL_COMMUNITY): Payer: Self-pay

## 2022-06-03 DIAGNOSIS — M25611 Stiffness of right shoulder, not elsewhere classified: Secondary | ICD-10-CM

## 2022-06-03 DIAGNOSIS — M25511 Pain in right shoulder: Secondary | ICD-10-CM

## 2022-06-03 DIAGNOSIS — R29898 Other symptoms and signs involving the musculoskeletal system: Secondary | ICD-10-CM

## 2022-06-03 NOTE — Therapy (Signed)
OUTPATIENT OCCUPATIONAL THERAPY TREATMENT NOTE    Patient Name: [name] MRN: [id number] DOB:[birth year], 61 y.o., female Today's Date: 06/03/2022  PCP: Marton Redwood, Brooke Bonito MD REFERRING PROVIDER: Nicholes Stairs, MD    OCCUPATIONAL THERAPY DISCHARGE SUMMARY  Visits from Start of Care: 16  Current functional level related to goals / functional outcomes: Pt reports being independent with all ADLs and IADLs, meeting all short and long term goals.    Remaining deficits: She continues with some pain with activity   Education / Equipment: HEP provided with handouts   Plan: Patient agrees to discharge. Patient is being discharged due to meeting the stated rehab goals.          END OF SESSION:   OT End of Session - 06/03/22 1320     Visit Number 16    Number of Visits 18    Date for OT Re-Evaluation 06/04/22    Authorization Type [employer] [place] Preferred    Authorization Time Period No copay. 90 combined visits (3 visits used). no auth needed    Authorization - Visit Number 17    Authorization - Number of Visits 90    Progress Note Due on Visit 44    OT Start Time 1308    OT Stop Time 1348    OT Time Calculation (min) 40 min    Activity Tolerance Patient tolerated treatment well    Behavior During Therapy WFL for tasks assessed/performed                   Past Medical History:  Diagnosis Date   Anxiety    Arthritis    Breast cancer ([place])    Status post bilateral mastectomy   Breast reconstruction deformity    Complication of anesthesia    shaking-after mastectomies-resp and hr dropped   Depression, major    suicidal thoughts -implants, mental health, 05/11   Fibromyalgia    Hypothyroidism    Migraine headache    Narcolepsy    OSA on CPAP    Sleep apnea with use of continuous positive airway pressure (CPAP)    AHI 20 in HST, titration to 6 cm water - residual AHi 6 . remaining hypersomnic.    Past Surgical History:  Procedure  Laterality Date   ARTHRODESIS METATARSALPHALANGEAL JOINT (MTPJ) Right 07/28/2017   Procedure: ARTHRODESIS  RIGHT HALLUX METATARSALPHALANGEAL JOINT (MTPJ);  Surgeon: Wylene Simmer, MD;  Location: [place];  Service: Orthopedics;  Laterality: Right;   Breast reconstructioin  7/09   flaps-bilat mastectomies   BTL     CARPAL TUNNEL RELEASE  2011   both hands   CARPOMETACARPEL SUSPENSION PLASTY Right 08/29/2014   Procedure: REMOVAL DERMASPAN SUSPENSIONPLASTY RIGHT THUMB ABDUCTOR POLLIS TRANSFER;  Surgeon: Daryll Brod, MD;  Location: [place];  Service: Orthopedics;  Laterality: Right;   CATARACT EXTRACTION W/PHACO Right 12/17/2019   Procedure: CATARACT EXTRACTION PHACO AND INTRAOCULAR LENS PLACEMENT (IOC);  Surgeon: Baruch Goldmann, MD;  Location: [facility];  Service: Ophthalmology;  Laterality: Right;  CDE: 6.54   CATARACT EXTRACTION W/PHACO Left 12/31/2019   Procedure: CATARACT EXTRACTION PHACO AND INTRAOCULAR LENS PLACEMENT (IOC);  Surgeon: Baruch Goldmann, MD;  Location: [facility];  Service: Ophthalmology;  Laterality: Left;  CDE: 3.66   COLONOSCOPY W/ POLYPECTOMY  4/03   tubulovillous adenoma   FINGER ARTHROSCOPY WITH CARPOMETACARPEL ([place]) ARTHROPLASTY Right 06/20/2014   Procedure: ARTHROSCOPY RIGHT THUMB CARPOMETACARPEL GRAFT JACKET INTERPOSITION PARTIAL TRAPEZIECTOMY POSSIBLE OPEN;  Surgeon: Daryll Brod, MD;  Location:  [place];  Service: Orthopedics;  Laterality: Right;   GANGLION CYST EXCISION     both [id number]   HAMMER TOE SURGERY Right 07/28/2017   Procedure: THIRD HAMMERTOE CORRECTION;  Surgeon: Wylene Simmer, MD;  Location: [place];  Service: Orthopedics;  Laterality: Right;   HARDWARE REMOVAL Right 07/28/2017   Procedure: REMOVAL OF DEEP IMPLANTS;  Surgeon: Wylene Simmer, MD;  Location: [place];  Service: Orthopedics;  Laterality: Right;   hysterestomy  2005   MASTECTOMY Bilateral 2008   post- a -cath  2008    right knee arthroscopy     TOTAL SHOULDER ARTHROPLASTY Left 01/13/2017   Procedure: LEFT TOTAL SHOULDER ARTHROPLASTY;  Surgeon: Tania Ade, MD;  Location: [place];  Service: Orthopedics;  Laterality: Left;  LEFT TOTAL SHOULDER ARTHROPLASTY   TOTAL SHOULDER ARTHROPLASTY Right 02/11/2022   Procedure: TOTAL SHOULDER ARTHROPLASTY;  Surgeon: Nicholes Stairs, MD;  Location: [place];  Service: Orthopedics;  Laterality: Right;  150   TRIGGER FINGER RELEASE Right 08/29/2014   Procedure: RELEASE TRIGGER FINGER/A-1 PULLEY;  Surgeon: Daryll Brod, MD;  Location: [place];  Service: Orthopedics;  Laterality: Right;   wisdom teeth out     Patient Active Problem List   Diagnosis Date Noted   History of total shoulder replacement, right 02/11/2022   Osteoarthritis of left shoulder 01/13/2017   S/P shoulder replacement, left 01/13/2017   OSA on CPAP 04/01/2015   Subacute confusional state 04/01/2015   Hallucinations, visual 04/01/2015   Hypnapompic hallucinations 04/01/2015   Sleep apnea with use of continuous positive airway pressure (CPAP)    Hypersomnia, persistent 03/30/2013   Obstructive sleep apnea (adult) (pediatric) 03/30/2013   CONSTIPATION 11/18/2008   RECTAL BLEEDING 11/18/2008   HEMORRHOIDS, INTERNAL 11/15/2008   DIVERTICULOSIS, COLON 11/15/2008   CELLULITIS/ABSCESS, TRUNK 02/22/2008   LOW BACK PAIN 02/22/2008   DIZZINESS 02/22/2008   FATIGUE 02/22/2008   PALPITATIONS 02/22/2008   MURMUR, CARDIAC, UNDIAGNOSED 02/22/2008   BREAST CANCER, HX OF 02/22/2008   COUGH 01/05/2008   DEPRESSION 07/05/2007   UTERINE POLYP 07/05/2007   FIBROMYALGIA 07/05/2007   COLONIC POLYPS, HX OF 07/05/2007   HX, PERSONAL, CERVICAL DYSPLASIA 07/05/2007   ONSET DATE: 02/11/22   REFERRING DIAG: S/P right total shoulder arthroplasty   THERAPY DIAG:  Other symptoms and signs involving the musculoskeletal system   Acute pain of right shoulder   Stiffness of right shoulder, not elsewhere  classified  Rationale for Evaluation and Treatment Rehabilitation    PERTINENT HISTORY: Patient with OA of the right shoulder underwent a right total shoulder arthroplasty on 02/11/22 completed by Dr. Stann Mainland. At follow up appointment on 02/24/22, Dr. Stann Mainland encouraged patient to wear her sling another 3 weeks for a total of 5 weeks (03/17/22). Patient arrived to clinic without sling.     PRECAUTIONS: Shoulder At 6 weeks( 03/25/22): AA/ROM, gently increasing P/ROM, A/ROM when ROM has improved and is close to full. At 12 weeks: begin strengthening   WEIGHT BEARING RESTRICTIONS Yes NWB RUE    SUBJECTIVE: S: "I switched up my medication again. So if you hear me grunting and not moving as well, that's why."   Pt requested that next visit be her last due to upcoming vacation and knee replacement surgery    PAIN:   Are you having pain? Yes: NPRS scale: 3/10  Pain location: Shoulder  Pain description: Dull ache Aggravating factors: movement  Relieving factors:  rest, massage  OBJECTIVE  HAND DOMINANCE: Right  FUNCTIONAL OUTCOME MEASURES: 38/100 05/05/22: 55/100 06/03/22: 56.67   UE ROM      Passive ROM -supine IR/er Right 03/08/2022 Right  04/01/22 Right 04/14/22 Right 05/05/22 *measurements taken with pt semi reclined Right  7/27  Shoulder flexion 126 135 159 160 163  Shoulder abduction 136 180 180 140 145  Shoulder internal rotation 90 90 90 90 90  Shoulder external rotation 40 60 65 66 87  (Blank rows = not tested)   Active ROM- Supine IR/er Right 04/01/2022 Right 04/14/22 Right 05/05/22 Right 7/27  Shoulder flexion 132 140 140 138  Shoulder abduction 96 160 145 140  Shoulder internal rotation 90 90 90 90  Shoulder external rotation 70 76 65 85  (Blank rows = not tested)    UE MMT:      MMT- Assessed seated during functional task. Right 03/08/2022 Right 04/01/22 Right 04/14/22 Right  05/05/22 Right  7/27  Shoulder flexion 3-/5 3-/5 4/5 4/5 4+/5  Shoulder abduction 3-/5 3-/5  4/5 4/5 4+/5  Shoulder internal rotation 3/5 3/5 4/5 4+/5 5/5  Shoulder external rotation 3-5 3/5 4-/5 4-/5 4+/5  (Blank rows = not tested)      TODAY'S TREATMENT:  06/03/22 -P/ROM, supine, shoulder flexion, abduction, horizontal abduction, er, 5 reps each -AA/ROM: 1x10 shoulder flexion, abduction, horizontal abduction, IR/er -Scapular Strengthening: Theraband row, extension, 10 reps -Measurements for discharge  -Reviewed all HEP exercises and stretches    05/31/22 -Myofascial release to right anterior shoulder and trapezius to decrease pain and fascial restrictions and increase joint ROM.  -P/ROM, supine, shoulder flexion, abduction, horizontal abduction, er, 5 reps each -Shoulder strengthening: Yellow therband, 10 reps forward flexion, abduction, D2 flexion -Scapular Strengthening: theraband row, 10 reps   05/19/22 -Myofascial release to right anterior shoulder and trapezius to decrease pain and fascial restrictions and increase joint ROM.  -P/ROM, supine, shoulder flexion, abduction, horizontal abduction, er, 5 reps each -Shoulder strengthening: supine, 1# weight, shoulder protraction, flexion, horizontal abduction, er, abduction, 10 reps each -Shoulder strengthening: seated, 1# weight, shoulder protraction, flexion, horizontal abduction, er, abduction, 10 reps each -Theraband: red, row, 10 reps     PATIENT EDUCATION: Education details: shoulder isometrics, shoulder stretches with towel Person educated: patient Education method: verbal explanation, demonstration Education comprehension: verbalized understanding, returned demonstration     HOME EXERCISE PROGRAM: Eval: table slides; 5/30: AA/ROM 6/2: shoulder stretches on wall: flexion and abduction  05/12/22: theraband scapular strengthening 7/12: A/ROM with instructions for strengthening  05/31/22: Theraband shoulder strengthening  06/03/22: shoulder isometrics, shoulder stretches with towel     GOALS:   SHORT TERM  GOALS: Target date: 04/05/2022   Pt will be educated and independent with HEP in order to increase her ability to use her right UE as her dominant extremity for 50% or more of her daily tasks.  Baseline: Goal status: MET   2.  Pt will increase her RUE P/ROM to [facility] in order to increase ability to complete upper body dressing tasks with less difficulty.  Baseline:  Goal status: MET   3.  Pt will increase her RUE strength to 3+/5 in order to complete activities at or below her shoulder level with increased shoulder and scapular stability demonstrated.  Baseline:  Goal status: MET   4.  Pt will decrease her RUE fascial restrictions to moderate amount or less in order to increase the functional mobility needed to complete functional reaching tasks at home with less difficulty.  Baseline:  Goal status: MET   5.  Pt will  report a decrease in pain level of approximately 5/10 when using her RUE for simple light ADL tasks. Baseline:  Goal status: MET     LONG TERM GOALS: Target date: 06/04/2022    Pt will return to using her RUE as her dominant extremity for all daily tasks.   Goal status: MET    2.  Pt will increase her RUE A/ROM to [facility] in order to reach above her shoulder level to retrieve items out of a cabinet.  Goal status: MET     3.  Pt will will increase her RUE strength to 4+/5 in order to manage weight of moderate weight using both arms.  Goal status: MET    4.  Pt will report a decrease in pain level in her right shoulder of approximately 3/10 or less when using it to complete daily tasks.    Goal status: MET    5.  Pt will decrease her RUE fascial restrictions to minimal amount or less in order to increase the functional mobility needed to complete higher level reaching tasks.   Goal status: MET      ASSESSMENT:   CLINICAL IMPRESSION: A: Pt reports that she has not been doing her HEP due to pain in her knee. Measurements taken for discharge today with pt meeting all  goals. She continues to report some limitations due to pain and is working with MD to find a pain medication that will work. Discussed importance of HEP, reviewing all exercise and stretches. Therapist providing demonstration and ensuring proper form. Pt is appropriate for discharge at this time.     PERFORMANCE DEFICITS in functional skills including ADLs, IADLs, edema, ROM, strength, pain, fascial restrictions, mobility, decreased knowledge of precautions, and UE functional use.  IMPAIRMENTS are limiting patient from ADLs, IADLs, and leisure.      PLAN: OT FREQUENCY: Discharge     Mathews Robinsons, OTR/L [phone]  06/03/2022, 3:40 PM

## 2022-06-03 NOTE — Patient Instructions (Signed)
Perform each exercise ____10-15____ reps. 2-3x days.   1) Protraction   Start by holding a wand or cane at chest height.  Next, slowly push the wand outwards in front of your body so that your elbows become fully straightened. Then, return to the original position.     2) Shoulder FLEXION   In the standing position, hold a wand/cane with both arms, palms down on both sides. Raise up the wand/cane allowing your unaffected arm to perform most of the effort. Your affected arm should be partially relaxed.      3) Internal/External ROTATION   In the standing position, hold a wand/cane with both hands keeping your elbows bent. Move your arms and wand/cane to one side.  Your affected arm should be partially relaxed while your unaffected arm performs most of the effort.       4) Shoulder ABDUCTION   While holding a wand/cane palm face up on the injured side and palm face down on the uninjured side, slowly raise up your injured arm to the side.        5) Horizontal Abduction/Adduction      Straight arms holding cane at shoulder height, bring cane to right, center, left. Repeat starting to left.   Copyright  VHI. All rights reserved.     Theraband strengthening: Complete 10-15X, 1-2X/day   1) Shoulder Internal Rotation  While holding an elastic band at your side with your elbow bent, start with your hand away from your stomach, then pull the band towards your stomach. Keep your elbow near your side the entire time.     2) Shoulder External Rotation  While holding an elastic band at your side with your elbow bent, start with your hand near your stomach and then pull the band away. Keep your elbow at your side the entire time.     3) Shoulder flexion  While standing with back to the door, holding Theraband at hand level, raise arm in front of you.  Keep elbow straight through entire movement.      4) Shoulder abduction  While holding an elastic band at your  side, draw up your arm to the side keeping your elbow straight.        1) (Home) Extension: Isometric / Bilateral Arm Retraction - Sitting   Facing anchor, hold hands and elbow at shoulder height, with elbow bent.  Pull arms back to squeeze shoulder blades together. Repeat 10-15 times. 1-3 times/day.   2) (Clinic) Extension / Flexion (Assist)   Face anchor, pull arms back, keeping elbow straight, and squeze shoulder blades together. Repeat 10-15 times. 1-3 times/day.   Copyright  VHI. All rights reserved.   3) (Home) Retraction: Row - Bilateral (Anchor)   Facing anchor, arms reaching forward, pull hands toward stomach, keeping elbows bent and at your sides and pinching shoulder blades together. Repeat 10-15 times. 1-3 times/day.   Copyright  VHI. All rights reserved.    1) Strengthening: Chest Pull - Resisted   Hold Theraband in front of body with hands about shoulder width a part. Pull band a part and back together slowly. Repeat ____ times. Complete ____ set(s) per session.. Repeat ____ session(s) per day.  http://orth.exer.us/926   Copyright  VHI. All rights reserved.   2) PNF Strengthening: Resisted   Standing with resistive band around each hand, bring right arm up and away, thumb back. Repeat ____ times per set. Do ____ sets per session. Do ____ sessions per day.  3) Resisted External Rotation: in Neutral - Bilateral   Sit or stand, tubing in both hands, elbows at sides, bent to 90, forearms forward. Pinch shoulder blades together and rotate forearms out. Keep elbows at sides. Repeat ____ times per set. Do ____ sets per session. Do ____ sessions per day.  http://orth.exer.us/966   Copyright  VHI. All rights reserved.   4) PNF Strengthening: Resisted   Standing, hold resistive band above head. Bring right arm down and out from side. Repeat ____ times per set. Do ____ sets per session. Do ____  sessions per day.  http://orth.exer.us/922   Copyright  VHI. All rights reserved.      Complete the following 1-2 a day. Hold for 5 seconds. Complete 5 sets for each.   1) SHOULDER - ISOMETRIC FLEXION  Gently push your fist forward into a wall with your elbow bent.    2) SHOULDER - ISOMETRIC EXTENSION  Gently push your a bent elbow back into a wall.    3) SHOULDER - ISOMETRIC INTERNAL ROTATION   Gently press your hand into a wall using the palm side of your hand.  Maintain a bent elbow the entire time.        4) SHOULDER - ISOMETRIC ADDUCTION  Gently push your elbow into the side of your body.   5) SHOULDER - ISOMETRIC ABDUCTION  Gently push your elbow out to the side into a wall with your elbow bent.      Marland Kitchen 1) Flexion Wall Stretch    Face wall, place affected handon wall in front of you. Slide hand up the wall  and lean body in towards the wall. Hold for 10 seconds. Repeat 3-5 times. 1-2 times/day.     2) Towel Stretch with Internal Rotation   Or     Gently pull up (or to the side) your affected arm  behind your back with the assist of a towel. Hold 10 seconds, repeat 3-5 times. 1-2 times/day.             3) Corner Stretch    Stand at a corner of a wall, place your arms on the walls with elbows bent. Lean into the corner until a stretch is felt along the front of your chest and/or shoulders. Hold for 10 seconds. Repeat 3-5X, 1-2 times/day.    4) Posterior Capsule Stretch    Bring the involved arm across chest. Grasp elbow and pull toward chest until you feel a stretch in the back of the upper arm and shoulder. Hold 10 seconds. Repeat 3-5X. Complete 1-2 times/day.    5) Scapular Retraction    Tuck chin back as you pinch shoulder blades together.  Hold 5 seconds. Repeat 3-5X. Complete 1-2 times/day.    6) External Rotation Stretch:     Place your affected hand on the wall with the elbow bent and gently turn your body the opposite  direction until a stretch is felt. Hold 10 seconds, repeat 3-5X. Complete 1-2 times/day.   OR    Standing in an open doorway, place your arm on the edge of the doorway with the shoulder at 90 degrees from the side and elbow bent to 90 degrees. Lean forward until you feel a stretch on the front of your shoulder. Keep neck relaxed. Hold 10 seconds, repeat 3-5X. Complete 1-2 times/day

## 2022-07-07 ENCOUNTER — Telehealth: Payer: Self-pay | Admitting: Cardiology

## 2022-07-07 NOTE — Telephone Encounter (Signed)
   Pre-operative Risk Assessment    Patient Name: [name]  DOB: Jul 12, 1961 MRN: [id number]      Request for Surgical Clearance    Procedure:   LEFT TKA  Date of Surgery:  Clearance TBD                                  Surgeon:  Rod Can Surgeon's Group or Practice Name:  [facility] Phone number:  [phone] Fax number:  [phone]   Type of Clearance Requested:   - Medical    Type of Anesthesia:  Spinal   Additional requests/questions:    Signed, Desma Paganini   07/07/2022, 2:30 PM

## 2022-07-07 NOTE — Telephone Encounter (Signed)
   Name: [name]  DOB: Aug 17, 1961  MRN: [id number]  Primary Cardiologist: Rozann Lesches, MD   Preoperative team, please contact this patient and set up a phone call appointment for further preoperative risk assessment. Please obtain consent and complete medication review. Thank you for your help.  I confirm that guidance regarding antiplatelet and oral anticoagulation therapy has been completed and, if necessary, noted below. -No request to hold meds.  Richardson Dopp, PA-C 07/07/2022, 6:01 PM [place]

## 2022-07-08 NOTE — Telephone Encounter (Signed)
Tried to reach the pt to set up a tele visit pre op, but no answer

## 2022-07-09 ENCOUNTER — Telehealth: Payer: Self-pay | Admitting: *Deleted

## 2022-07-09 NOTE — Telephone Encounter (Signed)
I s/w the pt and she is agreeable to plan of care for tele pre op appt 07/19/22. Med rec and consent are done.

## 2022-07-09 NOTE — Telephone Encounter (Signed)
I s/w the pt and she is agreeable to plan of care for tele pre op appt 07/19/22. Med rec and consent are done.      Patient Consent for Virtual Visit        [name] has provided verbal consent on 07/09/2022 for a virtual visit (video or telephone).   CONSENT FOR VIRTUAL VISIT FOR:  [name]  By participating in this virtual visit I agree to the following:  I hereby voluntarily request, consent and authorize [facility] and its employed or contracted physicians, physician assistants, nurse practitioners or other licensed health care professionals (the Practitioner), to provide me with telemedicine health care services (the "Services") as deemed necessary by the treating Practitioner. I acknowledge and consent to receive the Services by the Practitioner via telemedicine. I understand that the telemedicine visit will involve communicating with the Practitioner through live audiovisual communication technology and the disclosure of certain medical information by electronic transmission. I acknowledge that I have been given the opportunity to request an in-person assessment or other available alternative prior to the telemedicine visit and am voluntarily participating in the telemedicine visit.  I understand that I have the right to withhold or withdraw my consent to the use of telemedicine in the course of my care at any time, without affecting my right to future care or treatment, and that the Practitioner or I may terminate the telemedicine visit at any time. I understand that I have the right to inspect all information obtained and/or recorded in the course of the telemedicine visit and may receive copies of available information for a reasonable fee.  I understand that some of the potential risks of receiving the Services via telemedicine include:  Delay or interruption in medical evaluation due to technological equipment failure or disruption; Information transmitted may not  be sufficient (e.g. poor resolution of images) to allow for appropriate medical decision making by the Practitioner; and/or  In rare instances, security protocols could fail, causing a breach of personal health information.  Furthermore, I acknowledge that it is my responsibility to provide information about my medical history, conditions and care that is complete and accurate to the best of my ability. I acknowledge that Practitioner's advice, recommendations, and/or decision may be based on factors not within their control, such as incomplete or inaccurate data provided by me or distortions of diagnostic images or specimens that may result from electronic transmissions. I understand that the practice of medicine is not an exact science and that Practitioner makes no warranties or guarantees regarding treatment outcomes. I acknowledge that a copy of this consent can be made available to me via my patient portal ([facility]), or I can request a printed copy by calling the office of [facility].    I understand that my insurance will be billed for this visit.   I have read or had this consent read to me. I understand the contents of this consent, which adequately explains the benefits and risks of the Services being provided via telemedicine.  I have been provided ample opportunity to ask questions regarding this consent and the Services and have had my questions answered to my satisfaction. I give my informed consent for the services to be provided through the use of telemedicine in my medical care

## 2022-07-19 ENCOUNTER — Ambulatory Visit: Payer: No Typology Code available for payment source | Attending: Orthopedic Surgery | Admitting: Physician Assistant

## 2022-07-19 DIAGNOSIS — Z01810 Encounter for preprocedural cardiovascular examination: Secondary | ICD-10-CM | POA: Diagnosis not present

## 2022-07-19 NOTE — Progress Notes (Signed)
Virtual Visit via Telephone Note   Because of [name] co-morbid illnesses, she is at least at moderate risk for complications without adequate follow up.  This format is felt to be most appropriate for this patient at this time.  The patient did not have access to video technology/had technical difficulties with video requiring transitioning to audio format only (telephone).  All issues noted in this document were discussed and addressed.  No physical exam could be performed with this format.  Please refer to the patient's chart for her consent to telehealth for [facility].  Evaluation Performed:  Preoperative cardiovascular risk assessment _____________   Date:  07/19/2022   Patient ID:  [name], DOB Oct 02, 1961, MRN [id number] Patient Location:  Home Provider location:   Office  Primary Care Provider:  Ginger Organ., MD Primary Cardiologist:  Rozann Lesches, MD  Chief Complaint / Patient Profile   61 y.o. y/o female with a h/o LBBB, breast cancer status postchemotherapy with bilateral mastectomy, NYHA class I-II dyspnea at baseline, OSA with CPAP use who is pending left TKA and presents today for telephonic preoperative cardiovascular risk assessment.  Past Medical History    Past Medical History:  Diagnosis Date   Anxiety    Arthritis    Breast cancer ([place])    Status post bilateral mastectomy   Breast reconstruction deformity    Complication of anesthesia    shaking-after mastectomies-resp and hr dropped   Depression, major    suicidal thoughts -implants, mental health, 05/11   Fibromyalgia    Hypothyroidism    Migraine headache    Narcolepsy    OSA on CPAP    Sleep apnea with use of continuous positive airway pressure (CPAP)    AHI 20 in HST, titration to 6 cm water - residual AHi 6 . remaining hypersomnic.    Past Surgical History:  Procedure Laterality Date   ARTHRODESIS METATARSALPHALANGEAL JOINT (MTPJ) Right 07/28/2017    Procedure: ARTHRODESIS  RIGHT HALLUX METATARSALPHALANGEAL JOINT (MTPJ);  Surgeon: Wylene Simmer, MD;  Location: [place];  Service: Orthopedics;  Laterality: Right;   Breast reconstructioin  7/09   flaps-bilat mastectomies   BTL     CARPAL TUNNEL RELEASE  2011   both hands   CARPOMETACARPEL SUSPENSION PLASTY Right 08/29/2014   Procedure: REMOVAL DERMASPAN SUSPENSIONPLASTY RIGHT THUMB ABDUCTOR POLLIS TRANSFER;  Surgeon: Daryll Brod, MD;  Location: [place];  Service: Orthopedics;  Laterality: Right;   CATARACT EXTRACTION W/PHACO Right 12/17/2019   Procedure: CATARACT EXTRACTION PHACO AND INTRAOCULAR LENS PLACEMENT (IOC);  Surgeon: Baruch Goldmann, MD;  Location: [facility];  Service: Ophthalmology;  Laterality: Right;  CDE: 6.54   CATARACT EXTRACTION W/PHACO Left 12/31/2019   Procedure: CATARACT EXTRACTION PHACO AND INTRAOCULAR LENS PLACEMENT (IOC);  Surgeon: Baruch Goldmann, MD;  Location: [facility];  Service: Ophthalmology;  Laterality: Left;  CDE: 3.66   COLONOSCOPY W/ POLYPECTOMY  4/03   tubulovillous adenoma   FINGER ARTHROSCOPY WITH CARPOMETACARPEL ([place]) ARTHROPLASTY Right 06/20/2014   Procedure: ARTHROSCOPY RIGHT THUMB CARPOMETACARPEL GRAFT JACKET INTERPOSITION PARTIAL TRAPEZIECTOMY POSSIBLE OPEN;  Surgeon: Daryll Brod, MD;  Location: [place];  Service: Orthopedics;  Laterality: Right;   GANGLION CYST EXCISION     both [phone]   HAMMER TOE SURGERY Right 07/28/2017   Procedure: THIRD HAMMERTOE CORRECTION;  Surgeon: Wylene Simmer, MD;  Location: [place];  Service: Orthopedics;  Laterality: Right;   HARDWARE REMOVAL Right 07/28/2017   Procedure: REMOVAL  OF DEEP IMPLANTS;  Surgeon: Wylene Simmer, MD;  Location: [place];  Service: Orthopedics;  Laterality: Right;   hysterestomy  2005   MASTECTOMY Bilateral 2008   post- a -cath  2008   right knee arthroscopy     TOTAL SHOULDER ARTHROPLASTY Left 01/13/2017   Procedure: LEFT  TOTAL SHOULDER ARTHROPLASTY;  Surgeon: Tania Ade, MD;  Location: [place];  Service: Orthopedics;  Laterality: Left;  LEFT TOTAL SHOULDER ARTHROPLASTY   TOTAL SHOULDER ARTHROPLASTY Right 02/11/2022   Procedure: TOTAL SHOULDER ARTHROPLASTY;  Surgeon: Nicholes Stairs, MD;  Location: [place];  Service: Orthopedics;  Laterality: Right;  150   TRIGGER FINGER RELEASE Right 08/29/2014   Procedure: RELEASE TRIGGER FINGER/A-1 PULLEY;  Surgeon: Daryll Brod, MD;  Location: [place];  Service: Orthopedics;  Laterality: Right;   wisdom teeth out      Allergies  Allergies  Allergen Reactions   Sulfonamide Derivatives Hives and Other (See Comments)    FEVER    History of Present Illness    [name] is a 61 y.o. female who presents via audio/video conferencing for a telehealth visit today.  Pt was last seen in cardiology clinic on 02/10/22 by Dr. Domenic Polite.  At that time JERENE YEAGER was doing well previously cleared for right shoulder arthroplasty.  The patient is now pending procedure as outlined above. Since her last visit, she states that she has been feeling perfect as far as her heart goes with no issues.  She denies shortness of breath, chest pain, or palpitations.  She is thinking her surgery will be mid October since she has to wait 90 days from her last cortisone injection in her knee.  She can walk 1-2 blocks on level ground without issues as well as taking stairs.  She is able to swim as well as do some yard work.  For this reason she scored a 6.55 METS on the DASI.  This exceeds the 4 METS minimum requirement.  No medications indicated as needing to be held.  Home Medications    Prior to Admission medications   Medication Sig Start Date End Date Taking? Authorizing Provider  ARIPiprazole (ABILIFY) 20 MG tablet Take 20 mg by mouth daily. 07/10/19   [provider]  benztropine (COGENTIN) 1 MG tablet Take 1 mg by mouth at bedtime.  08/25/16   [provider]  busPIRone (BUSPAR) 10 MG tablet Take 10 mg by mouth 3 (three) times daily as needed.    [provider]  cyclobenzaprine (FLEXERIL) 5 MG tablet Take 5 mg by mouth 3 (three) times daily as needed for muscle spasms. 09/15/21   [provider]  diclofenac sodium (VOLTAREN) 1 % GEL Apply 1 application topically 4 (four) times daily as needed (pain.).     [provider]  escitalopram (LEXAPRO) 20 MG tablet Take by mouth in the morning and at bedtime.    [provider]  LINZESS 145 MCG CAPS capsule Take 145 mcg by mouth daily as needed for constipation. Patient not taking: Reported on 07/09/2022 01/28/22   [provider]  magnesium oxide (MAG-OX) 400 (240 Mg) MG tablet Take 400 mg by mouth as needed.    [provider]  Melatonin 5 MG TABS Take 5 mg by mouth at bedtime.    [provider]  methocarbamol (ROBAXIN) 500 MG tablet Take 500 mg by mouth 4 (four) times daily as needed for muscle spasms.    [provider]  methocarbamol (ROBAXIN) 500 MG tablet Take 1 tablet (500 mg total) by mouth 4 (four) times daily. Patient not taking: Reported on 07/09/2022 02/12/22   Jonelle Sidle D, PA  methylphenidate (RITALIN LA) 30 MG 24 hr capsule Take 30 mg by mouth in the morning. 09/06/16   [provider]  Multiple Vitamin (MULTIVITAMIN ADULT PO) Take 1 tablet by mouth daily. MULTI HAIR, SKIN AND NAILS    [provider]  Naproxen (NAPROSYN PO) Take 220 mg by mouth 2 (two) times daily as needed.    [provider]  ondansetron (ZOFRAN) 4 MG tablet Take 1 tablet (4 mg total) by mouth every 8 (eight) hours as needed for nausea or vomiting. 02/12/22 02/12/23  Jonelle Sidle D, PA  oxyCODONE-acetaminophen (PERCOCET) 5-325 MG tablet Take 1 tablet by mouth every 4 (four) hours as needed for severe pain. 02/12/22 02/12/23  McClung, Achilles Dunk D, PA  OZEMPIC, 1 MG/DOSE, 2 MG/1.5ML SOPN Inject 1 mg into the skin every Sunday.   Patient not taking: Reported on 07/09/2022 11/18/19   [provider]  pregabalin (LYRICA) 150 MG capsule Take 150 mg by mouth 2 (two) times daily.    [provider]  propranolol ER (INDERAL LA) 120 MG 24 hr capsule Take 120 mg by mouth 2 (two) times daily.  08/08/19   [provider]  Tapentadol HCl 150 MG TB12 Take 150 mg by mouth 2 (two) times daily. Patient not taking: Reported on 07/09/2022 06/18/19   [provider]  traMADol (ULTRAM) 50 MG tablet Take 50 mg by mouth every 6 (six) hours as needed for moderate pain. Patient not taking: Reported on 07/09/2022    [provider]    Physical Exam    Vital Signs:  SHAUNIECE KWAN does not have vital signs available for review today.  Given telephonic nature of communication, physical exam is limited. AAOx3. NAD. Normal affect.  Speech and respirations are unlabored.  Accessory Clinical Findings    None  Assessment & Plan    1.  Preoperative Cardiovascular Risk Assessment:  Ms. Sorber perioperative risk of a major cardiac event is 0.4% according to the Revised Cardiac Risk Index (RCRI).  Therefore, she is at low risk for perioperative complications.   Her functional capacity is good at 6.55 METs according to the [employer] Activity Status Index (DASI). Recommendations: According to ACC/AHA guidelines, no further cardiovascular testing needed.  The patient may proceed to surgery at acceptable risk.    A copy of this note will be routed to requesting surgeon.  Time:   Today, I have spent 10 minutes with the patient with telehealth technology discussing medical history, symptoms, and management plan.     Elgie Collard, PA-C  07/19/2022, 1:29 PM

## 2022-07-22 ENCOUNTER — Ambulatory Visit (INDEPENDENT_AMBULATORY_CARE_PROVIDER_SITE_OTHER): Payer: Medicare Other | Admitting: Family Medicine

## 2022-07-27 ENCOUNTER — Encounter (INDEPENDENT_AMBULATORY_CARE_PROVIDER_SITE_OTHER): Payer: Self-pay | Admitting: Family Medicine

## 2022-07-27 ENCOUNTER — Ambulatory Visit (INDEPENDENT_AMBULATORY_CARE_PROVIDER_SITE_OTHER): Payer: No Typology Code available for payment source | Admitting: Family Medicine

## 2022-07-27 VITALS — BP 107/64 | HR 68 | Temp 98.4°F | Ht 64.0 in | Wt 165.0 lb

## 2022-07-27 DIAGNOSIS — E663 Overweight: Secondary | ICD-10-CM

## 2022-07-27 DIAGNOSIS — E669 Obesity, unspecified: Secondary | ICD-10-CM

## 2022-07-27 DIAGNOSIS — R0602 Shortness of breath: Secondary | ICD-10-CM

## 2022-07-27 DIAGNOSIS — Z6828 Body mass index (BMI) 28.0-28.9, adult: Secondary | ICD-10-CM

## 2022-08-03 NOTE — Progress Notes (Unsigned)
Chief Complaint:   OBESITY [name] is here to discuss her treatment plan along and for more counseling and education of food makeup and creating more nutritious meals.  [name] is on keeping a food journal and adhering to recommended goals of 1300-1400 calories and 85g protein.    Subjective:   1. SOBOE (shortness of breath on exertion) [name] states she has significant shortness of breath when performing activities.  That, along with chronic pain, has limited her in pursuit of exercise and made it difficult to perform even daily activities.   2. Obesity with serious comorbidity [name] relays she has struggled with her weight for quite some time.  She was previously prescribed a GLP but this was no longer being covered and now she feels stuck at her current weight.  She is frustrated and says she needs guidance on both nutrition and healthy planning.  Due to chronic pain she is on many medications.   Assessment/Plan:   1. SOBOE (shortness of breath on exertion) IC was performed today.  Patient was explained that resting metabolism was 1598.  Due to her other medical issues she is not as active as she would like to be so will assume activity calories will be on the lower end of daily estimation.  2. Obesity with serious comorbidity, unspecified classification, unspecified obesity type RMR of 1598.  Discussed in detail journaling with a calorie goal of 1300-1400 calories and 85 or more grams of protein daily.  We discussed at length the importance of protein in a daily diet as well as how to limit over intake of carbohydrate.  Specifically the 10 calorie:1g of Protein ratio was explained and handouts on protein properties of foods were given.  Photographic examples of protein, carbohydrate and fat intake of a meal were shown.  Macronutrient daily composition and ratio were also discussed at length.  [name] is currently in the action stage of change. As such, her goal is to continue with weight  loss efforts. She has agreed to keeping a food journal and adhering to recommended goals of 1300-1400 calories and 85 or more grams of protein daily.   Exercise goals: Discussed the importance of exercise implementation and that weekly goal of 150-300 minutes weekely of moderate to vigorous activity is necessary to maintain weight loss.   Objective:   Blood pressure 107/64, pulse 68, temperature 98.4 F (36.9 C), height '5\' 4"'$  (1.626 m), weight 165 lb (74.8 kg), SpO2 95 %. Body mass index is 28.32 kg/m.  General: Cooperative, alert, well developed, in no acute distress. HEENT: Conjunctivae and lids unremarkable. Cardiovascular: Regular rhythm.  Lungs: Normal work of breathing. Neurologic: No focal deficits.   Lab Results  Component Value Date   CREATININE 0.94 02/08/2022   BUN 19 02/08/2022   NA 139 02/08/2022   K 3.8 02/08/2022   CL 100 02/08/2022   CO2 29 02/08/2022   Lab Results  Component Value Date   ALT 25 01/07/2017   AST 26 01/07/2017   ALKPHOS 72 01/07/2017   BILITOT 0.4 01/07/2017   Lab Results  Component Value Date   HGBA1C 5.5 02/12/2022   No results found for: "INSULIN" Lab Results  Component Value Date   TSH 2.448 10/11/2011   No results found for: "CHOL", "HDL", "LDLCALC", "LDLDIRECT", "TRIG", "CHOLHDL" Lab Results  Component Value Date   VD25OH 45 07/19/2007   Lab Results  Component Value Date   WBC 8.7 02/08/2022   HGB 14.0 02/08/2022   HCT 42.8  02/08/2022   MCV 86.1 02/08/2022   PLT 282 02/08/2022   No results found for: "IRON", "TIBC", "FERRITIN"  Attestation Statements:   Reviewed by clinician on day of visit: allergies, medications, problem list, medical history, surgical history, family history, social history, and previous encounter notes.  I have reviewed the above documentation for accuracy and completeness, and I agree with the above. - Coralie Common, MD

## 2022-08-09 ENCOUNTER — Other Ambulatory Visit (HOSPITAL_COMMUNITY): Payer: Self-pay

## 2022-08-09 MED ORDER — WEGOVY 0.5 MG/0.5ML ~~LOC~~ SOAJ
0.5000 mg | SUBCUTANEOUS | 0 refills | Status: DC
  Filled 2022-08-09: qty 2, 28d supply, fill #0

## 2022-08-11 ENCOUNTER — Other Ambulatory Visit (HOSPITAL_COMMUNITY): Payer: Self-pay

## 2022-08-17 ENCOUNTER — Ambulatory Visit: Payer: Self-pay | Admitting: Student

## 2022-08-23 ENCOUNTER — Other Ambulatory Visit (HOSPITAL_COMMUNITY): Payer: Self-pay

## 2022-08-30 ENCOUNTER — Ambulatory Visit: Payer: Self-pay | Admitting: Student

## 2022-08-30 NOTE — H&P (View-Only) (Signed)
TOTAL KNEE ADMISSION H&P  Patient is being admitted for left total knee arthroplasty.  Subjective:  Chief Complaint:left knee pain.  HPI: [name], 61 y.o. female, has a history of pain and functional disability in the left knee due to arthritis and has failed non-surgical conservative treatments for greater than 12 weeks to includeNSAID's and/or analgesics, corticosteriod injections, flexibility and strengthening excercises, use of assistive devices, and activity modification.  Onset of symptoms was gradual, starting 10 years ago with rapidlly worsening course since that time. The patient noted no past surgery on the left knee(s).  Patient currently rates pain in the left knee(s) at 10 out of 10 with activity. Patient has night pain, worsening of pain with activity and weight bearing, pain that interferes with activities of daily living, pain with passive range of motion, crepitus, and joint swelling.  Patient has evidence of subchondral cysts, subchondral sclerosis, periarticular osteophytes, and joint space narrowing by imaging studies. There is no active infection.  Patient Active Problem List   Diagnosis Date Noted   History of total shoulder replacement, right 02/11/2022   Osteoarthritis of left shoulder 01/13/2017   S/P shoulder replacement, left 01/13/2017   OSA on CPAP 04/01/2015   Subacute confusional state 04/01/2015   Hallucinations, visual 04/01/2015   Hypnapompic hallucinations 04/01/2015   Sleep apnea with use of continuous positive airway pressure (CPAP)    Hypersomnia, persistent 03/30/2013   Obstructive sleep apnea (adult) (pediatric) 03/30/2013   CONSTIPATION 11/18/2008   RECTAL BLEEDING 11/18/2008   HEMORRHOIDS, INTERNAL 11/15/2008   DIVERTICULOSIS, COLON 11/15/2008   CELLULITIS/ABSCESS, TRUNK 02/22/2008   LOW BACK PAIN 02/22/2008   DIZZINESS 02/22/2008   FATIGUE 02/22/2008   PALPITATIONS 02/22/2008   MURMUR, CARDIAC, UNDIAGNOSED 02/22/2008   BREAST CANCER,  HX OF 02/22/2008   COUGH 01/05/2008   DEPRESSION 07/05/2007   UTERINE POLYP 07/05/2007   FIBROMYALGIA 07/05/2007   COLONIC POLYPS, HX OF 07/05/2007   HX, PERSONAL, CERVICAL DYSPLASIA 07/05/2007   Past Medical History:  Diagnosis Date   Anxiety    Arthritis    Breast cancer ([place])    Status post bilateral mastectomy   Breast reconstruction deformity    Complication of anesthesia    shaking-after mastectomies-resp and hr dropped   Depression, major    suicidal thoughts -implants, mental health, 05/11   Fibromyalgia    Hypothyroidism    Migraine headache    Narcolepsy    OSA on CPAP    Sleep apnea with use of continuous positive airway pressure (CPAP)    AHI 20 in HST, titration to 6 cm water - residual AHi 6 . remaining hypersomnic.     Past Surgical History:  Procedure Laterality Date   ARTHRODESIS METATARSALPHALANGEAL JOINT (MTPJ) Right 07/28/2017   Procedure: ARTHRODESIS  RIGHT HALLUX METATARSALPHALANGEAL JOINT (MTPJ);  Surgeon: Wylene Simmer, MD;  Location: [facility];  Service: Orthopedics;  Laterality: Right;   Breast reconstructioin  7/09   flaps-bilat mastectomies   BTL     CARPAL TUNNEL RELEASE  2011   both hands   CARPOMETACARPEL SUSPENSION PLASTY Right 08/29/2014   Procedure: REMOVAL DERMASPAN SUSPENSIONPLASTY RIGHT THUMB ABDUCTOR POLLIS TRANSFER;  Surgeon: Daryll Brod, MD;  Location: [place];  Service: Orthopedics;  Laterality: Right;   CATARACT EXTRACTION W/PHACO Right 12/17/2019   Procedure: CATARACT EXTRACTION PHACO AND INTRAOCULAR LENS PLACEMENT (IOC);  Surgeon: Baruch Goldmann, MD;  Location: [facility];  Service: Ophthalmology;  Laterality: Right;  CDE: 6.54   CATARACT EXTRACTION W/PHACO Left 12/31/2019  Procedure: CATARACT EXTRACTION PHACO AND INTRAOCULAR LENS PLACEMENT (IOC);  Surgeon: Baruch Goldmann, MD;  Location: [facility];  Service: Ophthalmology;  Laterality: Left;  CDE: 3.66   COLONOSCOPY W/ POLYPECTOMY  4/03   tubulovillous  adenoma   FINGER ARTHROSCOPY WITH CARPOMETACARPEL ([place]) ARTHROPLASTY Right 06/20/2014   Procedure: ARTHROSCOPY RIGHT THUMB CARPOMETACARPEL GRAFT JACKET INTERPOSITION PARTIAL TRAPEZIECTOMY POSSIBLE OPEN;  Surgeon: Daryll Brod, MD;  Location: [place];  Service: Orthopedics;  Laterality: Right;   GANGLION CYST EXCISION     both [phone]   HAMMER TOE SURGERY Right 07/28/2017   Procedure: THIRD HAMMERTOE CORRECTION;  Surgeon: Wylene Simmer, MD;  Location: [place];  Service: Orthopedics;  Laterality: Right;   HARDWARE REMOVAL Right 07/28/2017   Procedure: REMOVAL OF DEEP IMPLANTS;  Surgeon: Wylene Simmer, MD;  Location: [facility];  Service: Orthopedics;  Laterality: Right;   hysterestomy  2005   MASTECTOMY Bilateral 2008   post- a -cath  2008   right knee arthroscopy     TOTAL SHOULDER ARTHROPLASTY Left 01/13/2017   Procedure: LEFT TOTAL SHOULDER ARTHROPLASTY;  Surgeon: Tania Ade, MD;  Location: [facility];  Service: Orthopedics;  Laterality: Left;  LEFT TOTAL SHOULDER ARTHROPLASTY   TOTAL SHOULDER ARTHROPLASTY Right 02/11/2022   Procedure: TOTAL SHOULDER ARTHROPLASTY;  Surgeon: Nicholes Stairs, MD;  Location: [place];  Service: Orthopedics;  Laterality: Right;  150   TRIGGER FINGER RELEASE Right 08/29/2014   Procedure: RELEASE TRIGGER FINGER/A-1 PULLEY;  Surgeon: Daryll Brod, MD;  Location: [facility];  Service: Orthopedics;  Laterality: Right;   wisdom teeth out      Current Outpatient Medications  Medication Sig Dispense Refill Last Dose   ARIPiprazole (ABILIFY) 20 MG tablet Take 20 mg by mouth daily.      benztropine (COGENTIN) 1 MG tablet Take 1 mg by mouth at bedtime.       busPIRone (BUSPAR) 10 MG tablet Take 10 mg by mouth 3 (three) times daily as needed.      diclofenac sodium (VOLTAREN) 1 % GEL Apply 1 application topically 4 (four) times daily as needed (pain.).       escitalopram (LEXAPRO) 20 MG tablet Take by mouth in the  morning and at bedtime.      magnesium oxide (MAG-OX) 400 (240 Mg) MG tablet Take 400 mg by mouth as needed.      Melatonin 5 MG TABS Take 5 mg by mouth at bedtime.      methocarbamol (ROBAXIN) 500 MG tablet Take 1 tablet (500 mg total) by mouth 4 (four) times daily. 40 tablet 0    methylphenidate (RITALIN LA) 30 MG 24 hr capsule Take 30 mg by mouth in the morning.      Multiple Vitamin (MULTIVITAMIN ADULT PO) Take 1 tablet by mouth daily. MULTI HAIR, SKIN AND NAILS      Naproxen (NAPROSYN PO) Take 220 mg by mouth 2 (two) times daily as needed.      ondansetron (ZOFRAN) 4 MG tablet Take 1 tablet (4 mg total) by mouth every 8 (eight) hours as needed for nausea or vomiting. 15 tablet 1    pregabalin (LYRICA) 150 MG capsule Take 150 mg by mouth 2 (two) times daily.      propranolol ER (INDERAL LA) 120 MG 24 hr capsule Take 120 mg by mouth 2 (two) times daily.       Semaglutide-Weight Management (WEGOVY) 0.5 MG/0.5ML SOAJ Inject 0.5 mg into the skin once a week. 2 mL  0    No current facility-administered medications for this visit.   Allergies  Allergen Reactions   Sulfonamide Derivatives Hives and Other (See Comments)    FEVER    Social History   Tobacco Use   Smoking status: Former    Packs/day: 0.25    Years: 15.00    Total pack years: 3.75    Types: Cigarettes    Quit date: 06/14/2012    Years since quitting: 10.2   Smokeless tobacco: Never   Tobacco comments:    quit smoking in 1999  Substance Use Topics   Alcohol use: Not Currently    Comment: glass wine or beer "every once in a while" per pt.    Family History  Problem Relation Age of Onset   Breast cancer Mother    Prostate cancer Father    Breast cancer Maternal Aunt        breast   Colon cancer Maternal Grandmother        age unknown, ? 41's   Breast cancer Maternal Grandmother    Colon cancer Cousin    Healthy Brother    Healthy Son    Healthy Daughter    Healthy Daughter    Leukemia Granddaughter      Review  of Systems  Musculoskeletal:  Positive for arthralgias, gait problem and joint swelling.  All other systems reviewed and are negative.   Objective:  Physical Exam Constitutional:      Appearance: Normal appearance.  HENT:     Head: Normocephalic and atraumatic.     Nose: Nose normal.     Mouth/Throat:     Mouth: Mucous membranes are moist.     Pharynx: Oropharynx is clear.  Eyes:     Conjunctiva/sclera: Conjunctivae normal.  Cardiovascular:     Rate and Rhythm: Normal rate and regular rhythm.     Pulses: Normal pulses.     Heart sounds: Normal heart sounds.  Pulmonary:     Effort: Pulmonary effort is normal.     Breath sounds: Normal breath sounds.  Abdominal:     General: Abdomen is flat.     Palpations: Abdomen is soft.  Genitourinary:    Comments: deferred Musculoskeletal:     Cervical back: Normal range of motion and neck supple.     Comments: Examination of the left knee reveals no skin wounds or lesions. Varus deformity. There is swelling. No warmth, erythema. Positive effusion. Tenderness to palpation medial joint line and peripatellar retinacular tissues with positive grind side. Range of motion 5 to 110 degrees without any instability. Painless range of motion of the hip.  Neurovascularly intact distally.  Skin:    General: Skin is warm.     Capillary Refill: Capillary refill takes less than 2 seconds.  Neurological:     General: No focal deficit present.     Mental Status: She is alert and oriented to person, place, and time.  Psychiatric:        Mood and Affect: Mood normal.        Behavior: Behavior normal.        Thought Content: Thought content normal.        Judgment: Judgment normal.     Vital signs in last 24 hours: '@VSRANGES'$ @  Labs:   Estimated body mass index is 28.32 kg/m as calculated from the following:   Height as of 07/27/22: '5\' 4"'$  (1.626 m).   Weight as of 07/27/22: 74.8 kg.   Imaging Review Plain radiographs demonstrate severe  degenerative joint disease of the left knee(s). The overall alignment issignificant varus. The bone quality appears to be adequate for age and reported activity level.      Assessment/Plan:  End stage arthritis, left knee   The patient history, physical examination, clinical judgment of the provider and imaging studies are consistent with end stage degenerative joint disease of the left knee(s) and total knee arthroplasty is deemed medically necessary. The treatment options including medical management, injection therapy arthroscopy and arthroplasty were discussed at length. The risks and benefits of total knee arthroplasty were presented and reviewed. The risks due to aseptic loosening, infection, stiffness, patella tracking problems, thromboembolic complications and other imponderables were discussed. The patient acknowledged the explanation, agreed to proceed with the plan and consent was signed. Patient is being admitted for inpatient treatment for surgery, pain control, PT, OT, prophylactic antibiotics, VTE prophylaxis, progressive ambulation and ADL's and discharge planning. The patient is planning to be discharged home after an overnight stay.   Therapy Plans: outpatient therapy. [facility] 09/20/22.  Disposition: Home with husband Planned DVT Prophylaxis: aspirin '81mg'$  BID DME needed: None. Has rolling walker.  PCP: Cleared Cardiology: Cleared TXA: IV Allergies:  - Sulfa - hives, fever Anesthesia Concerns: Issues with nausea in the past.  BMI: 30.4 Last HgbA1c: 5.5 Other: - Chronic pain syndrome, narcotics. On morphine '30mg'$  BID. Doesn't tolerate methocarbamol, naproxen, and morphine together.** - Skip ozempic injection 09/07/22 and 09/13/22. - Oxycodone, zofran.  - Cr 0.8, Hgb 13.0.      Patient's anticipated LOS is less than 2 midnights, meeting these requirements: - Younger than 29 - Lives within 1 hour of care - Has a competent adult at home to recover with  post-op recover - NO history of  - Diabetes  - Coronary Artery Disease  - Heart failure  - Heart attack  - Stroke  - DVT/VTE  - Cardiac arrhythmia  - Respiratory Failure/COPD  - Renal failure  - Anemia  - Advanced Liver disease

## 2022-08-30 NOTE — H&P (Signed)
TOTAL KNEE ADMISSION H&P  Patient is being admitted for left total knee arthroplasty.  Subjective:  Chief Complaint:left knee pain.  HPI: [name], 61 y.o. female, has a history of pain and functional disability in the left knee due to arthritis and has failed non-surgical conservative treatments for greater than 12 weeks to includeNSAID's and/or analgesics, corticosteriod injections, flexibility and strengthening excercises, use of assistive devices, and activity modification.  Onset of symptoms was gradual, starting 10 years ago with rapidlly worsening course since that time. The patient noted no past surgery on the left knee(s).  Patient currently rates pain in the left knee(s) at 10 out of 10 with activity. Patient has night pain, worsening of pain with activity and weight bearing, pain that interferes with activities of daily living, pain with passive range of motion, crepitus, and joint swelling.  Patient has evidence of subchondral cysts, subchondral sclerosis, periarticular osteophytes, and joint space narrowing by imaging studies. There is no active infection.  Patient Active Problem List   Diagnosis Date Noted   History of total shoulder replacement, right 02/11/2022   Osteoarthritis of left shoulder 01/13/2017   S/P shoulder replacement, left 01/13/2017   OSA on CPAP 04/01/2015   Subacute confusional state 04/01/2015   Hallucinations, visual 04/01/2015   Hypnapompic hallucinations 04/01/2015   Sleep apnea with use of continuous positive airway pressure (CPAP)    Hypersomnia, persistent 03/30/2013   Obstructive sleep apnea (adult) (pediatric) 03/30/2013   CONSTIPATION 11/18/2008   RECTAL BLEEDING 11/18/2008   HEMORRHOIDS, INTERNAL 11/15/2008   DIVERTICULOSIS, COLON 11/15/2008   CELLULITIS/ABSCESS, TRUNK 02/22/2008   LOW BACK PAIN 02/22/2008   DIZZINESS 02/22/2008   FATIGUE 02/22/2008   PALPITATIONS 02/22/2008   MURMUR, CARDIAC, UNDIAGNOSED 02/22/2008   BREAST CANCER,  HX OF 02/22/2008   COUGH 01/05/2008   DEPRESSION 07/05/2007   UTERINE POLYP 07/05/2007   FIBROMYALGIA 07/05/2007   COLONIC POLYPS, HX OF 07/05/2007   HX, PERSONAL, CERVICAL DYSPLASIA 07/05/2007   Past Medical History:  Diagnosis Date   Anxiety    Arthritis    Breast cancer ([place])    Status post bilateral mastectomy   Breast reconstruction deformity    Complication of anesthesia    shaking-after mastectomies-resp and hr dropped   Depression, major    suicidal thoughts -implants, mental health, 05/11   Fibromyalgia    Hypothyroidism    Migraine headache    Narcolepsy    OSA on CPAP    Sleep apnea with use of continuous positive airway pressure (CPAP)    AHI 20 in HST, titration to 6 cm water - residual AHi 6 . remaining hypersomnic.     Past Surgical History:  Procedure Laterality Date   ARTHRODESIS METATARSALPHALANGEAL JOINT (MTPJ) Right 07/28/2017   Procedure: ARTHRODESIS  RIGHT HALLUX METATARSALPHALANGEAL JOINT (MTPJ);  Surgeon: Wylene Simmer, MD;  Location: [facility];  Service: Orthopedics;  Laterality: Right;   Breast reconstructioin  7/09   flaps-bilat mastectomies   BTL     CARPAL TUNNEL RELEASE  2011   both hands   CARPOMETACARPEL SUSPENSION PLASTY Right 08/29/2014   Procedure: REMOVAL DERMASPAN SUSPENSIONPLASTY RIGHT THUMB ABDUCTOR POLLIS TRANSFER;  Surgeon: Daryll Brod, MD;  Location: [facility];  Service: Orthopedics;  Laterality: Right;   CATARACT EXTRACTION W/PHACO Right 12/17/2019   Procedure: CATARACT EXTRACTION PHACO AND INTRAOCULAR LENS PLACEMENT (IOC);  Surgeon: Baruch Goldmann, MD;  [facility];  Service: Ophthalmology;  Laterality: Right;  CDE: 6.54   CATARACT EXTRACTION W/PHACO Left 12/31/2019  Procedure: CATARACT EXTRACTION PHACO AND INTRAOCULAR LENS PLACEMENT (IOC);  Surgeon: Baruch Goldmann, MD;  [facility];  Service: Ophthalmology;  Laterality: Left;  CDE: 3.66   COLONOSCOPY W/ POLYPECTOMY  4/03   tubulovillous  adenoma   FINGER ARTHROSCOPY WITH CARPOMETACARPEL ([place]) ARTHROPLASTY Right 06/20/2014   Procedure: ARTHROSCOPY RIGHT THUMB CARPOMETACARPEL GRAFT JACKET INTERPOSITION PARTIAL TRAPEZIECTOMY POSSIBLE OPEN;  Surgeon: Daryll Brod, MD;  Location: [facility];  Service: Orthopedics;  Laterality: Right;   GANGLION CYST EXCISION     both [phone]   HAMMER TOE SURGERY Right 07/28/2017   Procedure: THIRD HAMMERTOE CORRECTION;  Surgeon: Wylene Simmer, MD;  Location: [place];  Service: Orthopedics;  Laterality: Right;   HARDWARE REMOVAL Right 07/28/2017   Procedure: REMOVAL OF DEEP IMPLANTS;  Surgeon: Wylene Simmer, MD;  Location: [facility];  Service: Orthopedics;  Laterality: Right;   hysterestomy  2005   MASTECTOMY Bilateral 2008   post- a -cath  2008   right knee arthroscopy     TOTAL SHOULDER ARTHROPLASTY Left 01/13/2017   Procedure: LEFT TOTAL SHOULDER ARTHROPLASTY;  Surgeon: Tania Ade, MD;  Location: [place];  Service: Orthopedics;  Laterality: Left;  LEFT TOTAL SHOULDER ARTHROPLASTY   TOTAL SHOULDER ARTHROPLASTY Right 02/11/2022   Procedure: TOTAL SHOULDER ARTHROPLASTY;  Surgeon: Nicholes Stairs, MD;  Location: [place];  Service: Orthopedics;  Laterality: Right;  150   TRIGGER FINGER RELEASE Right 08/29/2014   Procedure: RELEASE TRIGGER FINGER/A-1 PULLEY;  Surgeon: Daryll Brod, MD;  Location: [facility];  Service: Orthopedics;  Laterality: Right;   wisdom teeth out      Current Outpatient Medications  Medication Sig Dispense Refill Last Dose   ARIPiprazole (ABILIFY) 20 MG tablet Take 20 mg by mouth daily.      benztropine (COGENTIN) 1 MG tablet Take 1 mg by mouth at bedtime.       busPIRone (BUSPAR) 10 MG tablet Take 10 mg by mouth 3 (three) times daily as needed.      diclofenac sodium (VOLTAREN) 1 % GEL Apply 1 application topically 4 (four) times daily as needed (pain.).       escitalopram (LEXAPRO) 20 MG tablet Take by mouth in the  morning and at bedtime.      magnesium oxide (MAG-OX) 400 (240 Mg) MG tablet Take 400 mg by mouth as needed.      Melatonin 5 MG TABS Take 5 mg by mouth at bedtime.      methocarbamol (ROBAXIN) 500 MG tablet Take 1 tablet (500 mg total) by mouth 4 (four) times daily. 40 tablet 0    methylphenidate (RITALIN LA) 30 MG 24 hr capsule Take 30 mg by mouth in the morning.      Multiple Vitamin (MULTIVITAMIN ADULT PO) Take 1 tablet by mouth daily. MULTI HAIR, SKIN AND NAILS      Naproxen (NAPROSYN PO) Take 220 mg by mouth 2 (two) times daily as needed.      ondansetron (ZOFRAN) 4 MG tablet Take 1 tablet (4 mg total) by mouth every 8 (eight) hours as needed for nausea or vomiting. 15 tablet 1    pregabalin (LYRICA) 150 MG capsule Take 150 mg by mouth 2 (two) times daily.      propranolol ER (INDERAL LA) 120 MG 24 hr capsule Take 120 mg by mouth 2 (two) times daily.       Semaglutide-Weight Management (WEGOVY) 0.5 MG/0.5ML SOAJ Inject 0.5 mg into the skin once a week. 2 mL  0    No current facility-administered medications for this visit.   Allergies  Allergen Reactions   Sulfonamide Derivatives Hives and Other (See Comments)    FEVER    Social History   Tobacco Use   Smoking status: Former    Packs/day: 0.25    Years: 15.00    Total pack years: 3.75    Types: Cigarettes    Quit date: 06/14/2012    Years since quitting: 10.2   Smokeless tobacco: Never   Tobacco comments:    quit smoking in 1999  Substance Use Topics   Alcohol use: Not Currently    Comment: glass wine or beer "every once in a while" per pt.    Family History  Problem Relation Age of Onset   Breast cancer Mother    Prostate cancer Father    Breast cancer Maternal Aunt        breast   Colon cancer Maternal Grandmother        age unknown, ? 70's   Breast cancer Maternal Grandmother    Colon cancer Cousin    Healthy Brother    Healthy Son    Healthy Daughter    Healthy Daughter    Leukemia Granddaughter      Review  of Systems  Musculoskeletal:  Positive for arthralgias, gait problem and joint swelling.  All other systems reviewed and are negative.   Objective:  Physical Exam Constitutional:      Appearance: Normal appearance.  HENT:     Head: Normocephalic and atraumatic.     Nose: Nose normal.     Mouth/Throat:     Mouth: Mucous membranes are moist.     Pharynx: Oropharynx is clear.  Eyes:     Conjunctiva/sclera: Conjunctivae normal.  Cardiovascular:     Rate and Rhythm: Normal rate and regular rhythm.     Pulses: Normal pulses.     Heart sounds: Normal heart sounds.  Pulmonary:     Effort: Pulmonary effort is normal.     Breath sounds: Normal breath sounds.  Abdominal:     General: Abdomen is flat.     Palpations: Abdomen is soft.  Genitourinary:    Comments: deferred Musculoskeletal:     Cervical back: Normal range of motion and neck supple.     Comments: Examination of the left knee reveals no skin wounds or lesions. Varus deformity. There is swelling. No warmth, erythema. Positive effusion. Tenderness to palpation medial joint line and peripatellar retinacular tissues with positive grind side. Range of motion 5 to 110 degrees without any instability. Painless range of motion of the hip.  Neurovascularly intact distally.  Skin:    General: Skin is warm.     Capillary Refill: Capillary refill takes less than 2 seconds.  Neurological:     General: No focal deficit present.     Mental Status: She is alert and oriented to person, place, and time.  Psychiatric:        Mood and Affect: Mood normal.        Behavior: Behavior normal.        Thought Content: Thought content normal.        Judgment: Judgment normal.     Vital signs in last 24 hours: '@VSRANGES'$ @  Labs:   Estimated body mass index is 28.32 kg/m as calculated from the following:   Height as of 07/27/22: '5\' 4"'$  (1.626 m).   Weight as of 07/27/22: 74.8 kg.   Imaging Review Plain radiographs demonstrate severe  degenerative joint disease of the left knee(s). The overall alignment issignificant varus. The bone quality appears to be adequate for age and reported activity level.      Assessment/Plan:  End stage arthritis, left knee   The patient history, physical examination, clinical judgment of the provider and imaging studies are consistent with end stage degenerative joint disease of the left knee(s) and total knee arthroplasty is deemed medically necessary. The treatment options including medical management, injection therapy arthroscopy and arthroplasty were discussed at length. The risks and benefits of total knee arthroplasty were presented and reviewed. The risks due to aseptic loosening, infection, stiffness, patella tracking problems, thromboembolic complications and other imponderables were discussed. The patient acknowledged the explanation, agreed to proceed with the plan and consent was signed. Patient is being admitted for inpatient treatment for surgery, pain control, PT, OT, prophylactic antibiotics, VTE prophylaxis, progressive ambulation and ADL's and discharge planning. The patient is planning to be discharged home after an overnight stay.   Therapy Plans: outpatient therapy. [facility] 09/20/22.  Disposition: Home with husband Planned DVT Prophylaxis: aspirin '81mg'$  BID DME needed: None. Has rolling walker.  PCP: Cleared Cardiology: Cleared TXA: IV Allergies:  - Sulfa - hives, fever Anesthesia Concerns: Issues with nausea in the past.  BMI: 30.4 Last HgbA1c: 5.5 Other: - Chronic pain syndrome, narcotics. On morphine '30mg'$  BID. Doesn't tolerate methocarbamol, naproxen, and morphine together.** - Skip ozempic injection 09/07/22 and 09/13/22. - Oxycodone, zofran.  - Cr 0.8, Hgb 13.0.      Patient's anticipated LOS is less than 2 midnights, meeting these requirements: - Younger than 63 - Lives within 1 hour of care - Has a competent adult at home to recover with  post-op recover - NO history of  - Diabetes  - Coronary Artery Disease  - Heart failure  - Heart attack  - Stroke  - DVT/VTE  - Cardiac arrhythmia  - Respiratory Failure/COPD  - Renal failure  - Anemia  - Advanced Liver disease

## 2022-08-31 NOTE — Patient Instructions (Signed)
SURGICAL WAITING ROOM VISITATION Patients having surgery or a procedure may have no more than 2 support people in the waiting area - these visitors may rotate.   Children under the age of 89 must have an adult with them who is not the patient. If the patient needs to stay at the hospital during part of their recovery, the visitor guidelines for inpatient rooms apply. Pre-op nurse will coordinate an appropriate time for 1 support person to accompany patient in pre-op.  This support person may not rotate.    Please refer to the [facility] website for the visitor guidelines for Inpatients (after your surgery is over and you are in a regular room).       Your procedure is scheduled on:  09/15/2022    Report to [facility] Main Entrance    Report to admitting at  1200 noon    Call this number if you have problems the morning of surgery [phone]   Do not eat food :After Midnight.   After Midnight you may have the following liquids until _ 1130_____ AMOF SURGERY  Water Non-Citrus Juices (without pulp, NO RED) Carbonated Beverages Black Coffee (NO MILK/CREAM OR CREAMERS, sugar ok)  Clear Tea (NO MILK/CREAM OR CREAMERS, sugar ok) regular and decaf                             Plain Jell-O (NO RED)                                           Fruit ices (not with fruit pulp, NO RED)                                     Popsicles (NO RED)                                                               Sports drinks like Gatorade (NO RED)       The day of surgery:  Drink ONE (1) Pre-Surgery Clear Ensure or G2 at  1130 AM  ( have completed by ) the morning of surgery. Drink in one sitting. Do not sip.  This drink was given to you during your hospital  pre-op appointment visit. Nothing else to drink after completing the  Pre-Surgery Clear Ensure or G2.          If you have questions, please contact your surgeon's office.       Oral Hygiene is also important to reduce your risk  of infection.                                    Remember - BRUSH YOUR TEETH THE MORNING OF SURGERY WITH YOUR REGULAR TOOTHPASTE   Do NOT smoke after Midnight   Take these medicines the morning of surgery with A SIP OF WATER:  lyrica, abilify, buspar, lexapro   DO NOT TAKE ANY ORAL DIABETIC MEDICATIONS DAY OF YOUR SURGERY  Bring CPAP  mask and tubing day of surgery.                              You may not have any metal on your body including hair pins, jewelry, and body piercing             Do not wear make-up, lotions, powders, perfumes/cologne, or deodorant  Do not wear nail polish including gel and S&S, artificial/acrylic nails, or any other type of covering on natural nails including finger and toenails. If you have artificial nails, gel coating, etc. that needs to be removed by a nail salon please have this removed prior to surgery or surgery may need to be canceled/ delayed if the surgeon/ anesthesia feels like they are unable to be safely monitored.   Do not shave  48 hours prior to surgery.               Men may shave face and neck.   Do not bring valuables to the hospital. [place].   Contacts, dentures or bridgework may not be worn into surgery.   Bring small overnight bag day of surgery.   DO NOT [place]. PHARMACY WILL DISPENSE MEDICATIONS LISTED ON YOUR MEDICATION LIST TO YOU DURING YOUR ADMISSION [place]!    Patients discharged on the day of surgery will not be allowed to drive home.  Someone NEEDS to stay with you for the first 24 hours after anesthesia.   Special Instructions: Bring a copy of your healthcare power of attorney and living will documents the day of surgery if you haven't scanned them before.              Please read over the following fact sheets you were given: IF [place] [phone]   If you received  a COVID test during your pre-op visit  it is requested that you wear a mask when out in public, stay away from anyone that may not be feeling well and notify your surgeon if you develop symptoms. If you test positive for Covid or have been in contact with anyone that has tested positive in the last 10 days please notify you surgeon.     [facility] - Preparing for Surgery Before surgery, you can play an important role.  Because skin is not sterile, your skin needs to be as free of germs as possible.  You can reduce the number of germs on your skin by washing with CHG (chlorahexidine gluconate) soap before surgery.  CHG is an antiseptic cleaner which kills germs and bonds with the skin to continue killing germs even after washing. Please DO NOT use if you have an allergy to CHG or antibacterial soaps.  If your skin becomes reddened/irritated stop using the CHG and inform your nurse when you arrive at Short Stay. Do not shave (including legs and underarms) for at least 48 hours prior to the first CHG shower.  You may shave your face/neck. Please follow these instructions carefully:  1.  Shower with CHG Soap the night before surgery and the  morning of Surgery.  2.  If you choose to wash your hair, wash your hair first as usual with your  normal  shampoo.  3.  After you shampoo, rinse your  hair and body thoroughly to remove the  shampoo.                           4.  Use CHG as you would any other liquid soap.  You can apply chg directly  to the skin and wash                       Gently with a scrungie or clean washcloth.  5.  Apply the CHG Soap to your body ONLY FROM THE NECK DOWN.   Do not use on face/ open                           Wound or open sores. Avoid contact with eyes, ears mouth and genitals (private parts).                       Wash face,  Genitals (private parts) with your normal soap.             6.  Wash thoroughly, paying special attention to the area where your surgery  will be  performed.  7.  Thoroughly rinse your body with warm water from the neck down.  8.  DO NOT shower/wash with your normal soap after using and rinsing off  the CHG Soap.                9.  Pat yourself dry with a clean towel.            10.  Wear clean pajamas.            11.  Place clean sheets on your bed the night of your first shower and do not  sleep with pets. Day of Surgery : Do not apply any lotions/deodorants the morning of surgery.  Please wear clean clothes to the hospital/surgery center.  FAILURE TO FOLLOW THESE INSTRUCTIONS MAY RESULT IN THE CANCELLATION OF YOUR SURGERY PATIENT SIGNATURE_________________________________  NURSE SIGNATURE__________________________________  ________________________________________________________________________

## 2022-09-02 ENCOUNTER — Encounter (HOSPITAL_COMMUNITY)
Admission: RE | Admit: 2022-09-02 | Discharge: 2022-09-02 | Disposition: A | Discharge status: Home / Self Care | Payer: No Typology Code available for payment source | Source: Ambulatory Visit | Attending: Orthopedic Surgery | Admitting: Orthopedic Surgery

## 2022-09-02 ENCOUNTER — Other Ambulatory Visit: Payer: Self-pay

## 2022-09-02 ENCOUNTER — Encounter (HOSPITAL_COMMUNITY): Payer: Self-pay | Admitting: Orthopedic Surgery

## 2022-09-02 VITALS — BP 102/65 | HR 70 | Temp 98.3°F | Resp 16 | Ht 64.0 in

## 2022-09-02 DIAGNOSIS — M1712 Unilateral primary osteoarthritis, left knee: Secondary | ICD-10-CM | POA: Insufficient documentation

## 2022-09-02 DIAGNOSIS — G47419 Narcolepsy without cataplexy: Secondary | ICD-10-CM | POA: Diagnosis not present

## 2022-09-02 DIAGNOSIS — G4733 Obstructive sleep apnea (adult) (pediatric): Secondary | ICD-10-CM | POA: Diagnosis not present

## 2022-09-02 DIAGNOSIS — Z01812 Encounter for preprocedural laboratory examination: Secondary | ICD-10-CM | POA: Diagnosis present

## 2022-09-02 DIAGNOSIS — Z853 Personal history of malignant neoplasm of breast: Secondary | ICD-10-CM | POA: Insufficient documentation

## 2022-09-02 DIAGNOSIS — I447 Left bundle-branch block, unspecified: Secondary | ICD-10-CM | POA: Insufficient documentation

## 2022-09-02 DIAGNOSIS — E039 Hypothyroidism, unspecified: Secondary | ICD-10-CM | POA: Diagnosis not present

## 2022-09-02 DIAGNOSIS — Z87891 Personal history of nicotine dependence: Secondary | ICD-10-CM | POA: Insufficient documentation

## 2022-09-02 DIAGNOSIS — Z01818 Encounter for other preprocedural examination: Secondary | ICD-10-CM

## 2022-09-02 LAB — CBC
HCT: 39.4 % (ref 36.0–46.0)
Hemoglobin: 12.6 g/dL (ref 12.0–15.0)
MCH: 28.2 pg (ref 26.0–34.0)
MCHC: 32 g/dL (ref 30.0–36.0)
MCV: 88.1 fL (ref 80.0–100.0)
Platelets: 245 10*3/uL (ref 150–400)
RBC: 4.47 MIL/uL (ref 3.87–5.11)
RDW: 13.3 % (ref 11.5–15.5)
WBC: 7.3 10*3/uL (ref 4.0–10.5)
nRBC: 0 % (ref 0.0–0.2)

## 2022-09-02 LAB — BASIC METABOLIC PANEL
Anion gap: 7 (ref 5–15)
BUN: 20 mg/dL (ref 8–23)
CO2: 29 mmol/L (ref 22–32)
Calcium: 9.1 mg/dL (ref 8.9–10.3)
Chloride: 105 mmol/L (ref 98–111)
Creatinine, Ser: 0.78 mg/dL (ref 0.44–1.00)
GFR, Estimated: >60 mL/min (ref >60)
Glucose, Bld: 86 mg/dL (ref 70–99)
Potassium: 4.2 mmol/L (ref 3.5–5.1)
Sodium: 141 mmol/L (ref 135–145)

## 2022-09-02 LAB — SURGICAL PCR SCREEN
MRSA, PCR: NEGATIVE
Staphylococcus aureus: POSITIVE — AB

## 2022-09-02 NOTE — Progress Notes (Addendum)
Anesthesia Review:  PCP: DR  Marton Redwood- clearance 08/03/22 on chart  LOV 08/03/22 on chart  Cardiologist : Sharyne Peach OV on chart- 07/19/22- clearance in note  Chest x-ray : EKG : 02/08/22  Echo : 02/10/22  Stress test: Cardiac Cath :  Activity level: can do a flight of stairs without difficutly  Sleep Study/ CPAP : has cpap  Fasting Blood Sugar :      / Checks Blood Sugar -- times a day:   Blood Thinner/ Instructions /Last Dose: ASA / Instructions/ Last Dose :   Hgba1c- 08/03/22- 5.6 on chart  PT was not at preop appt at 200pm.  Called pt and she stated she " [place] [address]" and " running late.".  PT arrived at 1430pm.  completed med hx via phone .  When pt arrived to [facility] preop nurse went to car out front and assisted pt to wheelchair to come in for  preop appt.  [place] parked car.  Labs were drawn then reviewed preop instructons with pt PT has copy of preop instructons along with hibiclens and Ensure presurgery drink.  Vital signs completed.  PT taken to admitting via wheelchair to complete demographic paperwork for surgery.    Reagen.Frieze- PT reports at preop she has only had once dose.  PT aware to stop 7 days prior to surgery and placed on preop instructions.

## 2022-09-03 NOTE — Progress Notes (Signed)
Patient's PCR screen is positive for  STAPH. Appropriate notes have been placed on the patient's chart. This note has been routed to Dr. Lyla Glassing for review. The Patient's surgery is currently scheduled for:  09-15-22 at [facility].  Leota Jacobsen, BSN, CVRN-BC   Pre-Surgical Testing Nurse [place]  [phone]

## 2022-09-03 NOTE — Progress Notes (Signed)
Anesthesia Chart Review   Case: [id number] Date/Time: 09/15/22 1300   Procedure: COMPUTER ASSISTED TOTAL KNEE ARTHROPLASTY (Left: Knee)   Anesthesia type: Spinal   Pre-op diagnosis: Left knee osteoarthritis   Location: [facility] ROOM 07 / [facility]   Surgeons: Rod Can, MD       DISCUSSION:61 y.o. former smoker with h/o PONV, LBBB, OSA on CPAP, breast cancer status postchemotherapy with bilateral mastectomy, hypothyroidism, narcolepsy, left knee OA scheduled for above procedure 09/15/2022 with Dr. Rod Can.   Pt last seen by cardiology 07/19/2022. Per OV note, "Ms. [name]'s perioperative risk of a major cardiac event is 0.4% according to the Revised Cardiac Risk Index (RCRI).  Therefore, she is at low risk for perioperative complications.   Her functional capacity is good at 6.55 METs according to the Duke Activity Status Index (DASI). Recommendations: According to ACC/AHA guidelines, no further cardiovascular testing needed.  The patient may proceed to surgery at acceptable risk."  Anticipate pt can proceed with planned procedure barring acute status change.   VS: BP 102/65   Pulse 70   Temp 36.8 C (Oral)   Resp 16   Ht '5\' 4"'$  (1.626 m)   SpO2 97%   BMI 28.32 kg/m   PROVIDERS: Ginger Organ., MD is PCP   Primary Cardiologist:  Rozann Lesches, MD LABS: Labs reviewed: Acceptable for surgery. (all labs ordered are listed, but only abnormal results are displayed)  Labs Reviewed  SURGICAL PCR SCREEN - Abnormal; Notable for the following components:      Result Value   Staphylococcus aureus POSITIVE (*)    All other components within normal limits  BASIC METABOLIC PANEL  CBC     IMAGES:   EKG:   CV: Echo 02/10/2022 1. Left ventricular ejection fraction, by estimation, is 50 to 55%. The  left ventricle has low normal function. The left ventricle has no regional  wall motion abnormalities. Septal motion consistent with left bundle  branch block. Left ventricular   diastolic parameters are consistent with Grade I diastolic dysfunction  (impaired relaxation).   2. Right ventricular systolic function is normal. The right ventricular  size is normal. Tricuspid regurgitation signal is inadequate for assessing  PA pressure.   3. The mitral valve is grossly normal. No evidence of mitral valve  regurgitation.   4. The aortic valve is tricuspid. Aortic valve regurgitation is not  visualized.   5. The inferior vena cava is normal in size with greater than 50%  respiratory variability, suggesting right atrial pressure of 3 mmHg.  Past Medical History:  Diagnosis Date   Anxiety    Arthritis    Breast cancer ([place])    Status post bilateral mastectomy   Breast reconstruction deformity    Complication of anesthesia    shaking-after mastectomies-resp and hr dropped   Depression, major    suicidal thoughts -implants, mental health, 05/11   Fibromyalgia    GERD (gastroesophageal reflux disease)    Hypothyroidism    Narcolepsy    OSA on CPAP    PONV (postoperative nausea and vomiting)    Sleep apnea with use of continuous positive airway pressure (CPAP)    AHI 20 in HST, titration to 6 cm water - residual AHi 6 . remaining hypersomnic.     Past Surgical History:  Procedure Laterality Date   ARTHRODESIS METATARSALPHALANGEAL JOINT (MTPJ) Right 07/28/2017   Procedure: ARTHRODESIS  RIGHT HALLUX METATARSALPHALANGEAL JOINT (MTPJ);  Surgeon: Wylene Simmer, MD;  Location: [place];  Service: Orthopedics;  Laterality: Right;   Breast reconstructioin  7/09   flaps-bilat mastectomies   BTL     CARPAL TUNNEL RELEASE  2011   both hands   CARPOMETACARPEL SUSPENSION PLASTY Right 08/29/2014   Procedure: REMOVAL DERMASPAN SUSPENSIONPLASTY RIGHT THUMB ABDUCTOR POLLIS TRANSFER;  Surgeon: Daryll Brod, MD;  Location: [place];  Service: Orthopedics;  Laterality: Right;   CATARACT EXTRACTION W/PHACO Right 12/17/2019   Procedure: CATARACT  EXTRACTION PHACO AND INTRAOCULAR LENS PLACEMENT (IOC);  Surgeon: Baruch Goldmann, MD;  [facility];  Service: Ophthalmology;  Laterality: Right;  CDE: 6.54   CATARACT EXTRACTION W/PHACO Left 12/31/2019   Procedure: CATARACT EXTRACTION PHACO AND INTRAOCULAR LENS PLACEMENT (IOC);  Surgeon: Baruch Goldmann, MD;  Location: [facility];  Service: Ophthalmology;  Laterality: Left;  CDE: 3.66   COLONOSCOPY W/ POLYPECTOMY  4/03   tubulovillous adenoma   FINGER ARTHROSCOPY WITH CARPOMETACARPEL ([place]) ARTHROPLASTY Right 06/20/2014   Procedure: ARTHROSCOPY RIGHT THUMB CARPOMETACARPEL GRAFT JACKET INTERPOSITION PARTIAL TRAPEZIECTOMY POSSIBLE OPEN;  Surgeon: Daryll Brod, MD;  Location: [place];  Service: Orthopedics;  Laterality: Right;   GANGLION CYST EXCISION     both [phone]   HAMMER TOE SURGERY Right 07/28/2017   Procedure: THIRD HAMMERTOE CORRECTION;  Surgeon: Wylene Simmer, MD;  Location: [place];  Service: Orthopedics;  Laterality: Right;   HARDWARE REMOVAL Right 07/28/2017   Procedure: REMOVAL OF DEEP IMPLANTS;  Surgeon: Wylene Simmer, MD;  Location: [place];  Service: Orthopedics;  Laterality: Right;   hysterestomy  2005   MASTECTOMY Bilateral 2008   post- a -cath  2008   right knee arthroscopy     TOTAL SHOULDER ARTHROPLASTY Left 01/13/2017   Procedure: LEFT TOTAL SHOULDER ARTHROPLASTY;  Surgeon: Tania Ade, MD;  Location: [place];  Service: Orthopedics;  Laterality: Left;  LEFT TOTAL SHOULDER ARTHROPLASTY   TOTAL SHOULDER ARTHROPLASTY Right 02/11/2022   Procedure: TOTAL SHOULDER ARTHROPLASTY;  Surgeon: Nicholes Stairs, MD;  Location: [place];  Service: Orthopedics;  Laterality: Right;  150   TRIGGER FINGER RELEASE Right 08/29/2014   Procedure: RELEASE TRIGGER FINGER/A-1 PULLEY;  Surgeon: Daryll Brod, MD;  Location: [place];  Service: Orthopedics;  Laterality: Right;   wisdom teeth out      MEDICATIONS:  ARIPiprazole (ABILIFY)  20 MG tablet   benztropine (COGENTIN) 1 MG tablet   busPIRone (BUSPAR) 10 MG tablet   diclofenac sodium (VOLTAREN) 1 % GEL   escitalopram (LEXAPRO) 20 MG tablet   Melatonin 5 MG TABS   methocarbamol (ROBAXIN) 500 MG tablet   methylphenidate (RITALIN LA) 30 MG 24 hr capsule   morphine (MS CONTIN) 30 MG 12 hr tablet   NON FORMULARY   ondansetron (ZOFRAN) 4 MG tablet   pregabalin (LYRICA) 150 MG capsule   propranolol ER (INDERAL LA) 120 MG 24 hr capsule   Semaglutide-Weight Management (WEGOVY) 0.5 MG/0.5ML SOAJ   No current facility-administered medications for this encounter.    Konrad Felix Ward, PA-C [facility] Pre-Surgical Testing [phone]

## 2022-09-03 NOTE — Anesthesia Preprocedure Evaluation (Addendum)
Anesthesia Evaluation  Patient identified by MRN, date of birth, ID band Patient awake    Reviewed: Allergy & Precautions, NPO status , Patient's Chart, lab work & pertinent test results  History of Anesthesia Complications (+) PONV and history of anesthetic complications  Airway Mallampati: II  TM Distance: >3 FB Neck ROM: Full    Dental no notable dental hx. (+) Dental Advisory Given, Teeth Intact   Pulmonary sleep apnea and Continuous Positive Airway Pressure Ventilation , former smoker   Pulmonary exam normal breath sounds clear to auscultation       Cardiovascular Normal cardiovascular exam Rhythm:Regular Rate:Normal  EKG - LBBB   Echo 02/10/22:   1. Left ventricular ejection fraction, by estimation, is 50 to 55%. The left ventricle has low normal function. The left ventricle has no regional wall motion abnormalities. Septal motion consistent with left bundle branch block. Left ventricular diastolic parameters are consistent with Grade I diastolic dysfunction (impaired relaxation).  2. Right ventricular systolic function is normal. The right ventricular size is normal. Tricuspid regurgitation signal is inadequate for assessing PA pressure.  3. The mitral valve is grossly normal. No evidence of mitral valve regurgitation.  4. The aortic valve is tricuspid. Aortic valve regurgitation is not visualized.  5. The inferior vena cava is normal in size with greater than 50% respiratory variability, suggesting right atrial pressure of 3 mmHg.   - Comparison(s): Prior images unable to be directly viewed.        Neuro/Psych  Headaches PSYCHIATRIC DISORDERS Anxiety Depression     Neuromuscular disease negative neurological ROS     GI/Hepatic Neg liver ROS,GERD  ,,  Endo/Other  Hypothyroidism    Renal/GU negative Renal ROS  negative genitourinary   Musculoskeletal  (+) Arthritis ,  Fibromyalgia -  Abdominal   Peds   Hematology negative hematology ROS (+)   Anesthesia Other Findings   Reproductive/Obstetrics                             Anesthesia Physical Anesthesia Plan  ASA: 2  Anesthesia Plan: Spinal   Post-op Pain Management: Regional block*, Tylenol PO (pre-op)* and Gabapentin PO (pre-op)*   Induction: Intravenous  PONV Risk Score and Plan: 3 and Ondansetron, Dexamethasone, Midazolam, Treatment may vary due to age or medical condition, Propofol infusion and TIVA  Airway Management Planned:   Additional Equipment: None  Intra-op Plan:   Post-operative Plan:   Informed Consent: I have reviewed the patients History and Physical, chart, labs and discussed the procedure including the risks, benefits and alternatives for the proposed anesthesia with the patient or authorized representative who has indicated his/her understanding and acceptance.     Dental advisory given  Plan Discussed with: CRNA  Anesthesia Plan Comments: (See PAT note 09/02/2022)        Anesthesia Quick Evaluation

## 2022-09-15 ENCOUNTER — Ambulatory Visit (HOSPITAL_COMMUNITY)
Admission: RE | Admit: 2022-09-15 | Discharge: 2022-09-16 | Disposition: A | Discharge status: Home / Self Care | Payer: No Typology Code available for payment source | Attending: Orthopedic Surgery | Admitting: Orthopedic Surgery

## 2022-09-15 ENCOUNTER — Ambulatory Visit (HOSPITAL_COMMUNITY): Payer: No Typology Code available for payment source | Admitting: Physician Assistant

## 2022-09-15 ENCOUNTER — Encounter (HOSPITAL_COMMUNITY): Payer: Self-pay | Admitting: Orthopedic Surgery

## 2022-09-15 ENCOUNTER — Other Ambulatory Visit: Payer: Self-pay

## 2022-09-15 ENCOUNTER — Ambulatory Visit (HOSPITAL_BASED_OUTPATIENT_CLINIC_OR_DEPARTMENT_OTHER): Payer: No Typology Code available for payment source | Admitting: Certified Registered"

## 2022-09-15 ENCOUNTER — Ambulatory Visit (HOSPITAL_COMMUNITY): Payer: No Typology Code available for payment source

## 2022-09-15 ENCOUNTER — Encounter (HOSPITAL_COMMUNITY)
Admission: RE | Disposition: A | Discharge status: Home / Self Care | Payer: Self-pay | Source: Home / Self Care | Attending: Orthopedic Surgery

## 2022-09-15 DIAGNOSIS — G4730 Sleep apnea, unspecified: Secondary | ICD-10-CM | POA: Diagnosis not present

## 2022-09-15 DIAGNOSIS — M797 Fibromyalgia: Secondary | ICD-10-CM | POA: Insufficient documentation

## 2022-09-15 DIAGNOSIS — F419 Anxiety disorder, unspecified: Secondary | ICD-10-CM | POA: Diagnosis not present

## 2022-09-15 DIAGNOSIS — G4733 Obstructive sleep apnea (adult) (pediatric): Secondary | ICD-10-CM | POA: Diagnosis not present

## 2022-09-15 DIAGNOSIS — E039 Hypothyroidism, unspecified: Secondary | ICD-10-CM | POA: Diagnosis not present

## 2022-09-15 DIAGNOSIS — Z96652 Presence of left artificial knee joint: Secondary | ICD-10-CM

## 2022-09-15 DIAGNOSIS — Z87891 Personal history of nicotine dependence: Secondary | ICD-10-CM | POA: Diagnosis not present

## 2022-09-15 DIAGNOSIS — I447 Left bundle-branch block, unspecified: Secondary | ICD-10-CM | POA: Insufficient documentation

## 2022-09-15 DIAGNOSIS — F32A Depression, unspecified: Secondary | ICD-10-CM | POA: Diagnosis not present

## 2022-09-15 DIAGNOSIS — K219 Gastro-esophageal reflux disease without esophagitis: Secondary | ICD-10-CM | POA: Insufficient documentation

## 2022-09-15 DIAGNOSIS — M1712 Unilateral primary osteoarthritis, left knee: Secondary | ICD-10-CM | POA: Diagnosis present

## 2022-09-15 DIAGNOSIS — Z01818 Encounter for other preprocedural examination: Secondary | ICD-10-CM

## 2022-09-15 DIAGNOSIS — R519 Headache, unspecified: Secondary | ICD-10-CM | POA: Insufficient documentation

## 2022-09-15 HISTORY — DX: Nausea with vomiting, unspecified: R11.2

## 2022-09-15 HISTORY — PX: KNEE ARTHROPLASTY: SHX992

## 2022-09-15 HISTORY — DX: Gastro-esophageal reflux disease without esophagitis: K21.9

## 2022-09-15 HISTORY — DX: Nausea with vomiting, unspecified: Z98.890

## 2022-09-15 SURGERY — ARTHROPLASTY, KNEE, TOTAL, USING IMAGELESS COMPUTER-ASSISTED NAVIGATION
Anesthesia: Spinal | Site: Knee | Laterality: Left

## 2022-09-15 MED ORDER — TRANEXAMIC ACID-NACL 1000-0.7 MG/100ML-% IV SOLN
1000.0000 mg | INTRAVENOUS | Status: AC
  Administered 2022-09-15: 1000 mg via INTRAVENOUS
  Filled 2022-09-15: qty 100

## 2022-09-15 MED ORDER — PROPRANOLOL HCL ER 60 MG PO CP24
120.0000 mg | ORAL_CAPSULE | Freq: Two times a day (BID) | ORAL | Status: DC
  Administered 2022-09-15 – 2022-09-16 (×2): 120 mg via ORAL
  Filled 2022-09-15 (×2): qty 2

## 2022-09-15 MED ORDER — GABAPENTIN 300 MG PO CAPS: 300.0000 mg | ORAL_CAPSULE | Freq: Once | ORAL | Status: DC

## 2022-09-15 MED ORDER — SENNA 8.6 MG PO TABS
1.0000 | ORAL_TABLET | Freq: Two times a day (BID) | ORAL | Status: DC
  Administered 2022-09-16: 8.6 mg via ORAL
  Filled 2022-09-15 (×2): qty 1

## 2022-09-15 MED ORDER — ASPIRIN 81 MG PO CHEW
81.0000 mg | CHEWABLE_TABLET | Freq: Two times a day (BID) | ORAL | Status: DC
  Administered 2022-09-15 – 2022-09-16 (×2): 81 mg via ORAL
  Filled 2022-09-15 (×2): qty 1

## 2022-09-15 MED ORDER — PROPOFOL 1000 MG/100ML IV EMUL
INTRAVENOUS | Status: AC
  Filled 2022-09-15: qty 100

## 2022-09-15 MED ORDER — BUSPIRONE HCL 10 MG PO TABS
10.0000 mg | ORAL_TABLET | Freq: Three times a day (TID) | ORAL | Status: DC | PRN
  Administered 2022-09-15: 10 mg via ORAL
  Filled 2022-09-15: qty 1

## 2022-09-15 MED ORDER — OXYCODONE HCL 5 MG PO TABS
10.0000 mg | ORAL_TABLET | ORAL | Status: DC | PRN
  Administered 2022-09-15 – 2022-09-16 (×3): 15 mg via ORAL
  Filled 2022-09-15 (×3): qty 3

## 2022-09-15 MED ORDER — DIPHENHYDRAMINE HCL 12.5 MG/5ML PO ELIX: 12.5000 mg | ORAL_SOLUTION | ORAL | Status: DC | PRN

## 2022-09-15 MED ORDER — LACTATED RINGERS IV SOLN: INTRAVENOUS | Status: DC

## 2022-09-15 MED ORDER — ONDANSETRON HCL 4 MG/2ML IJ SOLN
INTRAMUSCULAR | Status: DC | PRN
  Administered 2022-09-15: 4 mg via INTRAVENOUS

## 2022-09-15 MED ORDER — METHOCARBAMOL 1000 MG/10ML IJ SOLN: 500.0000 mg | Freq: Four times a day (QID) | INTRAVENOUS | Status: DC | PRN

## 2022-09-15 MED ORDER — ONDANSETRON HCL 4 MG PO TABS: 4.0000 mg | ORAL_TABLET | Freq: Four times a day (QID) | ORAL | Status: DC | PRN

## 2022-09-15 MED ORDER — DEXAMETHASONE SODIUM PHOSPHATE 10 MG/ML IJ SOLN
INTRAMUSCULAR | Status: DC | PRN
  Administered 2022-09-15: 10 mg via INTRAVENOUS

## 2022-09-15 MED ORDER — ORAL CARE MOUTH RINSE: 15.0000 mL | Freq: Once | OROMUCOSAL | Status: AC

## 2022-09-15 MED ORDER — KETOROLAC TROMETHAMINE 30 MG/ML IJ SOLN
INTRAMUSCULAR | Status: AC
  Filled 2022-09-15: qty 1

## 2022-09-15 MED ORDER — PROMETHAZINE HCL 25 MG/ML IJ SOLN: 6.2500 mg | INTRAMUSCULAR | Status: DC | PRN

## 2022-09-15 MED ORDER — FENTANYL CITRATE PF 50 MCG/ML IJ SOSY
50.0000 ug | PREFILLED_SYRINGE | INTRAMUSCULAR | Status: DC
  Administered 2022-09-15: 50 ug via INTRAVENOUS
  Filled 2022-09-15: qty 2

## 2022-09-15 MED ORDER — SODIUM CHLORIDE 0.9 % IV SOLN: INTRAVENOUS | Status: DC

## 2022-09-15 MED ORDER — PREGABALIN 75 MG PO CAPS
150.0000 mg | ORAL_CAPSULE | Freq: Two times a day (BID) | ORAL | Status: DC
  Administered 2022-09-15 – 2022-09-16 (×2): 150 mg via ORAL
  Filled 2022-09-15 (×2): qty 2

## 2022-09-15 MED ORDER — BENZTROPINE MESYLATE 0.5 MG PO TABS
1.0000 mg | ORAL_TABLET | Freq: Every day | ORAL | Status: DC
  Administered 2022-09-15: 1 mg via ORAL
  Filled 2022-09-15: qty 2

## 2022-09-15 MED ORDER — METHYLPHENIDATE HCL ER 10 MG PO TBCR: 30.0000 mg | EXTENDED_RELEASE_TABLET | Freq: Every morning | ORAL | Status: DC

## 2022-09-15 MED ORDER — BUPIVACAINE IN DEXTROSE 0.75-8.25 % IT SOLN
INTRATHECAL | Status: DC | PRN
  Administered 2022-09-15: 1.8 mL via INTRATHECAL

## 2022-09-15 MED ORDER — CEFAZOLIN SODIUM-DEXTROSE 2-4 GM/100ML-% IV SOLN
2.0000 g | Freq: Four times a day (QID) | INTRAVENOUS | Status: AC
  Administered 2022-09-15 – 2022-09-16 (×2): 2 g via INTRAVENOUS
  Filled 2022-09-15 (×2): qty 100

## 2022-09-15 MED ORDER — DEXAMETHASONE SODIUM PHOSPHATE 10 MG/ML IJ SOLN
INTRAMUSCULAR | Status: AC
  Filled 2022-09-15: qty 1

## 2022-09-15 MED ORDER — POVIDONE-IODINE 10 % EX SWAB
2.0000 | Freq: Once | CUTANEOUS | Status: AC
  Administered 2022-09-15: 2 via TOPICAL

## 2022-09-15 MED ORDER — OXYCODONE HCL 5 MG PO TABS: 5.0000 mg | ORAL_TABLET | Freq: Once | ORAL | Status: DC | PRN

## 2022-09-15 MED ORDER — PROPOFOL 10 MG/ML IV BOLUS
INTRAVENOUS | Status: DC | PRN
  Administered 2022-09-15: 20 mg via INTRAVENOUS

## 2022-09-15 MED ORDER — ISOPROPYL ALCOHOL 70 % SOLN
Status: DC | PRN
  Administered 2022-09-15: 1 via TOPICAL

## 2022-09-15 MED ORDER — ACETAMINOPHEN 325 MG PO TABS: 325.0000 mg | ORAL_TABLET | Freq: Four times a day (QID) | ORAL | Status: DC | PRN

## 2022-09-15 MED ORDER — MIDAZOLAM HCL 2 MG/2ML IJ SOLN
1.0000 mg | INTRAMUSCULAR | Status: DC
  Administered 2022-09-15: 2 mg via INTRAVENOUS
  Filled 2022-09-15: qty 2

## 2022-09-15 MED ORDER — AMISULPRIDE (ANTIEMETIC) 5 MG/2ML IV SOLN: 10.0000 mg | Freq: Once | INTRAVENOUS | Status: DC | PRN

## 2022-09-15 MED ORDER — MELATONIN 5 MG PO TABS
5.0000 mg | ORAL_TABLET | Freq: Every day | ORAL | Status: DC
  Administered 2022-09-15: 5 mg via ORAL
  Filled 2022-09-15: qty 1

## 2022-09-15 MED ORDER — ACETAMINOPHEN 500 MG PO TABS
1000.0000 mg | ORAL_TABLET | Freq: Four times a day (QID) | ORAL | Status: DC
  Administered 2022-09-15 – 2022-09-16 (×3): 1000 mg via ORAL
  Filled 2022-09-15 (×3): qty 2

## 2022-09-15 MED ORDER — DOCUSATE SODIUM 100 MG PO CAPS
100.0000 mg | ORAL_CAPSULE | Freq: Two times a day (BID) | ORAL | Status: DC
  Administered 2022-09-16: 100 mg via ORAL
  Filled 2022-09-15 (×2): qty 1

## 2022-09-15 MED ORDER — BUPIVACAINE-EPINEPHRINE (PF) 0.25% -1:200000 IJ SOLN
INTRAMUSCULAR | Status: AC
  Filled 2022-09-15: qty 30

## 2022-09-15 MED ORDER — SODIUM CHLORIDE (PF) 0.9 % IJ SOLN
INTRAMUSCULAR | Status: AC
  Filled 2022-09-15: qty 30

## 2022-09-15 MED ORDER — ALUM & MAG HYDROXIDE-SIMETH 200-200-20 MG/5ML PO SUSP: 30.0000 mL | ORAL | Status: DC | PRN

## 2022-09-15 MED ORDER — HYDROMORPHONE HCL 1 MG/ML IJ SOLN
0.5000 mg | INTRAMUSCULAR | Status: DC | PRN
  Administered 2022-09-15 – 2022-09-16 (×2): 1 mg via INTRAVENOUS
  Filled 2022-09-15 (×2): qty 1

## 2022-09-15 MED ORDER — PHENOL 1.4 % MT LIQD: 1.0000 | OROMUCOSAL | Status: DC | PRN

## 2022-09-15 MED ORDER — BUPIVACAINE-EPINEPHRINE 0.25% -1:200000 IJ SOLN
INTRAMUSCULAR | Status: DC | PRN
  Administered 2022-09-15: 30 mL

## 2022-09-15 MED ORDER — ESCITALOPRAM OXALATE 20 MG PO TABS
20.0000 mg | ORAL_TABLET | Freq: Two times a day (BID) | ORAL | Status: DC
  Administered 2022-09-15 – 2022-09-16 (×2): 20 mg via ORAL
  Filled 2022-09-15 (×2): qty 1

## 2022-09-15 MED ORDER — POVIDONE-IODINE 10 % EX SWAB: 2.0000 | Freq: Once | CUTANEOUS | Status: DC

## 2022-09-15 MED ORDER — OXYCODONE HCL 5 MG/5ML PO SOLN: 5.0000 mg | Freq: Once | ORAL | Status: DC | PRN

## 2022-09-15 MED ORDER — POLYETHYLENE GLYCOL 3350 17 G PO PACK: 17.0000 g | PACK | Freq: Every day | ORAL | Status: DC | PRN

## 2022-09-15 MED ORDER — SODIUM CHLORIDE 0.9 % IR SOLN
Status: DC | PRN
  Administered 2022-09-15: 3000 mL
  Administered 2022-09-15: 1000 mL

## 2022-09-15 MED ORDER — METHOCARBAMOL 500 MG PO TABS
500.0000 mg | ORAL_TABLET | Freq: Four times a day (QID) | ORAL | Status: DC | PRN
  Administered 2022-09-15 – 2022-09-16 (×3): 500 mg via ORAL
  Filled 2022-09-15 (×3): qty 1

## 2022-09-15 MED ORDER — ARIPIPRAZOLE 10 MG PO TABS
20.0000 mg | ORAL_TABLET | Freq: Every day | ORAL | Status: DC
  Administered 2022-09-16: 20 mg via ORAL
  Filled 2022-09-15: qty 2

## 2022-09-15 MED ORDER — HYDROMORPHONE HCL 1 MG/ML IJ SOLN: 0.2500 mg | INTRAMUSCULAR | Status: DC | PRN

## 2022-09-15 MED ORDER — METOCLOPRAMIDE HCL 5 MG PO TABS: 5.0000 mg | ORAL_TABLET | Freq: Three times a day (TID) | ORAL | Status: DC | PRN

## 2022-09-15 MED ORDER — ONDANSETRON HCL 4 MG/2ML IJ SOLN: 4.0000 mg | Freq: Four times a day (QID) | INTRAMUSCULAR | Status: DC | PRN

## 2022-09-15 MED ORDER — METOCLOPRAMIDE HCL 5 MG/ML IJ SOLN: 5.0000 mg | Freq: Three times a day (TID) | INTRAMUSCULAR | Status: DC | PRN

## 2022-09-15 MED ORDER — MEPERIDINE HCL 50 MG/ML IJ SOLN: 6.2500 mg | INTRAMUSCULAR | Status: DC | PRN

## 2022-09-15 MED ORDER — CHLORHEXIDINE GLUCONATE 0.12 % MT SOLN
15.0000 mL | Freq: Once | OROMUCOSAL | Status: AC
  Administered 2022-09-15: 15 mL via OROMUCOSAL

## 2022-09-15 MED ORDER — DEXAMETHASONE SODIUM PHOSPHATE 4 MG/ML IJ SOLN
INTRAMUSCULAR | Status: DC | PRN
  Administered 2022-09-15: 5 mg via PERINEURAL

## 2022-09-15 MED ORDER — PROPOFOL 500 MG/50ML IV EMUL
INTRAVENOUS | Status: DC | PRN
  Administered 2022-09-15: 125 ug/kg/min via INTRAVENOUS

## 2022-09-15 MED ORDER — CEFAZOLIN SODIUM-DEXTROSE 2-4 GM/100ML-% IV SOLN
2.0000 g | INTRAVENOUS | Status: AC
  Administered 2022-09-15: 2 g via INTRAVENOUS
  Filled 2022-09-15: qty 100

## 2022-09-15 MED ORDER — ROPIVACAINE HCL 5 MG/ML IJ SOLN
INTRAMUSCULAR | Status: DC | PRN
  Administered 2022-09-15: 30 mL via PERINEURAL

## 2022-09-15 MED ORDER — MENTHOL 3 MG MT LOZG: 1.0000 | LOZENGE | OROMUCOSAL | Status: DC | PRN

## 2022-09-15 MED ORDER — KETOROLAC TROMETHAMINE 30 MG/ML IJ SOLN
INTRAMUSCULAR | Status: DC | PRN
  Administered 2022-09-15: 30 mg via INTRAMUSCULAR

## 2022-09-15 MED ORDER — ONDANSETRON HCL 4 MG/2ML IJ SOLN
INTRAMUSCULAR | Status: AC
  Filled 2022-09-15: qty 2

## 2022-09-15 MED ORDER — CLONIDINE HCL (ANALGESIA) 100 MCG/ML EP SOLN
EPIDURAL | Status: DC | PRN
  Administered 2022-09-15: 80 ug

## 2022-09-15 MED ORDER — OXYCODONE HCL 5 MG PO TABS
5.0000 mg | ORAL_TABLET | ORAL | Status: DC | PRN
  Administered 2022-09-15: 10 mg via ORAL
  Administered 2022-09-15: 5 mg via ORAL
  Filled 2022-09-15: qty 2
  Filled 2022-09-15: qty 1

## 2022-09-15 MED ORDER — PROPOFOL 500 MG/50ML IV EMUL
INTRAVENOUS | Status: AC
  Filled 2022-09-15: qty 50

## 2022-09-15 MED ORDER — ACETAMINOPHEN 500 MG PO TABS
1000.0000 mg | ORAL_TABLET | Freq: Once | ORAL | Status: DC
  Filled 2022-09-15: qty 2

## 2022-09-15 MED ORDER — SODIUM CHLORIDE 0.9% FLUSH
INTRAVENOUS | Status: DC | PRN
  Administered 2022-09-15: 30 mL

## 2022-09-15 SURGICAL SUPPLY — 73 items
ADH SKN CLS APL DERMABOND .7 (GAUZE/BANDAGES/DRESSINGS) ×2
APL PRP STRL LF DISP 70% ISPRP (MISCELLANEOUS) ×2
BAG COUNTER SPONGE SURGICOUNT (BAG) IMPLANT
BAG SPEC THK2 15X12 ZIP CLS (MISCELLANEOUS)
BAG SPNG CNTER NS LX DISP (BAG)
BAG ZIPLOCK 12X15 (MISCELLANEOUS) IMPLANT
BATTERY INSTRU NAVIGATION (MISCELLANEOUS) ×6 IMPLANT
BLADE SAW RECIPROCATING 77.5 (BLADE) ×2 IMPLANT
BNDG ELASTIC 4X5.8 VLCR STR LF (GAUZE/BANDAGES/DRESSINGS) ×2 IMPLANT
BNDG ELASTIC 6X5.8 VLCR STR LF (GAUZE/BANDAGES/DRESSINGS) ×2 IMPLANT
BSPLAT TIB 5D D CMNT STM LT (Knees) ×1 IMPLANT
BTRY SRG DRVR LF (MISCELLANEOUS) ×3
CEMENT BONE R 1X40 (Cement) IMPLANT
CHLORAPREP W/TINT 26 (MISCELLANEOUS) ×4 IMPLANT
COMP FEM PS KNEE NRW 7 LT (Joint) ×1 IMPLANT
COMPONENT FEM PS KNEE NRW 7 LT (Joint) IMPLANT
COVER SURGICAL LIGHT HANDLE (MISCELLANEOUS) ×2 IMPLANT
DERMABOND ADVANCED .7 DNX12 (GAUZE/BANDAGES/DRESSINGS) ×4 IMPLANT
DRAPE INCISE IOBAN 66X45 STRL (DRAPES) ×2 IMPLANT
DRAPE SHEET LG 3/4 BI-LAMINATE (DRAPES) ×6 IMPLANT
DRAPE U-SHAPE 47X51 STRL (DRAPES) ×2 IMPLANT
DRSG AQUACEL AG ADV 3.5X10 (GAUZE/BANDAGES/DRESSINGS) ×2 IMPLANT
ELECT BLADE TIP CTD 4 INCH (ELECTRODE) ×2 IMPLANT
ELECT REM PT RETURN 15FT ADLT (MISCELLANEOUS) ×2 IMPLANT
GAUZE SPONGE 4X4 12PLY STRL (GAUZE/BANDAGES/DRESSINGS) ×2 IMPLANT
GLOVE BIO SURGEON STRL SZ7 (GLOVE) ×2 IMPLANT
GLOVE BIO SURGEON STRL SZ8.5 (GLOVE) ×4 IMPLANT
GLOVE BIOGEL PI IND STRL 7.5 (GLOVE) ×2 IMPLANT
GLOVE BIOGEL PI IND STRL 8.5 (GLOVE) ×2 IMPLANT
GOWN SPEC L3 XXLG W/TWL (GOWN DISPOSABLE) ×2 IMPLANT
GOWN STRL REUS W/ TWL XL LVL3 (GOWN DISPOSABLE) ×2 IMPLANT
GOWN STRL REUS W/TWL XL LVL3 (GOWN DISPOSABLE) ×1
HANDPIECE INTERPULSE COAX TIP (DISPOSABLE) ×1
HDLS TROCR DRIL PIN KNEE 75 (PIN) ×1
HOLDER FOLEY CATH W/STRAP (MISCELLANEOUS) ×2 IMPLANT
HOOD PEEL AWAY T7 (MISCELLANEOUS) ×6 IMPLANT
KIT TURNOVER KIT A (KITS) IMPLANT
LINER TIB ASF PS CD/6-7 11 LT (Liner) IMPLANT
MARKER SKIN DUAL TIP RULER LAB (MISCELLANEOUS) ×2 IMPLANT
NDL SAFETY ECLIP 18X1.5 (MISCELLANEOUS) ×2 IMPLANT
NDL SPNL 18GX3.5 QUINCKE PK (NEEDLE) ×2 IMPLANT
NEEDLE SPNL 18GX3.5 QUINCKE PK (NEEDLE) ×1 IMPLANT
NS IRRIG 1000ML POUR BTL (IV SOLUTION) ×2 IMPLANT
PACK TOTAL KNEE CUSTOM (KITS) ×2 IMPLANT
PADDING CAST ABS COTTON 6X4 NS (CAST SUPPLIES) IMPLANT
PADDING CAST COTTON 6X4 STRL (CAST SUPPLIES) ×2 IMPLANT
PIN DRILL HDLS TROCAR 75 4PK (PIN) IMPLANT
PROTECTOR NERVE ULNAR (MISCELLANEOUS) ×2 IMPLANT
SAW OSC TIP CART 19.5X105X1.3 (SAW) ×2 IMPLANT
SCREW FEMALE HEX FIX 25X2.5 (ORTHOPEDIC DISPOSABLE SUPPLIES) IMPLANT
SEALER BIPOLAR AQUA 6.0 (INSTRUMENTS) ×2 IMPLANT
SET HNDPC FAN SPRY TIP SCT (DISPOSABLE) ×2 IMPLANT
SET PAD KNEE POSITIONER (MISCELLANEOUS) ×2 IMPLANT
SOLUTION PRONTOSAN WOUND 350ML (IRRIGATION / IRRIGATOR) IMPLANT
SPIKE FLUID TRANSFER (MISCELLANEOUS) ×4 IMPLANT
STEM POLY PAT PLY 35M KNEE (Knees) IMPLANT
STEM TIB ST PERS 14+30 (Stem) IMPLANT
STEM TIBIA 5 DEG SZ D L KNEE (Knees) IMPLANT
SUT MNCRL AB 3-0 PS2 18 (SUTURE) ×2 IMPLANT
SUT MNCRL AB 4-0 PS2 18 (SUTURE) IMPLANT
SUT MON AB 2-0 CT1 36 (SUTURE) ×2 IMPLANT
SUT STRATAFIX PDO 1 14 VIOLET (SUTURE) ×1
SUT STRATFX PDO 1 14 VIOLET (SUTURE) ×1
SUT VIC AB 1 CTX 36 (SUTURE) ×2
SUT VIC AB 1 CTX36XBRD ANBCTR (SUTURE) ×4 IMPLANT
SUT VIC AB 2-0 CT1 27 (SUTURE)
SUT VIC AB 2-0 CT1 TAPERPNT 27 (SUTURE) IMPLANT
SUTURE STRATFX PDO 1 14 VIOLET (SUTURE) ×2 IMPLANT
TIBIA STEM 5 DEG SZ D L KNEE (Knees) ×1 IMPLANT
TRAY FOLEY MTR SLVR 16FR STAT (SET/KITS/TRAYS/PACK) IMPLANT
TUBE SUCTION HIGH CAP CLEAR NV (SUCTIONS) ×2 IMPLANT
WATER STERILE IRR 1000ML POUR (IV SOLUTION) ×4 IMPLANT
WRAP KNEE MAXI GEL POST OP (GAUZE/BANDAGES/DRESSINGS) IMPLANT

## 2022-09-15 NOTE — Plan of Care (Signed)
  Problem: Nutrition: Goal: Adequate nutrition will be maintained Outcome: Progressing   

## 2022-09-15 NOTE — Anesthesia Procedure Notes (Signed)
Spinal  Patient location during procedure: OR End time: 09/15/2022 12:43 PM Reason for block: surgical anesthesia Staffing Performed: resident/CRNA  Resident/CRNA: Noralyn Pick D, CRNA Performed by: Ayleah Hofmeister, Clinical cytogeneticist D, CRNA Authorized by: Nolon Nations, MD   Preanesthetic Checklist Completed: patient identified, IV checked, site marked, risks and benefits discussed, surgical consent, monitors and equipment checked, pre-op evaluation and timeout performed Spinal Block Patient position: sitting Prep: DuraPrep Patient monitoring: heart rate, continuous pulse ox and blood pressure Approach: midline Location: L3-4 Injection technique: single-shot Needle Needle type: Pencan  Needle gauge: 24 G Needle length: 9 cm Assessment Sensory level: T6 Events: CSF return

## 2022-09-15 NOTE — Plan of Care (Signed)
  Problem: Education: Goal: Knowledge of the prescribed therapeutic regimen will improve Outcome: Progressing Goal: Individualized Educational Video(s) Outcome: Progressing   Problem: Activity: Goal: Ability to avoid complications of mobility impairment will improve Outcome: Progressing Goal: Range of joint motion will improve Outcome: Progressing   Problem: Clinical Measurements: Goal: Postoperative complications will be avoided or minimized Outcome: Progressing   Problem: Pain Management: Goal: Pain level will decrease with appropriate interventions Outcome: Progressing   Problem: Skin Integrity: Goal: Will show signs of wound healing Outcome: Progressing   Problem: Education: Goal: Knowledge of the prescribed therapeutic regimen will improve Outcome: Progressing Goal: Individualized Educational Video(s) Outcome: Progressing   Problem: Activity: Goal: Ability to avoid complications of mobility impairment will improve Outcome: Progressing Goal: Range of joint motion will improve Outcome: Progressing   Problem: Clinical Measurements: Goal: Postoperative complications will be avoided or minimized Outcome: Progressing   Problem: Pain Management: Goal: Pain level will decrease with appropriate interventions Outcome: Progressing   Problem: Skin Integrity: Goal: Will show signs of wound healing Outcome: Progressing   Problem: Education: Goal: Knowledge of General Education information will improve Description: Including pain rating scale, medication(s)/side effects and non-pharmacologic comfort measures Outcome: Progressing   Problem: Clinical Measurements: Goal: Ability to maintain clinical measurements within normal limits will improve Outcome: Progressing Goal: Will remain free from infection Outcome: Progressing Goal: Diagnostic test results will improve Outcome: Progressing Goal: Respiratory complications will improve Outcome: Progressing Goal:  Cardiovascular complication will be avoided Outcome: Progressing   Problem: Activity: Goal: Risk for activity intolerance will decrease Outcome: Progressing   Problem: Nutrition: Goal: Adequate nutrition will be maintained Outcome: Progressing   Problem: Coping: Goal: Level of anxiety will decrease Outcome: Progressing   Problem: Elimination: Goal: Will not experience complications related to bowel motility Outcome: Progressing Goal: Will not experience complications related to urinary retention Outcome: Progressing   Problem: Pain Managment: Goal: General experience of comfort will improve Outcome: Progressing   Problem: Safety: Goal: Ability to remain free from injury will improve Outcome: Progressing   Problem: Skin Integrity: Goal: Risk for impaired skin integrity will decrease Outcome: Progressing

## 2022-09-15 NOTE — Discharge Instructions (Signed)
 Dr. Brian Swinteck Total Joint Specialist [facility] [address] [place], [place] [address] [phone]  TOTAL KNEE REPLACEMENT POSTOPERATIVE DIRECTIONS    Knee Rehabilitation, Guidelines Following Surgery  Results after knee surgery are often greatly improved when you follow the exercise, range of motion and muscle strengthening exercises prescribed by your doctor. Safety measures are also important to protect the knee from further injury. Any time any of these exercises cause you to have increased pain or swelling in your knee joint, decrease the amount until you are comfortable again and slowly increase them. If you have problems or questions, call your caregiver or physical therapist for advice.   WEIGHT BEARING Weight bearing as tolerated with assist device (walker, cane, etc) as directed, use it as long as suggested by your surgeon or therapist, typically at least 4-6 weeks.  HOME CARE INSTRUCTIONS  Remove items at home which could result in a fall. This includes throw rugs or furniture in walking pathways.  Continue medications as instructed at time of discharge. You may have some home medications which will be placed on hold until you complete the course of blood thinner medication.  You may start showering once you are discharged home but do not submerge the incision under water. Just pat the incision dry and apply a dry gauze dressing on daily. Walk with walker as instructed.  You may resume a sexual relationship in one month or when given the OK by your doctor.  Use walker as long as suggested by your caregivers. Avoid periods of inactivity such as sitting longer than an hour when not asleep. This helps prevent blood clots.  You may put full weight on your legs and walk as much as is comfortable.  You may return to work once you are cleared by your doctor.  Do not drive a car for 6 weeks or until released by you surgeon.  Do not drive while  taking narcotics.  Wear the elastic stockings for three weeks following surgery during the day but you may remove then at night. Make sure you keep all of your appointments after your operation with all of your doctors and caregivers. You should call the office at the above phone number and make an appointment for approximately two weeks after the date of your surgery. Do not remove your surgical dressing. The dressing is waterproof; you may take showers in 3 days, but do not take tub baths or submerge the dressing. Please pick up a stool softener and laxative for home use as long as you are requiring pain medications. ICE to the affected knee every three hours for 30 minutes at a time and then as needed for pain and swelling.  Continue to use ice on the knee for pain and swelling from surgery. You may notice swelling that will progress down to the foot and ankle.  This is normal after surgery.  Elevate the leg when you are not up walking on it.   It is important for you to complete the blood thinner medication as prescribed by your doctor. Continue to use the breathing machine which will help keep your temperature down.  It is common for your temperature to cycle up and down following surgery, especially at night when you are not up moving around and exerting yourself.  The breathing machine keeps your lungs expanded and your temperature down.  RANGE OF MOTION AND STRENGTHENING EXERCISES  Rehabilitation of the knee is important following a knee injury or an   operation. After just a few days of immobilization, the muscles of the thigh which control the knee become weakened and shrink (atrophy). Knee exercises are designed to build up the tone and strength of the thigh muscles and to improve knee motion. Often times heat used for twenty to thirty minutes before working out will loosen up your tissues and help with improving the range of motion but do not use heat for the first two weeks following surgery.  These exercises can be done on a training (exercise) mat, on the floor, on a table or on a bed. Use what ever works the best and is most comfortable for you Knee exercises include:  Leg Lifts - While your knee is still immobilized in a splint or cast, you can do straight leg raises. Lift the leg to 60 degrees, hold for 3 sec, and slowly lower the leg. Repeat 10-20 times 2-3 times daily. Perform this exercise against resistance later as your knee gets better.  Quad and Hamstring Sets - Tighten up the muscle on the front of the thigh (Quad) and hold for 5-10 sec. Repeat this 10-20 times hourly. Hamstring sets are done by pushing the foot backward against an object and holding for 5-10 sec. Repeat as with quad sets.  A rehabilitation program following serious knee injuries can speed recovery and prevent re-injury in the future due to weakened muscles. Contact your doctor or a physical therapist for more information on knee rehabilitation.   POST-OPERATIVE OPIOID TAPER INSTRUCTIONS: It is important to wean off of your opioid medication as soon as possible. If you do not need pain medication after your surgery it is ok to stop day one. Opioids include: Codeine, Hydrocodone(Norco, Vicodin), Oxycodone(Percocet, oxycontin) and hydromorphone amongst others.  Long term and even short term use of opiods can cause: Increased pain response Dependence Constipation Depression Respiratory depression And more.  Withdrawal symptoms can include Flu like symptoms Nausea, vomiting And more Techniques to manage these symptoms Hydrate well Eat regular healthy meals Stay active Use relaxation techniques(deep breathing, meditating, yoga) Do Not substitute Alcohol to help with tapering If you have been on opioids for less than two weeks and do not have pain than it is ok to stop all together.  Plan to wean off of opioids This plan should start within one week post op of your joint replacement. Maintain the same  interval or time between taking each dose and first decrease the dose.  Cut the total daily intake of opioids by one tablet each day Next start to increase the time between doses. The last dose that should be eliminated is the evening dose.    SKILLED REHAB INSTRUCTIONS: If the patient is transferred to a skilled rehab facility following release from the hospital, a list of the current medications will be sent to the facility for the patient to continue.  When discharged from the skilled rehab facility, please have the facility set up the patient's Home Health Physical Therapy prior to being released. Also, the skilled facility will be responsible for providing the patient with their medications at time of release from the facility to include their pain medication, the muscle relaxants, and their blood thinner medication. If the patient is still at the rehab facility at time of the two week follow up appointment, the skilled rehab facility will also need to assist the patient in arranging follow up appointment in our office and any transportation needs.  MAKE SURE YOU:  Understand these instructions.  Will watch   your condition.  Will get help right away if you are not doing well or get worse.    Pick up stool softner and laxative for home use following surgery while on pain medications. Do NOT remove your dressing. You may shower.  Do not take tub baths or submerge incision under water. May shower starting three days after surgery. Please use a clean towel to pat the incision dry following showers. Continue to use ice for pain and swelling after surgery. Do not use any lotions or creams on the incision until instructed by your surgeon.  

## 2022-09-15 NOTE — Interval H&P Note (Signed)
History and Physical Interval Note:  09/15/2022 10:22 AM  [name]  has presented today for surgery, with the diagnosis of Left knee osteoarthritis.  The various methods of treatment have been discussed with the patient and family. After consideration of risks, benefits and other options for treatment, the patient has consented to  Procedure(s): COMPUTER ASSISTED TOTAL KNEE ARTHROPLASTY (Left) as a surgical intervention.  The patient's history has been reviewed, patient examined, no change in status, stable for surgery.  I have reviewed the patient's chart and labs.  Questions were answered to the patient's satisfaction.     Hilton Cork Alaila Pillard

## 2022-09-15 NOTE — Progress Notes (Signed)
Patient declines nocturnal CPAP. She conveys that she uses it home but not every night. Order changed to prn. She is aware that she may request a machine at any time, should she change her mind and wish to use one.

## 2022-09-15 NOTE — Anesthesia Postprocedure Evaluation (Signed)
Anesthesia Post Note  Patient: [name]  Procedure(s) Performed: COMPUTER ASSISTED TOTAL KNEE ARTHROPLASTY (Left: Knee)     Patient location during evaluation: PACU Anesthesia Type: Spinal Level of consciousness: sedated and patient cooperative Pain management: pain level controlled Vital Signs Assessment: post-procedure vital signs reviewed and stable Respiratory status: spontaneous breathing Cardiovascular status: stable Anesthetic complications: no   No notable events documented.  Last Vitals:  Vitals:   09/15/22 1630 09/15/22 1712  BP: 118/77 117/77  Pulse: 66 68  Resp: 14   Temp:    SpO2: 96% 99%    Last Pain:  Vitals:   09/15/22 1708  TempSrc:   PainSc: 0-No pain                 Nolon Nations

## 2022-09-15 NOTE — Transfer of Care (Signed)
Immediate Anesthesia Transfer of Care Note  Patient: [name]  Procedure(s) Performed: COMPUTER ASSISTED TOTAL KNEE ARTHROPLASTY (Left: Knee)  Patient Location: PACU  Anesthesia Type:Regional and Spinal  Level of Consciousness: awake, alert , and oriented  Airway & Oxygen Therapy: Patient Spontanous Breathing and Patient connected to face mask oxygen  Post-op Assessment: Report given to RN and Post -op Vital signs reviewed and stable  Post vital signs: Reviewed and stable  Last Vitals:  Vitals Value Taken Time  BP 97/53 09/15/22 1550  Temp    Pulse 73 09/15/22 1553  Resp 14 09/15/22 1553  SpO2 99 % 09/15/22 1553  Vitals shown include unvalidated device data.  Last Pain:  Vitals:   09/15/22 1048  TempSrc:   PainSc: 0-No pain      Patients Stated Pain Goal: 4 (45/99/77 4142)  Complications: No notable events documented.

## 2022-09-15 NOTE — Op Note (Signed)
OPERATIVE REPORT  SURGEON: Rod Can, MD   ASSISTANT: Larene Pickett, PA-C  PREOPERATIVE DIAGNOSIS: Primary Left knee arthritis.   POSTOPERATIVE DIAGNOSIS: Primary Left knee arthritis.   PROCEDURE: Computer assisted Left total knee arthroplasty.   IMPLANTS: Zimmer Persona PPS Cementless CR femur, size 7 Narrow. Persona 5 Degree cemented tibia, size D with 14 x 300 mm stem extender. Vivacit-E polyethelyene insert, size 11 mm, MC. 3 button all poly patella, size 35 mm. Refobacin bone cement.  ANESTHESIA:  MAC, Regional, and Spinal  TOURNIQUET TIME: Not utilized.   ESTIMATED BLOOD LOSS:-100 mL    ANTIBIOTICS: 2g Ancef.  DRAINS: None.  COMPLICATIONS: None   CONDITION: PACU - hemodynamically stable.   BRIEF CLINICAL NOTE: [name] is a 61 y.o. female with a long-standing history of Left knee arthritis. After failing conservative management, the patient was indicated for total knee arthroplasty. The risks, benefits, and alternatives to the procedure were explained, and the patient elected to proceed.  PROCEDURE IN DETAIL: Adductor canal block was obtained in the pre-op holding area. Once inside the operative room, spinal anesthesia was obtained, and a foley catheter was inserted. The patient was then positioned and the lower extremity was prepped and draped in the normal sterile surgical fashion.  A time-out was called verifying side and site of surgery. The patient received IV antibiotics within 60 minutes of beginning the procedure. A tourniquet was not utilized.   An anterior approach to the knee was performed utilizing a midvastus arthrotomy. A medial release was performed and the patellar fat pad was excised. Stryker imageless navigation was used to cut the distal femur perpendicular to the mechanical axis. A freehand patellar resection was performed, and the patella was sized an prepared with 3 lug holes.  Nagivation was used to make a neutral proximal tibia resection,  taking 9 mm of bone from the less affected lateral side with 3 degrees of slope. The menisci were excised. A spacer block was placed, and the alignment and balance in extension were confirmed.   The distal femur was sized using the 3-degree external rotation guide referencing the posterior femoral cortex. The appropriate 4-in-1 cutting block was pinned into place. Rotation was checked using Whiteside's line, the epicondylar axis, and then confirmed with a spacer block in flexion. The remaining femoral cuts were performed, taking care to protect the MCL.  The tibia was sized and the trial tray was pinned into place. The remaining trail components were inserted. The knee was stable to varus and valgus stress through a full range of motion. The patella tracked centrally, and the PCL was well balanced. The trial components were removed, and the proximal tibial surface was prepared. The was a small noncontained defect in the far medial aspect of the medial tibial. I elected to place a short stem extender. Small drill holes were made in the sclerotic subchondral bone.The cut bony surfaces were irrigated with pulse lavage. Final components were cemented into place and excess cement was cleared. The trial insert was placed, and the knee was brought into extension while the cement polymerized. Once the cement was hard, the knee was tested for a final time and found to be well balanced. The trial insert was exchanged for the real polyethylene insert.    The wound was copiously irrigated with Irrisept solution and normal saline using pule lavage.  Marcaine solution was injected into the periarticular soft tissue.  The wound was closed in layers using #1 Vicryl and Stratafix for the fascia, 2-0  Vicryl for the subcutaneous fat, 2-0 Monocryl for the deep dermal layer, 3-0 running Monocryl subcuticular Stitch, and 4-0 Monocryl stay sutures at both ends of the wound. Dermabond was applied to the skin.  Once the glue was fully  dried, an Aquacell Ag and compressive dressing were applied.  The patient was transported to the recovery room in stable condition.  Sponge, needle, and instrument counts were correct at the end of the case x2.  The patient tolerated the procedure well and there were no known complications.  Please note that a surgical assistant was a medical necessity for this procedure in order to perform it in a safe and expeditious manner. Surgical assistant was necessary to retract the ligaments and vital neurovascular structures to prevent injury to them and also necessary for proper positioning of the limb to allow for anatomic placement of the prosthesis.

## 2022-09-15 NOTE — Progress Notes (Signed)
Pt has arrived to 1326 from PACU s/p L TKA.  Report accepted from [place],  [place] is alert and oriented.  Husband at bedside. Pt has no pain at this time. Room orientation completed with call bell placed at bedside.  Care continues.

## 2022-09-15 NOTE — Anesthesia Procedure Notes (Signed)
Anesthesia Regional Block: Adductor canal block   Pre-Anesthetic Checklist: , timeout performed,  Correct Patient, Correct Site, Correct Laterality,  Correct Procedure, Correct Position, site marked,  Risks and benefits discussed,  Surgical consent,  Pre-op evaluation,  At surgeon's request and post-op pain management  Laterality: Lower and Left  Prep: chloraprep       Needles:  Injection technique: Single-shot  Needle Type: Stimiplex     Needle Length: 9cm  Needle Gauge: 21     Additional Needles:   Procedures:,,,, ultrasound used (permanent image in chart),,    Narrative:  Start time: 09/15/2022 11:32 AM End time: 09/15/2022 11:52 AM Injection made incrementally with aspirations every 5 mL.  Performed by: Personally  Anesthesiologist: Nolon Nations, MD  Additional Notes: BP cuff, EKG monitors applied. Sedation begun. Artery and nerve location verified with ultrasound. Anesthetic injected incrementally (96m), slowly, and after negative aspirations under direct u/s guidance. Good fascial/perineural spread. Tolerated well.

## 2022-09-16 ENCOUNTER — Encounter (HOSPITAL_COMMUNITY): Payer: Self-pay | Admitting: Orthopedic Surgery

## 2022-09-16 DIAGNOSIS — M1712 Unilateral primary osteoarthritis, left knee: Secondary | ICD-10-CM | POA: Diagnosis not present

## 2022-09-16 LAB — BASIC METABOLIC PANEL
Anion gap: 5 (ref 5–15)
BUN: 22 mg/dL (ref 8–23)
CO2: 28 mmol/L (ref 22–32)
Calcium: 8.6 mg/dL — ABNORMAL LOW (ref 8.9–10.3)
Chloride: 108 mmol/L (ref 98–111)
Creatinine, Ser: 0.94 mg/dL (ref 0.44–1.00)
GFR, Estimated: >60 mL/min (ref >60)
Glucose, Bld: 146 mg/dL — ABNORMAL HIGH (ref 70–99)
Potassium: 4.6 mmol/L (ref 3.5–5.1)
Sodium: 141 mmol/L (ref 135–145)

## 2022-09-16 LAB — CBC
HCT: 34.8 % — ABNORMAL LOW (ref 36.0–46.0)
Hemoglobin: 11.2 g/dL — ABNORMAL LOW (ref 12.0–15.0)
MCH: 27.9 pg (ref 26.0–34.0)
MCHC: 32.2 g/dL (ref 30.0–36.0)
MCV: 86.6 fL (ref 80.0–100.0)
Platelets: 262 10*3/uL (ref 150–400)
RBC: 4.02 MIL/uL (ref 3.87–5.11)
RDW: 13.1 % (ref 11.5–15.5)
WBC: 10.1 10*3/uL (ref 4.0–10.5)
nRBC: 0 % (ref 0.0–0.2)

## 2022-09-16 MED ORDER — ASPIRIN 81 MG PO CHEW: 81.0000 mg | CHEWABLE_TABLET | Freq: Two times a day (BID) | ORAL | 0 refills | Status: AC

## 2022-09-16 MED ORDER — SENNA 8.6 MG PO TABS: 2.0000 | ORAL_TABLET | Freq: Every day | ORAL | 0 refills | Status: AC

## 2022-09-16 MED ORDER — POLYETHYLENE GLYCOL 3350 17 G PO PACK: 17.0000 g | PACK | Freq: Every day | ORAL | 0 refills | Status: AC | PRN

## 2022-09-16 MED ORDER — METHYLPHENIDATE HCL ER (LA) 10 MG PO CP24
30.0000 mg | ORAL_CAPSULE | Freq: Every day | ORAL | Status: DC
  Administered 2022-09-16: 30 mg via ORAL
  Filled 2022-09-16: qty 3

## 2022-09-16 MED ORDER — HYDROMORPHONE HCL 2 MG PO TABS: 2.0000 mg | ORAL_TABLET | ORAL | 0 refills | Status: AC | PRN

## 2022-09-16 MED ORDER — ONDANSETRON HCL 4 MG PO TABS: 4.0000 mg | ORAL_TABLET | Freq: Three times a day (TID) | ORAL | 0 refills | Status: AC | PRN

## 2022-09-16 MED ORDER — DOCUSATE SODIUM 100 MG PO CAPS: 100.0000 mg | ORAL_CAPSULE | Freq: Two times a day (BID) | ORAL | 0 refills | Status: AC

## 2022-09-16 NOTE — Progress Notes (Addendum)
    Subjective:  Patient reports pain as mild to moderate.  Denies N/V/CP/SOB/Abd pain. She reports some difficulty with pain control overnight, she states it is controlled this morning. She denies any tingling or numbness in LE bilaterally.   Objective:   VITALS:   Vitals:   09/15/22 1954 09/15/22 2203 09/16/22 0157 09/16/22 0631  BP: 128/80 119/71 113/68 109/63  Pulse: 88 64 64 63  Resp: '17 17 17 16  '$ Temp: 98.2 F (36.8 C) 97.8 F (36.6 C) 98.3 F (36.8 C) 97.8 F (36.6 C)  TempSrc:  Oral Oral Oral  SpO2: 95% 93% 92%   Weight:      Height:        Patient lying comfortably in bed. NAD.  Neurologically intact ABD soft Neurovascular intact Sensation intact distally Intact pulses distally Dorsiflexion/Plantar flexion intact Incision: dressing C/D/I No cellulitis present Compartment soft   Lab Results  Component Value Date   WBC 10.1 09/16/2022   HGB 11.2 (L) 09/16/2022   HCT 34.8 (L) 09/16/2022   MCV 86.6 09/16/2022   PLT 262 09/16/2022   BMET    Component Value Date/Time   NA 141 09/16/2022 0324   NA 145 (H) 04/01/2015 1615   K 4.6 09/16/2022 0324   CL 108 09/16/2022 0324   CO2 28 09/16/2022 0324   GLUCOSE 146 (H) 09/16/2022 0324   BUN 22 09/16/2022 0324   BUN 12 04/01/2015 1615   CREATININE 0.94 09/16/2022 0324   CALCIUM 8.6 (L) 09/16/2022 0324   GFRNONAA >60 09/16/2022 0324     Assessment/Plan: 1 Day Post-Op   Principal Problem:   Osteoarthritis of left knee   WBAT with walker DVT ppx: Aspirin, SCDs, TEDS PO pain control PT/OT: PT has not seen yet. PT to come by today.  Dispo: D/c home with OPPT once cleared with PT. Patient will follow-up in office in 2 weeks.    Charlott Rakes, PA-C 09/16/2022, 7:18 AM  [facility]  [place] [address], [address], [place], [place] [address] Phone: [phone] [email or web address] Facebook  [employer]

## 2022-09-16 NOTE — TOC Transition Note (Signed)
Transition of Care [facility]) - CM/SW Discharge Note   Patient Details  Name: [name] MRN: [id number] Date of Birth: Nov 15, 1960  Transition of [facility]) CM/SW Contact:  Vassie Moselle, [place] Phone Number: 09/16/2022, 11:25 AM   Clinical Narrative:    Confirmed plan for OPPT at [facility]. Pt shares she is very familiar with their program as she has used their services multiple times. Pt has RW at home and has no further DME needs.    Final next level of care: OP Rehab Barriers to Discharge: No Barriers Identified   Patient Goals and CMS Choice Patient states their goals for this hospitalization and ongoing recovery are:: To return home CMS Medicare.gov Compare Post Acute Care list provided to:: Patient Choice offered to / list presented to : Patient  Discharge Placement                       Discharge Plan and Services In-house Referral: NA Discharge Planning Services: CM Consult Post Acute Care Choice: NA          DME Arranged: N/A DME Agency: NA                  Social Determinants of Health (SDOH) Interventions     Readmission Risk Interventions     No data to display

## 2022-09-16 NOTE — Plan of Care (Signed)
Pt ready to DC home with husband 

## 2022-09-16 NOTE — Evaluation (Signed)
Physical Therapy Evaluation Patient Details Name: [name] MRN: [id number] DOB: 1961-08-04 Today's Date: 09/16/2022  History of Present Illness  Pt s/p L TKR and with hx of Bil TSR (most recent R in 4/23), breast CA s/p mastectomy, depression and Fibromyalgia  Clinical Impression  Pt s/p L TKR and presents with decreased L LE strength/ROM and post op pain limiting functional mobility.  Pt should progress to dc home with family assist and has first OP PT scheduled for 09/20/22.     Recommendations for follow up therapy are one component of a multi-disciplinary discharge planning process, led by the attending physician.  Recommendations may be updated based on patient status, additional functional criteria and insurance authorization.  Follow Up Recommendations Follow physician's recommendations for discharge plan and follow up therapies      Assistance Recommended at Discharge Intermittent Supervision/Assistance  Patient can return home with the following  A little help with walking and/or transfers;A little help with bathing/dressing/bathroom;Assistance with cooking/housework;Assist for transportation;Help with stairs or ramp for entrance    Equipment Recommendations None recommended by PT  Recommendations for Other Services       Functional Status Assessment Patient has had a recent decline in their functional status and demonstrates the ability to make significant improvements in function in a reasonable and predictable amount of time.     Precautions / Restrictions Precautions Precautions: Knee;Fall Restrictions Weight Bearing Restrictions: No Other Position/Activity Restrictions: WBAT      Mobility  Bed Mobility Overal bed mobility: Needs Assistance Bed Mobility: Supine to Sit     Supine to sit: Min guard     General bed mobility comments: cues for sequence and use of R LE to self assist    Transfers Overall transfer level: Needs assistance Equipment  used: Rolling walker (2 wheels) Transfers: Sit to/from Stand Sit to Stand: Min assist, Min guard           General transfer comment: cues for LE management and use of UEs to self assist    Ambulation/Gait Ambulation/Gait assistance: Min assist, Min guard Gait Distance (Feet): 70 Feet Assistive device: Rolling walker (2 wheels) Gait Pattern/deviations: Step-to pattern, Decreased step length - right, Decreased step length - left, Shuffle, Trunk flexed Gait velocity: decr     General Gait Details: cues for sequence, posture and position from [employer]            Wheelchair Mobility    Modified Rankin (Stroke Patients Only)       Balance Overall balance assessment: Needs assistance Sitting-balance support: Feet supported Sitting balance-Leahy Scale: Good     Standing balance support: Bilateral upper extremity supported Standing balance-Leahy Scale: Poor                               Pertinent Vitals/Pain Pain Assessment Pain Assessment: 0-10 Pain Score: 5  Pain Location: L knee Pain Descriptors / Indicators: Aching, Burning, Sore Pain Intervention(s): Limited activity within patient's tolerance, Monitored during session, Premedicated before session, Ice applied    Home Living Family/patient expects to be discharged to:: Private residence Living Arrangements: Spouse/significant other Available Help at Discharge: Family;Available 24 hours/day Type of Home: House Home Access: Stairs to enter Entrance Stairs-Rails: Psychiatric nurse of Steps: 6   Home Layout: One level Home Equipment: Conservation officer, nature (2 wheels);Cane - single point      Prior Function Prior Level of Function : Independent/Modified Independent  Mobility Comments: using RW 2* knee buckling       Hand Dominance   Dominant Hand: Right    Extremity/Trunk Assessment   Upper Extremity Assessment Upper Extremity Assessment: Overall WFL for  tasks assessed    Lower Extremity Assessment Lower Extremity Assessment: LLE deficits/detail LLE Deficits / Details: 3-/5 quads with AAROM at knee -5 - 45    Cervical / Trunk Assessment Cervical / Trunk Assessment: Normal  Communication   Communication: No difficulties  Cognition Arousal/Alertness: Awake/alert Behavior During Therapy: WFL for tasks assessed/performed Overall Cognitive Status: Within Functional Limits for tasks assessed                                          General Comments      Exercises Total Joint Exercises Ankle Circles/Pumps: AROM, Both, Supine, 20 reps Quad Sets: AROM, Both, 10 reps, Supine Heel Slides: AAROM, Left, 10 reps, Supine Straight Leg Raises: AAROM, Left, 10 reps, Supine   Assessment/Plan    PT Assessment Patient needs continued PT services  PT Problem List Decreased strength;Decreased range of motion;Decreased activity tolerance;Decreased balance;Decreased mobility;Decreased knowledge of use of DME;Pain       PT Treatment Interventions DME instruction;Gait training;Stair training;Functional mobility training;Therapeutic activities;Therapeutic exercise;Patient/family education    PT Goals (Current goals can be found in the Care Plan section)  Acute Rehab PT Goals Patient Stated Goal: Regain IND PT Goal Formulation: With patient Time For Goal Achievement: 09/23/22 Potential to Achieve Goals: Good    Frequency 7X/week     Co-evaluation               AM-PAC PT "6 Clicks" Mobility  Outcome Measure Help needed turning from your back to your side while in a flat bed without using bedrails?: A Little Help needed moving from lying on your back to sitting on the side of a flat bed without using bedrails?: A Little Help needed moving to and from a bed to a chair (including a wheelchair)?: A Little Help needed standing up from a chair using your arms (e.g., wheelchair or bedside chair)?: A Little Help needed to  walk in hospital room?: A Little Help needed climbing 3-5 steps with a railing? : A Lot 6 Click Score: 17    End of Session Equipment Utilized During Treatment: Gait belt Activity Tolerance: Patient tolerated treatment well Patient left: in chair;with call bell/phone within reach;with chair alarm set Nurse Communication: Mobility status PT Visit Diagnosis: Difficulty in walking, not elsewhere classified (R26.2)    Time: 8675-4492 PT Time Calculation (min) (ACUTE ONLY): 37 min   Charges:   PT Evaluation $PT Eval Low Complexity: 1 Low PT Treatments $Therapeutic Exercise: 8-22 mins        Debe Coder PT Acute Rehabilitation Services Pager [phone] Office [phone]   Hosam Mcfetridge 09/16/2022, 10:23 AM

## 2022-09-16 NOTE — Progress Notes (Signed)
Physical Therapy Treatment Patient Details Name: [name] MRN: [id number] DOB: 03-02-1961 Today's Date: 09/16/2022   History of Present Illness Pt s/p L TKR and with hx of Bil TSR (most recent R in 4/23), breast CA s/p mastectomy, depression and Fibromyalgia    PT Comments    Pt motivated and progressing well with mobility.  Pt up to ambulate increased distance in hall, negotiated stairs and performed HEP with written instruction provided and reviewed.  Pt reports OP PT starting Monday 09/16/22.   Recommendations for follow up therapy are one component of a multi-disciplinary discharge planning process, led by the attending physician.  Recommendations may be updated based on patient status, additional functional criteria and insurance authorization.  Follow Up Recommendations  Follow physician's recommendations for discharge plan and follow up therapies     Assistance Recommended at Discharge Intermittent Supervision/Assistance  Patient can return home with the following A little help with walking and/or transfers;A little help with bathing/dressing/bathroom;Assistance with cooking/housework;Assist for transportation;Help with stairs or ramp for entrance   Equipment Recommendations  None recommended by PT    Recommendations for Other Services       Precautions / Restrictions Precautions Precautions: Knee;Fall Restrictions Weight Bearing Restrictions: No Other Position/Activity Restrictions: WBAT     Mobility  Bed Mobility Overal bed mobility: Needs Assistance Bed Mobility: Supine to Sit     Supine to sit: Min guard     General bed mobility comments: Pt up in chair and requests back to same    Transfers Overall transfer level: Needs assistance Equipment used: Rolling walker (2 wheels) Transfers: Sit to/from Stand Sit to Stand: Min guard, Supervision           General transfer comment: cues for LE management and use of UEs to self assist     Ambulation/Gait Ambulation/Gait assistance: Min guard, Supervision Gait Distance (Feet): 100 Feet Assistive device: Rolling walker (2 wheels) Gait Pattern/deviations: Step-to pattern, Decreased step length - right, Decreased step length - left, Shuffle, Trunk flexed Gait velocity: decr     General Gait Details: cues for sequence, posture and position from RW   Stairs Stairs: Yes Stairs assistance: Min assist Stair Management: One rail Left, Step to pattern, Sideways Number of Stairs: 2 General stair comments: pt demonstrates technique used prior to surgery with min difficulty and good safety awareness   Wheelchair Mobility    Modified Rankin (Stroke Patients Only)       Balance Overall balance assessment: Needs assistance Sitting-balance support: Feet supported Sitting balance-Leahy Scale: Good     Standing balance support: Bilateral upper extremity supported Standing balance-Leahy Scale: Poor                              Cognition Arousal/Alertness: Awake/alert Behavior During Therapy: WFL for tasks assessed/performed Overall Cognitive Status: Within Functional Limits for tasks assessed                                          Exercises Total Joint Exercises Ankle Circles/Pumps: AROM, Both, Supine, 20 reps Quad Sets: AROM, Both, 10 reps, Supine Heel Slides: AAROM, Left, 10 reps, Supine Straight Leg Raises: AAROM, Left, 10 reps, Supine Long Arc Quad: AAROM, 5 reps, Seated, Left    General Comments        Pertinent Vitals/Pain Pain Assessment Pain Assessment: 0-10 Pain Score:  4  Pain Location: L knee Pain Descriptors / Indicators: Aching, Burning, Sore Pain Intervention(s): Limited activity within patient's tolerance, Monitored during session, Premedicated before session, Ice applied    Home Living Family/patient expects to be discharged to:: Private residence Living Arrangements: Spouse/significant other Available Help  at Discharge: Family;Available 24 hours/day Type of Home: House Home Access: Stairs to enter Entrance Stairs-Rails: Psychiatric nurse of Steps: 6   Home Layout: One level Home Equipment: Conservation officer, nature (2 wheels);Cane - single point      Prior Function            PT Goals (current goals can now be found in the care plan section) Acute Rehab PT Goals Patient Stated Goal: Regain IND PT Goal Formulation: With patient Time For Goal Achievement: 09/23/22 Potential to Achieve Goals: Good Progress towards PT goals: Progressing toward goals    Frequency    7X/week      PT Plan Current plan remains appropriate    Co-evaluation              AM-PAC PT "6 Clicks" Mobility   Outcome Measure  Help needed turning from your back to your side while in a flat bed without using bedrails?: A Little Help needed moving from lying on your back to sitting on the side of a flat bed without using bedrails?: A Little Help needed moving to and from a bed to a chair (including a wheelchair)?: A Little Help needed standing up from a chair using your arms (e.g., wheelchair or bedside chair)?: A Little Help needed to walk in hospital room?: A Little Help needed climbing 3-5 steps with a railing? : A Lot 6 Click Score: 17    End of Session Equipment Utilized During Treatment: Gait belt Activity Tolerance: Patient tolerated treatment well Patient left: in chair;with call bell/phone within reach;with chair alarm set Nurse Communication: Mobility status PT Visit Diagnosis: Difficulty in walking, not elsewhere classified (R26.2)     Time: 0165-5374 PT Time Calculation (min) (ACUTE ONLY): 38 min  Charges:  $Gait Training: 8-22 mins $Therapeutic Exercise: 8-22 mins                     Debe Coder PT Acute Rehabilitation Services Pager [phone] Office [phone]    Fey Coghill 09/16/2022, 12:32 PM

## 2022-10-11 NOTE — Therapy (Signed)
OUTPATIENT PHYSICAL THERAPY LOWER EXTREMITY EVALUATION   Patient Name: [name] MRN: [id number] DOB:25-Aug-1961, 61 y.o., female Today's Date: 10/13/2022  END OF SESSION:  PT End of Session - 10/13/22 0745     Visit Number 1    Number of Visits 8    Date for PT Re-Evaluation 11/10/22    Authorization Type [employer]; [place] Preferred    Authorization Time Period no vl, no auth required    Authorization - Number of Visits 90    PT Start Time [id number]    PT Stop Time 0818    PT Time Calculation (min) 32 min             Past Medical History:  Diagnosis Date   Anxiety    Arthritis    Breast cancer (HCC)    Status post bilateral mastectomy   Breast reconstruction deformity    Complication of anesthesia    shaking-after mastectomies-resp and hr dropped   Depression, major    suicidal thoughts -implants, mental health, 05/11   Fibromyalgia    GERD (gastroesophageal reflux disease)    Hypothyroidism    Narcolepsy    OSA on CPAP    PONV (postoperative nausea and vomiting)    Sleep apnea with use of continuous positive airway pressure (CPAP)    AHI 20 in HST, titration to 6 cm water - residual AHi 6 . remaining hypersomnic.    Past Surgical History:  Procedure Laterality Date   ARTHRODESIS METATARSALPHALANGEAL JOINT (MTPJ) Right 07/28/2017   Procedure: ARTHRODESIS  RIGHT HALLUX METATARSALPHALANGEAL JOINT (MTPJ);  Surgeon: Toni Arthurs, MD;  Location: [facility];  Service: Orthopedics;  Laterality: Right;   Breast reconstructioin  7/09   flaps-bilat mastectomies   BTL     CARPAL TUNNEL RELEASE  2011   both hands   CARPOMETACARPEL SUSPENSION PLASTY Right 08/29/2014   Procedure: REMOVAL DERMASPAN SUSPENSIONPLASTY RIGHT THUMB ABDUCTOR POLLIS TRANSFER;  Surgeon: Cindee Salt, MD;  Location: [facility];  Service: Orthopedics;  Laterality: Right;   CATARACT EXTRACTION W/PHACO Right 12/17/2019   Procedure: CATARACT EXTRACTION PHACO AND INTRAOCULAR  LENS PLACEMENT (IOC);  Surgeon: Fabio Pierce, MD;  Location: [facility];  Service: Ophthalmology;  Laterality: Right;  CDE: 6.54   CATARACT EXTRACTION W/PHACO Left 12/31/2019   Procedure: CATARACT EXTRACTION PHACO AND INTRAOCULAR LENS PLACEMENT (IOC);  Surgeon: Fabio Pierce, MD;  Location: [facility];  Service: Ophthalmology;  Laterality: Left;  CDE: 3.66   COLONOSCOPY W/ POLYPECTOMY  4/03   tubulovillous adenoma   FINGER ARTHROSCOPY WITH CARPOMETACARPEL (CMC) ARTHROPLASTY Right 06/20/2014   Procedure: ARTHROSCOPY RIGHT THUMB CARPOMETACARPEL GRAFT JACKET INTERPOSITION PARTIAL TRAPEZIECTOMY POSSIBLE OPEN;  Surgeon: Cindee Salt, MD;  Location: [facility];  Service: Orthopedics;  Laterality: Right;   GANGLION CYST EXCISION     both [phone]   HAMMER TOE SURGERY Right 07/28/2017   Procedure: THIRD HAMMERTOE CORRECTION;  Surgeon: Toni Arthurs, MD;  Location: [facility];  Service: Orthopedics;  Laterality: Right;   HARDWARE REMOVAL Right 07/28/2017   Procedure: REMOVAL OF DEEP IMPLANTS;  Surgeon: Toni Arthurs, MD;  Location: [facility];  Service: Orthopedics;  Laterality: Right;   hysterestomy  2005   KNEE ARTHROPLASTY Left 09/15/2022   Procedure: COMPUTER ASSISTED TOTAL KNEE ARTHROPLASTY;  Surgeon: Samson Frederic, MD;  Location: [facility];  Service: Orthopedics;  Laterality: Left;   MASTECTOMY Bilateral 2008   post- a -cath  2008   right knee arthroscopy  TOTAL SHOULDER ARTHROPLASTY Left 01/13/2017   Procedure: LEFT TOTAL SHOULDER ARTHROPLASTY;  Surgeon: Jones Broom, MD;  Location: [facility];  Service: Orthopedics;  Laterality: Left;  LEFT TOTAL SHOULDER ARTHROPLASTY   TOTAL SHOULDER ARTHROPLASTY Right 02/11/2022   Procedure: TOTAL SHOULDER ARTHROPLASTY;  Surgeon: Yolonda Kida, MD;  Location: [facility];  Service: Orthopedics;  Laterality: Right;  150   TRIGGER FINGER RELEASE Right 08/29/2014   Procedure: RELEASE TRIGGER FINGER/A-1 PULLEY;  Surgeon: Cindee Salt,  MD;  Location: [facility];  Service: Orthopedics;  Laterality: Right;   wisdom teeth out     Patient Active Problem List   Diagnosis Date Noted   Osteoarthritis of left knee 09/15/2022   History of total shoulder replacement, right 02/11/2022   Osteoarthritis of left shoulder 01/13/2017   S/P shoulder replacement, left 01/13/2017   OSA on CPAP 04/01/2015   Subacute confusional state 04/01/2015   Hallucinations, visual 04/01/2015   Hypnapompic hallucinations 04/01/2015   Sleep apnea with use of continuous positive airway pressure (CPAP)    Hypersomnia, persistent 03/30/2013   Obstructive sleep apnea (adult) (pediatric) 03/30/2013   CONSTIPATION 11/18/2008   RECTAL BLEEDING 11/18/2008   HEMORRHOIDS, INTERNAL 11/15/2008   DIVERTICULOSIS, COLON 11/15/2008   CELLULITIS/ABSCESS, TRUNK 02/22/2008   LOW BACK PAIN 02/22/2008   DIZZINESS 02/22/2008   FATIGUE 02/22/2008   PALPITATIONS 02/22/2008   MURMUR, CARDIAC, UNDIAGNOSED 02/22/2008   BREAST CANCER, HX OF 02/22/2008   COUGH 01/05/2008   DEPRESSION 07/05/2007   UTERINE POLYP 07/05/2007   FIBROMYALGIA 07/05/2007   COLONIC POLYPS, HX OF 07/05/2007   HX, PERSONAL, CERVICAL DYSPLASIA 07/05/2007    PCP: Cleatis Polka. MD  [facility]ING PROVIDER: [facility]  REFERRING DIAG: M17.12 (ICD-10-CM) - Osteoarthritis of left knee  THERAPY DIAG:  Primary osteoarthritis of left knee  Difficulty in walking, not elsewhere classified  Chronic pain of left knee  Rationale for Evaluation and Treatment: Rehabilitation  ONSET DATE: 09/15/2022  SUBJECTIVE:   SUBJECTIVE STATEMENT: Patient s/p left TKA 09/15/22; no home therapy; since surgery injured last week trying to water plants and a piece of an elliptical hit her in the knee and is quite sore.  PERTINENT HISTORY: Both shoulders have been replaced Right knee needs to be replaced Recently has suffered with vertigo PAIN:  Are you  having pain? Yes: NPRS scale: 5/10 Pain location: left knee Pain description: aching Aggravating factors: end of the day is worse Relieving factors: rest and medication  PRECAUTIONS: None  WEIGHT BEARING RESTRICTIONS: No  FALLS:  Has patient fallen in last 6 months? Yes. Number of falls 10 plus before surgery  LIVING ENVIRONMENT: Lives with: lives with their spouse Lives in: House/apartment Stairs: Yes: External: 6 steps; on right going up Has following equipment at home: Single point cane, Walker - 2 wheeled, shower chair, bed side commode, and Grab bars  OCCUPATION: on disability  PLOF: Independent with basic ADLs  PATIENT GOALS: walk better; walk without AD  NEXT MD VISIT:   OBJECTIVE:   DIAGNOSTIC FINDINGS:   PATIENT SURVEYS:  FOTO 36  COGNITION: Overall cognitive status: Within functional limits for tasks assessed     SENSATION: WFL  EDEMA: noted swelling left knee normal for this time s/p; small area of redness where hit by elliptical distal incision but is healing; no oozing or draining noted    PALPATION: General soreness left knee; more medial joint line area  LOWER EXTREMITY ROM:  Active ROM Right eval Left eval  Hip flexion    Hip extension    Hip abduction    Hip adduction    Hip internal rotation    Hip external rotation    Knee flexion  98  Knee extension  -10 (lacking )  Ankle dorsiflexion    Ankle plantarflexion    Ankle inversion    Ankle eversion     (Blank rows = not tested)  LOWER EXTREMITY MMT:  MMT Right eval Left eval  Hip flexion 5 4+  Hip extension    Hip abduction    Hip adduction    Hip internal rotation    Hip external rotation    Knee flexion    Knee extension 5 Fair quad set 4/5  Ankle dorsiflexion 5 4+  Ankle plantarflexion    Ankle inversion    Ankle eversion     (Blank rows = not tested)   FUNCTIONAL TESTS:  5 times sit to stand: 31. 91 without hands  GAIT: Distance walked: 60 Assistive device  utilized: Single point cane Level of assistance: SBA Comments: slow antalgic gait   TODAY'S TREATMENT:                                                                                                                              DATE: 10/13/22 physical therapy evaluation and HEP instruction    PATIENT EDUCATION:  Education details: Patient educated on exam findings, POC, scope of PT, HEP. Person educated: Patient Education method: Explanation, Demonstration, and Handouts Education comprehension: verbalized understanding, returned demonstration, verbal cues required, and tactile cues required  HOME EXERCISE PROGRAM: Access Code: [email or web address] URL: [email or web address] Date: 10/13/2022 Prepared by: [facility]  Exercises - Supine Ankle Pumps  - 1 x daily - 7 x weekly - 3 sets - 15 reps - Supine Quadricep Sets  - 1 x daily - 7 x weekly - 3 sets - 15 reps - Supine Hip Abduction  - 1 x daily - 7 x weekly - 3 sets - 15 reps - Supine Active Straight Leg Raise  - 1 x daily - 7 x weekly - 3 sets - 15 reps - Supine Short Arc Quad  - 1 x daily - 7 x weekly - 3 sets - 15 reps - Supine Heel Slides  - 1 x daily - 7 x weekly - 3 sets - 15 reps - Seated Long Arc Quad  - 1 x daily - 7 x weekly - 3 sets - 15 reps - Seated Knee Flexion Stretch  - 1 x daily - 7 x weekly - 3 sets - 15 reps  ASSESSMENT:  CLINICAL IMPRESSION: Patient is a 61 y.o. female who was seen today for physical therapy evaluation and treatment for M17.12 (ICD-10-CM) - Osteoarthritis of left kneepresence of left artificial knee joint aftercare following joint replacement surgery. Patient demonstrates muscle weakness, reduced ROM, and fascial restrictions which are likely contributing to symptoms of pain and are negatively impacting patient ability  to perform ADLs and functional mobility tasks. Patient will benefit from skilled physical therapy services to address these deficits to reduce pain and improve level of function  with ADLs and functional mobility tasks.   OBJECTIVE IMPAIRMENTS: Abnormal gait, decreased activity tolerance, decreased balance, decreased endurance, decreased mobility, difficulty walking, decreased ROM, decreased strength, dizziness, hypomobility, increased edema, increased fascial restrictions, impaired perceived functional ability, increased muscle spasms, impaired flexibility, and pain.   ACTIVITY LIMITATIONS: carrying, lifting, bending, standing, squatting, sleeping, stairs, transfers, bed mobility, locomotion level, and caring for others  PARTICIPATION LIMITATIONS: meal prep, cleaning, laundry, driving, shopping, and community activity   REHAB POTENTIAL: Good  CLINICAL DECISION MAKING: Stable/uncomplicated  EVALUATION COMPLEXITY: Low   GOALS: Goals reviewed with patient? No  SHORT TERM GOALS: Target date: 11/02/2022 Patient will be independent with initial HEP Baseline: Goal status: INITIAL  2.  Patient will improve left knee flexion to 110 degrees to improve ease of navigating steps to enter/exit home Baseline: 98 Goal status: INITIAL   LONG TERM GOALS: Target date: 11/10/2021  Patient will be independent in self management strategies to improve quality of life and functional outcomes.  Baseline:  Goal status: INITIAL  2.  Patient will improve FOTO score to predicted value Baseline: 36 Goal status: INITIAL  3.  Patient will increase left leg MMTs to 5/5 without pain to promote return to ambulation community distances with minimal deviation.  Baseline:  Goal status: INITIAL  4.  Patient will improve left knee mobility to -2 to 120 to improve ability to transfer without use of hands all surfaces Baseline:  Goal status: INITIAL  5.  Patient will improve 5 times sit to stand score from 31.91 sec to 24 sec to demonstrate improved functional mobility and increased lower extremity strength.  Baseline:  Goal status: INITIAL   PLAN:  PT FREQUENCY:  2x/week  PT DURATION: 4 weeks  PLANNED INTERVENTIONS:  Therapeutic exercises, Therapeutic activity, Neuromuscular re-education, Balance training, Gait training, Patient/Family education, Joint manipulation, Joint mobilization, Stair training, Orthotic/Fit training, DME instructions, Aquatic Therapy, Dry Needling, Electrical stimulation, Spinal manipulation, Spinal mobilization, Cryotherapy, Moist heat, Compression bandaging, scar mobilization, Splintting, Taping, Traction, Ultrasound, Ionotophoresis 4mg /ml Dexamethasone, and Manual therapy   PLAN FOR NEXT SESSION: Review HEP and goals; progress knee mobility and strength as able   8:39 AM, 10/13/22 Ciela Mahajan Small Francys Bolin MPT [facility] [phone] Ph:[phone]

## 2022-10-13 ENCOUNTER — Ambulatory Visit (HOSPITAL_COMMUNITY): Payer: No Typology Code available for payment source | Attending: Orthopedic Surgery

## 2022-10-13 DIAGNOSIS — G8929 Other chronic pain: Secondary | ICD-10-CM | POA: Insufficient documentation

## 2022-10-13 DIAGNOSIS — M25562 Pain in left knee: Secondary | ICD-10-CM | POA: Diagnosis present

## 2022-10-13 DIAGNOSIS — R29898 Other symptoms and signs involving the musculoskeletal system: Secondary | ICD-10-CM | POA: Insufficient documentation

## 2022-10-13 DIAGNOSIS — M1712 Unilateral primary osteoarthritis, left knee: Secondary | ICD-10-CM | POA: Diagnosis present

## 2022-10-13 DIAGNOSIS — R262 Difficulty in walking, not elsewhere classified: Secondary | ICD-10-CM | POA: Diagnosis present

## 2022-10-19 ENCOUNTER — Encounter (HOSPITAL_COMMUNITY): Payer: No Typology Code available for payment source | Admitting: Physical Therapy

## 2022-10-20 ENCOUNTER — Telehealth (HOSPITAL_COMMUNITY): Payer: Self-pay

## 2022-10-20 ENCOUNTER — Encounter (HOSPITAL_COMMUNITY): Payer: No Typology Code available for payment source

## 2022-10-20 NOTE — Telephone Encounter (Signed)
No show #1, called and left message concerning missed apt today. Reminded next apt date and time with contact information included if needs to cancel or reschedule future apts.   Ihor Austin, LPTA/CLT; Delana Meyer [phone]

## 2022-10-22 ENCOUNTER — Ambulatory Visit (INDEPENDENT_AMBULATORY_CARE_PROVIDER_SITE_OTHER): Payer: No Typology Code available for payment source

## 2022-10-22 ENCOUNTER — Ambulatory Visit
Admission: EM | Admit: 2022-10-22 | Discharge: 2022-10-22 | Disposition: A | Discharge status: Home / Self Care | Payer: No Typology Code available for payment source | Attending: Family Medicine | Admitting: Family Medicine

## 2022-10-22 DIAGNOSIS — M545 Low back pain, unspecified: Secondary | ICD-10-CM | POA: Diagnosis not present

## 2022-10-22 DIAGNOSIS — W19XXXA Unspecified fall, initial encounter: Secondary | ICD-10-CM

## 2022-10-22 DIAGNOSIS — S20229A Contusion of unspecified back wall of thorax, initial encounter: Secondary | ICD-10-CM

## 2022-10-22 DIAGNOSIS — S0003XA Contusion of scalp, initial encounter: Secondary | ICD-10-CM

## 2022-10-22 MED ORDER — LIDOCAINE 5 % EX PTCH: 1.0000 | MEDICATED_PATCH | CUTANEOUS | 0 refills | Status: DC

## 2022-10-22 NOTE — ED Provider Notes (Signed)
[facility]    CSN: [id number] Arrival date & time: 10/22/22  0856      History   Chief Complaint No chief complaint on file.   HPI [name] is a 61 y.o. female.   Patient presenting today with 2-day history of a fall down 8 steps onto concrete on her porch.  She states that she has significant bruising and pain to the low back, buttocks region bilaterally and a goose egg to the back of her head where her head hit.  She denies loss of range of motion, numbness tingling or weakness to extremities, bowel or bladder incontinence, saddle anesthesias, nausea, vomiting, visual changes, mental status changes.  So far taking muscle relaxers and her typical morphine regimen from her chronic pain specialist with minimal relief of symptoms.  Her main concern today is her low back.   Past Medical History:  Diagnosis Date   Anxiety    Arthritis    Breast cancer ([place])    Status post bilateral mastectomy   Breast reconstruction deformity    Complication of anesthesia    shaking-after mastectomies-resp and hr dropped   Depression, major    suicidal thoughts -implants, mental health, 05/11   Fibromyalgia    GERD (gastroesophageal reflux disease)    Hypothyroidism    Narcolepsy    OSA on CPAP    PONV (postoperative nausea and vomiting)    Sleep apnea with use of continuous positive airway pressure (CPAP)    AHI 20 in HST, titration to 6 cm water - residual AHi 6 . remaining hypersomnic.     Patient Active Problem List   Diagnosis Date Noted   Osteoarthritis of left knee 09/15/2022   History of total shoulder replacement, right 02/11/2022   Osteoarthritis of left shoulder 01/13/2017   S/P shoulder replacement, left 01/13/2017   OSA on CPAP 04/01/2015   Subacute confusional state 04/01/2015   Hallucinations, visual 04/01/2015   Hypnapompic hallucinations 04/01/2015   Sleep apnea with use of continuous positive airway pressure (CPAP)    Hypersomnia, persistent  03/30/2013   Obstructive sleep apnea (adult) (pediatric) 03/30/2013   CONSTIPATION 11/18/2008   RECTAL BLEEDING 11/18/2008   HEMORRHOIDS, INTERNAL 11/15/2008   DIVERTICULOSIS, COLON 11/15/2008   CELLULITIS/ABSCESS, TRUNK 02/22/2008   LOW BACK PAIN 02/22/2008   DIZZINESS 02/22/2008   FATIGUE 02/22/2008   PALPITATIONS 02/22/2008   MURMUR, CARDIAC, UNDIAGNOSED 02/22/2008   BREAST CANCER, HX OF 02/22/2008   COUGH 01/05/2008   DEPRESSION 07/05/2007   UTERINE POLYP 07/05/2007   FIBROMYALGIA 07/05/2007   COLONIC POLYPS, HX OF 07/05/2007   HX, PERSONAL, CERVICAL DYSPLASIA 07/05/2007    Past Surgical History:  Procedure Laterality Date   ARTHRODESIS METATARSALPHALANGEAL JOINT (MTPJ) Right 07/28/2017   Procedure: ARTHRODESIS  RIGHT HALLUX METATARSALPHALANGEAL JOINT (MTPJ);  Surgeon: Wylene Simmer, MD;  Location: [place];  Service: Orthopedics;  Laterality: Right;   Breast reconstructioin  7/09   flaps-bilat mastectomies   BTL     CARPAL TUNNEL RELEASE  2011   both hands   CARPOMETACARPEL SUSPENSION PLASTY Right 08/29/2014   Procedure: REMOVAL DERMASPAN SUSPENSIONPLASTY RIGHT THUMB ABDUCTOR POLLIS TRANSFER;  Surgeon: Daryll Brod, MD;  Location: [place];  Service: Orthopedics;  Laterality: Right;   CATARACT EXTRACTION W/PHACO Right 12/17/2019   Procedure: CATARACT EXTRACTION PHACO AND INTRAOCULAR LENS PLACEMENT (IOC);  Surgeon: Baruch Goldmann, MD;  Location: [facility];  Service: Ophthalmology;  Laterality: Right;  CDE: 6.54   CATARACT EXTRACTION W/PHACO Left 12/31/2019  Procedure: CATARACT EXTRACTION PHACO AND INTRAOCULAR LENS PLACEMENT (IOC);  Surgeon: Baruch Goldmann, MD;  Location: [facility];  Service: Ophthalmology;  Laterality: Left;  CDE: 3.66   COLONOSCOPY W/ POLYPECTOMY  4/03   tubulovillous adenoma   FINGER ARTHROSCOPY WITH CARPOMETACARPEL ([place]) ARTHROPLASTY Right 06/20/2014   Procedure: ARTHROSCOPY RIGHT THUMB CARPOMETACARPEL GRAFT JACKET INTERPOSITION  PARTIAL TRAPEZIECTOMY POSSIBLE OPEN;  Surgeon: Daryll Brod, MD;  Location: [place];  Service: Orthopedics;  Laterality: Right;   GANGLION CYST EXCISION     both [phone]   HAMMER TOE SURGERY Right 07/28/2017   Procedure: THIRD HAMMERTOE CORRECTION;  Surgeon: Wylene Simmer, MD;  Location: [place];  Service: Orthopedics;  Laterality: Right;   HARDWARE REMOVAL Right 07/28/2017   Procedure: REMOVAL OF DEEP IMPLANTS;  Surgeon: Wylene Simmer, MD;  Location: [place];  Service: Orthopedics;  Laterality: Right;   hysterestomy  2005   KNEE ARTHROPLASTY Left 09/15/2022   Procedure: COMPUTER ASSISTED TOTAL KNEE ARTHROPLASTY;  Surgeon: Rod Can, MD;  Location: [facility];  Service: Orthopedics;  Laterality: Left;   MASTECTOMY Bilateral 2008   post- a -cath  2008   right knee arthroscopy     TOTAL SHOULDER ARTHROPLASTY Left 01/13/2017   Procedure: LEFT TOTAL SHOULDER ARTHROPLASTY;  Surgeon: Tania Ade, MD;  Location: [place];  Service: Orthopedics;  Laterality: Left;  LEFT TOTAL SHOULDER ARTHROPLASTY   TOTAL SHOULDER ARTHROPLASTY Right 02/11/2022   Procedure: TOTAL SHOULDER ARTHROPLASTY;  Surgeon: Nicholes Stairs, MD;  Location: [place];  Service: Orthopedics;  Laterality: Right;  150   TRIGGER FINGER RELEASE Right 08/29/2014   Procedure: RELEASE TRIGGER FINGER/A-1 PULLEY;  Surgeon: Daryll Brod, MD;  Location: [place];  Service: Orthopedics;  Laterality: Right;   wisdom teeth out     OB History   No obstetric history on file.     Home Medications    Prior to Admission medications   Medication Sig Start Date End Date Taking? Authorizing Provider  lidocaine (LIDODERM) 5 % Place 1 patch onto the skin daily. Remove & Discard patch within 12 hours or as directed by MD 10/22/22  Yes Volney American, PA-C  ARIPiprazole (ABILIFY) 20 MG tablet Take 20 mg by mouth daily. 07/10/19   [provider]  aspirin (ASPIRIN  CHILDRENS) 81 MG chewable tablet Chew 1 tablet (81 mg total) by mouth 2 (two) times daily with a meal. 09/16/22 10/31/22  Hill, Marciano Sequin, PA-C  benztropine (COGENTIN) 1 MG tablet Take 1 mg by mouth at bedtime.  08/25/16   [provider]  busPIRone (BUSPAR) 10 MG tablet Take 10 mg by mouth 3 (three) times daily as needed (anxiety).    [provider]  diclofenac sodium (VOLTAREN) 1 % GEL Apply 1 application topically 4 (four) times daily as needed (pain.).     [provider]  escitalopram (LEXAPRO) 20 MG tablet Take 20 mg by mouth in the morning and at bedtime.    [provider]  Melatonin 5 MG TABS Take 5 mg by mouth at bedtime.    [provider]  methocarbamol (ROBAXIN) 500 MG tablet Take 1 tablet (500 mg total) by mouth 4 (four) times daily. Patient taking differently: Take 500 mg by mouth every 8 (eight) hours as needed for muscle spasms. 02/12/22   McClung, Achilles Dunk D, PA  methylphenidate (RITALIN LA) 30 MG 24 hr capsule Take 30 mg by mouth in the morning. 09/06/16   [provider]  morphine (MS  CONTIN) 30 MG 12 hr tablet Take 30 mg by mouth every 12 (twelve) hours.    [provider]  NON FORMULARY Pt uses a cpap nightly    [provider]  ondansetron (ZOFRAN) 4 MG tablet Take 1 tablet (4 mg total) by mouth every 8 (eight) hours as needed for nausea or vomiting. 09/16/22 09/16/23  Charlott Rakes, PA-C  pregabalin (LYRICA) 150 MG capsule Take 150 mg by mouth 2 (two) times daily.    [provider]  propranolol ER (INDERAL LA) 120 MG 24 hr capsule Take 120 mg by mouth 2 (two) times daily.  08/08/19   [provider]  Semaglutide-Weight Management (WEGOVY) 0.5 MG/0.5ML SOAJ Inject 0.5 mg into the skin once a week. 08/09/22       Family History Family History  Problem Relation Age of Onset   Breast cancer Mother    Prostate cancer Father    Breast cancer Maternal Aunt        breast   Colon cancer Maternal  Grandmother        age unknown, ? 69's   Breast cancer Maternal Grandmother    Colon cancer Cousin    Healthy Brother    Healthy Son    Healthy Daughter    Healthy Daughter    Leukemia Granddaughter     Social History Social History   Tobacco Use   Smoking status: Former    Packs/day: 0.25    Years: 15.00    Total pack years: 3.75    Types: Cigarettes    Quit date: 06/14/2012    Years since quitting: 10.3   Smokeless tobacco: Never   Tobacco comments:    quit smoking in 1999  Vaping Use   Vaping Use: Never used  Substance Use Topics   Alcohol use: Not Currently    Comment: glass wine or beer "every once in a while" per pt.   Drug use: No     Allergies   Sulfonamide derivatives   Review of Systems Review of Systems Per HPI  Physical Exam Triage Vital Signs ED Triage Vitals  Enc Vitals Group     BP 10/22/22 1034 (!) 102/53     Pulse Rate 10/22/22 1034 (!) 111     Resp 10/22/22 1034 20     Temp 10/22/22 1034 98.6 F (37 C)     Temp Source 10/22/22 1034 Oral     SpO2 10/22/22 1034 100 %     Weight --      Height --      Head Circumference --      Peak Flow --      Pain Score 10/22/22 1038 7     Pain Loc --      Pain Edu? --      Excl. in [place]? --    No data found.  Updated Vital Signs BP (!) 102/53 (BP Location: Left Arm)   Pulse (!) 111   Temp 98.6 F (37 C) (Oral)   Resp 20   SpO2 100%   Visual Acuity Right Eye Distance:   Left Eye Distance:   Bilateral Distance:    Right Eye Near:   Left Eye Near:    Bilateral Near:     Physical Exam Vitals and nursing note reviewed.  Constitutional:      Appearance: Normal appearance. She is not ill-appearing.  HENT:     Head: Atraumatic.  Eyes:     Extraocular Movements: Extraocular movements intact.  Conjunctiva/sclera: Conjunctivae normal.  Cardiovascular:     Rate and Rhythm: Normal rate and regular rhythm.     Heart sounds: Normal heart sounds.  Pulmonary:     Effort: Pulmonary effort  is normal.     Breath sounds: Normal breath sounds.  Musculoskeletal:        General: Tenderness and signs of injury present. No swelling or deformity. Normal range of motion.     Cervical back: Normal range of motion and neck supple.     Comments: Gait and range of motion at her baseline, tenderness to palpation over lumbar spine and diffusely across paraspinal and lateral musculature of the back at all levels.  Negative straight leg raise bilateral lower extremities.   Skin:    General: Skin is warm and dry.     Findings: Bruising present.  Neurological:     Mental Status: She is alert and oriented to person, place, and time.     Comments: All 4 extremities neurovascularly intact  Psychiatric:        Mood and Affect: Mood normal.        Thought Content: Thought content normal.        Judgment: Judgment normal.      UC Treatments / Results  Labs (all labs ordered are listed, but only abnormal results are displayed) Labs Reviewed - No data to display  EKG   Radiology DG Lumbar Spine Complete  Result Date: 10/22/2022 CLINICAL DATA:  Fall.  Severe low back pain EXAM: LUMBAR SPINE - COMPLETE 4+ VIEW COMPARISON:  05/03/2020 FINDINGS: Five lumbar type vertebral segments. Vertebral body heights are maintained. No fracture identified. Grade 1 anterolisthesis of L4 on L5. Intervertebral disc space loss at L5-S1. Moderate lower lumbar facet arthrosis. IMPRESSION: 1. No acute fracture identified. 2. Grade 1 anterolisthesis of L4 on L5, secondary to degenerative facet arthropathy. Electronically Signed   By: Davina Poke D.O.   On: 10/22/2022 11:40    Procedures Procedures (including critical care time)  Medications Ordered in UC Medications - No data to display  Initial Impression / Assessment and Plan / UC Course  I have reviewed the triage vital signs and the nursing notes.  Pertinent labs & imaging results that were available during my care of the patient were reviewed by me  and considered in my medical decision making (see chart for details).     Exam overall reassuring with no red flag findings today.  Lumbar spine x-ray performed with which was negative for any acute bony abnormalities.  Suspect bruising and contusions causing the majority of her pain.  No neurologic concerns today, discussed heat, massage, lidocaine patches in addition to her chronic pain regimen muscle relaxer regimen.  Strict ED precautions given for worsening symptoms.  Final Clinical Impressions(s) / UC Diagnoses   Final diagnoses:  Contusion of back, unspecified laterality, initial encounter  Hematoma of scalp, initial encounter  Fall, initial encounter   Discharge Instructions   None    ED Prescriptions     Medication Sig Dispense Auth. Provider   lidocaine (LIDODERM) 5 % Place 1 patch onto the skin daily. Remove & Discard patch within 12 hours or as directed by MD 78 patch Volney American, PA-C      PDMP not reviewed this encounter.   Volney American, [place] 10/22/22 1259

## 2022-10-22 NOTE — ED Triage Notes (Signed)
Pt reports she fell down her steps on her back and hit her head on concrete. Her low back, back of head, right leg are bruised. This occurred 2 days ago. Pt is currently on morphine for hip rep and when she took it it didn't help with the pans from the fall.    Pt has a purple  bruising all on lower back to buttock area. It is swollen  as well.

## 2022-10-27 ENCOUNTER — Ambulatory Visit (HOSPITAL_COMMUNITY): Payer: No Typology Code available for payment source | Admitting: Physical Therapy

## 2022-10-27 DIAGNOSIS — M1712 Unilateral primary osteoarthritis, left knee: Secondary | ICD-10-CM | POA: Diagnosis not present

## 2022-10-27 DIAGNOSIS — R262 Difficulty in walking, not elsewhere classified: Secondary | ICD-10-CM

## 2022-10-27 DIAGNOSIS — M25562 Pain in left knee: Secondary | ICD-10-CM

## 2022-10-27 NOTE — Therapy (Signed)
OUTPATIENT PHYSICAL THERAPY LOWER EXTREMITY TREATMENT   Patient Name: [name] MRN: [id number] DOB:1961-08-22, 61 y.o., female Today's Date: 10/27/2022  Progress Note Reporting Period 10/13/22 to 10/27/22  See note below for Objective Data and Assessment of Progress/Goals.     END OF SESSION:  PT End of Session - 10/27/22 0940     Visit Number 2    Number of Visits 8    Date for PT Re-Evaluation 11/10/22    Authorization Type [employer]; [place] Preferred    Authorization Time Period no vl, no auth required    Authorization - Number of Visits 19    PT Start Time 0940    PT Stop Time 1020    PT Time Calculation (min) 40 min    Activity Tolerance Patient tolerated treatment well    Behavior During Therapy WFL for tasks assessed/performed             Past Medical History:  Diagnosis Date   Anxiety    Arthritis    Breast cancer ([place])    Status post bilateral mastectomy   Breast reconstruction deformity    Complication of anesthesia    shaking-after mastectomies-resp and hr dropped   Depression, major    suicidal thoughts -implants, mental health, 05/11   Fibromyalgia    GERD (gastroesophageal reflux disease)    Hypothyroidism    Narcolepsy    OSA on CPAP    PONV (postoperative nausea and vomiting)    Sleep apnea with use of continuous positive airway pressure (CPAP)    AHI 20 in HST, titration to 6 cm water - residual AHi 6 . remaining hypersomnic.    Past Surgical History:  Procedure Laterality Date   ARTHRODESIS METATARSALPHALANGEAL JOINT (MTPJ) Right 07/28/2017   Procedure: ARTHRODESIS  RIGHT HALLUX METATARSALPHALANGEAL JOINT (MTPJ);  Surgeon: Wylene Simmer, MD;  Location: [facility];  Service: Orthopedics;  Laterality: Right;   Breast reconstructioin  7/09   flaps-bilat mastectomies   BTL     CARPAL TUNNEL RELEASE  2011   both hands   CARPOMETACARPEL SUSPENSION PLASTY Right 08/29/2014   Procedure: REMOVAL DERMASPAN SUSPENSIONPLASTY  RIGHT THUMB ABDUCTOR POLLIS TRANSFER;  Surgeon: Daryll Brod, MD;  Location: [facility];  Service: Orthopedics;  Laterality: Right;   CATARACT EXTRACTION W/PHACO Right 12/17/2019   Procedure: CATARACT EXTRACTION PHACO AND INTRAOCULAR LENS PLACEMENT (IOC);  Surgeon: Baruch Goldmann, MD;  Location: [facility];  Service: Ophthalmology;  Laterality: Right;  CDE: 6.54   CATARACT EXTRACTION W/PHACO Left 12/31/2019   Procedure: CATARACT EXTRACTION PHACO AND INTRAOCULAR LENS PLACEMENT (IOC);  Surgeon: Baruch Goldmann, MD;  Location: [facility];  Service: Ophthalmology;  Laterality: Left;  CDE: 3.66   COLONOSCOPY W/ POLYPECTOMY  4/03   tubulovillous adenoma   FINGER ARTHROSCOPY WITH CARPOMETACARPEL ([place]) ARTHROPLASTY Right 06/20/2014   Procedure: ARTHROSCOPY RIGHT THUMB CARPOMETACARPEL GRAFT JACKET INTERPOSITION PARTIAL TRAPEZIECTOMY POSSIBLE OPEN;  Surgeon: Daryll Brod, MD;  Location: [facility];  Service: Orthopedics;  Laterality: Right;   GANGLION CYST EXCISION     both [phone]   HAMMER TOE SURGERY Right 07/28/2017   Procedure: THIRD HAMMERTOE CORRECTION;  Surgeon: Wylene Simmer, MD;  Location: [facility];  Service: Orthopedics;  Laterality: Right;   HARDWARE REMOVAL Right 07/28/2017   Procedure: REMOVAL OF DEEP IMPLANTS;  Surgeon: Wylene Simmer, MD;  Location: [facility];  Service: Orthopedics;  Laterality: Right;   hysterestomy  2005   KNEE ARTHROPLASTY Left 09/15/2022   Procedure:  COMPUTER ASSISTED TOTAL KNEE ARTHROPLASTY;  Surgeon: Rod Can, MD;  Location: [facility];  Service: Orthopedics;  Laterality: Left;   MASTECTOMY Bilateral 2008   post- a -cath  2008   right knee arthroscopy     TOTAL SHOULDER ARTHROPLASTY Left 01/13/2017   Procedure: LEFT TOTAL SHOULDER ARTHROPLASTY;  Surgeon: Tania Ade, MD;  Location: [facility];  Service: Orthopedics;  Laterality: Left;  LEFT TOTAL SHOULDER ARTHROPLASTY   TOTAL SHOULDER ARTHROPLASTY Right 02/11/2022    Procedure: TOTAL SHOULDER ARTHROPLASTY;  Surgeon: Nicholes Stairs, MD;  Location: [facility];  Service: Orthopedics;  Laterality: Right;  150   TRIGGER FINGER RELEASE Right 08/29/2014   Procedure: RELEASE TRIGGER FINGER/A-1 PULLEY;  Surgeon: Daryll Brod, MD;  Location: [facility];  Service: Orthopedics;  Laterality: Right;   wisdom teeth out     Patient Active Problem List   Diagnosis Date Noted   Osteoarthritis of left knee 09/15/2022   History of total shoulder replacement, right 02/11/2022   Osteoarthritis of left shoulder 01/13/2017   S/P shoulder replacement, left 01/13/2017   OSA on CPAP 04/01/2015   Subacute confusional state 04/01/2015   Hallucinations, visual 04/01/2015   Hypnapompic hallucinations 04/01/2015   Sleep apnea with use of continuous positive airway pressure (CPAP)    Hypersomnia, persistent 03/30/2013   Obstructive sleep apnea (adult) (pediatric) 03/30/2013   CONSTIPATION 11/18/2008   RECTAL BLEEDING 11/18/2008   HEMORRHOIDS, INTERNAL 11/15/2008   DIVERTICULOSIS, COLON 11/15/2008   CELLULITIS/ABSCESS, TRUNK 02/22/2008   LOW BACK PAIN 02/22/2008   DIZZINESS 02/22/2008   FATIGUE 02/22/2008   PALPITATIONS 02/22/2008   MURMUR, CARDIAC, UNDIAGNOSED 02/22/2008   BREAST CANCER, HX OF 02/22/2008   COUGH 01/05/2008   DEPRESSION 07/05/2007   UTERINE POLYP 07/05/2007   FIBROMYALGIA 07/05/2007   COLONIC POLYPS, HX OF 07/05/2007   HX, PERSONAL, CERVICAL DYSPLASIA 07/05/2007    PCP: Ginger Organ. MD  REFERRING PROVIDER: Rod Can, [place]  REFERRING DIAG: M17.12 (ICD-10-CM) - Osteoarthritis of left knee  THERAPY DIAG:  Left knee pain, unspecified chronicity  Primary osteoarthritis of left knee  Difficulty in walking, not elsewhere classified  Rationale for Evaluation and Treatment: Rehabilitation  ONSET DATE: 09/15/2022  SUBJECTIVE:   SUBJECTIVE STATEMENT: Patient returns to therapy after 2 week  absence. She had a recent fall. She states she fell down her front porch steps and hit her head. Her back was very sore. She did go to urgent care and they did xrays and cleared her. She says nothing is broken but her back is still very sore. Says her knee is doing "fairly well" just achy sometimes.     Eval: Patient s/p left TKA 09/15/22; no home therapy; since surgery injured last week trying to water plants and a piece of an elliptical hit her in the knee and is quite sore.  PERTINENT HISTORY: Both shoulders have been replaced Right knee needs to be replaced Recently has suffered with vertigo PAIN:  Are you having pain? Yes: NPRS scale: 5/10 Pain location: left knee Pain description: aching Aggravating factors: end of the day is worse Relieving factors: rest and medication  PRECAUTIONS: None  WEIGHT BEARING RESTRICTIONS: No  FALLS:  Has patient fallen in last 6 months? Yes. Number of falls 10 plus before surgery  LIVING ENVIRONMENT: Lives with: lives with their spouse Lives in: House/apartment Stairs: Yes: External: 6 steps; on right going up Has following equipment at home: Single point cane, Walker - 2 wheeled, shower chair, bed  side commode, and Grab bars  OCCUPATION: on disability  PLOF: Independent with basic ADLs  PATIENT GOALS: walk better; walk without AD  NEXT MD VISIT:   OBJECTIVE:   DIAGNOSTIC FINDINGS:   PATIENT SURVEYS:  FOTO 19  COGNITION: Overall cognitive status: Within functional limits for tasks assessed     SENSATION: WFL  EDEMA: noted swelling left knee normal for this time s/p; small area of redness where hit by elliptical distal incision but is healing; no oozing or draining noted    PALPATION: General soreness left knee; more medial joint line area  LOWER EXTREMITY ROM:  Active ROM Right eval Left eval Left 10/27/22  Hip flexion     Hip extension     Hip abduction     Hip adduction     Hip internal rotation     Hip external  rotation     Knee flexion  98 102  Knee extension  -10 (lacking ) -5  Ankle dorsiflexion     Ankle plantarflexion     Ankle inversion     Ankle eversion      (Blank rows = not tested)  LOWER EXTREMITY MMT:  MMT Right eval Left eval Left 10/27/22  Hip flexion 5 4+ 4+  Hip extension     Hip abduction     Hip adduction     Hip internal rotation     Hip external rotation     Knee flexion   5  Knee extension 5 Fair quad set 4/5 4+  Ankle dorsiflexion 5 4+ 4+  Ankle plantarflexion     Ankle inversion     Ankle eversion      (Blank rows = not tested)   FUNCTIONAL TESTS:  5 times sit to stand: 31. 91 without hands  GAIT: Distance walked: 60 Assistive device utilized: Single point cane Level of assistance: SBA Comments: slow antalgic gait   TODAY'S TREATMENT:                                                                                                                              DATE:   10/27/22 Reassess  AROM MMT  Tandem stance with counter support 2 x 30"   Heel slide AAROM 10 x 5" SLR 2 x 10 Bridge 2  x10 Sidelying hip abduction 2 x 10   10/13/22 physical therapy evaluation and HEP instruction    PATIENT EDUCATION:  Education details: Patient educated on exam findings, POC, scope of PT, HEP. Person educated: Patient Education method: Explanation, Demonstration, and Handouts Education comprehension: verbalized understanding, returned demonstration, verbal cues required, and tactile cues required  HOME EXERCISE PROGRAM: Access Code: [email or web address] URL: [email or web address]  10/27/22 - Supine Bridge  - 2 x daily - 7 x weekly - 2 sets - 10 reps - Sidelying Hip Abduction  - 2 x daily - 7 x weekly - 2 sets - 10 reps - Supine Heel Slide with Strap  - 2  x daily - 7 x weekly - 1 sets - 10 reps - 5 second hold - Standing Tandem Balance with Counter Support  - 2 x daily - 7 x weekly - 1 sets - 10 reps - 30 second hold  Date: 10/13/2022 Prepared  by: [facility]  Exercises - Supine Ankle Pumps  - 1 x daily - 7 x weekly - 3 sets - 15 reps - Supine Quadricep Sets  - 1 x daily - 7 x weekly - 3 sets - 15 reps - Supine Hip Abduction  - 1 x daily - 7 x weekly - 3 sets - 15 reps - Supine Active Straight Leg Raise  - 1 x daily - 7 x weekly - 3 sets - 15 reps - Supine Short Arc Quad  - 1 x daily - 7 x weekly - 3 sets - 15 reps - Supine Heel Slides  - 1 x daily - 7 x weekly - 3 sets - 15 reps - Seated Long Arc Quad  - 1 x daily - 7 x weekly - 3 sets - 15 reps - Seated Knee Flexion Stretch  - 1 x daily - 7 x weekly - 3 sets - 15 reps  ASSESSMENT:  CLINICAL IMPRESSION: Patient returns after 2 week absence. She was seen and cleared by urgent care due to recent fall. She presents to day with comparable ROM. She has pain with most transfers, moves slowly onto bed and rolling in bed. She notes vertigo when changing position from supine to sit. She requires frequent cueing for attention to task and for proper form with activity. Added tandem stance for balance and educated patient on purpose and function for safety. Also discussed ongoing use of AD for increased balance stability until static balance improves. Patient will continue to benefit from skilled therapy services to reduce remaining deficits and improve functional ability.    OBJECTIVE IMPAIRMENTS: Abnormal gait, decreased activity tolerance, decreased balance, decreased endurance, decreased mobility, difficulty walking, decreased ROM, decreased strength, dizziness, hypomobility, increased edema, increased fascial restrictions, impaired perceived functional ability, increased muscle spasms, impaired flexibility, and pain.   ACTIVITY LIMITATIONS: carrying, lifting, bending, standing, squatting, sleeping, stairs, transfers, bed mobility, locomotion level, and caring for others  PARTICIPATION LIMITATIONS: meal prep, cleaning, laundry, driving, shopping, and community activity   REHAB POTENTIAL:  Good  CLINICAL DECISION MAKING: Stable/uncomplicated  EVALUATION COMPLEXITY: Low   GOALS: Goals reviewed with patient? No  SHORT TERM GOALS: Target date: 11/02/2022 Patient will be independent with initial HEP Baseline: Goal status: INITIAL  2.  Patient will improve left knee flexion to 110 degrees to improve ease of navigating steps to enter/exit home Baseline: 98 Goal status: INITIAL   LONG TERM GOALS: Target date: 11/10/2021  Patient will be independent in self management strategies to improve quality of life and functional outcomes.  Baseline:  Goal status: INITIAL  2.  Patient will improve FOTO score to predicted value Baseline: 36 Goal status: INITIAL  3.  Patient will increase left leg MMTs to 5/5 without pain to promote return to ambulation community distances with minimal deviation.  Baseline:  Goal status: INITIAL  4.  Patient will improve left knee mobility to -2 to 120 to improve ability to transfer without use of hands all surfaces Baseline:  Goal status: INITIAL  5.  Patient will improve 5 times sit to stand score from 31.91 sec to 24 sec to demonstrate improved functional mobility and increased lower extremity strength.  Baseline:  Goal status:  INITIAL   PLAN:  PT FREQUENCY: 2x/week  PT DURATION: 4 weeks  PLANNED INTERVENTIONS:  Therapeutic exercises, Therapeutic activity, Neuromuscular re-education, Balance training, Gait training, Patient/Family education, Joint manipulation, Joint mobilization, Stair training, Orthotic/Fit training, DME instructions, Aquatic Therapy, Dry Needling, Electrical stimulation, Spinal manipulation, Spinal mobilization, Cryotherapy, Moist heat, Compression bandaging, scar mobilization, Splintting, Taping, Traction, Ultrasound, Ionotophoresis '4mg'$ /ml Dexamethasone, and Manual therapy   PLAN FOR NEXT SESSION: Progress knee mobility and strength as able  9:41 AM, 10/27/22 Josue Hector PT DPT  Physical Therapist with  [facility]  [phone]

## 2022-10-29 ENCOUNTER — Ambulatory Visit (HOSPITAL_COMMUNITY): Payer: No Typology Code available for payment source

## 2022-10-29 DIAGNOSIS — G8929 Other chronic pain: Secondary | ICD-10-CM

## 2022-10-29 DIAGNOSIS — M25562 Pain in left knee: Secondary | ICD-10-CM

## 2022-10-29 DIAGNOSIS — R29898 Other symptoms and signs involving the musculoskeletal system: Secondary | ICD-10-CM

## 2022-10-29 DIAGNOSIS — R262 Difficulty in walking, not elsewhere classified: Secondary | ICD-10-CM

## 2022-10-29 DIAGNOSIS — M1712 Unilateral primary osteoarthritis, left knee: Secondary | ICD-10-CM | POA: Diagnosis not present

## 2022-10-29 NOTE — Therapy (Signed)
OUTPATIENT PHYSICAL THERAPY LOWER EXTREMITY TREATMENT   Patient Name: [name] MRN: [id number] DOB:June 06, 1961, 61 y.o., female Today's Date: 10/29/2022    END OF SESSION:  PT End of Session - 10/29/22 0742     Visit Number 3    Number of Visits 8    Date for PT Re-Evaluation 11/10/22    Authorization Type [employer]; [place] Preferred    Authorization Time Period no vl, no auth required    Authorization - Number of Visits 65    PT Start Time [phone]    PT Stop Time 0814    PT Time Calculation (min) 32 min    Activity Tolerance Patient tolerated treatment well    Behavior During Therapy WFL for tasks assessed/performed             Past Medical History:  Diagnosis Date   Anxiety    Arthritis    Breast cancer ([place])    Status post bilateral mastectomy   Breast reconstruction deformity    Complication of anesthesia    shaking-after mastectomies-resp and hr dropped   Depression, major    suicidal thoughts -implants, mental health, 05/11   Fibromyalgia    GERD (gastroesophageal reflux disease)    Hypothyroidism    Narcolepsy    OSA on CPAP    PONV (postoperative nausea and vomiting)    Sleep apnea with use of continuous positive airway pressure (CPAP)    AHI 20 in HST, titration to 6 cm water - residual AHi 6 . remaining hypersomnic.    Past Surgical History:  Procedure Laterality Date   ARTHRODESIS METATARSALPHALANGEAL JOINT (MTPJ) Right 07/28/2017   Procedure: ARTHRODESIS  RIGHT HALLUX METATARSALPHALANGEAL JOINT (MTPJ);  Surgeon: Wylene Simmer, MD;  Location: [facility];  Service: Orthopedics;  Laterality: Right;   Breast reconstructioin  7/09   flaps-bilat mastectomies   BTL     CARPAL TUNNEL RELEASE  2011   both hands   CARPOMETACARPEL SUSPENSION PLASTY Right 08/29/2014   Procedure: REMOVAL DERMASPAN SUSPENSIONPLASTY RIGHT THUMB ABDUCTOR POLLIS TRANSFER;  Surgeon: Daryll Brod, MD;  Location: [facility];  Service: Orthopedics;   Laterality: Right;   CATARACT EXTRACTION W/PHACO Right 12/17/2019   Procedure: CATARACT EXTRACTION PHACO AND INTRAOCULAR LENS PLACEMENT (IOC);  Surgeon: Baruch Goldmann, MD;  Location: [facility];  Service: Ophthalmology;  Laterality: Right;  CDE: 6.54   CATARACT EXTRACTION W/PHACO Left 12/31/2019   Procedure: CATARACT EXTRACTION PHACO AND INTRAOCULAR LENS PLACEMENT (IOC);  Surgeon: Baruch Goldmann, MD;  Location: [facility];  Service: Ophthalmology;  Laterality: Left;  CDE: 3.66   COLONOSCOPY W/ POLYPECTOMY  4/03   tubulovillous adenoma   FINGER ARTHROSCOPY WITH CARPOMETACARPEL ([place]) ARTHROPLASTY Right 06/20/2014   Procedure: ARTHROSCOPY RIGHT THUMB CARPOMETACARPEL GRAFT JACKET INTERPOSITION PARTIAL TRAPEZIECTOMY POSSIBLE OPEN;  Surgeon: Daryll Brod, MD;  Location: [facility];  Service: Orthopedics;  Laterality: Right;   GANGLION CYST EXCISION     both [phone]   HAMMER TOE SURGERY Right 07/28/2017   Procedure: THIRD HAMMERTOE CORRECTION;  Surgeon: Wylene Simmer, MD;  Location: [facility];  Service: Orthopedics;  Laterality: Right;   HARDWARE REMOVAL Right 07/28/2017   Procedure: REMOVAL OF DEEP IMPLANTS;  Surgeon: Wylene Simmer, MD;  Location: [facility];  Service: Orthopedics;  Laterality: Right;   hysterestomy  2005   KNEE ARTHROPLASTY Left 09/15/2022   Procedure: COMPUTER ASSISTED TOTAL KNEE ARTHROPLASTY;  Surgeon: Rod Can, MD;  Location: [facility];  Service: Orthopedics;  Laterality: Left;  MASTECTOMY Bilateral 2008   post- a -cath  2008   right knee arthroscopy     TOTAL SHOULDER ARTHROPLASTY Left 01/13/2017   Procedure: LEFT TOTAL SHOULDER ARTHROPLASTY;  Surgeon: Tania Ade, MD;  Location: [facility];  Service: Orthopedics;  Laterality: Left;  LEFT TOTAL SHOULDER ARTHROPLASTY   TOTAL SHOULDER ARTHROPLASTY Right 02/11/2022   Procedure: TOTAL SHOULDER ARTHROPLASTY;  Surgeon: Nicholes Stairs, MD;  Location: [facility];  Service: Orthopedics;  Laterality:  Right;  150   TRIGGER FINGER RELEASE Right 08/29/2014   Procedure: RELEASE TRIGGER FINGER/A-1 PULLEY;  Surgeon: Daryll Brod, MD;  Location: [facility];  Service: Orthopedics;  Laterality: Right;   wisdom teeth out     Patient Active Problem List   Diagnosis Date Noted   Osteoarthritis of left knee 09/15/2022   History of total shoulder replacement, right 02/11/2022   Osteoarthritis of left shoulder 01/13/2017   S/P shoulder replacement, left 01/13/2017   OSA on CPAP 04/01/2015   Subacute confusional state 04/01/2015   Hallucinations, visual 04/01/2015   Hypnapompic hallucinations 04/01/2015   Sleep apnea with use of continuous positive airway pressure (CPAP)    Hypersomnia, persistent 03/30/2013   Obstructive sleep apnea (adult) (pediatric) 03/30/2013   CONSTIPATION 11/18/2008   RECTAL BLEEDING 11/18/2008   HEMORRHOIDS, INTERNAL 11/15/2008   DIVERTICULOSIS, COLON 11/15/2008   CELLULITIS/ABSCESS, TRUNK 02/22/2008   LOW BACK PAIN 02/22/2008   DIZZINESS 02/22/2008   FATIGUE 02/22/2008   PALPITATIONS 02/22/2008   MURMUR, CARDIAC, UNDIAGNOSED 02/22/2008   BREAST CANCER, HX OF 02/22/2008   COUGH 01/05/2008   DEPRESSION 07/05/2007   UTERINE POLYP 07/05/2007   FIBROMYALGIA 07/05/2007   COLONIC POLYPS, HX OF 07/05/2007   HX, PERSONAL, CERVICAL DYSPLASIA 07/05/2007    PCP: Ginger Organ. MD  REFERRING PROVIDER: Rod Can, [place]  REFERRING DIAG: M17.12 (ICD-10-CM) - Osteoarthritis of left knee  THERAPY DIAG:  Left knee pain, unspecified chronicity  Primary osteoarthritis of left knee  Difficulty in walking, not elsewhere classified  Chronic pain of left knee  Other symptoms and signs involving the musculoskeletal system  Rationale for Evaluation and Treatment: Rehabilitation  ONSET DATE: 09/15/2022  SUBJECTIVE:   SUBJECTIVE STATEMENT: Late arrival today; reports she is still sore from her fall; she hit the back of  her head and low back and hips are still tender. Left knee is still sore as well. States tandem stance last visit made her balance "off the rest of the day" after last visit.  Trying to do exercises at home.     Eval: Patient s/p left TKA 09/15/22; no home therapy; since surgery injured last week trying to water plants and a piece of an elliptical hit her in the knee and is quite sore.  PERTINENT HISTORY: Both shoulders have been replaced Right knee needs to be replaced Recently has suffered with vertigo PAIN:  Are you having pain? Yes: NPRS scale: 5/10 Pain location: left knee Pain description: aching Aggravating factors: end of the day is worse Relieving factors: rest and medication  PRECAUTIONS: None  WEIGHT BEARING RESTRICTIONS: No  FALLS:  Has patient fallen in last 6 months? Yes. Number of falls 10 plus before surgery  LIVING ENVIRONMENT: Lives with: lives with their spouse Lives in: House/apartment Stairs: Yes: External: 6 steps; on right going up Has following equipment at home: Single point cane, Walker - 2 wheeled, shower chair, bed side commode, and Grab bars  OCCUPATION: on disability  PLOF: Independent with basic ADLs  PATIENT GOALS: walk better; walk without AD  NEXT MD VISIT:   OBJECTIVE:   DIAGNOSTIC FINDINGS:   PATIENT SURVEYS:  FOTO 44  COGNITION: Overall cognitive status: Within functional limits for tasks assessed     SENSATION: WFL  EDEMA: noted swelling left knee normal for this time s/p; small area of redness where hit by elliptical distal incision but is healing; no oozing or draining noted    PALPATION: General soreness left knee; more medial joint line area  LOWER EXTREMITY ROM:  Active ROM Right eval Left eval Left 10/27/22  Hip flexion     Hip extension     Hip abduction     Hip adduction     Hip internal rotation     Hip external rotation     Knee flexion  98 102  Knee extension  -10 (lacking ) -5  Ankle dorsiflexion      Ankle plantarflexion     Ankle inversion     Ankle eversion      (Blank rows = not tested)  LOWER EXTREMITY MMT:  MMT Right eval Left eval Left 10/27/22  Hip flexion 5 4+ 4+  Hip extension     Hip abduction     Hip adduction     Hip internal rotation     Hip external rotation     Knee flexion   5  Knee extension 5 Fair quad set 4/5 4+  Ankle dorsiflexion 5 4+ 4+  Ankle plantarflexion     Ankle inversion     Ankle eversion      (Blank rows = not tested)   FUNCTIONAL TESTS:  5 times sit to stand: 31. 91 without hands  GAIT: Distance walked: 60 Assistive device utilized: Single point cane Level of assistance: SBA Comments: slow antalgic gait   TODAY'S TREATMENT:                                                                                                                              DATE:  10/29/22 Standing: Heel/toe raises 2 x 10 8" step knee drives for flexion x 2' Slant board 5 x 20"  Seated: Hamstring stretch with strap 5 x 20"  Nustep seat 9 x 5' for rom (unable to do Recumbent bike right knee will not tolerate)    10/27/22 Reassess  AROM MMT  Tandem stance with counter support 2 x 30"   Heel slide AAROM 10 x 5" SLR 2 x 10 Bridge 2  x10 Sidelying hip abduction 2 x 10   10/13/22 physical therapy evaluation and HEP instruction    PATIENT EDUCATION:  Education details: Patient educated on exam findings, POC, scope of PT, HEP. Person educated: Patient Education method: Explanation, Demonstration, and Handouts Education comprehension: verbalized understanding, returned demonstration, verbal cues required, and tactile cues required  HOME EXERCISE PROGRAM: Access Code: [email or web address] URL: [email or web address]  10/27/22 - Supine Bridge  - 2 x daily - 7 x weekly -  2 sets - 10 reps - Sidelying Hip Abduction  - 2 x daily - 7 x weekly - 2 sets - 10 reps - Supine Heel Slide with Strap  - 2 x daily - 7 x weekly - 1 sets - 10 reps - 5  second hold - Standing Tandem Balance with Counter Support  - 2 x daily - 7 x weekly - 1 sets - 10 reps - 30 second hold  Date: 10/13/2022 Prepared by: [facility]  Exercises - Supine Ankle Pumps  - 1 x daily - 7 x weekly - 3 sets - 15 reps - Supine Quadricep Sets  - 1 x daily - 7 x weekly - 3 sets - 15 reps - Supine Hip Abduction  - 1 x daily - 7 x weekly - 3 sets - 15 reps - Supine Active Straight Leg Raise  - 1 x daily - 7 x weekly - 3 sets - 15 reps - Supine Short Arc Quad  - 1 x daily - 7 x weekly - 3 sets - 15 reps - Supine Heel Slides  - 1 x daily - 7 x weekly - 3 sets - 15 reps - Seated Long Arc Quad  - 1 x daily - 7 x weekly - 3 sets - 15 reps - Seated Knee Flexion Stretch  - 1 x daily - 7 x weekly - 3 sets - 15 reps  ASSESSMENT:  CLINICAL IMPRESSION: Late arrival today; patient not using AD on arrival despite recent fall and recommendation from therapist. Patient complains of some right knee (non-surgical knee) pain during standing exercise today and is unable to perform recumbent bike due to right knee not able to tolerate.  Patient will continue to benefit from skilled therapy services to reduce remaining deficits and improve functional ability.    OBJECTIVE IMPAIRMENTS: Abnormal gait, decreased activity tolerance, decreased balance, decreased endurance, decreased mobility, difficulty walking, decreased ROM, decreased strength, dizziness, hypomobility, increased edema, increased fascial restrictions, impaired perceived functional ability, increased muscle spasms, impaired flexibility, and pain.   ACTIVITY LIMITATIONS: carrying, lifting, bending, standing, squatting, sleeping, stairs, transfers, bed mobility, locomotion level, and caring for others  PARTICIPATION LIMITATIONS: meal prep, cleaning, laundry, driving, shopping, and community activity   REHAB POTENTIAL: Good  CLINICAL DECISION MAKING: Stable/uncomplicated  EVALUATION COMPLEXITY: Low   GOALS: Goals reviewed  with patient? No  SHORT TERM GOALS: Target date: 11/02/2022 Patient will be independent with initial HEP Baseline: Goal status: INITIAL  2.  Patient will improve left knee flexion to 110 degrees to improve ease of navigating steps to enter/exit home Baseline: 98 Goal status: INITIAL   LONG TERM GOALS: Target date: 11/10/2021  Patient will be independent in self management strategies to improve quality of life and functional outcomes.  Baseline:  Goal status: INITIAL  2.  Patient will improve FOTO score to predicted value Baseline: 36 Goal status: INITIAL  3.  Patient will increase left leg MMTs to 5/5 without pain to promote return to ambulation community distances with minimal deviation.  Baseline:  Goal status: INITIAL  4.  Patient will improve left knee mobility to -2 to 120 to improve ability to transfer without use of hands all surfaces Baseline:  Goal status: INITIAL  5.  Patient will improve 5 times sit to stand score from 31.91 sec to 24 sec to demonstrate improved functional mobility and increased lower extremity strength.  Baseline:  Goal status: INITIAL   PLAN:  PT FREQUENCY: 2x/week  PT DURATION: 4 weeks  PLANNED INTERVENTIONS:  Therapeutic exercises, Therapeutic activity, Neuromuscular re-education, Balance training, Gait training, Patient/Family education, Joint manipulation, Joint mobilization, Stair training, Orthotic/Fit training, DME instructions, Aquatic Therapy, Dry Needling, Electrical stimulation, Spinal manipulation, Spinal mobilization, Cryotherapy, Moist heat, Compression bandaging, scar mobilization, Splintting, Taping, Traction, Ultrasound, Ionotophoresis '4mg'$ /ml Dexamethasone, and Manual therapy   PLAN FOR NEXT SESSION: Progress knee mobility and strength as able; patient would like to finish up with therapy at the end of the year.  8:13 AM, 10/29/22 Shylah Dossantos [place] MPT [facility] [place] [phone]

## 2022-11-02 ENCOUNTER — Ambulatory Visit (HOSPITAL_COMMUNITY): Payer: No Typology Code available for payment source

## 2022-11-02 ENCOUNTER — Encounter (HOSPITAL_COMMUNITY): Payer: No Typology Code available for payment source

## 2022-11-02 DIAGNOSIS — R262 Difficulty in walking, not elsewhere classified: Secondary | ICD-10-CM

## 2022-11-02 DIAGNOSIS — M1712 Unilateral primary osteoarthritis, left knee: Secondary | ICD-10-CM | POA: Diagnosis not present

## 2022-11-02 DIAGNOSIS — M25562 Pain in left knee: Secondary | ICD-10-CM

## 2022-11-02 DIAGNOSIS — G8929 Other chronic pain: Secondary | ICD-10-CM

## 2022-11-02 NOTE — Therapy (Signed)
OUTPATIENT PHYSICAL THERAPY LOWER EXTREMITY TREATMENT   Patient Name: [name] MRN: [id number] DOB:1961-09-10, 61 y.o., female Today's Date: 11/02/2022    END OF SESSION:  PT End of Session - 11/02/22 1354     Visit Number 4    Number of Visits 8    Date for PT Re-Evaluation 11/10/22    Authorization Type [employer]; [place] Preferred    Authorization Time Period no vl, no auth required    Authorization - Number of Visits 22    PT Start Time 8185    PT Stop Time 1435    PT Time Calculation (min) 42 min    Activity Tolerance Patient tolerated treatment well    Behavior During Therapy WFL for tasks assessed/performed             Past Medical History:  Diagnosis Date   Anxiety    Arthritis    Breast cancer ([place])    Status post bilateral mastectomy   Breast reconstruction deformity    Complication of anesthesia    shaking-after mastectomies-resp and hr dropped   Depression, major    suicidal thoughts -implants, mental health, 05/11   Fibromyalgia    GERD (gastroesophageal reflux disease)    Hypothyroidism    Narcolepsy    OSA on CPAP    PONV (postoperative nausea and vomiting)    Sleep apnea with use of continuous positive airway pressure (CPAP)    AHI 20 in HST, titration to 6 cm water - residual AHi 6 . remaining hypersomnic.    Past Surgical History:  Procedure Laterality Date   ARTHRODESIS METATARSALPHALANGEAL JOINT (MTPJ) Right 07/28/2017   Procedure: ARTHRODESIS  RIGHT HALLUX METATARSALPHALANGEAL JOINT (MTPJ);  Surgeon: Wylene Simmer, MD;  Location: [facility];  Service: Orthopedics;  Laterality: Right;   Breast reconstructioin  7/09   flaps-bilat mastectomies   BTL     CARPAL TUNNEL RELEASE  2011   both hands   CARPOMETACARPEL SUSPENSION PLASTY Right 08/29/2014   Procedure: REMOVAL DERMASPAN SUSPENSIONPLASTY RIGHT THUMB ABDUCTOR POLLIS TRANSFER;  Surgeon: Daryll Brod, MD;  Location: [facility];  Service: Orthopedics;   Laterality: Right;   CATARACT EXTRACTION W/PHACO Right 12/17/2019   Procedure: CATARACT EXTRACTION PHACO AND INTRAOCULAR LENS PLACEMENT (IOC);  Surgeon: Baruch Goldmann, MD;  Location: [facility];  Service: Ophthalmology;  Laterality: Right;  CDE: 6.54   CATARACT EXTRACTION W/PHACO Left 12/31/2019   Procedure: CATARACT EXTRACTION PHACO AND INTRAOCULAR LENS PLACEMENT (IOC);  Surgeon: Baruch Goldmann, MD;  Location: [facility];  Service: Ophthalmology;  Laterality: Left;  CDE: 3.66   COLONOSCOPY W/ POLYPECTOMY  4/03   tubulovillous adenoma   FINGER ARTHROSCOPY WITH CARPOMETACARPEL ([place]) ARTHROPLASTY Right 06/20/2014   Procedure: ARTHROSCOPY RIGHT THUMB CARPOMETACARPEL GRAFT JACKET INTERPOSITION PARTIAL TRAPEZIECTOMY POSSIBLE OPEN;  Surgeon: Daryll Brod, MD;  Location: [facility];  Service: Orthopedics;  Laterality: Right;   GANGLION CYST EXCISION     both [phone]   HAMMER TOE SURGERY Right 07/28/2017   Procedure: THIRD HAMMERTOE CORRECTION;  Surgeon: Wylene Simmer, MD;  Location: [facility];  Service: Orthopedics;  Laterality: Right;   HARDWARE REMOVAL Right 07/28/2017   Procedure: REMOVAL OF DEEP IMPLANTS;  Surgeon: Wylene Simmer, MD;  Location: [facility];  Service: Orthopedics;  Laterality: Right;   hysterestomy  2005   KNEE ARTHROPLASTY Left 09/15/2022   Procedure: COMPUTER ASSISTED TOTAL KNEE ARTHROPLASTY;  Surgeon: Rod Can, MD;  Location: [facility];  Service: Orthopedics;  Laterality: Left;  MASTECTOMY Bilateral 2008   post- a -cath  2008   right knee arthroscopy     TOTAL SHOULDER ARTHROPLASTY Left 01/13/2017   Procedure: LEFT TOTAL SHOULDER ARTHROPLASTY;  Surgeon: Tania Ade, MD;  Location: [facility];  Service: Orthopedics;  Laterality: Left;  LEFT TOTAL SHOULDER ARTHROPLASTY   TOTAL SHOULDER ARTHROPLASTY Right 02/11/2022   Procedure: TOTAL SHOULDER ARTHROPLASTY;  Surgeon: Nicholes Stairs, MD;  Location: [facility];  Service: Orthopedics;  Laterality:  Right;  150   TRIGGER FINGER RELEASE Right 08/29/2014   Procedure: RELEASE TRIGGER FINGER/A-1 PULLEY;  Surgeon: Daryll Brod, MD;  Location: [facility];  Service: Orthopedics;  Laterality: Right;   wisdom teeth out     Patient Active Problem List   Diagnosis Date Noted   Osteoarthritis of left knee 09/15/2022   History of total shoulder replacement, right 02/11/2022   Osteoarthritis of left shoulder 01/13/2017   S/P shoulder replacement, left 01/13/2017   OSA on CPAP 04/01/2015   Subacute confusional state 04/01/2015   Hallucinations, visual 04/01/2015   Hypnapompic hallucinations 04/01/2015   Sleep apnea with use of continuous positive airway pressure (CPAP)    Hypersomnia, persistent 03/30/2013   Obstructive sleep apnea (adult) (pediatric) 03/30/2013   CONSTIPATION 11/18/2008   RECTAL BLEEDING 11/18/2008   HEMORRHOIDS, INTERNAL 11/15/2008   DIVERTICULOSIS, COLON 11/15/2008   CELLULITIS/ABSCESS, TRUNK 02/22/2008   LOW BACK PAIN 02/22/2008   DIZZINESS 02/22/2008   FATIGUE 02/22/2008   PALPITATIONS 02/22/2008   MURMUR, CARDIAC, UNDIAGNOSED 02/22/2008   BREAST CANCER, HX OF 02/22/2008   COUGH 01/05/2008   DEPRESSION 07/05/2007   UTERINE POLYP 07/05/2007   FIBROMYALGIA 07/05/2007   COLONIC POLYPS, HX OF 07/05/2007   HX, PERSONAL, CERVICAL DYSPLASIA 07/05/2007    PCP: Ginger Organ. MD  REFERRING PROVIDER: Rod Can, [place]  REFERRING DIAG: M17.12 (ICD-10-CM) - Osteoarthritis of left knee  THERAPY DIAG:  Left knee pain, unspecified chronicity  Primary osteoarthritis of left knee  Difficulty in walking, not elsewhere classified  Chronic pain of left knee  Rationale for Evaluation and Treatment: Rehabilitation  ONSET DATE: 09/15/2022  SUBJECTIVE:   SUBJECTIVE STATEMENT: Late arrival today; reports she is still sore from her fall; she hit the back of her head and low back and hips are still tender. Left knee is still  sore as well. States tandem stance last visit made her balance "off the rest of the day" after last visit.  Trying to do exercises at home.     Eval: Patient s/p left TKA 09/15/22; no home therapy; since surgery injured last week trying to water plants and a piece of an elliptical hit her in the knee and is quite sore.  PERTINENT HISTORY: Both shoulders have been replaced Right knee needs to be replaced Recently has suffered with vertigo PAIN:  Are you having pain? Yes: NPRS scale: 5/10 Pain location: left knee Pain description: aching Aggravating factors: end of the day is worse Relieving factors: rest and medication  PRECAUTIONS: None  WEIGHT BEARING RESTRICTIONS: No  FALLS:  Has patient fallen in last 6 months? Yes. Number of falls 10 plus before surgery  LIVING ENVIRONMENT: Lives with: lives with their spouse Lives in: House/apartment Stairs: Yes: External: 6 steps; on right going up Has following equipment at home: Single point cane, Walker - 2 wheeled, shower chair, bed side commode, and Grab bars  OCCUPATION: on disability  PLOF: Independent with basic ADLs  PATIENT GOALS: walk better; walk without AD  NEXT  MD VISIT:   OBJECTIVE:   DIAGNOSTIC FINDINGS:   PATIENT SURVEYS:  FOTO 10  COGNITION: Overall cognitive status: Within functional limits for tasks assessed     SENSATION: WFL  EDEMA: noted swelling left knee normal for this time s/p; small area of redness where hit by elliptical distal incision but is healing; no oozing or draining noted    PALPATION: General soreness left knee; more medial joint line area  LOWER EXTREMITY ROM:  Active ROM Right eval Left eval Left 10/27/22  Hip flexion     Hip extension     Hip abduction     Hip adduction     Hip internal rotation     Hip external rotation     Knee flexion  98 102  Knee extension  -10 (lacking ) -5  Ankle dorsiflexion     Ankle plantarflexion     Ankle inversion     Ankle eversion       (Blank rows = not tested)  LOWER EXTREMITY MMT:  MMT Right eval Left eval Left 10/27/22  Hip flexion 5 4+ 4+  Hip extension     Hip abduction     Hip adduction     Hip internal rotation     Hip external rotation     Knee flexion   5  Knee extension 5 Fair quad set 4/5 4+  Ankle dorsiflexion 5 4+ 4+  Ankle plantarflexion     Ankle inversion     Ankle eversion      (Blank rows = not tested)   FUNCTIONAL TESTS:  5 times sit to stand: 31. 91 without hands  GAIT: Distance walked: 60 Assistive device utilized: Single point cane Level of assistance: SBA Comments: slow antalgic gait   TODAY'S TREATMENT:                                                                                                                              DATE:  11/02/22 Nustep seat 9 x 5' for rom level 2 (unable to do Recumbent bike right knee will not tolerate)  Standing: Heel/toe raises 2 x 10 Squats 2 x 10 to chair target 8" box left knee drives x '2\' 4"'$  step ups 2 x 10 4" lateral step ups 2 x 10 Tandem stance x 30" each Standing on foam with small BOS x 30"   Sitting: 2# left LAQ's 3 x 10 Hamstring stretch with strap 10 x 10"   10/29/22 Standing: Heel/toe raises 2 x 10 8" step knee drives for flexion x 2' Slant board 5 x 20"  Seated: Hamstring stretch with strap 5 x 20"  Nustep seat 9 x 5' for rom (unable to do Recumbent bike right knee will not tolerate)    10/27/22 Reassess  AROM MMT  Tandem stance with counter support 2 x 30"   Heel slide AAROM 10 x 5" SLR 2 x 10 Bridge 2  x10 Sidelying hip abduction  2 x 10   10/13/22 physical therapy evaluation and HEP instruction    PATIENT EDUCATION:  Education details: Patient educated on exam findings, POC, scope of PT, HEP. Person educated: Patient Education method: Explanation, Demonstration, and Handouts Education comprehension: verbalized understanding, returned demonstration, verbal cues required, and tactile cues  required  HOME EXERCISE PROGRAM: Access Code: [email or web address] URL: [email or web address]  10/27/22 - Supine Bridge  - 2 x daily - 7 x weekly - 2 sets - 10 reps - Sidelying Hip Abduction  - 2 x daily - 7 x weekly - 2 sets - 10 reps - Supine Heel Slide with Strap  - 2 x daily - 7 x weekly - 1 sets - 10 reps - 5 second hold - Standing Tandem Balance with Counter Support  - 2 x daily - 7 x weekly - 1 sets - 10 reps - 30 second hold  Date: 10/13/2022 Prepared by: [facility]  Exercises - Supine Ankle Pumps  - 1 x daily - 7 x weekly - 3 sets - 15 reps - Supine Quadricep Sets  - 1 x daily - 7 x weekly - 3 sets - 15 reps - Supine Hip Abduction  - 1 x daily - 7 x weekly - 3 sets - 15 reps - Supine Active Straight Leg Raise  - 1 x daily - 7 x weekly - 3 sets - 15 reps - Supine Short Arc Quad  - 1 x daily - 7 x weekly - 3 sets - 15 reps - Supine Heel Slides  - 1 x daily - 7 x weekly - 3 sets - 15 reps - Seated Long Arc Quad  - 1 x daily - 7 x weekly - 3 sets - 15 reps - Seated Knee Flexion Stretch  - 1 x daily - 7 x weekly - 3 sets - 15 reps  ASSESSMENT:  CLINICAL IMPRESSION: Late arrival today; patient not using AD on arrival despite recent fall and recommendation from therapist. Patient complains of some right knee (non-surgical knee) pain during standing exercise today and is unable to perform recumbent bike due to right knee not able to tolerate.  Patient will continue to benefit from skilled therapy services to reduce remaining deficits and improve functional ability.    OBJECTIVE IMPAIRMENTS: Abnormal gait, decreased activity tolerance, decreased balance, decreased endurance, decreased mobility, difficulty walking, decreased ROM, decreased strength, dizziness, hypomobility, increased edema, increased fascial restrictions, impaired perceived functional ability, increased muscle spasms, impaired flexibility, and pain.   ACTIVITY LIMITATIONS: carrying, lifting, bending, standing,  squatting, sleeping, stairs, transfers, bed mobility, locomotion level, and caring for others  PARTICIPATION LIMITATIONS: meal prep, cleaning, laundry, driving, shopping, and community activity   REHAB POTENTIAL: Good  CLINICAL DECISION MAKING: Stable/uncomplicated  EVALUATION COMPLEXITY: Low   GOALS: Goals reviewed with patient? No  SHORT TERM GOALS: Target date: 11/02/2022 Patient will be independent with initial HEP Baseline: Goal status: INITIAL  2.  Patient will improve left knee flexion to 110 degrees to improve ease of navigating steps to enter/exit home Baseline: 98 Goal status: INITIAL   LONG TERM GOALS: Target date: 11/10/2021  Patient will be independent in self management strategies to improve quality of life and functional outcomes.  Baseline:  Goal status: INITIAL  2.  Patient will improve FOTO score to predicted value Baseline: 36 Goal status: INITIAL  3.  Patient will increase left leg MMTs to 5/5 without pain to promote return to ambulation community distances with minimal deviation.  Baseline:  Goal status:  INITIAL  4.  Patient will improve left knee mobility to -2 to 120 to improve ability to transfer without use of hands all surfaces Baseline:  Goal status: INITIAL  5.  Patient will improve 5 times sit to stand score from 31.91 sec to 24 sec to demonstrate improved functional mobility and increased lower extremity strength.  Baseline:  Goal status: INITIAL   PLAN:  PT FREQUENCY: 2x/week  PT DURATION: 4 weeks  PLANNED INTERVENTIONS:  Therapeutic exercises, Therapeutic activity, Neuromuscular re-education, Balance training, Gait training, Patient/Family education, Joint manipulation, Joint mobilization, Stair training, Orthotic/Fit training, DME instructions, Aquatic Therapy, Dry Needling, Electrical stimulation, Spinal manipulation, Spinal mobilization, Cryotherapy, Moist heat, Compression bandaging, scar mobilization, Splintting, Taping,  Traction, Ultrasound, Ionotophoresis '4mg'$ /ml Dexamethasone, and Manual therapy   PLAN FOR NEXT SESSION: Progress knee mobility and strength as able; patient would like to finish up with therapy at the end of the year.  2:35 PM, 11/02/22 Sumedh Shinsato Small Jenayah Antu MPT [facility] [place] [phone]

## 2022-11-04 ENCOUNTER — Ambulatory Visit (HOSPITAL_COMMUNITY): Payer: No Typology Code available for payment source

## 2022-11-04 ENCOUNTER — Encounter (HOSPITAL_COMMUNITY): Payer: No Typology Code available for payment source

## 2022-11-04 DIAGNOSIS — M25562 Pain in left knee: Secondary | ICD-10-CM

## 2022-11-04 DIAGNOSIS — G8929 Other chronic pain: Secondary | ICD-10-CM

## 2022-11-04 DIAGNOSIS — R262 Difficulty in walking, not elsewhere classified: Secondary | ICD-10-CM

## 2022-11-04 DIAGNOSIS — M1712 Unilateral primary osteoarthritis, left knee: Secondary | ICD-10-CM

## 2022-11-04 NOTE — Therapy (Signed)
OUTPATIENT PHYSICAL THERAPY LOWER EXTREMITY DISCHARGE  PHYSICAL THERAPY DISCHARGE SUMMARY  Visits from Start of Care: 5  Current functional level related to goals / functional outcomes: See below   Remaining deficits: See below   Education / Equipment: See below   Patient agrees to discharge. Patient goals were partially met. Patient is being discharged due to the patient's request.    Patient Name: [name] MRN: [id number] DOB:[birth year], 61 y.o., female Today's Date: 11/04/2022    END OF SESSION:  PT End of Session - 11/04/22 1309     Visit Number 5    Number of Visits 8    Date for PT Re-Evaluation 11/10/22    Authorization Type [employer]; [place] Preferred    Authorization Time Period no vl, no auth required    Authorization - Number of Visits 40    PT Start Time 6438    PT Stop Time 1332    PT Time Calculation (min) 25 min    Activity Tolerance Patient tolerated treatment well    Behavior During Therapy WFL for tasks assessed/performed             Past Medical History:  Diagnosis Date   Anxiety    Arthritis    Breast cancer ([place])    Status post bilateral mastectomy   Breast reconstruction deformity    Complication of anesthesia    shaking-after mastectomies-resp and hr dropped   Depression, major    suicidal thoughts -implants, mental health, 05/11   Fibromyalgia    GERD (gastroesophageal reflux disease)    Hypothyroidism    Narcolepsy    OSA on CPAP    PONV (postoperative nausea and vomiting)    Sleep apnea with use of continuous positive airway pressure (CPAP)    AHI 20 in HST, titration to 6 cm water - residual AHi 6 . remaining hypersomnic.    Past Surgical History:  Procedure Laterality Date   ARTHRODESIS METATARSALPHALANGEAL JOINT (MTPJ) Right 07/28/2017   Procedure: ARTHRODESIS  RIGHT HALLUX METATARSALPHALANGEAL JOINT (MTPJ);  Surgeon: Wylene Simmer, MD;  Location: [facility];  Service: Orthopedics;  Laterality:  Right;   Breast reconstructioin  7/09   flaps-bilat mastectomies   BTL     CARPAL TUNNEL RELEASE  2011   both hands   CARPOMETACARPEL SUSPENSION PLASTY Right 08/29/2014   Procedure: REMOVAL DERMASPAN SUSPENSIONPLASTY RIGHT THUMB ABDUCTOR POLLIS TRANSFER;  Surgeon: Daryll Brod, MD;  Location: [facility];  Service: Orthopedics;  Laterality: Right;   CATARACT EXTRACTION W/PHACO Right 12/17/2019   Procedure: CATARACT EXTRACTION PHACO AND INTRAOCULAR LENS PLACEMENT (IOC);  Surgeon: Baruch Goldmann, MD;  Location: [facility];  Service: Ophthalmology;  Laterality: Right;  CDE: 6.54   CATARACT EXTRACTION W/PHACO Left 12/31/2019   Procedure: CATARACT EXTRACTION PHACO AND INTRAOCULAR LENS PLACEMENT (IOC);  Surgeon: Baruch Goldmann, MD;  Location: [facility];  Service: Ophthalmology;  Laterality: Left;  CDE: 3.66   COLONOSCOPY W/ POLYPECTOMY  4/03   tubulovillous adenoma   FINGER ARTHROSCOPY WITH CARPOMETACARPEL ([place]) ARTHROPLASTY Right 06/20/2014   Procedure: ARTHROSCOPY RIGHT THUMB CARPOMETACARPEL GRAFT JACKET INTERPOSITION PARTIAL TRAPEZIECTOMY POSSIBLE OPEN;  Surgeon: Daryll Brod, MD;  Location: [facility];  Service: Orthopedics;  Laterality: Right;   GANGLION CYST EXCISION     both [phone]   HAMMER TOE SURGERY Right 07/28/2017   Procedure: THIRD HAMMERTOE CORRECTION;  Surgeon: Wylene Simmer, MD;  Location: [facility];  Service: Orthopedics;  Laterality: Right;   HARDWARE REMOVAL Right 07/28/2017  Procedure: REMOVAL OF DEEP IMPLANTS;  Surgeon: Wylene Simmer, MD;  Location: [facility];  Service: Orthopedics;  Laterality: Right;   hysterestomy  2005   KNEE ARTHROPLASTY Left 09/15/2022   Procedure: COMPUTER ASSISTED TOTAL KNEE ARTHROPLASTY;  Surgeon: Rod Can, MD;  Location: [facility];  Service: Orthopedics;  Laterality: Left;   MASTECTOMY Bilateral 2008   post- a -cath  2008   right knee arthroscopy     TOTAL SHOULDER ARTHROPLASTY Left 01/13/2017    Procedure: LEFT TOTAL SHOULDER ARTHROPLASTY;  Surgeon: Tania Ade, MD;  Location: [facility];  Service: Orthopedics;  Laterality: Left;  LEFT TOTAL SHOULDER ARTHROPLASTY   TOTAL SHOULDER ARTHROPLASTY Right 02/11/2022   Procedure: TOTAL SHOULDER ARTHROPLASTY;  Surgeon: Nicholes Stairs, MD;  Location: [facility];  Service: Orthopedics;  Laterality: Right;  150   TRIGGER FINGER RELEASE Right 08/29/2014   Procedure: RELEASE TRIGGER FINGER/A-1 PULLEY;  Surgeon: Daryll Brod, MD;  Location: [facility];  Service: Orthopedics;  Laterality: Right;   wisdom teeth out     Patient Active Problem List   Diagnosis Date Noted   Osteoarthritis of left knee 09/15/2022   History of total shoulder replacement, right 02/11/2022   Osteoarthritis of left shoulder 01/13/2017   S/P shoulder replacement, left 01/13/2017   OSA on CPAP 04/01/2015   Subacute confusional state 04/01/2015   Hallucinations, visual 04/01/2015   Hypnapompic hallucinations 04/01/2015   Sleep apnea with use of continuous positive airway pressure (CPAP)    Hypersomnia, persistent 03/30/2013   Obstructive sleep apnea (adult) (pediatric) 03/30/2013   CONSTIPATION 11/18/2008   RECTAL BLEEDING 11/18/2008   HEMORRHOIDS, INTERNAL 11/15/2008   DIVERTICULOSIS, COLON 11/15/2008   CELLULITIS/ABSCESS, TRUNK 02/22/2008   LOW BACK PAIN 02/22/2008   DIZZINESS 02/22/2008   FATIGUE 02/22/2008   PALPITATIONS 02/22/2008   MURMUR, CARDIAC, UNDIAGNOSED 02/22/2008   BREAST CANCER, HX OF 02/22/2008   COUGH 01/05/2008   DEPRESSION 07/05/2007   UTERINE POLYP 07/05/2007   FIBROMYALGIA 07/05/2007   COLONIC POLYPS, HX OF 07/05/2007   HX, PERSONAL, CERVICAL DYSPLASIA 07/05/2007    PCP: Ginger Organ. MD  REFERRING PROVIDER: Rod Can, [place]  REFERRING DIAG: M17.12 (ICD-10-CM) - Osteoarthritis of left knee  THERAPY DIAG:  Left knee pain, unspecified chronicity  Primary osteoarthritis of left  knee  Difficulty in walking, not elsewhere classified  Chronic pain of left knee  Rationale for Evaluation and Treatment: Rehabilitation  ONSET DATE: 09/15/2022  SUBJECTIVE:   SUBJECTIVE STATEMENT: "I'm really stressed, I have a lot to do at home to prepare for company for the holidays"  patient request discharge today.      Eval: Patient s/p left TKA 09/15/22; no home therapy; since surgery injured last week trying to water plants and a piece of an elliptical hit her in the knee and is quite sore.  PERTINENT HISTORY: Both shoulders have been replaced Right knee needs to be replaced Recently has suffered with vertigo PAIN:  Are you having pain? Yes: NPRS scale: 3/10 Pain location: left knee Pain description: aching Aggravating factors: end of the day is worse Relieving factors: rest and medication  PRECAUTIONS: None  WEIGHT BEARING RESTRICTIONS: No  FALLS:  Has patient fallen in last 6 months? Yes. Number of falls 10 plus before surgery  LIVING ENVIRONMENT: Lives with: lives with their spouse Lives in: House/apartment Stairs: Yes: External: 6 steps; on right going up Has following equipment at home: Single point cane, Walker - 2 wheeled, shower chair, bed  side commode, and Grab bars  OCCUPATION: on disability  PLOF: Independent with basic ADLs  PATIENT GOALS: walk better; walk without AD  NEXT MD VISIT:   OBJECTIVE:   DIAGNOSTIC FINDINGS:   PATIENT SURVEYS:  FOTO 101  COGNITION: Overall cognitive status: Within functional limits for tasks assessed     SENSATION: WFL  EDEMA: noted swelling left knee normal for this time s/p; small area of redness where hit by elliptical distal incision but is healing; no oozing or draining noted    PALPATION: General soreness left knee; more medial joint line area  LOWER EXTREMITY ROM:  Active ROM Right eval Left eval Left 10/27/22 Left 11/04/22  Hip flexion      Hip extension      Hip abduction      Hip  adduction      Hip internal rotation      Hip external rotation      Knee flexion  98 102 120  Knee extension  -10 (lacking ) -5 0  Ankle dorsiflexion      Ankle plantarflexion      Ankle inversion      Ankle eversion       (Blank rows = not tested)  LOWER EXTREMITY MMT:  MMT Right eval Left eval Left 10/27/22 Left 11/04/22  Hip flexion 5 4+ 4+ 5  Hip extension      Hip abduction      Hip adduction      Hip internal rotation      Hip external rotation      Knee flexion   5 5  Knee extension 5 Fair quad set 4/5 4+ 5  Ankle dorsiflexion 5 4+ 4+ 5  Ankle plantarflexion      Ankle inversion      Ankle eversion       (Blank rows = not tested)   FUNCTIONAL TESTS:  5 times sit to stand: 31. 91 without hands  GAIT: Distance walked: 60 Assistive device utilized: Single point cane Level of assistance: SBA Comments: slow antalgic gait   TODAY'S TREATMENT:                                                                                                                              DATE:  11/04/22 Nustep seat 9 x 5' for rom level 2 (unable to do Recumbent bike right knee will not tolerate)  Progress note FOTO 50 5 x sit to stand 17.48 sec MMT's and ROM (see above)  11/02/22 Nustep seat 9 x 5' for rom level 2 (unable to do Recumbent bike right knee will not tolerate)  Standing: Heel/toe raises 2 x 10 Squats 2 x 10 to chair target 8" box left knee drives x <[id number]>_8<\/[id number]>_0  step ups 2 x 10 4" lateral step ups 2 x 10 Tandem stance x 30" each Standing on foam with small BOS x 30"   Sitting: 2# left LAQ's 3 x 10 Hamstring  stretch with strap 10 x 10"   10/29/22 Standing: Heel/toe raises 2 x 10 8" step knee drives for flexion x 2' Slant board 5 x 20"  Seated: Hamstring stretch with strap 5 x 20"  Nustep seat 9 x 5' for rom (unable to do Recumbent bike right knee will not tolerate)    10/27/22 Reassess  AROM MMT  Tandem stance with counter support 2 x 30"    Heel slide AAROM 10 x 5" SLR 2 x 10 Bridge 2  x10 Sidelying hip abduction 2 x 10   10/13/22 physical therapy evaluation and HEP instruction    PATIENT EDUCATION:  Education details: Patient educated on exam findings, POC, scope of PT, HEP. Person educated: Patient Education method: Explanation, Demonstration, and Handouts Education comprehension: verbalized understanding, returned demonstration, verbal cues required, and tactile cues required  HOME EXERCISE PROGRAM: Access Code: [email or web address] URL: [email or web address]  10/27/22 - Supine Bridge  - 2 x daily - 7 x weekly - 2 sets - 10 reps - Sidelying Hip Abduction  - 2 x daily - 7 x weekly - 2 sets - 10 reps - Supine Heel Slide with Strap  - 2 x daily - 7 x weekly - 1 sets - 10 reps - 5 second hold - Standing Tandem Balance with Counter Support  - 2 x daily - 7 x weekly - 1 sets - 10 reps - 30 second hold  Date: 10/13/2022 Prepared by: [facility]  Exercises - Supine Ankle Pumps  - 1 x daily - 7 x weekly - 3 sets - 15 reps - Supine Quadricep Sets  - 1 x daily - 7 x weekly - 3 sets - 15 reps - Supine Hip Abduction  - 1 x daily - 7 x weekly - 3 sets - 15 reps - Supine Active Straight Leg Raise  - 1 x daily - 7 x weekly - 3 sets - 15 reps - Supine Short Arc Quad  - 1 x daily - 7 x weekly - 3 sets - 15 reps - Supine Heel Slides  - 1 x daily - 7 x weekly - 3 sets - 15 reps - Seated Long Arc Quad  - 1 x daily - 7 x weekly - 3 sets - 15 reps - Seated Knee Flexion Stretch  - 1 x daily - 7 x weekly - 3 sets - 15 reps  ASSESSMENT:  CLINICAL IMPRESSION: Progress note today as patient requests discharge; she has met all but 1 set rehab goal; reviewed HEP and patient verbalizes understanding.    OBJECTIVE IMPAIRMENTS: Abnormal gait, decreased activity tolerance, decreased balance, decreased endurance, decreased mobility, difficulty walking, decreased ROM, decreased strength, dizziness, hypomobility, increased edema, increased  fascial restrictions, impaired perceived functional ability, increased muscle spasms, impaired flexibility, and pain.   ACTIVITY LIMITATIONS: carrying, lifting, bending, standing, squatting, sleeping, stairs, transfers, bed mobility, locomotion level, and caring for others  PARTICIPATION LIMITATIONS: meal prep, cleaning, laundry, driving, shopping, and community activity   REHAB POTENTIAL: Good  CLINICAL DECISION MAKING: Stable/uncomplicated  EVALUATION COMPLEXITY: Low   GOALS: Goals reviewed with patient? No  SHORT TERM GOALS: Target date: 11/02/2022 Patient will be independent with initial HEP Baseline: Goal status: MET  2.  Patient will improve left knee flexion to 110 degrees to improve ease of navigating steps to enter/exit home Baseline: 98 Goal status: MET   LONG TERM GOALS: Target date: 11/10/2021  Patient will be independent in self management strategies to improve quality of life and functional  outcomes.  Baseline:  Goal status: MET  2.  Patient will improve FOTO score to predicted value Baseline: 36 Goal status: IN PROGRESS  3.  Patient will increase left leg MMTs to 5/5 without pain to promote return to ambulation community distances with minimal deviation.  Baseline:  Goal status: MET  4.  Patient will improve left knee mobility to -2 to 120 to improve ability to transfer without use of hands all surfaces Baseline:  Goal status: MET  5.  Patient will improve 5 times sit to stand score from 31.91 sec to 24 sec to demonstrate improved functional mobility and increased lower extremity strength.  Baseline:  Goal status: MET   PLAN:  PT FREQUENCY: 2x/week  PT DURATION: 4 weeks  PLANNED INTERVENTIONS:  Therapeutic exercises, Therapeutic activity, Neuromuscular re-education, Balance training, Gait training, Patient/Family education, Joint manipulation, Joint mobilization, Stair training, Orthotic/Fit training, DME instructions, Aquatic Therapy, Dry  Needling, Electrical stimulation, Spinal manipulation, Spinal mobilization, Cryotherapy, Moist heat, Compression bandaging, scar mobilization, Splintting, Taping, Traction, Ultrasound, Ionotophoresis 48m/ml Dexamethasone, and Manual therapy   PLAN FOR NEXT SESSION: discharge  1:33 PM, 11/04/22 Mike Berntsen Small Aitana Burry MPT [facility] [place] #[phone]

## 2022-11-09 ENCOUNTER — Encounter (HOSPITAL_COMMUNITY): Payer: No Typology Code available for payment source

## 2022-11-11 ENCOUNTER — Encounter (HOSPITAL_COMMUNITY): Payer: No Typology Code available for payment source

## 2022-11-24 ENCOUNTER — Other Ambulatory Visit (HOSPITAL_COMMUNITY): Payer: Self-pay

## 2022-11-24 MED ORDER — WEGOVY 0.5 MG/0.5ML ~~LOC~~ SOAJ
0.5000 mg | SUBCUTANEOUS | 1 refills | Status: DC
  Filled 2022-11-24: qty 2, 28d supply, fill #0

## 2022-11-25 ENCOUNTER — Other Ambulatory Visit (HOSPITAL_COMMUNITY): Payer: Self-pay

## 2022-12-02 ENCOUNTER — Other Ambulatory Visit (HOSPITAL_COMMUNITY): Payer: Self-pay

## 2022-12-27 ENCOUNTER — Other Ambulatory Visit (HOSPITAL_COMMUNITY): Payer: Self-pay

## 2022-12-27 ENCOUNTER — Other Ambulatory Visit: Payer: Self-pay

## 2022-12-27 MED ORDER — WEGOVY 1 MG/0.5ML ~~LOC~~ SOAJ
1.0000 mg | SUBCUTANEOUS | 0 refills | Status: DC
  Filled 2022-12-27: qty 2, 28d supply, fill #0

## 2023-01-21 ENCOUNTER — Ambulatory Visit: Payer: No Typology Code available for payment source

## 2023-01-21 ENCOUNTER — Ambulatory Visit
Admission: EM | Admit: 2023-01-21 | Discharge: 2023-01-21 | Disposition: A | Discharge status: Home / Self Care | Payer: No Typology Code available for payment source | Attending: Family Medicine | Admitting: Family Medicine

## 2023-01-21 ENCOUNTER — Other Ambulatory Visit (HOSPITAL_COMMUNITY): Payer: Self-pay

## 2023-01-21 DIAGNOSIS — R062 Wheezing: Secondary | ICD-10-CM | POA: Diagnosis not present

## 2023-01-21 DIAGNOSIS — R0989 Other specified symptoms and signs involving the circulatory and respiratory systems: Secondary | ICD-10-CM

## 2023-01-21 DIAGNOSIS — J189 Pneumonia, unspecified organism: Secondary | ICD-10-CM | POA: Diagnosis not present

## 2023-01-21 DIAGNOSIS — R059 Cough, unspecified: Secondary | ICD-10-CM

## 2023-01-21 DIAGNOSIS — R0902 Hypoxemia: Secondary | ICD-10-CM

## 2023-01-21 DIAGNOSIS — R0602 Shortness of breath: Secondary | ICD-10-CM | POA: Diagnosis not present

## 2023-01-21 MED ORDER — ALBUTEROL SULFATE HFA 108 (90 BASE) MCG/ACT IN AERS: 2.0000 | INHALATION_SPRAY | RESPIRATORY_TRACT | 0 refills | Status: DC | PRN

## 2023-01-21 MED ORDER — GUAIFENESIN ER 600 MG PO TB12: 600.0000 mg | ORAL_TABLET | Freq: Two times a day (BID) | ORAL | 0 refills | Status: DC | PRN

## 2023-01-21 MED ORDER — DEXAMETHASONE SODIUM PHOSPHATE 10 MG/ML IJ SOLN
10.0000 mg | Freq: Once | INTRAMUSCULAR | Status: AC
  Administered 2023-01-21: 10 mg via INTRAMUSCULAR

## 2023-01-21 MED ORDER — IPRATROPIUM-ALBUTEROL 0.5-2.5 (3) MG/3ML IN SOLN
3.0000 mL | Freq: Once | RESPIRATORY_TRACT | Status: AC
  Administered 2023-01-21: 3 mL via RESPIRATORY_TRACT

## 2023-01-21 MED ORDER — AMOXICILLIN-POT CLAVULANATE 875-125 MG PO TABS: 1.0000 | ORAL_TABLET | Freq: Two times a day (BID) | ORAL | 0 refills | Status: DC

## 2023-01-21 MED ORDER — AZITHROMYCIN 250 MG PO TABS: ORAL_TABLET | ORAL | 0 refills | Status: DC

## 2023-01-21 MED ORDER — PREDNISONE 20 MG PO TABS: 40.0000 mg | ORAL_TABLET | Freq: Every day | ORAL | 0 refills | Status: DC

## 2023-01-21 NOTE — ED Provider Notes (Signed)
[facility]    CSN: [id number] Arrival date & time: 01/21/23  0912      History   Chief Complaint Chief Complaint  Patient presents with   Cough    HPI [name] is a 62 y.o. female.   Patient presenting today with 3-day history of cough, congestion, eye redness, wheezing, shortness of breath.  Denies known fever, chest pain, abdominal pain, nausea vomiting or diarrhea.  So far tried Mucinex with mild relief of symptoms.  States she also has an expired inhaler at home but did not find any relief from this.  No known diagnosed chronic pulmonary disease apart from sleep apnea however he is a longtime former cigarette smoker.    Past Medical History:  Diagnosis Date   Anxiety    Arthritis    Breast cancer ([place])    Status post bilateral mastectomy   Breast reconstruction deformity    Complication of anesthesia    shaking-after mastectomies-resp and hr dropped   Depression, major    suicidal thoughts -implants, mental health, 05/11   Fibromyalgia    GERD (gastroesophageal reflux disease)    Hypothyroidism    Narcolepsy    OSA on CPAP    PONV (postoperative nausea and vomiting)    Sleep apnea with use of continuous positive airway pressure (CPAP)    AHI 20 in HST, titration to 6 cm water - residual AHi 6 . remaining hypersomnic.     Patient Active Problem List   Diagnosis Date Noted   Osteoarthritis of left knee 09/15/2022   History of total shoulder replacement, right 02/11/2022   Osteoarthritis of left shoulder 01/13/2017   S/P shoulder replacement, left 01/13/2017   OSA on CPAP 04/01/2015   Subacute confusional state 04/01/2015   Hallucinations, visual 04/01/2015   Hypnapompic hallucinations 04/01/2015   Sleep apnea with use of continuous positive airway pressure (CPAP)    Hypersomnia, persistent 03/30/2013   Obstructive sleep apnea (adult) (pediatric) 03/30/2013   CONSTIPATION 11/18/2008   RECTAL BLEEDING 11/18/2008   HEMORRHOIDS, INTERNAL  11/15/2008   DIVERTICULOSIS, COLON 11/15/2008   CELLULITIS/ABSCESS, TRUNK 02/22/2008   LOW BACK PAIN 02/22/2008   DIZZINESS 02/22/2008   FATIGUE 02/22/2008   PALPITATIONS 02/22/2008   MURMUR, CARDIAC, UNDIAGNOSED 02/22/2008   BREAST CANCER, HX OF 02/22/2008   COUGH 01/05/2008   DEPRESSION 07/05/2007   UTERINE POLYP 07/05/2007   FIBROMYALGIA 07/05/2007   COLONIC POLYPS, HX OF 07/05/2007   HX, PERSONAL, CERVICAL DYSPLASIA 07/05/2007    Past Surgical History:  Procedure Laterality Date   ARTHRODESIS METATARSALPHALANGEAL JOINT (MTPJ) Right 07/28/2017   Procedure: ARTHRODESIS  RIGHT HALLUX METATARSALPHALANGEAL JOINT (MTPJ);  Surgeon: Wylene Simmer, MD;  Location: [place];  Service: Orthopedics;  Laterality: Right;   Breast reconstructioin  7/09   flaps-bilat mastectomies   BTL     CARPAL TUNNEL RELEASE  2011   both hands   CARPOMETACARPEL SUSPENSION PLASTY Right 08/29/2014   Procedure: REMOVAL DERMASPAN SUSPENSIONPLASTY RIGHT THUMB ABDUCTOR POLLIS TRANSFER;  Surgeon: Daryll Brod, MD;  Location: [place];  Service: Orthopedics;  Laterality: Right;   CATARACT EXTRACTION W/PHACO Right 12/17/2019   Procedure: CATARACT EXTRACTION PHACO AND INTRAOCULAR LENS PLACEMENT (IOC);  Surgeon: Baruch Goldmann, MD;  Location: [facility];  Service: Ophthalmology;  Laterality: Right;  CDE: 6.54   CATARACT EXTRACTION W/PHACO Left 12/31/2019   Procedure: CATARACT EXTRACTION PHACO AND INTRAOCULAR LENS PLACEMENT (IOC);  Surgeon: Baruch Goldmann, MD;  Location: [facility];  Service: Ophthalmology;  Laterality: Left;  CDE: 3.66   COLONOSCOPY W/ POLYPECTOMY  4/03   tubulovillous adenoma   FINGER ARTHROSCOPY WITH CARPOMETACARPEL ([place]) ARTHROPLASTY Right 06/20/2014   Procedure: ARTHROSCOPY RIGHT THUMB CARPOMETACARPEL GRAFT JACKET INTERPOSITION PARTIAL TRAPEZIECTOMY POSSIBLE OPEN;  Surgeon: Daryll Brod, MD;  Location: [place];  Service: Orthopedics;  Laterality: Right;   GANGLION  CYST EXCISION     both [id number]   HAMMER TOE SURGERY Right 07/28/2017   Procedure: THIRD HAMMERTOE CORRECTION;  Surgeon: Wylene Simmer, MD;  Location: [place];  Service: Orthopedics;  Laterality: Right;   HARDWARE REMOVAL Right 07/28/2017   Procedure: REMOVAL OF DEEP IMPLANTS;  Surgeon: Wylene Simmer, MD;  Location: [place];  Service: Orthopedics;  Laterality: Right;   hysterestomy  2005   KNEE ARTHROPLASTY Left 09/15/2022   Procedure: COMPUTER ASSISTED TOTAL KNEE ARTHROPLASTY;  Surgeon: Rod Can, MD;  Location: [facility];  Service: Orthopedics;  Laterality: Left;   MASTECTOMY Bilateral 2008   post- a -cath  2008   right knee arthroscopy     TOTAL SHOULDER ARTHROPLASTY Left 01/13/2017   Procedure: LEFT TOTAL SHOULDER ARTHROPLASTY;  Surgeon: Tania Ade, MD;  Location: [place];  Service: Orthopedics;  Laterality: Left;  LEFT TOTAL SHOULDER ARTHROPLASTY   TOTAL SHOULDER ARTHROPLASTY Right 02/11/2022   Procedure: TOTAL SHOULDER ARTHROPLASTY;  Surgeon: Nicholes Stairs, MD;  Location: [place];  Service: Orthopedics;  Laterality: Right;  150   TRIGGER FINGER RELEASE Right 08/29/2014   Procedure: RELEASE TRIGGER FINGER/A-1 PULLEY;  Surgeon: Daryll Brod, MD;  Location: [place];  Service: Orthopedics;  Laterality: Right;   wisdom teeth out      OB History   No obstetric history on file.      Home Medications    Prior to Admission medications   Medication Sig Start Date End Date Taking? Authorizing Provider  albuterol (VENTOLIN HFA) 108 (90 Base) MCG/ACT inhaler Inhale 2 puffs into the lungs every 4 (four) hours as needed for wheezing or shortness of breath. 01/21/23  Yes Volney American, PA-C  amoxicillin-clavulanate (AUGMENTIN) 875-125 MG tablet Take 1 tablet by mouth every 12 (twelve) hours. 01/21/23  Yes Volney American, PA-C  azithromycin (ZITHROMAX) 250 MG tablet Take first 2 tablets together, then 1 every day until  finished. 01/21/23  Yes Volney American, PA-C  guaiFENesin (MUCINEX) 600 MG 12 hr tablet Take 1 tablet (600 mg total) by mouth 2 (two) times daily as needed. 01/21/23  Yes Volney American, PA-C  predniSONE (DELTASONE) 20 MG tablet Take 2 tablets (40 mg total) by mouth daily with breakfast. 01/21/23  Yes Volney American, PA-C  ARIPiprazole (ABILIFY) 20 MG tablet Take 20 mg by mouth daily. 07/10/19   [provider]  benztropine (COGENTIN) 1 MG tablet Take 1 mg by mouth at bedtime.  08/25/16   [provider]  busPIRone (BUSPAR) 10 MG tablet Take 10 mg by mouth 3 (three) times daily as needed (anxiety).    [provider]  diclofenac sodium (VOLTAREN) 1 % GEL Apply 1 application topically 4 (four) times daily as needed (pain.).     [provider]  escitalopram (LEXAPRO) 20 MG tablet Take 20 mg by mouth in the morning and at bedtime.    [provider]  lidocaine (LIDODERM) 5 % Place 1 patch onto the skin daily. Remove & Discard patch within 12 hours or as directed by MD 10/22/22   Volney American, PA-C  Melatonin 5 MG TABS Take  5 mg by mouth at bedtime.    [provider]  methocarbamol (ROBAXIN) 500 MG tablet Take 1 tablet (500 mg total) by mouth 4 (four) times daily. Patient taking differently: Take 500 mg by mouth every 8 (eight) hours as needed for muscle spasms. 02/12/22   McClung, Achilles Dunk D, PA  methylphenidate (RITALIN LA) 30 MG 24 hr capsule Take 30 mg by mouth in the morning. 09/06/16   [provider]  morphine (MS CONTIN) 30 MG 12 hr tablet Take 30 mg by mouth every 12 (twelve) hours.    [provider]  NON FORMULARY Pt uses a cpap nightly    [provider]  ondansetron (ZOFRAN) 4 MG tablet Take 1 tablet (4 mg total) by mouth every 8 (eight) hours as needed for nausea or vomiting. 09/16/22 09/16/23  Charlott Rakes, PA-C  pregabalin (LYRICA) 150 MG capsule Take 150 mg by mouth 2 (two) times  daily.    [provider]  propranolol ER (INDERAL LA) 120 MG 24 hr capsule Take 120 mg by mouth 2 (two) times daily.  08/08/19   [provider]  Semaglutide-Weight Management (WEGOVY) 0.5 MG/0.5ML SOAJ Inject 0.5 mg into the skin once a week. 08/09/22     Semaglutide-Weight Management (WEGOVY) 0.5 MG/0.5ML SOAJ Inject 0.5 mg into the skin once a week for 4 weeks 11/24/22     Semaglutide-Weight Management (WEGOVY) 1 MG/0.5ML SOAJ Inject 1 mg into the skin once a week. 12/27/22       Family History Family History  Problem Relation Age of Onset   Breast cancer Mother    Prostate cancer Father    Breast cancer Maternal Aunt        breast   Colon cancer Maternal Grandmother        age unknown, ? 84's   Breast cancer Maternal Grandmother    Colon cancer Cousin    Healthy Brother    Healthy Son    Healthy Daughter    Healthy Daughter    Leukemia Granddaughter     Social History Social History   Tobacco Use   Smoking status: Former    Packs/day: 0.25    Years: 15.00    Additional pack years: 0.00    Total pack years: 3.75    Types: Cigarettes    Quit date: 06/14/2012    Years since quitting: 10.6   Smokeless tobacco: Never   Tobacco comments:    quit smoking in 1999  Vaping Use   Vaping Use: Never used  Substance Use Topics   Alcohol use: Not Currently    Comment: glass wine or beer "every once in a while" per pt.   Drug use: No     Allergies   Sulfonamide derivatives   Review of Systems Review of Systems Per HPI  Physical Exam Triage Vital Signs ED Triage Vitals  Enc Vitals Group     BP 01/21/23 0932 96/60     Pulse --      Resp 01/21/23 0932 18     Temp 01/21/23 0932 98.5 F (36.9 C)     Temp Source 01/21/23 0932 Oral     SpO2 01/21/23 0932 (!) 89 %     Weight --      Height --      Head Circumference --      Peak Flow --      Pain Score 01/21/23 0931 0     Pain Loc --  Pain Edu? --      Excl. in [place]? --    No data  found.  Updated Vital Signs BP 96/60 (BP Location: Right Arm)   Temp 98.5 F (36.9 C) (Oral)   Resp 18   SpO2 95%   Visual Acuity Right Eye Distance:   Left Eye Distance:   Bilateral Distance:    Right Eye Near:   Left Eye Near:    Bilateral Near:     Physical Exam Vitals and nursing note reviewed.  Constitutional:      Appearance: Normal appearance.  HENT:     Head: Atraumatic.     Right Ear: Tympanic membrane and external ear normal.     Left Ear: Tympanic membrane and external ear normal.     Nose: Congestion present.     Mouth/Throat:     Mouth: Mucous membranes are moist.     Pharynx: Posterior oropharyngeal erythema present.  Eyes:     Extraocular Movements: Extraocular movements intact.     Conjunctiva/sclera: Conjunctivae normal.  Cardiovascular:     Rate and Rhythm: Normal rate and regular rhythm.     Heart sounds: Normal heart sounds.  Pulmonary:     Effort: Pulmonary effort is normal. No respiratory distress.     Breath sounds: Wheezing present.  Musculoskeletal:        General: Normal range of motion.     Cervical back: Normal range of motion and neck supple.  Skin:    General: Skin is warm and dry.  Neurological:     Mental Status: She is alert and oriented to person, place, and time.  Psychiatric:        Mood and Affect: Mood normal.        Thought Content: Thought content normal.      UC Treatments / Results  Labs (all labs ordered are listed, but only abnormal results are displayed) Labs Reviewed - No data to display  EKG   Radiology DG Chest 2 View  Result Date: 01/21/2023 CLINICAL DATA:  Three day history of productive cough with chest congestion and wheezing. EXAM: CHEST - 2 VIEW COMPARISON:  Radiographs 01/07/2017 and 12/01/2008.  CT 02/25/2009. FINDINGS: The heart size and mediastinal contours are stable. Diffuse extension way shin of the interstitial markings with central airway thickening. On the lateral view, there is increased  density projecting over the anterior aspect of the lower thoracic spine which could reflect a developing infiltrate. This is not well seen on the lateral view. There is no pleural effusion or pneumothorax. Postsurgical changes are present in the right breast and right axilla. There are degenerative changes throughout the thoracic spine. Previous bilateral total shoulder arthroplasty. IMPRESSION: Possible developing infiltrate in one of the lower lobes on the lateral view. Diffuse central airway thickening suggesting bronchitis. Followup PA and lateral chest X-ray is recommended in 3-4 weeks following therapy to ensure resolution and exclude underlying malignancy. Electronically Signed   By: Richardean Sale M.D.   On: 01/21/2023 09:53    Procedures Procedures (including critical care time)  Medications Ordered in UC Medications  ipratropium-albuterol (DUONEB) 0.5-2.5 (3) MG/3ML nebulizer solution 3 mL (3 mLs Nebulization Given 01/21/23 0943)  dexamethasone (DECADRON) injection 10 mg (10 mg Intramuscular Given 01/21/23 1016)    Initial Impression / Assessment and Plan / UC Course  I have reviewed the triage vital signs and the nursing notes.  Pertinent labs & imaging results that were available during my care of the patient were reviewed by  me and considered in my medical decision making (see chart for details).     Patient initially mildly hypoxic to 89% on room air, improved to 90 to 91% post DuoNeb treatment and ultimately 95% after DuoNeb and IM Decadron.  Her x-ray today is showing a possible infiltrate developing in the lower lobe, unclear which side at this time.  Will treat with prednisone, Augmentin, azithromycin, Mucinex, albuterol.  Discussed incentive spirometer use, pulse oximeter for home monitoring of oxygen saturations, ED follow-up for worsening symptoms.  Also recommended PCP follow-up within a week and discussed radiologist recommendations of repeat x-ray within a month to ensure  clearance and no other underlying abnormalities.  Final Clinical Impressions(s) / UC Diagnoses   Final diagnoses:  Pneumonia of lower lobe due to infectious organism, unspecified laterality  Hypoxia  SOB (shortness of breath)  Wheezing     Discharge Instructions      We have given you a breathing treatment today as well as a steroid shot. Your x-ray indicates a likely pneumonia so I have sent over 2 antibiotics, mucinex, an inhaler and an oral steroid. You may get an incentive spirometer to use several times daily to help keep your lungs nice and open and you may also get a pulse oximeter to monitor your oxygen saturations at home. We like to see these stay above 90% at all times. If you worsen at any time, go directly to the ER. Follow up early next week with your Primary Care for a recheck. The Radiologist is also recommending a repeat x-ray in about a month to ensure clearance of infection    ED Prescriptions     Medication Sig Dispense Auth. Provider   predniSONE (DELTASONE) 20 MG tablet Take 2 tablets (40 mg total) by mouth daily with breakfast. 10 tablet Volney American, PA-C   amoxicillin-clavulanate (AUGMENTIN) 875-125 MG tablet Take 1 tablet by mouth every 12 (twelve) hours. 14 tablet Volney American, [place]   azithromycin (ZITHROMAX) 250 MG tablet Take first 2 tablets together, then 1 every day until finished. 6 tablet Volney American, PA-C   guaiFENesin (MUCINEX) 600 MG 12 hr tablet Take 1 tablet (600 mg total) by mouth 2 (two) times daily as needed. 20 tablet Volney American, [place]   albuterol (VENTOLIN HFA) 108 (90 Base) MCG/ACT inhaler Inhale 2 puffs into the lungs every 4 (four) hours as needed for wheezing or shortness of breath. 18 g Volney American, [place]      PDMP not reviewed this encounter.   Volney American, [place] 01/21/23 1115

## 2023-01-21 NOTE — ED Triage Notes (Signed)
Pt reports cough, congetsion, redness in left eye x 3 days. Mucinex gives some relief.

## 2023-01-21 NOTE — Discharge Instructions (Signed)
We have given you a breathing treatment today as well as a steroid shot. Your x-ray indicates a likely pneumonia so I have sent over 2 antibiotics, mucinex, an inhaler and an oral steroid. You may get an incentive spirometer to use several times daily to help keep your lungs nice and open and you may also get a pulse oximeter to monitor your oxygen saturations at home. We like to see these stay above 90% at all times. If you worsen at any time, go directly to the ER. Follow up early next week with your Primary Care for a recheck. The Radiologist is also recommending a repeat x-ray in about a month to ensure clearance of infection

## 2023-04-25 ENCOUNTER — Ambulatory Visit
Admission: EM | Admit: 2023-04-25 | Discharge: 2023-04-25 | Disposition: A | Discharge status: Home / Self Care | Payer: No Typology Code available for payment source | Attending: Nurse Practitioner | Admitting: Nurse Practitioner

## 2023-04-25 ENCOUNTER — Ambulatory Visit (INDEPENDENT_AMBULATORY_CARE_PROVIDER_SITE_OTHER): Payer: No Typology Code available for payment source

## 2023-04-25 ENCOUNTER — Other Ambulatory Visit (HOSPITAL_COMMUNITY): Payer: Self-pay

## 2023-04-25 DIAGNOSIS — R062 Wheezing: Secondary | ICD-10-CM | POA: Insufficient documentation

## 2023-04-25 DIAGNOSIS — J069 Acute upper respiratory infection, unspecified: Secondary | ICD-10-CM | POA: Diagnosis present

## 2023-04-25 DIAGNOSIS — Z1152 Encounter for screening for COVID-19: Secondary | ICD-10-CM | POA: Diagnosis present

## 2023-04-25 MED ORDER — IPRATROPIUM-ALBUTEROL 0.5-2.5 (3) MG/3ML IN SOLN
3.0000 mL | Freq: Once | RESPIRATORY_TRACT | Status: AC
  Administered 2023-04-25: 3 mL via RESPIRATORY_TRACT

## 2023-04-25 MED ORDER — BENZONATATE 100 MG PO CAPS: 100.0000 mg | ORAL_CAPSULE | Freq: Three times a day (TID) | ORAL | 0 refills | Status: DC

## 2023-04-25 MED ORDER — WEGOVY 2.4 MG/0.75ML ~~LOC~~ SOAJ
2.4000 mg | SUBCUTANEOUS | 3 refills | Status: DC
  Filled 2023-04-25: qty 3, 28d supply, fill #0

## 2023-04-25 MED ORDER — ALBUTEROL SULFATE HFA 108 (90 BASE) MCG/ACT IN AERS: 1.0000 | INHALATION_SPRAY | Freq: Four times a day (QID) | RESPIRATORY_TRACT | 0 refills | Status: DC | PRN

## 2023-04-25 MED ORDER — PREDNISONE 20 MG PO TABS: 20.0000 mg | ORAL_TABLET | Freq: Every day | ORAL | 0 refills | Status: AC

## 2023-04-25 NOTE — Discharge Instructions (Signed)
The chest x-ray today does not show any pneumonia.  The pneumonia from 3 months ago appears to have resolved.  Your symptoms are most likely caused by a viral upper respiratory infection.  We have tested you for COVID-19 and we will contact you tomorrow if positive.  In the meantime, start using albuterol inhaler every 4-6 hours around-the-clock for the next 48 hours.  Thereafter, use albuterol inhaler as needed.  Start oral prednisone to help with lung inflammation.  Start plain guaifenesin or Mucinex 600 mg twice daily, increasing oral hydration with water or Pedialyte, and continuing deep breathing exercises.  If you develop a dry/hacky cough, you can use a dextromethorphan or Tessalon Perles as needed.

## 2023-04-25 NOTE — ED Triage Notes (Signed)
Pt states she is having wheezing, cough, congestion that started 2 days ago. Taking robitussin.

## 2023-04-25 NOTE — ED Provider Notes (Signed)
[facility]    CSN: [id number] Arrival date & time: 04/25/23  0920      History   Chief Complaint Chief Complaint  Patient presents with   Cough    HPI [name] is a 62 y.o. female.   Patient presents today for 2 day history of congested cough and "rattling in my chest".  She denies fever, body aches, chills, dry cough, chest pain, chest or nasal congestion, runny nose, headache, ear pain, abdominal pain, nausea/vomiting, and diarrhea.  No known sick contacts, no loss of taste or smell, no new rash.  She endorses shortness of breath with coughing, wheezing, chest tightness, postnasal drainage and scratchy throat, decreased appetite, and fatigue.  Has been taking Robitussin DM without much benefit.  Patient reports approximately 3 months ago, she was diagnosed with pneumonia.  She was prescribed an inhaler at that time, however has never needed inhalers in her life.  She has not used the inhaler for her symptoms so far.  Has not had repeat x-ray to ensure resolution of pneumonia yet.  She does report a smoking history, currently smokes about 1/2 pack/day and has smoked for approximately 20 years.  Reports that she has quit and started smoking again multiple times in her lifetime.  She denies history of asthma as a child, however did have significant allergies that are now resolved.  She is planning to see son and granddaughter this weekend, granddaughter has leukemia and she wants to make sure she is not contagious of anything.    Past Medical History:  Diagnosis Date   Anxiety    Arthritis    Breast cancer (HCC)    Status post bilateral mastectomy   Breast reconstruction deformity    Complication of anesthesia    shaking-after mastectomies-resp and hr dropped   Depression, major    suicidal thoughts -implants, mental health, 05/11   Fibromyalgia    GERD (gastroesophageal reflux disease)    Hypothyroidism    Narcolepsy    OSA on CPAP    PONV  (postoperative nausea and vomiting)    Sleep apnea with use of continuous positive airway pressure (CPAP)    AHI 20 in HST, titration to 6 cm water - residual AHi 6 . remaining hypersomnic.     Patient Active Problem List   Diagnosis Date Noted   Osteoarthritis of left knee 09/15/2022   History of total shoulder replacement, right 02/11/2022   Osteoarthritis of left shoulder 01/13/2017   S/P shoulder replacement, left 01/13/2017   OSA on CPAP 04/01/2015   Subacute confusional state 04/01/2015   Hallucinations, visual 04/01/2015   Hypnapompic hallucinations 04/01/2015   Sleep apnea with use of continuous positive airway pressure (CPAP)    Hypersomnia, persistent 03/30/2013   Obstructive sleep apnea (adult) (pediatric) 03/30/2013   CONSTIPATION 11/18/2008   RECTAL BLEEDING 11/18/2008   HEMORRHOIDS, INTERNAL 11/15/2008   DIVERTICULOSIS, COLON 11/15/2008   CELLULITIS/ABSCESS, TRUNK 02/22/2008   LOW BACK PAIN 02/22/2008   DIZZINESS 02/22/2008   FATIGUE 02/22/2008   PALPITATIONS 02/22/2008   MURMUR, CARDIAC, UNDIAGNOSED 02/22/2008   BREAST CANCER, HX OF 02/22/2008   COUGH 01/05/2008   DEPRESSION 07/05/2007   UTERINE POLYP 07/05/2007   FIBROMYALGIA 07/05/2007   COLONIC POLYPS, HX OF 07/05/2007   HX, PERSONAL, CERVICAL DYSPLASIA 07/05/2007    Past Surgical History:  Procedure Laterality Date   ARTHRODESIS METATARSALPHALANGEAL JOINT (MTPJ) Right 07/28/2017   Procedure: ARTHRODESIS  RIGHT HALLUX METATARSALPHALANGEAL JOINT (MTPJ);  Surgeon: Toni Arthurs, MD;  Location:  [facility];  Service: Orthopedics;  Laterality: Right;   Breast reconstructioin  7/09   flaps-bilat mastectomies   BTL     CARPAL TUNNEL RELEASE  2011   both hands   CARPOMETACARPEL SUSPENSION PLASTY Right 08/29/2014   Procedure: REMOVAL DERMASPAN SUSPENSIONPLASTY RIGHT THUMB ABDUCTOR POLLIS TRANSFER;  Surgeon: Cindee Salt, MD;  Location: [facility];  Service: Orthopedics;  Laterality:  Right;   CATARACT EXTRACTION W/PHACO Right 12/17/2019   Procedure: CATARACT EXTRACTION PHACO AND INTRAOCULAR LENS PLACEMENT (IOC);  Surgeon: Fabio Pierce, MD;  Location: [facility];  Service: Ophthalmology;  Laterality: Right;  CDE: 6.54   CATARACT EXTRACTION W/PHACO Left 12/31/2019   Procedure: CATARACT EXTRACTION PHACO AND INTRAOCULAR LENS PLACEMENT (IOC);  Surgeon: Fabio Pierce, MD;  Location: [facility];  Service: Ophthalmology;  Laterality: Left;  CDE: 3.66   COLONOSCOPY W/ POLYPECTOMY  4/03   tubulovillous adenoma   FINGER ARTHROSCOPY WITH CARPOMETACARPEL (CMC) ARTHROPLASTY Right 06/20/2014   Procedure: ARTHROSCOPY RIGHT THUMB CARPOMETACARPEL GRAFT JACKET INTERPOSITION PARTIAL TRAPEZIECTOMY POSSIBLE OPEN;  Surgeon: Cindee Salt, MD;  Location: [facility];  Service: Orthopedics;  Laterality: Right;   GANGLION CYST EXCISION     both [phone]   HAMMER TOE SURGERY Right 07/28/2017   Procedure: THIRD HAMMERTOE CORRECTION;  Surgeon: Toni Arthurs, MD;  Location: [facility];  Service: Orthopedics;  Laterality: Right;   HARDWARE REMOVAL Right 07/28/2017   Procedure: REMOVAL OF DEEP IMPLANTS;  Surgeon: Toni Arthurs, MD;  Location: [facility];  Service: Orthopedics;  Laterality: Right;   hysterestomy  2005   KNEE ARTHROPLASTY Left 09/15/2022   Procedure: COMPUTER ASSISTED TOTAL KNEE ARTHROPLASTY;  Surgeon: Samson Frederic, MD;  Location: [facility];  Service: Orthopedics;  Laterality: Left;   MASTECTOMY Bilateral 2008   post- a -cath  2008   right knee arthroscopy     TOTAL SHOULDER ARTHROPLASTY Left 01/13/2017   Procedure: LEFT TOTAL SHOULDER ARTHROPLASTY;  Surgeon: Jones Broom, MD;  Location: [facility];  Service: Orthopedics;  Laterality: Left;  LEFT TOTAL SHOULDER ARTHROPLASTY   TOTAL SHOULDER ARTHROPLASTY Right 02/11/2022   Procedure: TOTAL SHOULDER ARTHROPLASTY;  Surgeon: Yolonda Kida, MD;  [facility];  Service: Orthopedics;  Laterality: Right;  150    TRIGGER FINGER RELEASE Right 08/29/2014   Procedure: RELEASE TRIGGER FINGER/A-1 PULLEY;  Surgeon: Cindee Salt, MD;  Location: [facility];  Service: Orthopedics;  Laterality: Right;   wisdom teeth out      OB History   No obstetric history on file.      Home Medications    Prior to Admission medications   Medication Sig Start Date End Date Taking? Authorizing Provider  albuterol (VENTOLIN HFA) 108 (90 Base) MCG/ACT inhaler Inhale 1-2 puffs into the lungs every 6 (six) hours as needed for wheezing or shortness of breath. 04/25/23  Yes Cathlean Marseilles A, NP  ARIPiprazole (ABILIFY) 20 MG tablet Take 20 mg by mouth daily. 07/10/19  Yes [provider]  benzonatate (TESSALON) 100 MG capsule Take 1 capsule (100 mg total) by mouth every 8 (eight) hours. 04/25/23  Yes Cathlean Marseilles A, NP  benztropine (COGENTIN) 1 MG tablet Take 1 mg by mouth at bedtime.  08/25/16  Yes [provider]  busPIRone (BUSPAR) 10 MG tablet Take 10 mg by mouth 3 (three) times daily as needed (anxiety).   Yes [provider]  diclofenac sodium (VOLTAREN) 1 % GEL Apply 1 application topically 4 (four) times daily as needed (pain.).  Yes [provider]  escitalopram (LEXAPRO) 20 MG tablet Take 20 mg by mouth in the morning and at bedtime.   Yes [provider]  Melatonin 5 MG TABS Take 5 mg by mouth at bedtime.   Yes [provider]  methocarbamol (ROBAXIN) 500 MG tablet Take 1 tablet (500 mg total) by mouth 4 (four) times daily. Patient taking differently: Take 500 mg by mouth every 8 (eight) hours as needed for muscle spasms. 02/12/22  Yes McClung, Kevan D, PA  methylphenidate (RITALIN LA) 30 MG 24 hr capsule Take 30 mg by mouth in the morning. 09/06/16  Yes [provider]  NON FORMULARY Pt uses a cpap nightly   Yes [provider]  PERCOCET 5-325 MG tablet Take 1 tablet by mouth 3 (three) times daily as needed.   Yes [provider]  predniSONE (DELTASONE) 20 MG tablet Take 1 tablet (20 mg total) by mouth daily with breakfast for 5 days. 04/25/23 04/30/23 Yes Valentino Nose, NP  pregabalin (LYRICA) 150 MG capsule Take 150 mg by mouth 2 (two) times daily.   Yes [provider]  propranolol ER (INDERAL LA) 120 MG 24 hr capsule Take 120 mg by mouth 2 (two) times daily.  08/08/19  Yes [provider]  Semaglutide-Weight Management (WEGOVY) 0.5 MG/0.5ML SOAJ Inject 0.5 mg into the skin once a week. 08/09/22  Yes   Semaglutide-Weight Management (WEGOVY) 0.5 MG/0.5ML SOAJ Inject 0.5 mg into the skin once a week for 4 weeks 11/24/22  Yes   Semaglutide-Weight Management (WEGOVY) 1 MG/0.5ML SOAJ Inject 1 mg into the skin once a week. 12/27/22  Yes   lidocaine (LIDODERM) 5 % Place 1 patch onto the skin daily. Remove & Discard patch within 12 hours or as directed by MD 10/22/22   Particia Nearing, PA-C  morphine (MS CONTIN) 30 MG 12 hr tablet Take 30 mg by mouth every 12 (twelve) hours.    [provider]  ondansetron (ZOFRAN) 4 MG tablet Take 1 tablet (4 mg total) by mouth every 8 (eight) hours as needed for nausea or vomiting. 09/16/22 09/16/23  Clois Dupes, PA-C    Family History Family History  Problem Relation Age of Onset   Breast cancer Mother    Prostate cancer Father    Breast cancer Maternal Aunt        breast   Colon cancer Maternal Grandmother        age unknown, ? 17's   Breast cancer Maternal Grandmother    Colon cancer Cousin    Healthy Brother    Healthy Son    Healthy Daughter    Healthy Daughter    Leukemia Granddaughter     Social History Social History   Tobacco Use   Smoking status: Former    Packs/day: 0.25    Years: 15.00    Additional pack years: 0.00    Total pack years: 3.75    Types: Cigarettes    Quit date: 06/14/2012    Years since quitting: 10.8   Smokeless tobacco: Never   Tobacco comments:    quit smoking in 1999  Vaping Use   Vaping  Use: Never used  Substance Use Topics   Alcohol use: Not Currently    Comment: glass wine or beer "every once in a while" per pt.   Drug use: No     Allergies   Sulfonamide derivatives   Review of Systems Review of Systems Per HPI  Physical Exam  Triage Vital Signs ED Triage Vitals  Enc Vitals Group     BP 04/25/23 0926 104/65     Pulse Rate 04/25/23 0926 88     Resp 04/25/23 0926 18     Temp 04/25/23 0926 97.8 F (36.6 C)     Temp Source 04/25/23 0926 Oral     SpO2 04/25/23 0926 94 %     Weight --      Height --      Head Circumference --      Peak Flow --      Pain Score 04/25/23 0929 0     Pain Loc --      Pain Edu? --      Excl. in GC? --    No data found.  Updated Vital Signs BP 104/65 (BP Location: Right Arm)   Pulse 80   Temp 97.8 F (36.6 C) (Oral)   Resp 18   SpO2 94%   Visual Acuity Right Eye Distance:   Left Eye Distance:   Bilateral Distance:    Right Eye Near:   Left Eye Near:    Bilateral Near:     Physical Exam Vitals and nursing note reviewed.  Constitutional:      General: She is not in acute distress.    Appearance: Normal appearance. She is not ill-appearing or toxic-appearing.  HENT:     Head: Normocephalic and atraumatic.     Right Ear: Tympanic membrane, ear canal and external ear normal.     Left Ear: Tympanic membrane, ear canal and external ear normal.     Nose: No congestion or rhinorrhea.     Mouth/Throat:     Mouth: Mucous membranes are moist.     Pharynx: Oropharynx is clear. No oropharyngeal exudate or posterior oropharyngeal erythema.  Eyes:     General: No scleral icterus.    Extraocular Movements: Extraocular movements intact.  Cardiovascular:     Rate and Rhythm: Normal rate and regular rhythm.  Pulmonary:     Effort: Pulmonary effort is normal. No respiratory distress.     Breath sounds: Wheezing present. No rhonchi or rales.  Abdominal:     Palpations: Abdomen is soft.  Musculoskeletal:     Cervical  back: Normal range of motion and neck supple.  Lymphadenopathy:     Cervical: No cervical adenopathy.  Skin:    General: Skin is warm and dry.     Coloration: Skin is not jaundiced or pale.     Findings: No erythema or rash.  Neurological:     Mental Status: She is alert and oriented to person, place, and time.  Psychiatric:        Behavior: Behavior is cooperative.      UC Treatments / Results  Labs (all labs ordered are listed, but only abnormal results are displayed) Labs Reviewed  SARS CORONAVIRUS 2 (TAT 6-24 HRS)    EKG   Radiology DG Chest 2 View  Result Date: 04/25/2023 CLINICAL DATA:  Cough, shortness of breath EXAM: CHEST - 2 VIEW COMPARISON:  Previous studies including the examination of 01/21/2023 FINDINGS: Transverse diameter of heart is slightly increased. There are no signs of pulmonary edema or focal pulmonary consolidation. There is no pleural effusion or pneumothorax. There is previous bilateral shoulder arthroplasty. Surgical clips are seen in chest wall on both sides. There is possible left mastectomy. IMPRESSION: There are no signs of pulmonary edema or focal pulmonary consolidation. Electronically Signed   By: Harlan Stains.D.  On: 04/25/2023 09:46    Procedures Procedures (including critical care time)  Medications Ordered in UC Medications  ipratropium-albuterol (DUONEB) 0.5-2.5 (3) MG/3ML nebulizer solution 3 mL (3 mLs Nebulization Given 04/25/23 1001)    Initial Impression / Assessment and Plan / UC Course  I have reviewed the triage vital signs and the nursing notes.  Pertinent labs & imaging results that were available during my care of the patient were reviewed by me and considered in my medical decision making (see chart for details).   Patient is well-appearing, normotensive, afebrile, not tachycardic, not tachypneic, oxygenating well on room air.    1. Viral URI with cough 2. Encounter for screening for COVID-19 3.  Wheezing Suspect viral etiology Vitals and exam today are reassuring COVID-19 testing obtained Chest x-ray is negative for acute cardiopulmonary process DuoNeb given with improvement in subjective chest tightness Continue albuterol inhaler at home every 4-6 hours as needed for wheezing/shortness of breath Start oral prednisone Discussed expectorant, pulmonary hygiene, other supportive care including cough suppressant for dry cough Will defer antibiotic therapy at this time given no history of known COPD, recently treated with Augmentin and Z-Pak 3 months ago for pneumonia with full resolution Return and ER precautions discussed with patient  The patient was given the opportunity to ask questions.  All questions answered to their satisfaction.  The patient is in agreement to this plan.    Final Clinical Impressions(s) / UC Diagnoses   Final diagnoses:  Viral URI with cough  Encounter for screening for COVID-19  Wheezing     Discharge Instructions      The chest x-ray today does not show any pneumonia.  The pneumonia from 3 months ago appears to have resolved.  Your symptoms are most likely caused by a viral upper respiratory infection.  We have tested you for COVID-19 and we will contact you tomorrow if positive.  In the meantime, start using albuterol inhaler every 4-6 hours around-the-clock for the next 48 hours.  Thereafter, use albuterol inhaler as needed.  Start oral prednisone to help with lung inflammation.  Start plain guaifenesin or Mucinex 600 mg twice daily, increasing oral hydration with water or Pedialyte, and continuing deep breathing exercises.  If you develop a dry/hacky cough, you can use a dextromethorphan or Tessalon Perles as needed.     ED Prescriptions     Medication Sig Dispense Auth. Provider   benzonatate (TESSALON) 100 MG capsule Take 1 capsule (100 mg total) by mouth every 8 (eight) hours. 21 capsule Cathlean Marseilles A, NP   albuterol (VENTOLIN HFA)  108 (90 Base) MCG/ACT inhaler Inhale 1-2 puffs into the lungs every 6 (six) hours as needed for wheezing or shortness of breath. 18 g Cathlean Marseilles A, NP   predniSONE (DELTASONE) 20 MG tablet Take 1 tablet (20 mg total) by mouth daily with breakfast for 5 days. 5 tablet Valentino Nose, NP      PDMP not reviewed this encounter.   Valentino Nose, NP 04/25/23 1042

## 2023-04-26 LAB — SARS CORONAVIRUS 2 (TAT 6-24 HRS): SARS Coronavirus 2: NEGATIVE

## 2023-04-28 ENCOUNTER — Other Ambulatory Visit (HOSPITAL_COMMUNITY): Payer: Self-pay

## 2023-06-02 IMAGING — MR MR [PERSON_NAME] LOW W/O CM*R*
5 series · 40 of 40 positions shown · non-contrast
Comparison: None.

CLINICAL DATA: Chronic severe right knee pain radiating down the
lower leg to the ankle for the past 3 years. No injury or prior
surgery. History of breast cancer.

EXAM:
MRI OF LOWER RIGHT EXTREMITY WITHOUT CONTRAST
TECHNIQUE: Multiplanar, multisequence MR imaging of the right lower leg was
performed. No intravenous contrast was administered.

[Series 5: composed cor t1_comp_filt · coronal · right · 6.0mm · 1.25mm/px · 4 of 26 slices shown]
[im 1/26]
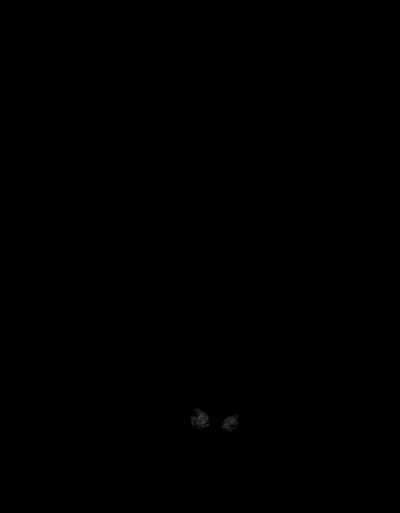
[im 9/26]
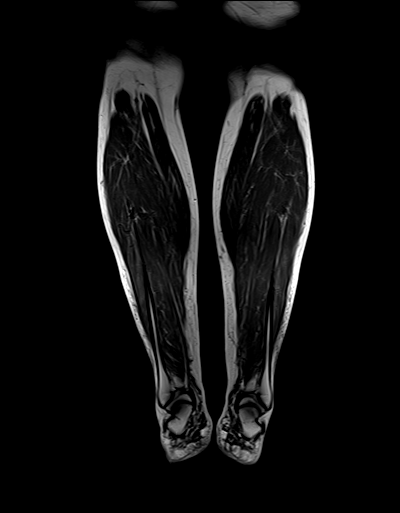
[im 17/26]
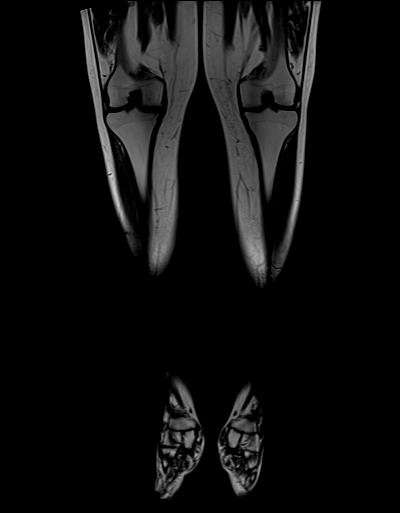
[im 26/26]
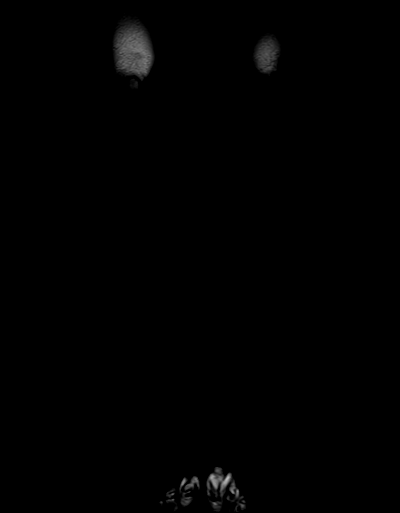

[Series 9: composed cor stir_comp_filt · coronal · right · 6.0mm · 1.25mm/px · 5 of 26 slices shown]
[im 1/26]
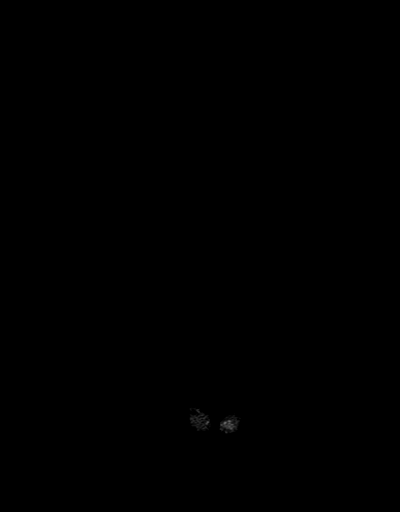
[im 7/26]
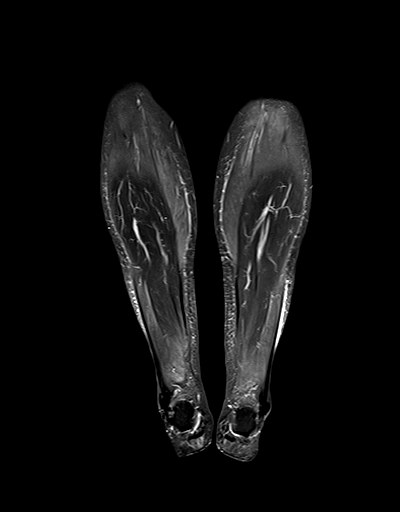
[im 13/26]
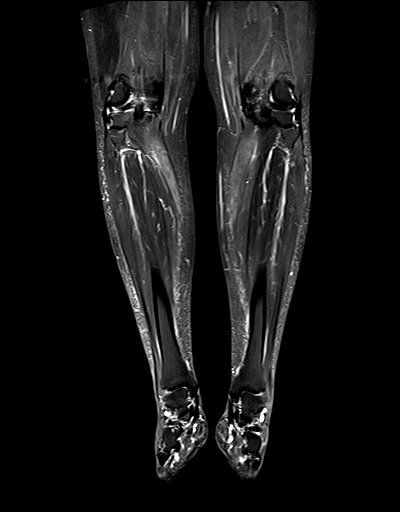
[im 19/26]
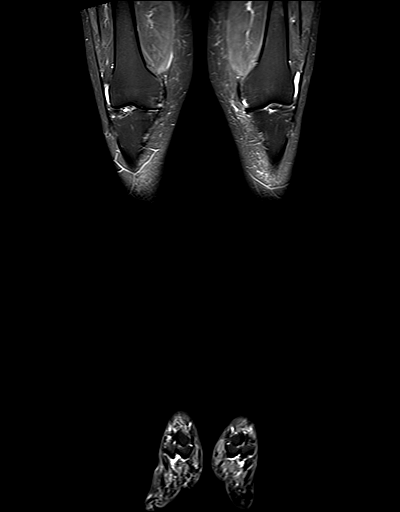
[im 26/26]
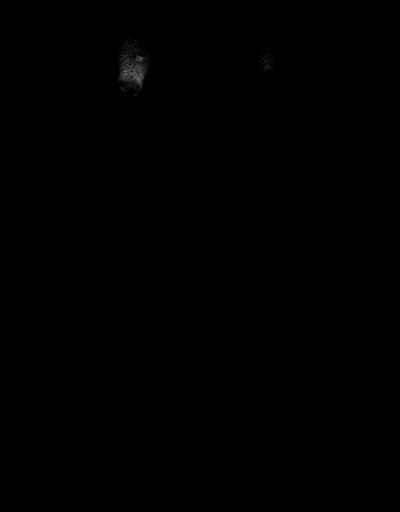

[Series 12: T2 · sagittal · right · 5.0mm · 1.15mm/px · 5 of 28 slices shown (1 of 2)]
[im 1/28]
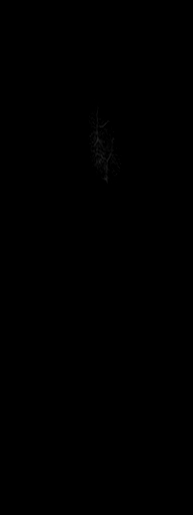
[im 7/28]
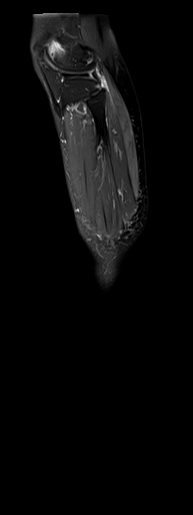
[im 14/28]
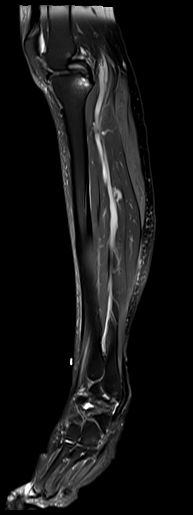
[im 21/28]
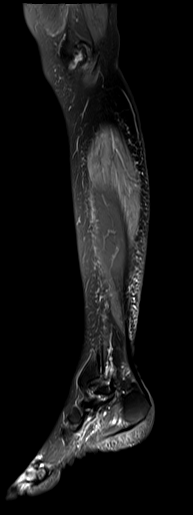
[im 28/28]
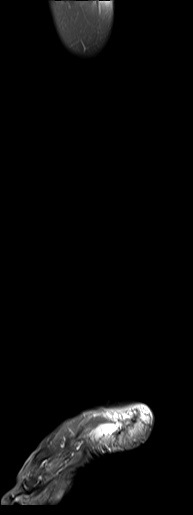

[Series 15: ax t1_comp · axial · right · 5.0mm · 0.78mm/px · z∈[-204,+205]mm · 13 of 70 slices shown]
[im 1/70]
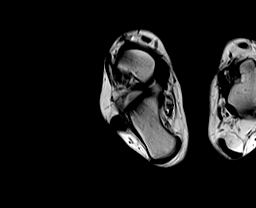
[im 6/70]
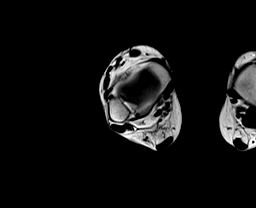
[im 12/70]
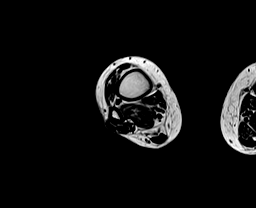
[im 18/70]
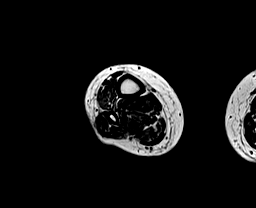
[im 24/70]
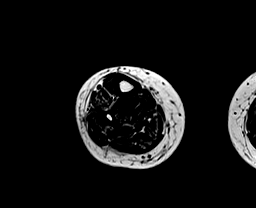
[im 29/70]
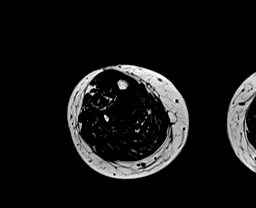
[im 35/70]
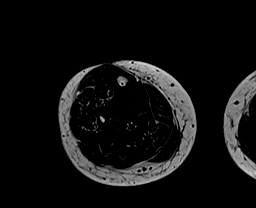
[im 41/70]
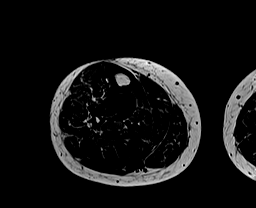
[im 47/70]
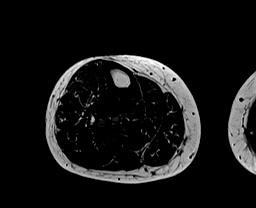
[im 52/70]
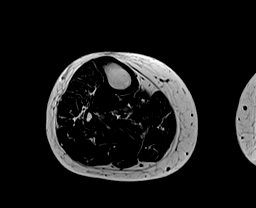
[im 58/70]
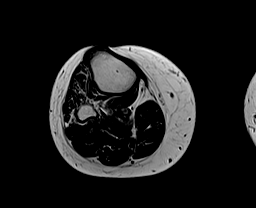
[im 64/70]
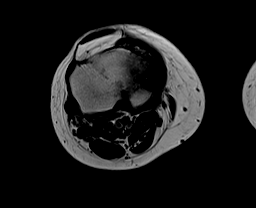
[im 70/70]
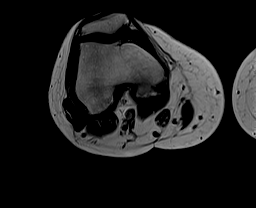

[Series 18: T2 · axial · right · 5.0mm · 0.87mm/px · z∈[-203,+206]mm · 13 of 70 slices shown (2 of 2)]
[im 1/70]
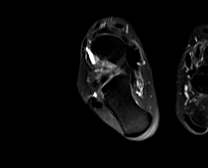
[im 6/70]
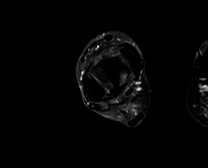
[im 12/70]
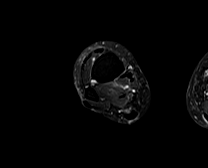
[im 18/70]
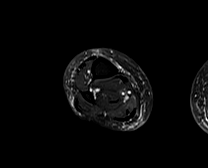
[im 24/70]
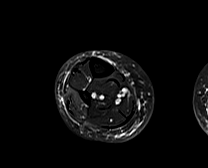
[im 29/70]
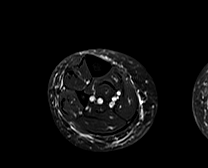
[im 35/70]
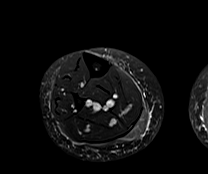
[im 41/70]
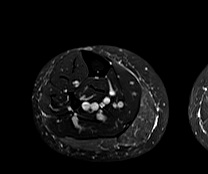
[im 47/70]
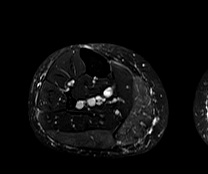
[im 52/70]
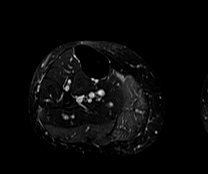
[im 58/70]
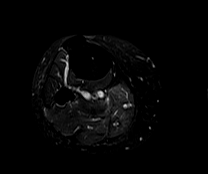
[im 64/70]
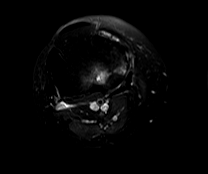
[im 70/70]
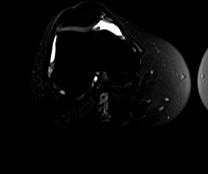

[40 of 40 positions shown; findings below may reference images not displayed]

FINDINGS: Bones/Joint/Cartilage

Advanced medial compartment osteoarthritis the right knee with
full-thickness cartilage loss subchondral marrow edema. Mild
partial-thickness cartilage loss of the left medial compartment. No
fracture or dislocation. Small bilateral knee joint effusions.

Ligaments

Collateral and cruciate ligaments are grossly intact. Blunting of
right medial meniscus mid body.

Muscles and Tendons
The flexor, extensor, peroneal, and Achilles tendons are intact.
Mild diffuse edema involving the medial and gastrocnemius muscles.
No muscle atrophy.

Soft tissue
No fluid collection or hematoma.  No soft tissue mass.
IMPRESSION: 1. Advanced medial compartment osteoarthritis of the right knee.
2. Blunting of the right medial meniscus mid body may be related to
prior partial meniscectomy. Correlate with surgical history.
3. Mild diffuse edema involving the medial and gastrocnemius
muscles, likely related to denervation. No muscle atrophy.

## 2023-06-22 ENCOUNTER — Other Ambulatory Visit (HOSPITAL_COMMUNITY): Payer: Self-pay

## 2023-11-15 ENCOUNTER — Emergency Department (HOSPITAL_COMMUNITY)
Admission: EM | Admit: 2023-11-15 | Discharge: 2023-11-15 | Disposition: A | Discharge status: Home / Self Care | Payer: No Typology Code available for payment source | Attending: Emergency Medicine | Admitting: Emergency Medicine

## 2023-11-15 ENCOUNTER — Emergency Department (HOSPITAL_COMMUNITY): Payer: No Typology Code available for payment source

## 2023-11-15 ENCOUNTER — Encounter (HOSPITAL_COMMUNITY): Payer: Self-pay

## 2023-11-15 DIAGNOSIS — G43009 Migraine without aura, not intractable, without status migrainosus: Secondary | ICD-10-CM | POA: Diagnosis not present

## 2023-11-15 DIAGNOSIS — E039 Hypothyroidism, unspecified: Secondary | ICD-10-CM | POA: Insufficient documentation

## 2023-11-15 DIAGNOSIS — R519 Headache, unspecified: Secondary | ICD-10-CM | POA: Diagnosis present

## 2023-11-15 DIAGNOSIS — Z20822 Contact with and (suspected) exposure to covid-19: Secondary | ICD-10-CM | POA: Diagnosis not present

## 2023-11-15 DIAGNOSIS — Z853 Personal history of malignant neoplasm of breast: Secondary | ICD-10-CM | POA: Diagnosis not present

## 2023-11-15 LAB — CBC WITH DIFFERENTIAL/PLATELET
Abs Immature Granulocytes: 0.02 10*3/uL (ref 0.00–0.07)
Basophils Absolute: 0 10*3/uL (ref 0.0–0.1)
Basophils Relative: 1 %
Eosinophils Absolute: 0.2 10*3/uL (ref 0.0–0.5)
Eosinophils Relative: 4 %
HCT: 40.6 % (ref 36.0–46.0)
Hemoglobin: 12.9 g/dL (ref 12.0–15.0)
Immature Granulocytes: 0 %
Lymphocytes Relative: 14 %
Lymphs Abs: 0.9 10*3/uL (ref 0.7–4.0)
MCH: 27.4 pg (ref 26.0–34.0)
MCHC: 31.8 g/dL (ref 30.0–36.0)
MCV: 86.4 fL (ref 80.0–100.0)
Monocytes Absolute: 0.5 10*3/uL (ref 0.1–1.0)
Monocytes Relative: 8 %
Neutro Abs: 5.1 10*3/uL (ref 1.7–7.7)
Neutrophils Relative %: 73 %
Platelets: 200 10*3/uL (ref 150–400)
RBC: 4.7 MIL/uL (ref 3.87–5.11)
RDW: 14.3 % (ref 11.5–15.5)
WBC: 6.8 10*3/uL (ref 4.0–10.5)
nRBC: 0 % (ref 0.0–0.2)

## 2023-11-15 LAB — RESP PANEL BY RT-PCR (RSV, FLU A&B, COVID)  RVPGX2
Influenza A by PCR: NEGATIVE
Influenza B by PCR: NEGATIVE
Resp Syncytial Virus by PCR: NEGATIVE
SARS Coronavirus 2 by RT PCR: NEGATIVE

## 2023-11-15 LAB — COMPREHENSIVE METABOLIC PANEL
ALT: 19 U/L (ref 0–44)
AST: 20 U/L (ref 15–41)
Albumin: 3.5 g/dL (ref 3.5–5.0)
Alkaline Phosphatase: 65 U/L (ref 38–126)
Anion gap: 5 (ref 5–15)
BUN: 14 mg/dL (ref 8–23)
CO2: 28 mmol/L (ref 22–32)
Calcium: 8.9 mg/dL (ref 8.9–10.3)
Chloride: 102 mmol/L (ref 98–111)
Creatinine, Ser: 0.61 mg/dL (ref 0.44–1.00)
GFR, Estimated: >60 mL/min (ref >60)
Glucose, Bld: 117 mg/dL — ABNORMAL HIGH (ref 70–99)
Potassium: 3.9 mmol/L (ref 3.5–5.1)
Sodium: 135 mmol/L (ref 135–145)
Total Bilirubin: 0.8 mg/dL (ref 0.0–1.2)
Total Protein: 6.7 g/dL (ref 6.5–8.1)

## 2023-11-15 MED ORDER — DIPHENHYDRAMINE HCL 50 MG/ML IJ SOLN
12.5000 mg | Freq: Once | INTRAMUSCULAR | Status: AC
  Administered 2023-11-15: 12.5 mg via INTRAVENOUS
  Filled 2023-11-15: qty 1

## 2023-11-15 MED ORDER — KETOROLAC TROMETHAMINE 15 MG/ML IJ SOLN
15.0000 mg | Freq: Once | INTRAMUSCULAR | Status: AC
  Administered 2023-11-15: 15 mg via INTRAVENOUS
  Filled 2023-11-15: qty 1

## 2023-11-15 MED ORDER — PROCHLORPERAZINE EDISYLATE 10 MG/2ML IJ SOLN
10.0000 mg | Freq: Once | INTRAMUSCULAR | Status: AC
  Administered 2023-11-15: 10 mg via INTRAVENOUS
  Filled 2023-11-15: qty 2

## 2023-11-15 MED ORDER — ACETAMINOPHEN 500 MG PO TABS
1000.0000 mg | ORAL_TABLET | Freq: Once | ORAL | Status: AC
  Administered 2023-11-15: 1000 mg via ORAL
  Filled 2023-11-15: qty 2

## 2023-11-15 NOTE — ED Provider Notes (Signed)
 [place] [facility] AT [facility] Provider Note   CSN: [id number] Arrival date & time: 11/15/23  9178     History  Chief Complaint  Patient presents with   Headache   Nausea    [name] is a 63 y.o. female with history of breast cancer, hypothyroidism, sleep apnea, depression and anxiety, presents with concern for headache ongoing for the past 4 days.  States the headache starts in the back of her head and wraps around the front, involving both sides.  She also has sensitivity to noise and sound.  Reports nausea but no vomiting.  Has taken Goody powder at home without relief of symptoms.  Normally does not get headaches often.  Denies any neck pain or stiffness.   Headache      Home Medications Prior to Admission medications   Medication Sig Start Date End Date Taking? Authorizing Provider  ARIPiprazole  (ABILIFY ) 20 MG tablet Take 20 mg by mouth daily. 07/10/19  Yes [provider]  benztropine  (COGENTIN ) 1 MG tablet Take 1 mg by mouth at bedtime.  08/25/16  Yes [provider]  escitalopram  (LEXAPRO ) 20 MG tablet Take 20 mg by mouth in the morning and at bedtime.   Yes [provider]  Melatonin 5 MG TABS Take 5 mg by mouth at bedtime.   Yes [provider]  methylphenidate  (RITALIN  LA) 30 MG 24 hr capsule Take 30 mg by mouth in the morning. 09/06/16  Yes [provider]  PERCOCET 5-325 MG tablet Take 1 tablet by mouth 3 (three) times daily as needed.   Yes [provider]  pregabalin  (LYRICA ) 150 MG capsule Take 150 mg by mouth 2 (two) times daily.   Yes [provider]  propranolol  ER (INDERAL  LA) 120 MG 24 hr capsule Take 120 mg by mouth 2 (two) times daily.  08/08/19  Yes [provider]  Semaglutide -Weight Management (WEGOVY ) 1 MG/0.5ML SOAJ Inject 1 mg into the skin once a week. 12/27/22  Yes   albuterol  (VENTOLIN  HFA) 108 (90 Base) MCG/ACT inhaler Inhale 1-2 puffs into the  lungs every 6 (six) hours as needed for wheezing or shortness of breath. Patient not taking: Reported on 11/15/2023 04/25/23   Chandra Harlene LABOR, NP  benzonatate  (TESSALON ) 100 MG capsule Take 1 capsule (100 mg total) by mouth every 8 (eight) hours. Patient not taking: Reported on 11/15/2023 04/25/23   Chandra Harlene LABOR, NP  busPIRone  (BUSPAR ) 10 MG tablet Take 10 mg by mouth 3 (three) times daily as needed (anxiety). Patient not taking: Reported on 11/15/2023    [provider]  diclofenac sodium (VOLTAREN) 1 % GEL Apply 1 application topically 4 (four) times daily as needed (pain.).  Patient not taking: Reported on 11/15/2023    [provider]  lidocaine  (LIDODERM ) 5 % Place 1 patch onto the skin daily. Remove & Discard patch within 12 hours or as directed by MD Patient not taking: Reported on 11/15/2023 10/22/22   Stuart Vernell Norris, PA-C  methocarbamol  (ROBAXIN ) 500 MG tablet Take 1 tablet (500 mg total) by mouth 4 (four) times daily. Patient not taking: Reported on 11/15/2023 02/12/22   McClung, Kevan D, PA  morphine  (MS CONTIN ) 30 MG 12 hr tablet Take 30 mg by mouth every 12 (twelve) hours. Patient not taking: Reported on 11/15/2023    [provider]  NON FORMULARY Pt uses a cpap nightly    [provider]  Semaglutide -Weight Management (WEGOVY ) 0.5 MG/0.5ML SOAJ Inject  0.5 mg into the skin once a week. Patient not taking: Reported on 11/15/2023 08/09/22     Semaglutide -Weight Management (WEGOVY ) 0.5 MG/0.5ML SOAJ Inject 0.5 mg into the skin once a week for 4 weeks 11/24/22     Semaglutide -Weight Management (WEGOVY ) 2.4 MG/0.75ML SOAJ Inject 2.4 mg into the skin once a week. 04/25/23         Allergies    Sulfonamide derivatives    Review of Systems   Review of Systems  Neurological:  Positive for headaches.    Physical Exam Updated Vital Signs BP 122/74   Pulse 62   Temp 98.8 F (37.1 C) (Oral)   Ht 5' 4 (1.626 m)   Wt 74.8 kg   SpO2 94%   BMI 28.32  kg/m  Physical Exam Vitals and nursing note reviewed.  Constitutional:      General: She is not in acute distress.    Appearance: She is well-developed.  HENT:     Head: Normocephalic and atraumatic.  Eyes:     Extraocular Movements: Extraocular movements intact.     Conjunctiva/sclera: Conjunctivae normal.     Pupils: Pupils are equal, round, and reactive to light.  Neck:     Comments: Able to touch chin to chest without difficulty or pain Cardiovascular:     Rate and Rhythm: Normal rate and regular rhythm.     Heart sounds: No murmur heard. Pulmonary:     Effort: Pulmonary effort is normal. No respiratory distress.     Breath sounds: Normal breath sounds.  Abdominal:     Palpations: Abdomen is soft.     Tenderness: There is no abdominal tenderness.  Musculoskeletal:        General: No swelling.     Cervical back: Neck supple.  Skin:    General: Skin is warm and dry.     Capillary Refill: Capillary refill takes less than 2 seconds.  Neurological:     General: No focal deficit present.     Mental Status: She is alert.  Psychiatric:        Mood and Affect: Mood normal.     ED Results / Procedures / Treatments   Labs (all labs ordered are listed, but only abnormal results are displayed) Labs Reviewed  COMPREHENSIVE METABOLIC PANEL - Abnormal; Notable for the following components:      Result Value   Glucose, Bld 117 (*)    All other components within normal limits  RESP PANEL BY RT-PCR (RSV, FLU A&B, COVID)  [id number]  CBC WITH DIFFERENTIAL/PLATELET    EKG None  Radiology CT Head Wo Contrast Result Date: 11/15/2023 CLINICAL DATA:  Provided history: Headache, new onset. Additional history provided: Nausea, light sensitivity. EXAM: CT HEAD WITHOUT CONTRAST TECHNIQUE: Contiguous axial images were obtained from the base of the skull through the vertex without intravenous contrast. RADIATION DOSE REDUCTION: This exam was performed according to the departmental  dose-optimization program which includes automated exposure control, adjustment of the mA and/or kV according to patient size and/or use of iterative reconstruction technique. COMPARISON:  Brain MRI 04/16/2015 FINDINGS: Brain: Mild generalized cerebral volume loss. There is no acute intracranial hemorrhage. No demarcated cortical infarct. No extra-axial fluid collection. No evidence of an intracranial mass. No midline shift. Vascular: No hyperdense vessel.  Atherosclerotic calcifications. Skull: No calvarial fracture or aggressive osseous lesion. Sinuses/Orbits: No mass or acute finding within the imaged orbits. No significant paranasal sinus disease at the imaged levels. IMPRESSION: 1.  No evidence of an acute intracranial  abnormality. 2. Mild generalized cerebral atrophy. Electronically Signed   By: Rockey Childs D.O.   On: 11/15/2023 10:42    Procedures Procedures    Medications Ordered in ED Medications  acetaminophen  (TYLENOL ) tablet 1,000 mg (1,000 mg Oral Given 11/15/23 0957)  ketorolac  (TORADOL ) 15 MG/ML injection 15 mg (15 mg Intravenous Given 11/15/23 1108)  prochlorperazine  (COMPAZINE ) injection 10 mg (10 mg Intravenous Given 11/15/23 1108)  diphenhydrAMINE  (BENADRYL ) injection 12.5 mg (12.5 mg Intravenous Given 11/15/23 1108)    ED Course/ Medical Decision Making/ A&P                                 Medical Decision Making Amount and/or Complexity of Data Reviewed Labs: ordered. Radiology: ordered.  Risk OTC drugs. Prescription drug management.     Differential diagnosis includes but is not limited to tension headache, migraine, meningitis, COVID, flu, intracranial mass, intracranial hemorrhage  ED Course:  Patient well-appearing, stable vitals.  Headache ongoing for 4 days without relief from [place] powder at home.  Was given 1000mg  tylenol  upon initial evaluation for her headache. Since she does not have history of headaches, obtained CT head which showed no acute  abnormalities. No concern for meningitis at this time given no recent illnesses, no neck stiffness or pain. No neuro deficits.  Feel this is likely a migraine versus tension headache at this time. CBC with no leukocytosis, CMP unremarkable. COVID, flu, RSV negative Patient given IV Toradol , Compazine , Benadryl  for her headache.  Upon reevaluation, patient eating crackers and drinking water  at bedside.  Patient states her nausea has improved.  Reports her headache pain has come down from a 7 out of 10 to about 4 out of 10 currently.  Patient stable and appropriate for discharge home at this time.  Impression: Migraine  Disposition:  The patient was discharged home with instructions to continue taking Tylenol  and ibuprofen as needed PCP within the next month if she continues to have recurrent headaches for further management. Return precautions given.   Imaging Studies ordered: I ordered imaging studies including CT head I independently visualized the imaging with scope of interpretation limited to determining acute life threatening conditions related to emergency care. Imaging showed no acute abnormalities I agree with the radiologist interpretation                Final Clinical Impression(s) / ED Diagnoses Final diagnoses:  Migraine without aura and without status migrainosus, not intractable    Rx / DC Orders ED Discharge Orders     None         Veta Palma, DEVONNA 11/15/23 1135    Cleotilde Rogue, MD 11/15/23 [place]

## 2023-11-15 NOTE — ED Notes (Signed)
 Pt assisted to bathroom

## 2023-11-15 NOTE — Discharge Instructions (Addendum)
 We checked your blood counts, electrolytes, kidney, and liver function today which is normal.  Your RSV, COVID, flu is negative.  The CT of your head does not show any abnormalities.  You were given medication here to help with your headache today.  You may take up to 1000mg  of tylenol  every 6 hours as needed for pain.  Do not take more then 4g per day.  You may use up to 600mg  ibuprofen every 6 hours as needed for pain.  Do not exceed 2.4g of ibuprofen per day.  If you continue to have headaches, please follow-up with your PCP within the next month for further management.  Return to the ER for any severe headache not controlled with Tylenol  ibuprofen at home, any uncontrolled nausea or vomiting, any other new or concerning symptoms.

## 2023-11-15 NOTE — ED Notes (Signed)
 Pt back from CT

## 2023-11-15 NOTE — ED Triage Notes (Signed)
 Pt c/o headache and nausea x4 days.  Pain score 7/10.  Pt reports taking Goody's w/o relief.  +Light sensitivity

## 2024-02-23 ENCOUNTER — Encounter: Payer: Self-pay | Admitting: Emergency Medicine

## 2024-02-23 ENCOUNTER — Ambulatory Visit
Admission: EM | Admit: 2024-02-23 | Discharge: 2024-02-23 | Disposition: A | Discharge status: Home / Self Care | Attending: Nurse Practitioner | Admitting: Nurse Practitioner

## 2024-02-23 DIAGNOSIS — R062 Wheezing: Secondary | ICD-10-CM

## 2024-02-23 DIAGNOSIS — J209 Acute bronchitis, unspecified: Secondary | ICD-10-CM | POA: Diagnosis not present

## 2024-02-23 DIAGNOSIS — Z87891 Personal history of nicotine dependence: Secondary | ICD-10-CM | POA: Diagnosis not present

## 2024-02-23 LAB — POC COVID19/FLU A&B COMBO
Covid Antigen, POC: NEGATIVE
Influenza A Antigen, POC: NEGATIVE
Influenza B Antigen, POC: NEGATIVE

## 2024-02-23 MED ORDER — ALBUTEROL SULFATE (2.5 MG/3ML) 0.083% IN NEBU: 2.5000 mg | INHALATION_SOLUTION | Freq: Four times a day (QID) | RESPIRATORY_TRACT | 0 refills | Status: DC | PRN

## 2024-02-23 MED ORDER — IPRATROPIUM-ALBUTEROL 0.5-2.5 (3) MG/3ML IN SOLN
3.0000 mL | Freq: Once | RESPIRATORY_TRACT | Status: AC
  Administered 2024-02-23: 3 mL via RESPIRATORY_TRACT

## 2024-02-23 MED ORDER — METHYLPREDNISOLONE SODIUM SUCC 125 MG IJ SOLR
125.0000 mg | Freq: Once | INTRAMUSCULAR | Status: AC
  Administered 2024-02-23: 125 mg via INTRAMUSCULAR

## 2024-02-23 MED ORDER — PROMETHAZINE-DM 6.25-15 MG/5ML PO SYRP: 5.0000 mL | ORAL_SOLUTION | Freq: Four times a day (QID) | ORAL | 0 refills | Status: DC | PRN

## 2024-02-23 MED ORDER — PREDNISONE 20 MG PO TABS: 20.0000 mg | ORAL_TABLET | Freq: Every day | ORAL | 0 refills | Status: AC

## 2024-02-23 NOTE — ED Triage Notes (Signed)
 Cough, wheezing, since Saturday.  Feels fatigued.

## 2024-02-23 NOTE — ED Provider Notes (Signed)
 [facility]    CSN: [id number] Arrival date & time: 02/23/24  1719      History   Chief Complaint No chief complaint on file.   HPI [name] is a 63 y.o. female.   The history is provided by the patient.   Patient presents with a several day history of cough, wheezing, and chest tightness.  Patient denies fever, chills, headache, ear pain, nasal congestion, runny nose, chest pain, abdominal pain, nausea, vomiting, diarrhea, or rash.  Patient denies history of smoking or asthma.  States that she does use a CPAP at night.  She endorses further shortness of breath with coughing, wheezing, chest tightness, postnasal drainage and fatigue.  Reports she has been taking over-the-counter cough and cold medications for symptoms with minimal relief.  Patient denies history of smoking per interview, but per review of chart, patient relayed in a prior note with the same or similar symptoms on 04/25/2023 that she does have a smoking history and currently smokes about half a pack per day at that time.  Patient states that she does have an albuterol inhaler and an albuterol nebulizer machine at home.  Past Medical History:  Diagnosis Date   Anxiety    Arthritis    Breast cancer (HCC)    Status post bilateral mastectomy   Breast reconstruction deformity    Complication of anesthesia    shaking-after mastectomies-resp and hr dropped   Depression, major    suicidal thoughts -implants, mental health, 05/11   Fibromyalgia    GERD (gastroesophageal reflux disease)    Hypothyroidism    Narcolepsy    OSA on CPAP    PONV (postoperative nausea and vomiting)    Sleep apnea with use of continuous positive airway pressure (CPAP)    AHI 20 in HST, titration to 6 cm water - residual AHi 6 . remaining hypersomnic.     Patient Active Problem List   Diagnosis Date Noted   Osteoarthritis of left knee 09/15/2022   History of total shoulder replacement, right 02/11/2022   Osteoarthritis of  left shoulder 01/13/2017   S/P shoulder replacement, left 01/13/2017   OSA on CPAP 04/01/2015   Subacute confusional state 04/01/2015   Hallucinations, visual 04/01/2015   Hypnapompic hallucinations 04/01/2015   Sleep apnea with use of continuous positive airway pressure (CPAP)    Hypersomnia, persistent 03/30/2013   Obstructive sleep apnea (adult) (pediatric) 03/30/2013   Constipation 11/18/2008   RECTAL BLEEDING 11/18/2008   Internal hemorrhoids 11/15/2008   Diverticulosis of colon 11/15/2008   CELLULITIS/ABSCESS, TRUNK 02/22/2008   LOW BACK PAIN 02/22/2008   DIZZINESS 02/22/2008   FATIGUE 02/22/2008   PALPITATIONS 02/22/2008   MURMUR, CARDIAC, UNDIAGNOSED 02/22/2008   BREAST CANCER, HX OF 02/22/2008   COUGH 01/05/2008   DEPRESSION 07/05/2007   UTERINE POLYP 07/05/2007   FIBROMYALGIA 07/05/2007   History of colonic polyps 07/05/2007   HX, PERSONAL, CERVICAL DYSPLASIA 07/05/2007    Past Surgical History:  Procedure Laterality Date   ARTHRODESIS METATARSALPHALANGEAL JOINT (MTPJ) Right 07/28/2017   Procedure: ARTHRODESIS  RIGHT HALLUX METATARSALPHALANGEAL JOINT (MTPJ);  Surgeon: Amada Backer, MD;  Location: [facility];  Service: Orthopedics;  Laterality: Right;   Breast reconstructioin  7/09   flaps-bilat mastectomies   BTL     CARPAL TUNNEL RELEASE  2011   both hands   CARPOMETACARPEL SUSPENSION PLASTY Right 08/29/2014   Procedure: REMOVAL DERMASPAN SUSPENSIONPLASTY RIGHT THUMB ABDUCTOR POLLIS TRANSFER;  Surgeon: Lyanne Sample, MD;  Location: [facility];  Service: Orthopedics;  Laterality: Right;   CATARACT EXTRACTION W/PHACO Right 12/17/2019   Procedure: CATARACT EXTRACTION PHACO AND INTRAOCULAR LENS PLACEMENT (IOC);  Surgeon: Fabio Pierce, MD;  Location: [facility];  Service: Ophthalmology;  Laterality: Right;  CDE: 6.54   CATARACT EXTRACTION W/PHACO Left 12/31/2019   Procedure: CATARACT EXTRACTION PHACO AND INTRAOCULAR LENS PLACEMENT (IOC);  Surgeon:  Fabio Pierce, MD;  Location: [facility];  Service: Ophthalmology;  Laterality: Left;  CDE: 3.66   COLONOSCOPY W/ POLYPECTOMY  4/03   tubulovillous adenoma   FINGER ARTHROSCOPY WITH CARPOMETACARPEL (CMC) ARTHROPLASTY Right 06/20/2014   Procedure: ARTHROSCOPY RIGHT THUMB CARPOMETACARPEL GRAFT JACKET INTERPOSITION PARTIAL TRAPEZIECTOMY POSSIBLE OPEN;  Surgeon: Cindee Salt, MD;  Location: [facility];  Service: Orthopedics;  Laterality: Right;   GANGLION CYST EXCISION     both [phone]   HAMMER TOE SURGERY Right 07/28/2017   Procedure: THIRD HAMMERTOE CORRECTION;  Surgeon: Toni Arthurs, MD;  Location: [facility];  Service: Orthopedics;  Laterality: Right;   HARDWARE REMOVAL Right 07/28/2017   Procedure: REMOVAL OF DEEP IMPLANTS;  Surgeon: Toni Arthurs, MD;  Location: [facility];  Service: Orthopedics;  Laterality: Right;   hysterestomy  2005   KNEE ARTHROPLASTY Left 09/15/2022   Procedure: COMPUTER ASSISTED TOTAL KNEE ARTHROPLASTY;  Surgeon: Samson Frederic, MD;  Location: [facility];  Service: Orthopedics;  Laterality: Left;   MASTECTOMY Bilateral 2008   post- a -cath  2008   right knee arthroscopy     TOTAL SHOULDER ARTHROPLASTY Left 01/13/2017   Procedure: LEFT TOTAL SHOULDER ARTHROPLASTY;  Surgeon: Jones Broom, MD;  Location: [facility];  Service: Orthopedics;  Laterality: Left;  LEFT TOTAL SHOULDER ARTHROPLASTY   TOTAL SHOULDER ARTHROPLASTY Right 02/11/2022   Procedure: TOTAL SHOULDER ARTHROPLASTY;  Surgeon: Yolonda Kida, MD;  Location: [facility];  Service: Orthopedics;  Laterality: Right;  150   TRIGGER FINGER RELEASE Right 08/29/2014   Procedure: RELEASE TRIGGER FINGER/A-1 PULLEY;  Surgeon: Cindee Salt, MD;  Location: [facility];  Service: Orthopedics;  Laterality: Right;   wisdom teeth out      OB History   No obstetric history on file.      Home Medications    Prior to Admission medications   Medication Sig Start Date End Date  Taking? Authorizing Provider  busPIRone (BUSPAR) 10 MG tablet Take 10 mg by mouth 3 (three) times daily as needed (anxiety).   Yes [provider]  methocarbamol (ROBAXIN) 500 MG tablet Take 1 tablet (500 mg total) by mouth 4 (four) times daily. 02/12/22  Yes McClung, Kevan D, PA  albuterol (PROVENTIL) (2.5 MG/3ML) 0.083% nebulizer solution Take 3 mLs (2.5 mg total) by nebulization every 6 (six) hours as needed for wheezing or shortness of breath. 02/23/24  Yes Leath-Warren, Sadie Haber, NP  ARIPiprazole (ABILIFY) 20 MG tablet Take 20 mg by mouth daily. 07/10/19   [provider]  benztropine (COGENTIN) 1 MG tablet Take 1 mg by mouth at bedtime.  08/25/16   [provider]  diclofenac sodium (VOLTAREN) 1 % GEL Apply 1 application topically 4 (four) times daily as needed (pain.).  Patient not taking: Reported on 11/15/2023    [provider]  escitalopram (LEXAPRO) 20 MG tablet Take 20 mg by mouth in the morning and at bedtime.    [provider]  Melatonin 5 MG TABS Take 5 mg by mouth at bedtime.    [provider]  NON FORMULARY Pt uses a cpap nightly  [provider]  PERCOCET 5-325 MG tablet Take 1 tablet by mouth 3 (three) times daily as needed.    [provider]  predniSONE (DELTASONE) 20 MG tablet Take 1 tablet (20 mg total) by mouth daily with breakfast for 7 days. 02/23/24 03/01/24 Yes Leath-Warren, Sadie Haber, NP  pregabalin (LYRICA) 150 MG capsule Take 150 mg by mouth 2 (two) times daily.    [provider]  promethazine-dextromethorphan (PROMETHAZINE-DM) 6.25-15 MG/5ML syrup Take 5 mLs by mouth 4 (four) times daily as needed. 02/23/24  Yes Leath-Warren, Sadie Haber, NP  propranolol ER (INDERAL LA) 120 MG 24 hr capsule Take 120 mg by mouth 2 (two) times daily.  08/08/19   [provider]    Family History Family History  Problem Relation Age of Onset   Breast cancer Mother    Prostate cancer Father     Breast cancer Maternal Aunt        breast   Colon cancer Maternal Grandmother        age unknown, ? 49's   Breast cancer Maternal Grandmother    Colon cancer Cousin    Healthy Brother    Healthy Son    Healthy Daughter    Healthy Daughter    Leukemia Granddaughter     Social History Social History   Tobacco Use   Smoking status: Former    Current packs/day: 0.00    Average packs/day: 0.3 packs/day for 15.0 years (3.8 ttl pk-yrs)    Types: Cigarettes    Start date: 06/14/1997    Quit date: 06/14/2012    Years since quitting: 11.7   Smokeless tobacco: Never   Tobacco comments:    quit smoking in 1999  Vaping Use   Vaping status: Never Used  Substance Use Topics   Alcohol use: Not Currently    Comment: glass wine or beer "every once in a while" per pt.   Drug use: No     Allergies   Sulfonamide derivatives   Review of Systems Review of Systems Per HPI  Physical Exam Triage Vital Signs ED Triage Vitals  Encounter Vitals Group     BP 02/23/24 1730 101/63     Systolic BP Percentile --      Diastolic BP Percentile --      Pulse Rate 02/23/24 1730 69     Resp 02/23/24 1730 18     Temp 02/23/24 1730 97.7 F (36.5 C)     Temp Source 02/23/24 1730 Oral     SpO2 02/23/24 1730 91 %     Weight --      Height --      Head Circumference --      Peak Flow --      Pain Score 02/23/24 1731 3     Pain Loc --      Pain Education --      Exclude from Growth Chart --    No data found.  Updated Vital Signs BP 101/63 (BP Location: Right Arm)   Pulse 70   Temp 97.7 F (36.5 C) (Oral)   Resp 18   SpO2 96%   Visual Acuity Right Eye Distance:   Left Eye Distance:   Bilateral Distance:    Right Eye Near:   Left Eye Near:    Bilateral Near:     Physical Exam Vitals and nursing note reviewed.  Constitutional:      General: She is not in acute distress.    Appearance: Normal appearance.  HENT:     Head: Normocephalic.     Right Ear: Tympanic membrane, ear canal  and external ear normal.     Left Ear: Tympanic membrane, ear canal and external ear normal.     Nose: Congestion present.     Mouth/Throat:     Mouth: Mucous membranes are moist.  Eyes:     Extraocular Movements: Extraocular movements intact.     Conjunctiva/sclera: Conjunctivae normal.     Pupils: Pupils are equal, round, and reactive to light.  Cardiovascular:     Rate and Rhythm: Normal rate and regular rhythm.     Pulses: Normal pulses.     Heart sounds: Normal heart sounds.  Pulmonary:     Effort: Pulmonary effort is normal.     Breath sounds: Examination of the right-upper field reveals wheezing and rhonchi. Examination of the left-upper field reveals wheezing and rhonchi. Examination of the right-lower field reveals wheezing and rhonchi. Examination of the left-lower field reveals wheezing and rhonchi. Wheezing (expiratory wheezing noted throughout) and rhonchi present.  Abdominal:     General: Bowel sounds are normal.     Palpations: Abdomen is soft.     Tenderness: There is no abdominal tenderness.  Musculoskeletal:     Cervical back: Normal range of motion.  Skin:    General: Skin is warm and dry.  Neurological:     General: No focal deficit present.     Mental Status: She is alert and oriented to person, place, and time.  Psychiatric:        Mood and Affect: Mood normal.        Behavior: Behavior normal.      UC Treatments / Results  Labs (all labs ordered are listed, but only abnormal results are displayed) Labs Reviewed  POC COVID19/FLU A&B COMBO - Normal    EKG   Radiology No results found.  Procedures Procedures (including critical care time)  Medications Ordered in UC Medications  ipratropium-albuterol (DUONEB) 0.5-2.5 (3) MG/3ML nebulizer solution 3 mL (3 mLs Nebulization Given 02/23/24 1750)  methylPREDNISolone sodium succinate (SOLU-MEDROL) 125 mg/2 mL injection 125 mg (125 mg Intramuscular Given 02/23/24 1748)    Initial Impression /  Assessment and Plan / UC Course  I have reviewed the triage vital signs and the nursing notes.  Pertinent labs & imaging results that were available during my care of the patient were reviewed by me and considered in my medical decision making (see chart for details).  COVID/flu test was negative.  Chest x-ray is pending.  Will start antibiotic if chest x-ray is positive for pneumonia.  Patient with underlying smoking history.  Suspect patient has developed bronchitis given her 20-year smoking history.  DuoNeb was administered with some improvement of patient's lung sounds, along with Solu-Medrol 125 mg IM.  Will provide treatment with prednisone 20 mg, Promethazine DM for the cough, and albuterol nebulizer solution.  Patient advised to continue use of her albuterol nebulizer as needed.  Also discussed smoking cessation with the patient and options that she can discuss with her PCP.  Patient was given strict ER follow-up precautions.  Patient was in agreement with this plan of care and verbalized understanding.  All questions were answered.  Patient stable for discharge.  Final Clinical Impressions(s) / UC Diagnoses   Final diagnoses:  Wheezing  Viral URI with cough     Discharge Instructions      Go to [facility] for a chest x-ray.  You will need to go  to the emergency department.  Please let them know you are there for a chest x-ray only.  Once the results are received, you will be contacted. You were given an injection of Solu-Medrol 125 mg today along with a nebulizer treatment.  Start the prednisone on 02/24/2024. Take medication as prescribed. Increase fluids and allow for plenty of rest. Recommend use of a humidifier in your bedroom at nighttime during sleep and sleeping elevated on pillows while cough symptoms persist. Recommend smoking cessation at this time.  Please follow-up with your primary care physician to discuss options. Go to the emergency department immediately if  you experience worsening wheezing, shortness of breath, difficulty breathing, or other concerns. Follow-up as needed.     ED Prescriptions     Medication Sig Dispense Auth. Provider   predniSONE (DELTASONE) 20 MG tablet Take 1 tablet (20 mg total) by mouth daily with breakfast for 7 days. 7 tablet Leath-Warren, Belen Bowers, NP   albuterol (PROVENTIL) (2.5 MG/3ML) 0.083% nebulizer solution Take 3 mLs (2.5 mg total) by nebulization every 6 (six) hours as needed for wheezing or shortness of breath. 75 mL Leath-Warren, Belen Bowers, NP   promethazine-dextromethorphan (PROMETHAZINE-DM) 6.25-15 MG/5ML syrup Take 5 mLs by mouth 4 (four) times daily as needed. 118 mL Leath-Warren, Belen Bowers, NP      PDMP not reviewed this encounter.   Hardy Lia, NP 02/23/24 1820

## 2024-02-23 NOTE — Discharge Instructions (Signed)
 Go to [facility] for a chest x-ray.  You will need to go to the emergency department.  Please let them know you are there for a chest x-ray only.  Once the results are received, you will be contacted. You were given an injection of Solu-Medrol 125 mg today along with a nebulizer treatment.  Start the prednisone on 02/24/2024. Take medication as prescribed. Increase fluids and allow for plenty of rest. Recommend use of a humidifier in your bedroom at nighttime during sleep and sleeping elevated on pillows while cough symptoms persist. Recommend smoking cessation at this time.  Please follow-up with your primary care physician to discuss options. Go to the emergency department immediately if you experience worsening wheezing, shortness of breath, difficulty breathing, or other concerns. Follow-up as needed.

## 2024-06-13 ENCOUNTER — Telehealth: Payer: Self-pay

## 2024-06-13 NOTE — Telephone Encounter (Signed)
 Preop clearance appt scheduled, med rec and consent done

## 2024-06-13 NOTE — Telephone Encounter (Signed)
   Name: [name]  DOB: 1961/09/28  MRN: [id number]  Primary Cardiologist: Jayson Sierras, MD  Chart reviewed as part of pre-operative protocol coverage. Because of [name] past medical history and time since last visit, she will require a follow-up in-office visit in order to better assess preoperative cardiovascular risk.  Pre-op covering staff: - Please schedule appointment and call patient to inform them. If patient already had an upcoming appointment within acceptable timeframe, please add pre-op clearance to the appointment notes so provider is aware. - Please contact requesting surgeon's office via preferred method (i.e, phone, fax) to inform them of need for appointment prior to surgery.  No medications indicated as needing held.   [name], PA-C  06/13/2024, 4:39 PM

## 2024-06-13 NOTE — Telephone Encounter (Signed)
   Pre-operative Risk Assessment    Patient Name: [name]  DOB: 30-Jun-1961 MRN: [id number]   Date of last office visit: 07/19/22 Date of next office visit: Not scheduled   Request for Surgical Clearance    Procedure:  Right total knee arthroplasty  Date of Surgery:  Clearance TBD                                Surgeon:  Dr. Campbell Shoals [facility] Group or Practice Name:  [facility] Phone number:  [phone] Fax number:  [phone]   Type of Clearance Requested:   - Medical    Type of Anesthesia:  Spinal   Additional requests/questions:    [name]   06/13/2024, 4:03 PM

## 2024-06-25 ENCOUNTER — Ambulatory Visit (INDEPENDENT_AMBULATORY_CARE_PROVIDER_SITE_OTHER): Admitting: Family

## 2024-06-25 ENCOUNTER — Encounter (HOSPITAL_BASED_OUTPATIENT_CLINIC_OR_DEPARTMENT_OTHER): Payer: Self-pay | Admitting: Family

## 2024-06-25 VITALS — BP 100/70 | HR 65 | Ht 64.0 in | Wt 190.0 lb

## 2024-06-25 DIAGNOSIS — Z01810 Encounter for preprocedural cardiovascular examination: Secondary | ICD-10-CM | POA: Diagnosis not present

## 2024-06-25 DIAGNOSIS — I447 Left bundle-branch block, unspecified: Secondary | ICD-10-CM

## 2024-06-25 DIAGNOSIS — Z6832 Body mass index (BMI) 32.0-32.9, adult: Secondary | ICD-10-CM | POA: Diagnosis not present

## 2024-06-25 DIAGNOSIS — G4733 Obstructive sleep apnea (adult) (pediatric): Secondary | ICD-10-CM

## 2024-06-25 NOTE — Patient Instructions (Signed)
 Medication Instructions:  Continue your current medications.  *If you need a refill on your cardiac medications before your next appointment, please call your pharmacy*  Follow-Up: At [facility], you and your health needs are our priority.  As part of our continuing mission to provide you with exceptional heart care, our providers are all part of one team.  This team includes your primary Cardiologist (physician) and Advanced Practice Providers or APPs (Physician Assistants and Nurse Practitioners) who all work together to provide you with the care you need, when you need it.  Your next appointment:   As needed with cardiology   We recommend signing up for the patient portal called MyChart.  Sign up information is provided on this After Visit Summary.  MyChart is used to connect with patients for Virtual Visits (Telemedicine).  Patients are able to view lab/test results, encounter notes, upcoming appointments, etc.  Non-urgent messages can be sent to your provider as well.   To learn more about what you can do with MyChart, go to [email or web address].   Other Instructions  We referred you to a Registered Dietician today  Your EKG shows LBBB (left bundle branch block) which is stable. Your echocardiogram last year showed your heart muscle worked well and no abnormalities. Just recommend an EKG once per year for monitoring of LBBB.  We will send note to orthopedics you are good to go for surgery!

## 2024-06-25 NOTE — Progress Notes (Unsigned)
 Cardiology Office Note   Date:  06/25/2024  ID:  [name], DOB 26-Jul-1961, MRN [id number] PCP: Loreli Elsie JONETTA Mickey., MD  [facility] Providers Cardiologist:  Jayson Sierras, MD { Click to update primary MD,subspecialty MD or APP then REFRESH:1}    History of Present Illness [name] is a 63 y.o. female with history of left bundle branch block, breast cancer s/p chemotherapy with bilateral mastectomy, OSA on CPAP.    Echo 02/10/2022 LVEF 50-55%, grade 1 diastolic dysfunction, no significant valvular abnormalities.  Presents today for preoperative clearance for right total knee arthroplasty. Hesitations regarding GLP1. Exercising at [facility]. Exercise tolerance >4 METS. Reports no shortness of breath nor dyspnea on exertion. Reports no chest pain, pressure, or tightness. No edema, orthopnea, PND. Reports no palpitations.    ROS: Please see the history of present illness.    All other systems reviewed and are negative.   Studies Reviewed EKG Interpretation Date/Time:  Monday June 25 2024 14:28:45 EDT Ventricular Rate:  65 PR Interval:  162 QRS Duration:  142 QT Interval:  454 QTC Calculation: 472 R Axis:   -3  Text Interpretation: Normal sinus rhythm Left bundle branch block  No acute ST/T wave changes. Confirmed by Vannie Mora ([id number]) on 06/25/2024 2:35:18 PM    Cardiac Studies & Procedures   ______________________________________________________________________________________________     ECHOCARDIOGRAM  ECHOCARDIOGRAM COMPLETE 02/10/2022  Narrative ECHOCARDIOGRAM REPORT    Patient Name:   [name] Date of Exam: 02/10/2022 Medical Rec #:  [id number]       Height:       64.0 in Accession #:    [id number]      Weight:       164.4 lb Date of Birth:  Mar 18, 1961      BSA:          1.800 m Patient Age:    60 years        BP:           92/52 mmHg Patient Gender: F               HR:           73 bpm. Exam Location:  [place]  Procedure: 2D Echo,  Cardiac Doppler and Color Doppler  Indications:    Preoperative cardiovascular examination [Z01.810 (ICD-10-CM)]; LBBB (left bundle branch block) [I44.7 (ICD-10-CM)]  History:        Patient has no prior history of Echocardiogram examinations.  Sonographer:    Charlie Jointer RDCS Referring Phys: [id number] SAMUEL G MCDOWELL   Sonographer Comments: Image acquisition challenging due to mastectomy, Image acquisition challenging due to patient body habitus and Image acquisition challenging due to uncooperative patient. IMPRESSIONS   1. Left ventricular ejection fraction, by estimation, is 50 to 55%. The left ventricle has low normal function. The left ventricle has no regional wall motion abnormalities. Septal motion consistent with left bundle branch block. Left ventricular diastolic parameters are consistent with Grade I diastolic dysfunction (impaired relaxation). 2. Right ventricular systolic function is normal. The right ventricular size is normal. Tricuspid regurgitation signal is inadequate for assessing PA pressure. 3. The mitral valve is grossly normal. No evidence of mitral valve regurgitation. 4. The aortic valve is tricuspid. Aortic valve regurgitation is not visualized. 5. The inferior vena cava is normal in size with greater than 50% respiratory variability, suggesting right atrial pressure of 3 mmHg.  Comparison(s): Prior images unable to be directly viewed. FINDINGS Left Ventricle: Left ventricular ejection fraction,  by estimation, is 50 to 55%. The left ventricle has low normal function. The left ventricle has no regional wall motion abnormalities. The left ventricular internal cavity size was normal in size. There is no left ventricular hypertrophy. Abnormal (paradoxical) septal motion, consistent with left bundle branch block. Left ventricular diastolic parameters are consistent with Grade I diastolic dysfunction (impaired relaxation).  Right Ventricle: The right ventricular  size is normal. No increase in right ventricular wall thickness. Right ventricular systolic function is normal. Tricuspid regurgitation signal is inadequate for assessing PA pressure.  Left Atrium: Left atrial size was normal in size.  Right Atrium: Right atrial size was normal in size.  Pericardium: There is no evidence of pericardial effusion. Presence of epicardial fat layer.  Mitral Valve: The mitral valve is grossly normal. Mild mitral annular calcification. No evidence of mitral valve regurgitation.  Tricuspid Valve: The tricuspid valve is grossly normal. Tricuspid valve regurgitation is trivial.  Aortic Valve: The aortic valve is tricuspid. There is mild aortic valve annular calcification. Aortic valve regurgitation is not visualized.  Pulmonic Valve: The pulmonic valve was grossly normal. Pulmonic valve regurgitation is trivial.  Aorta: The aortic root is normal in size and structure.  Venous: The inferior vena cava is normal in size with greater than 50% respiratory variability, suggesting right atrial pressure of 3 mmHg.  IAS/Shunts: No atrial level shunt detected by color flow Doppler.   LEFT VENTRICLE PLAX 2D LVIDd:         4.87 cm   Diastology LVIDs:         3.33 cm   LV e' medial:    5.11 cm/s LV PW:         0.87 cm   LV E/e' medial:  9.1 LV IVS:        0.86 cm   LV e' lateral:   5.77 cm/s LVOT diam:     2.00 cm   LV E/e' lateral: 8.1 LV SV:         44 LV SV Index:   24 LVOT Area:     3.14 cm   RIGHT VENTRICLE RV S prime:     9.02 cm/s TAPSE (M-mode): 1.9 cm  LEFT ATRIUM         Index LA diam:    4.00 cm 2.22 cm/m AORTIC VALVE             PULMONIC VALVE LVOT Vmax:   66.60 cm/s  PR End Diast Vel: 3.21 msec LVOT Vmean:  48.600 cm/s LVOT VTI:    0.139 m  AORTA Ao Root diam: 3.10 cm Ao Asc diam:  2.90 cm  MITRAL VALVE MV Area (PHT): 5.84 cm    SHUNTS MV Decel Time: 130 msec    Systemic VTI:  0.14 m MV E velocity: 46.70 cm/s  Systemic Diam: 2.00 cm MV  A velocity: 71.60 cm/s MV E/A ratio:  0.65  Jayson Sierras MD Electronically signed by Jayson Sierras MD Signature Date/Time: 02/10/2022/4:15:26 PM    Final          ______________________________________________________________________________________________      Risk Assessment/Calculations           Physical Exam VS:  BP 100/70   Pulse 65   Ht 5' 4 (1.626 m)   Wt 190 lb (86.2 kg)   SpO2 95%   BMI 32.61 kg/m        Wt Readings from Last 3 Encounters:  06/25/24 190 lb (86.2 kg)  11/15/23 165 lb (74.8 kg)  09/15/22 160 lb (72.6 kg)    GEN: Well nourished, well developed in no acute distress NECK: No JVD; No carotid bruits CARDIAC: RRR, no murmurs, rubs, gallops RESPIRATORY:  Clear to auscultation without rales, wheezing or rhonchi  ABDOMEN: Soft, non-tender, non-distended EXTREMITIES:  No edema; No deformity   ASSESSMENT AND PLAN  Preop clearance - According to the Revised Cardiac Risk Index (RCRI), her Perioperative Risk of Major Cardiac Event is (%): 0.4. Her Functional Capacity in METs is: 5.07 according to the [facility] Activity Status Index (DASI). Per AHA/ACC guidelines, she is deemed acceptable risk for the planned procedure without additional cardiovascular testing. Will route to surgical team so they are aware.  Not on antiplatelet nor anticoagulant that would need held.   LBBB - No near syncope, syncope. Monitor with periodic EKG.  Recommended limiting AV nodal blocking agents. Of note on Propranolol  120mg  daily by outside provider, would avoid increasing dose.   OSA - endorses wearing regularly.  CPAP compliance encouraged.        Dispo: follow up in ***  Signed, Reche GORMAN Finder, NP

## 2024-06-26 ENCOUNTER — Ambulatory Visit: Admitting: Skilled Nursing Facility1

## 2024-06-28 ENCOUNTER — Encounter: Admitting: Nutrition

## 2024-07-16 ENCOUNTER — Ambulatory Visit (HOSPITAL_COMMUNITY)

## 2024-07-20 NOTE — Progress Notes (Signed)
 Sent message, via epic in basket, requesting orders in epic from Careers adviser.

## 2024-07-25 ENCOUNTER — Ambulatory Visit: Payer: Self-pay | Admitting: Student

## 2024-07-25 NOTE — H&P (View-Only) (Signed)
 TOTAL KNEE ADMISSION H&P  Patient is being admitted for right total knee arthroplasty.  Subjective:  Chief Complaint:right knee pain.  HPI: [name], 63 y.o. female, has a history of pain and functional disability in the right knee due to arthritis and has failed non-surgical conservative treatments for greater than 12 weeks to includeNSAID's and/or analgesics, corticosteriod injections, flexibility and strengthening excercises, use of assistive devices, weight reduction as appropriate, and activity modification.  Onset of symptoms was gradual, starting 10 years ago with rapidlly worsening course since that time. The patient noted prior procedures on the knee to include  arthroscopy on the right knee(s).  Patient currently rates pain in the right knee(s) at 10 out of 10 with activity. Patient has night pain, worsening of pain with activity and weight bearing, pain that interferes with activities of daily living, pain with passive range of motion, crepitus, and joint swelling.  Patient has evidence of subchondral cysts, subchondral sclerosis, periarticular osteophytes, and joint space narrowing by imaging studies. There is no active infection.  Patient Active Problem List   Diagnosis Date Noted   Osteoarthritis of left knee 09/15/2022   History of total shoulder replacement, right 02/11/2022   Osteoarthritis of left shoulder 01/13/2017   S/P shoulder replacement, left 01/13/2017   OSA on CPAP 04/01/2015   Subacute confusional state 04/01/2015   Hallucinations, visual 04/01/2015   Hypnapompic hallucinations 04/01/2015   Sleep apnea with use of continuous positive airway pressure (CPAP)    Hypersomnia, persistent 03/30/2013   Obstructive sleep apnea (adult) (pediatric) 03/30/2013   Constipation 11/18/2008   RECTAL BLEEDING 11/18/2008   Internal hemorrhoids 11/15/2008   Diverticulosis of colon 11/15/2008   CELLULITIS/ABSCESS, TRUNK 02/22/2008   LOW BACK PAIN 02/22/2008   DIZZINESS  02/22/2008   FATIGUE 02/22/2008   PALPITATIONS 02/22/2008   MURMUR, CARDIAC, UNDIAGNOSED 02/22/2008   BREAST CANCER, HX OF 02/22/2008   COUGH 01/05/2008   DEPRESSION 07/05/2007   UTERINE POLYP 07/05/2007   FIBROMYALGIA 07/05/2007   History of colonic polyps 07/05/2007   HX, PERSONAL, CERVICAL DYSPLASIA 07/05/2007   Past Medical History:  Diagnosis Date   Anxiety    Arthritis    Breast cancer (HCC)    Status post bilateral mastectomy   Breast reconstruction deformity    Complication of anesthesia    shaking-after mastectomies-resp and hr dropped   Depression, major    suicidal thoughts -implants, mental health, 05/11   Dyspnea    Fibromyalgia    GERD (gastroesophageal reflux disease)    Hypothyroidism    Narcolepsy    OSA on CPAP    PONV (postoperative nausea and vomiting)    Sleep apnea with use of continuous positive airway pressure (CPAP)    AHI 20 in HST, titration to 6 cm water  - residual AHi 6 . remaining hypersomnic.     Past Surgical History:  Procedure Laterality Date   ARTHRODESIS METATARSALPHALANGEAL JOINT (MTPJ) Right 07/28/2017   Procedure: ARTHRODESIS  RIGHT HALLUX METATARSALPHALANGEAL JOINT (MTPJ);  Surgeon: Kit Rush, MD;  Location: [facility];  Service: Orthopedics;  Laterality: Right;   Breast reconstructioin  7/09   flaps-bilat mastectomies   BTL     CARPAL TUNNEL RELEASE  2011   both hands   CARPOMETACARPEL SUSPENSION PLASTY Right 08/29/2014   Procedure: REMOVAL DERMASPAN SUSPENSIONPLASTY RIGHT THUMB ABDUCTOR POLLIS TRANSFER;  Surgeon: Arley Curia, MD;  Location: [facility];  Service: Orthopedics;  Laterality: Right;   CATARACT EXTRACTION W/PHACO Right 12/17/2019   Procedure: CATARACT  EXTRACTION PHACO AND INTRAOCULAR LENS PLACEMENT (IOC);  Surgeon: Harrie Agent, MD;  Location: [facility];  Service: Ophthalmology;  Laterality: Right;  CDE: 6.54   CATARACT EXTRACTION W/PHACO Left 12/31/2019   Procedure: CATARACT EXTRACTION  PHACO AND INTRAOCULAR LENS PLACEMENT (IOC);  Surgeon: Harrie Agent, MD;  Location: [facility];  Service: Ophthalmology;  Laterality: Left;  CDE: 3.66   COLONOSCOPY W/ POLYPECTOMY  4/03   tubulovillous adenoma   FINGER ARTHROSCOPY WITH CARPOMETACARPEL (CMC) ARTHROPLASTY Right 06/20/2014   Procedure: ARTHROSCOPY RIGHT THUMB CARPOMETACARPEL GRAFT JACKET INTERPOSITION PARTIAL TRAPEZIECTOMY POSSIBLE OPEN;  Surgeon: Arley Curia, MD;  Location: [facility];  Service: Orthopedics;  Laterality: Right;   GANGLION CYST EXCISION     both [phone]   HAMMER TOE SURGERY Right 07/28/2017   Procedure: THIRD HAMMERTOE CORRECTION;  Surgeon: Kit Rush, MD;  Location: [facility];  Service: Orthopedics;  Laterality: Right;   HARDWARE REMOVAL Right 07/28/2017   Procedure: REMOVAL OF DEEP IMPLANTS;  Surgeon: Kit Rush, MD;  Location: [facility];  Service: Orthopedics;  Laterality: Right;   hysterestomy  2005   KNEE ARTHROPLASTY Left 09/15/2022   Procedure: COMPUTER ASSISTED TOTAL KNEE ARTHROPLASTY;  Surgeon: Fidel Rogue, MD;  Location: [facility];  Service: Orthopedics;  Laterality: Left;   MASTECTOMY Bilateral 2008   post- a -cath  2008   right knee arthroscopy     TOTAL SHOULDER ARTHROPLASTY Left 01/13/2017   Procedure: LEFT TOTAL SHOULDER ARTHROPLASTY;  Surgeon: Eva Herring, MD;  Location: [facility];  Service: Orthopedics;  Laterality: Left;  LEFT TOTAL SHOULDER ARTHROPLASTY   TOTAL SHOULDER ARTHROPLASTY Right 02/11/2022   Procedure: TOTAL SHOULDER ARTHROPLASTY;  Surgeon: Sharl Selinda Dover, MD;  Location: [facility];  Service: Orthopedics;  Laterality: Right;  150   TRIGGER FINGER RELEASE Right 08/29/2014   Procedure: RELEASE TRIGGER FINGER/A-1 PULLEY;  Surgeon: Arley Curia, MD;  Location: [facility];  Service: Orthopedics;  Laterality: Right;   wisdom teeth out      Current Outpatient Medications  Medication Sig Dispense Refill Last Dose/Taking    ARIPiprazole  (ABILIFY ) 20 MG tablet Take 20 mg by mouth in the morning.      B Complex-C (B-COMPLEX WITH VITAMIN C) tablet Take 1 tablet by mouth in the morning.      benztropine  (COGENTIN ) 1 MG tablet Take 1 mg by mouth at bedtime.       busPIRone  (BUSPAR ) 10 MG tablet Take 10 mg by mouth 3 (three) times daily as needed (anxiety). (Patient taking differently: Take 10 mg by mouth 2 (two) times daily.)      diclofenac sodium (VOLTAREN) 1 % GEL Apply 1 application topically 4 (four) times daily as needed (pain.).       escitalopram  (LEXAPRO ) 20 MG tablet Take 20 mg by mouth in the morning and at bedtime.      Melatonin 5 MG TABS Take 5 mg by mouth at bedtime.      methocarbamol  (ROBAXIN ) 500 MG tablet Take 500 mg by mouth 3 (three) times daily as needed for muscle spasms.      NON FORMULARY Pt uses a cpap nightly      PERCOCET 5-325 MG tablet Take 1 tablet by mouth in the morning and at bedtime.      pregabalin  (LYRICA ) 150 MG capsule Take 150 mg by mouth 2 (two) times daily.      propranolol  ER (INDERAL  LA) 120 MG 24 hr capsule Take 120 mg by mouth 2 (two) times daily.  No current facility-administered medications for this visit.   Allergies  Allergen Reactions   Sulfonamide Derivatives Hives and Other (See Comments)    FEVER   Hydrocodone  Nausea And Vomiting    Social History   Tobacco Use   Smoking status: Some Days    Current packs/day: 0.00    Average packs/day: 0.3 packs/day for 15.0 years (3.8 ttl pk-yrs)    Types: Cigarettes    Start date: 06/14/1997    Last attempt to quit: 06/14/2012    Years since quitting: 12.1   Smokeless tobacco: Never   Tobacco comments:    quit smoking in 1999  Substance Use Topics   Alcohol  use: Not Currently    Comment: glass wine or beer every once in a while per pt.    Family History  Problem Relation Age of Onset   Breast cancer Mother    Prostate cancer Father    Breast cancer Maternal Aunt        breast   Colon cancer Maternal  Grandmother        age unknown, ? 33's   Breast cancer Maternal Grandmother    Colon cancer Cousin    Healthy Brother    Healthy Son    Healthy Daughter    Healthy Daughter    Leukemia Granddaughter      Review of Systems  Musculoskeletal:  Positive for arthralgias, gait problem and joint swelling.  All other systems reviewed and are negative.   Objective:  Physical Exam Constitutional:      Appearance: Normal appearance.  HENT:     Head: Normocephalic and atraumatic.     Nose: Nose normal.     Mouth/Throat:     Mouth: Mucous membranes are moist.     Pharynx: Oropharynx is clear.  Eyes:     Conjunctiva/sclera: Conjunctivae normal.  Cardiovascular:     Rate and Rhythm: Normal rate and regular rhythm.     Pulses: Normal pulses.     Heart sounds: Normal heart sounds.  Pulmonary:     Effort: Pulmonary effort is normal.     Breath sounds: Normal breath sounds.  Abdominal:     General: Abdomen is flat.     Palpations: Abdomen is soft.  Genitourinary:    Comments: deferred Musculoskeletal:     Cervical back: Normal range of motion and neck supple.     Comments: Examination the right knee reveals no skin wounds or lesions. She has swelling, trace effusion. No warmth or erythema. Tenderness to palpation medial joint line, lateral joint line, and peripatellar retinacular tissues with a positive grind sign. Range of motion 0-105. No instability. Painless range of motion of the hip.   Sensory and motor function intact in LE bilaterally. Distal pedal pulses 2+ bilaterally.  Mild pedal edema. Calves soft and non-tender.  Skin:    General: Skin is warm and dry.     Capillary Refill: Capillary refill takes less than 2 seconds.  Neurological:     General: No focal deficit present.     Mental Status: She is alert and oriented to person, place, and time.  Psychiatric:        Mood and Affect: Mood normal.        Behavior: Behavior normal.        Thought Content: Thought content  normal.        Judgment: Judgment normal.     Vital signs in last 24 hours: @VSRANGES @  Labs:   Estimated body mass index is  30.04 kg/m as calculated from the following:   Height as of 07/27/24: 5' 4 (1.626 m).   Weight as of 07/27/24: 79.4 kg.   Imaging Review Plain radiographs demonstrate severe degenerative joint disease of the right knee(s). The overall alignment issignificant varus. The bone quality appears to be adequate for age and reported activity level.      Assessment/Plan:  End stage arthritis, right knee   The patient history, physical examination, clinical judgment of the provider and imaging studies are consistent with end stage degenerative joint disease of the right knee(s) and total knee arthroplasty is deemed medically necessary. The treatment options including medical management, injection therapy arthroscopy and arthroplasty were discussed at length. The risks and benefits of total knee arthroplasty were presented and reviewed. The risks due to aseptic loosening, infection, stiffness, patella tracking problems, thromboembolic complications and other imponderables were discussed. The patient acknowledged the explanation, agreed to proceed with the plan and consent was signed. Patient is being admitted for inpatient treatment for surgery, pain control, PT, OT, prophylactic antibiotics, VTE prophylaxis, progressive ambulation and ADL's and discharge planning. The patient is planning to be discharged home with OPPT after an overnight stay.   Therapy Plans: outpatient therapy [facility] 08/06/24.  Disposition: Home with husband Planned DVT Prophylaxis: aspirin  81mg  BID DME needed: Has rolling walker. Will consider ice machine.  PCP: Cleared.  Cardiology: Cleared.  TXA: IV Allergies:  - Sulfa antibiotics - hives, fever Anesthesia Concerns: None. BMI: 32.1 Last HgbA1c: Not diabetic. Other: - OSA, uses CPAP. - Pain management  oxycodone  5mg  BID and lyrica .  - Oxycodone , zofran , methocarbamol .  - Does better with prunes.  - 07/27/24: Hgb 14.0, Cr. 0.85, K+ 4.8. - Mupirocin     Patient's anticipated LOS is less than 2 midnights, meeting these requirements: - Younger than 71 - Lives within 1 hour of care - Has a competent adult at home to recover with post-op recover - NO history of  - Chronic pain requiring opiods  - Diabetes  - Coronary Artery Disease  - Heart failure  - Heart attack  - Stroke  - DVT/VTE  - Cardiac arrhythmia  - Respiratory Failure/COPD  - Renal failure  - Anemia  - Advanced Liver disease

## 2024-07-25 NOTE — H&P (Signed)
 TOTAL KNEE ADMISSION H&P  Patient is being admitted for right total knee arthroplasty.  Subjective:  Chief Complaint:right knee pain.  HPI: [name], 63 y.o. female, has a history of pain and functional disability in the right knee due to arthritis and has failed non-surgical conservative treatments for greater than 12 weeks to includeNSAID's and/or analgesics, corticosteriod injections, flexibility and strengthening excercises, use of assistive devices, weight reduction as appropriate, and activity modification.  Onset of symptoms was gradual, starting 10 years ago with rapidlly worsening course since that time. The patient noted prior procedures on the knee to include  arthroscopy on the right knee(s).  Patient currently rates pain in the right knee(s) at 10 out of 10 with activity. Patient has night pain, worsening of pain with activity and weight bearing, pain that interferes with activities of daily living, pain with passive range of motion, crepitus, and joint swelling.  Patient has evidence of subchondral cysts, subchondral sclerosis, periarticular osteophytes, and joint space narrowing by imaging studies. There is no active infection.  Patient Active Problem List   Diagnosis Date Noted   Osteoarthritis of left knee 09/15/2022   History of total shoulder replacement, right 02/11/2022   Osteoarthritis of left shoulder 01/13/2017   S/P shoulder replacement, left 01/13/2017   OSA on CPAP 04/01/2015   Subacute confusional state 04/01/2015   Hallucinations, visual 04/01/2015   Hypnapompic hallucinations 04/01/2015   Sleep apnea with use of continuous positive airway pressure (CPAP)    Hypersomnia, persistent 03/30/2013   Obstructive sleep apnea (adult) (pediatric) 03/30/2013   Constipation 11/18/2008   RECTAL BLEEDING 11/18/2008   Internal hemorrhoids 11/15/2008   Diverticulosis of colon 11/15/2008   CELLULITIS/ABSCESS, TRUNK 02/22/2008   LOW BACK PAIN 02/22/2008   DIZZINESS  02/22/2008   FATIGUE 02/22/2008   PALPITATIONS 02/22/2008   MURMUR, CARDIAC, UNDIAGNOSED 02/22/2008   BREAST CANCER, HX OF 02/22/2008   COUGH 01/05/2008   DEPRESSION 07/05/2007   UTERINE POLYP 07/05/2007   FIBROMYALGIA 07/05/2007   History of colonic polyps 07/05/2007   HX, PERSONAL, CERVICAL DYSPLASIA 07/05/2007   Past Medical History:  Diagnosis Date   Anxiety    Arthritis    Breast cancer (HCC)    Status post bilateral mastectomy   Breast reconstruction deformity    Complication of anesthesia    shaking-after mastectomies-resp and hr dropped   Depression, major    suicidal thoughts -implants, mental health, 05/11   Fibromyalgia    GERD (gastroesophageal reflux disease)    Hypothyroidism    Narcolepsy    OSA on CPAP    PONV (postoperative nausea and vomiting)    Sleep apnea with use of continuous positive airway pressure (CPAP)    AHI 20 in HST, titration to 6 cm water - residual AHi 6 . remaining hypersomnic.     Past Surgical History:  Procedure Laterality Date   ARTHRODESIS METATARSALPHALANGEAL JOINT (MTPJ) Right 07/28/2017   Procedure: ARTHRODESIS  RIGHT HALLUX METATARSALPHALANGEAL JOINT (MTPJ);  Surgeon: Kit Rush, MD;  Location: [facility];  Service: Orthopedics;  Laterality: Right;   Breast reconstructioin  7/09   flaps-bilat mastectomies   BTL     CARPAL TUNNEL RELEASE  2011   both hands   CARPOMETACARPEL SUSPENSION PLASTY Right 08/29/2014   Procedure: REMOVAL DERMASPAN SUSPENSIONPLASTY RIGHT THUMB ABDUCTOR POLLIS TRANSFER;  Surgeon: Arley Curia, MD;  Location: [facility];  Service: Orthopedics;  Laterality: Right;   CATARACT EXTRACTION W/PHACO Right 12/17/2019   Procedure: CATARACT EXTRACTION PHACO AND INTRAOCULAR  LENS PLACEMENT (IOC);  Surgeon: Harrie Agent, MD;  Location: [facility];  Service: Ophthalmology;  Laterality: Right;  CDE: 6.54   CATARACT EXTRACTION W/PHACO Left 12/31/2019   Procedure: CATARACT EXTRACTION PHACO AND  INTRAOCULAR LENS PLACEMENT (IOC);  Surgeon: Harrie Agent, MD;  Location: [facility];  Service: Ophthalmology;  Laterality: Left;  CDE: 3.66   COLONOSCOPY W/ POLYPECTOMY  4/03   tubulovillous adenoma   FINGER ARTHROSCOPY WITH CARPOMETACARPEL (CMC) ARTHROPLASTY Right 06/20/2014   Procedure: ARTHROSCOPY RIGHT THUMB CARPOMETACARPEL GRAFT JACKET INTERPOSITION PARTIAL TRAPEZIECTOMY POSSIBLE OPEN;  Surgeon: Arley Curia, MD;  Location: [facility];  Service: Orthopedics;  Laterality: Right;   GANGLION CYST EXCISION     both [phone]   HAMMER TOE SURGERY Right 07/28/2017   Procedure: THIRD HAMMERTOE CORRECTION;  Surgeon: Kit Rush, MD;  Location: [facility];  Service: Orthopedics;  Laterality: Right;   HARDWARE REMOVAL Right 07/28/2017   Procedure: REMOVAL OF DEEP IMPLANTS;  Surgeon: Kit Rush, MD;  Location: [facility];  Service: Orthopedics;  Laterality: Right;   hysterestomy  2005   KNEE ARTHROPLASTY Left 09/15/2022   Procedure: COMPUTER ASSISTED TOTAL KNEE ARTHROPLASTY;  Surgeon: Fidel Rogue, MD;  Location: [facility];  Service: Orthopedics;  Laterality: Left;   MASTECTOMY Bilateral 2008   post- a -cath  2008   right knee arthroscopy     TOTAL SHOULDER ARTHROPLASTY Left 01/13/2017   Procedure: LEFT TOTAL SHOULDER ARTHROPLASTY;  Surgeon: Eva Herring, MD;  Location: [facility];  Service: Orthopedics;  Laterality: Left;  LEFT TOTAL SHOULDER ARTHROPLASTY   TOTAL SHOULDER ARTHROPLASTY Right 02/11/2022   Procedure: TOTAL SHOULDER ARTHROPLASTY;  Surgeon: Sharl Selinda Dover, MD;  Location: [facility];  Service: Orthopedics;  Laterality: Right;  150   TRIGGER FINGER RELEASE Right 08/29/2014   Procedure: RELEASE TRIGGER FINGER/A-1 PULLEY;  Surgeon: Arley Curia, MD;  Location:  SUR[facility]ERY [facility];  Service: Orthopedics;  Laterality: Right;   wisdom teeth out      Current Outpatient Medications  Medication Sig Dispense Refill Last Dose/Taking   ARIPiprazole   (ABILIFY ) 20 MG tablet Take 20 mg by mouth in the morning.      B Complex-C (B-COMPLEX WITH VITAMIN C) tablet Take 1 tablet by mouth in the morning.      benztropine  (COGENTIN ) 1 MG tablet Take 1 mg by mouth at bedtime.       busPIRone  (BUSPAR ) 10 MG tablet Take 10 mg by mouth 3 (three) times daily as needed (anxiety). (Patient taking differently: Take 10 mg by mouth 2 (two) times daily.)      diclofenac sodium (VOLTAREN) 1 % GEL Apply 1 application topically 4 (four) times daily as needed (pain.).       escitalopram  (LEXAPRO ) 20 MG tablet Take 20 mg by mouth in the morning and at bedtime.      Melatonin 5 MG TABS Take 5 mg by mouth at bedtime.      methocarbamol  (ROBAXIN ) 500 MG tablet Take 500 mg by mouth 3 (three) times daily as needed for muscle spasms.      NON FORMULARY Pt uses a cpap nightly      PERCOCET 5-325 MG tablet Take 1 tablet by mouth in the morning and at bedtime.      pregabalin  (LYRICA ) 150 MG capsule Take 150 mg by mouth 2 (two) times daily.      propranolol  ER (INDERAL  LA) 120 MG 24 hr capsule Take 120 mg by mouth 2 (two) times daily.  No current facility-administered medications for this visit.   Allergies  Allergen Reactions   Sulfonamide Derivatives Hives and Other (See Comments)    FEVER   Hydrocodone  Nausea And Vomiting    Social History   Tobacco Use   Smoking status: Former    Current packs/day: 0.00    Average packs/day: 0.3 packs/day for 15.0 years (3.8 ttl pk-yrs)    Types: Cigarettes    Start date: 06/14/1997    Quit date: 06/14/2012    Years since quitting: 12.1   Smokeless tobacco: Never   Tobacco comments:    quit smoking in 1999  Substance Use Topics   Alcohol  use: Not Currently    Comment: glass wine or beer every once in a while per pt.    Family History  Problem Relation Age of Onset   Breast cancer Mother    Prostate cancer Father    Breast cancer Maternal Aunt        breast   Colon cancer Maternal Grandmother        age unknown,  ? 13's   Breast cancer Maternal Grandmother    Colon cancer Cousin    Healthy Brother    Healthy Son    Healthy Daughter    Healthy Daughter    Leukemia Granddaughter      Review of Systems  Musculoskeletal:  Positive for arthralgias, gait problem and joint swelling.  All other systems reviewed and are negative.   Objective:  Physical Exam Constitutional:      Appearance: Normal appearance.  HENT:     Head: Normocephalic and atraumatic.     Nose: Nose normal.     Mouth/Throat:     Mouth: Mucous membranes are moist.     Pharynx: Oropharynx is clear.  Eyes:     Conjunctiva/sclera: Conjunctivae normal.  Cardiovascular:     Rate and Rhythm: Normal rate and regular rhythm.     Pulses: Normal pulses.     Heart sounds: Normal heart sounds.  Pulmonary:     Effort: Pulmonary effort is normal.     Breath sounds: Normal breath sounds.  Abdominal:     General: Abdomen is flat.     Palpations: Abdomen is soft.  Genitourinary:    Comments: deferred Musculoskeletal:     Cervical back: Normal range of motion and neck supple.     Comments: Examination the right knee reveals no skin wounds or lesions. She has swelling, trace effusion. No warmth or erythema. Tenderness to palpation medial joint line, lateral joint line, and peripatellar retinacular tissues with a positive grind sign. Range of motion 0-105. No instability. Painless range of motion of the hip.   Sensory and motor function intact in LE bilaterally. Distal pedal pulses 2+ bilaterally.  Mild pedal edema. Calves soft and non-tender.  Skin:    General: Skin is warm and dry.     Capillary Refill: Capillary refill takes less than 2 seconds.  Neurological:     General: No focal deficit present.     Mental Status: She is alert and oriented to person, place, and time.  Psychiatric:        Mood and Affect: Mood normal.        Behavior: Behavior normal.        Thought Content: Thought content normal.        Judgment:  Judgment normal.     Vital signs in last 24 hours: @VSRANGES @  Labs:   Estimated body mass index is 32.61 kg/m as  calculated from the following:   Height as of 06/25/24: 5' 4 (1.626 m).   Weight as of 06/25/24: 86.2 kg.   Imaging Review Plain radiographs demonstrate severe degenerative joint disease of the right knee(s). The overall alignment issignificant varus. The bone quality appears to be adequate for age and reported activity level.      Assessment/Plan:  End stage arthritis, right knee   The patient history, physical examination, clinical judgment of the provider and imaging studies are consistent with end stage degenerative joint disease of the right knee(s) and total knee arthroplasty is deemed medically necessary. The treatment options including medical management, injection therapy arthroscopy and arthroplasty were discussed at length. The risks and benefits of total knee arthroplasty were presented and reviewed. The risks due to aseptic loosening, infection, stiffness, patella tracking problems, thromboembolic complications and other imponderables were discussed. The patient acknowledged the explanation, agreed to proceed with the plan and consent was signed. Patient is being admitted for inpatient treatment for surgery, pain control, PT, OT, prophylactic antibiotics, VTE prophylaxis, progressive ambulation and ADL's and discharge planning. The patient is planning to be discharged home with OPPT after an overnight stay.   Therapy Plans: outpatient therapy [facility] 08/06/24.  Disposition: Home with husband Planned DVT Prophylaxis: aspirin  81mg  BID DME needed: Has rolling walker. Will consider ice machine.  PCP: Cleared.  Cardiology: Cleared.  TXA: IV Allergies:  - Sulfa antibiotics - hives, fever Anesthesia Concerns: None. BMI: 32.1 Last HgbA1c: Not diabetic. Other: - OSA, uses CPAP. - Pain management oxycodone  5mg  BID and lyrica .  -  Oxycodone , zofran , methocarbamol .  - Does better with prunes.  - Labs pending.    Patient's anticipated LOS is less than 2 midnights, meeting these requirements: - Younger than 12 - Lives within 1 hour of care - Has a competent adult at home to recover with post-op recover - NO history of  - Chronic pain requiring opiods  - Diabetes  - Coronary Artery Disease  - Heart failure  - Heart attack  - Stroke  - DVT/VTE  - Cardiac arrhythmia  - Respiratory Failure/COPD  - Renal failure  - Anemia  - Advanced Liver disease

## 2024-07-25 NOTE — Progress Notes (Signed)
Second request for pre op orders sent to [facility].

## 2024-07-26 NOTE — Progress Notes (Signed)
 COVID Vaccine received:  []  No [x]  Yes Date of any COVID positive Test in last 90 days: no PCP - Dr. Elsie Gentry Cardiologist - Dr. Jayson Sierras  Chest x-ray -  EKG -  06/25/24 Epic Stress Test -  ECHO - 02/10/22 Epic Cardiac Cath -   Cardiac clearance-06/25/24- Reche Finder NP  Bowel Prep - [x]  No  []   Yes ______  Pacemaker / ICD device [x]  No []  Yes   Spinal Cord Stimulator:[x]  No []  Yes       History of Sleep Apnea? []  No [x]  Yes   CPAP used?- []  No [x]  Yes    Does the patient monitor blood sugar?          [x]  No []  Yes  []  N/A  Patient has: [x]  NO Hx DM   []  Pre-DM                 []  DM1  []   DM2 Does patient have a [employer] or Dexacom? []  No []  Yes   Fasting Blood Sugar Ranges-  Checks Blood Sugar _____ times a day  GLP1 agonist / usual dose - no GLP1 instructions:  SGLT-2 inhibitors / usual dose - no SGLT-2 instructions:   Blood Thinner / Instructions:no Aspirin  Instructions:no  Comments:   Activity level: Patient is  unable to climb a flight of stairs without difficulty; [x]  No CP  [x]  No SOB, but would have _knee pain__   Patient can  perform ADLs without assistance.   Anesthesia review:   Patient denies shortness of breath, fever, cough and chest pain at PAT appointment.  Patient verbalized understanding and agreement to the Pre-Surgical Instructions that were given to them at this PAT appointment. Patient was also educated of the need to review these PAT instructions again prior to his/her surgery.I reviewed the appropriate phone numbers to call if they have any and questions or concerns.

## 2024-07-26 NOTE — Patient Instructions (Signed)
 SURGICAL WAITING ROOM VISITATION  Patients having surgery or a procedure may have no more than 2 support people in the waiting area - these visitors may rotate.    Children under the age of 58 must have an adult with them who is not the patient.  Visitors with respiratory illnesses are discouraged from visiting and should remain at home.  If the patient needs to stay at the hospital during part of their recovery, the visitor guidelines for inpatient rooms apply. Pre-op nurse will coordinate an appropriate time for 1 support person to accompany patient in pre-op.  This support person may not rotate.    Please refer to the [facility] website for the visitor guidelines for Inpatients (after your surgery is over and you are in a regular room).       Your procedure is scheduled on: 08/02/24   Report to [facility] Main Entrance    Report to admitting at 7 AM   Call this number if you have problems the morning of surgery [phone]   Do not eat food :After Midnight.   After Midnight you may have the following liquids until  6:30 AM DAY OF SURGERY  Water Non-Citrus Juices (without pulp, NO RED-Apple, White grape, White cranberry) Black Coffee (NO MILK/CREAM OR CREAMERS, sugar ok)  Clear Tea (NO MILK/CREAM OR CREAMERS, sugar ok) regular and decaf                             Plain Jell-O (NO RED)                                           Fruit ices (not with fruit pulp, NO RED)                                     Popsicles (NO RED)                                                               Sports drinks like Gatorade (NO RED)              The day of surgery:  Drink ONE (1) Pre-Surgery Clear Ensure at 6:30 AM the morning of surgery. Drink in one sitting. Do not sip.  This drink was given to you during your hospital  pre-op appointment visit. Nothing else to drink after completing the  Pre-Surgery Clear Ensure .    Oral Hygiene is also important to reduce your risk of  infection.                                    Remember - BRUSH YOUR TEETH THE MORNING OF SURGERY WITH YOUR REGULAR TOOTHPASTE   Stop all vitamins and herbal supplements 7 days before surgery.   Take these medicines the morning of surgery with A SIP OF WATER: Abilify , Cogentin , Buspar , Escitalopram , Pregabalin , Propanolol.  Bring CPAP mask and tubing day of surgery.  You may not have any metal on your body including hair pins, jewelry, and body piercing             Do not wear make-up, lotions, powders, perfumes/cologne, or deodorant  Do not wear nail polish including gel and S&S, artificial/acrylic nails, or any other type of covering on natural nails including finger and toenails. If you have artificial nails, gel coating, etc. that needs to be removed by a nail salon please have this removed prior to surgery or surgery may need to be canceled/ delayed if the surgeon/ anesthesia feels like they are unable to be safely monitored.   Do not shave  48 hours prior to surgery.    Do not bring valuables to the hospital. [facility] IS NOT             RESPONSIBLE   FOR VALUABLES.   Contacts, glasses, dentures or bridgework may not be worn into surgery.   Bring small overnight bag day of surgery.   DO NOT BRING YOUR HOME MEDICATIONS TO THE HOSPITAL. PHARMACY WILL DISPENSE MEDICATIONS LISTED ON YOUR MEDICATION LIST TO YOU DURING YOUR ADMISSION IN THE HOSPITAL!    Patients discharged on the day of surgery will not be allowed to drive home.  Someone NEEDS to stay with you for the first 24 hours after anesthesia.   Special Instructions: Bring a copy of your healthcare power of attorney and living will documents the day of surgery if you haven't scanned them before.              Please read over the following fact sheets you were given: IF YOU HAVE QUESTIONS ABOUT YOUR PRE-OP INSTRUCTIONS PLEASE CALL [phone] [place]   If you received a COVID test during your pre-op  visit  it is requested that you wear a mask when out in public, stay away from anyone that may not be feeling well and notify your surgeon if you develop symptoms. If you test positive for Covid or have been in contact with anyone that has tested positive in the last 10 days please notify you surgeon.      Pre-operative 5 CHG Bath Instructions   You can play a key role in reducing the risk of infection after surgery. Your skin needs to be as free of germs as possible. You can reduce the number of germs on your skin by washing with CHG (chlorhexidine  gluconate) soap before surgery. CHG is an antiseptic soap that kills germs and continues to kill germs even after washing.   DO NOT use if you have an allergy to chlorhexidine /CHG or antibacterial soaps. If your skin becomes reddened or irritated, stop using the CHG and notify one of our RNs at [phone].   Please shower with the CHG soap starting 4 days before surgery using the following schedule:     Please keep in mind the following:  DO NOT shave, including legs and underarms, starting the day of your first shower.   You may shave your face at any point before/day of surgery.  Place clean sheets on your bed the day you start using CHG soap. Use a clean washcloth (not used since being washed) for each shower. DO NOT sleep with pets once you start using the CHG.   CHG Shower Instructions:  If you choose to wash your hair and private area, wash first with your normal shampoo/soap.  After you use shampoo/soap, rinse your hair and body thoroughly to remove shampoo/soap residue.  Turn the  water OFF and apply about 3 tablespoons (45 ml) of CHG soap to a CLEAN washcloth.  Apply CHG soap ONLY FROM YOUR NECK DOWN TO YOUR TOES (washing for 3-5 minutes)  DO NOT use CHG soap on face, private areas, open wounds, or sores.  Pay special attention to the area where your surgery is being performed.  If you are having back surgery, having someone wash  your back for you may be helpful. Wait 2 minutes after CHG soap is applied, then you may rinse off the CHG soap.  Pat dry with a clean towel  Put on clean clothes/pajamas   If you choose to wear lotion, please use ONLY the CHG-compatible lotions on the back of this paper.     Additional instructions for the day of surgery: DO NOT APPLY any lotions, deodorants, cologne, or perfumes.   Put on clean/comfortable clothes.  Brush your teeth.  Ask your nurse before applying any prescription medications to the skin.      CHG Compatible Lotions   Aveeno Moisturizing lotion  Cetaphil Moisturizing Cream  Cetaphil Moisturizing Lotion  Clairol Herbal Essence Moisturizing Lotion, Dry Skin  Clairol Herbal Essence Moisturizing Lotion, Extra Dry Skin  Clairol Herbal Essence Moisturizing Lotion, Normal Skin  Curel Age Defying Therapeutic Moisturizing Lotion with Alpha Hydroxy  Curel Extreme Care Body Lotion  Curel Soothing Hands Moisturizing Hand Lotion  Curel Therapeutic Moisturizing Cream, Fragrance-Free  Curel Therapeutic Moisturizing Lotion, Fragrance-Free  Curel Therapeutic Moisturizing Lotion, Original Formula  Eucerin Daily Replenishing Lotion  Eucerin Dry Skin Therapy Plus Alpha Hydroxy Crme  Eucerin Dry Skin Therapy Plus Alpha Hydroxy Lotion  Eucerin Original Crme  Eucerin Original Lotion  Eucerin Plus Crme Eucerin Plus Lotion  Eucerin TriLipid Replenishing Lotion  Keri Anti-Bacterial Hand Lotion  Keri Deep Conditioning Original Lotion Dry Skin Formula Softly Scented  Keri Deep Conditioning Original Lotion, Fragrance Free Sensitive Skin Formula  Keri Lotion Fast Absorbing Fragrance Free Sensitive Skin Formula  Keri Lotion Fast Absorbing Softly Scented Dry Skin Formula  Keri Original Lotion  Keri Skin Renewal Lotion Keri Silky Smooth Lotion  Keri Silky Smooth Sensitive Skin Lotion  Nivea Body Creamy Conditioning Oil  Nivea Body Extra Enriched Lotion  Nivea Body Original  Lotion  Nivea Body Sheer Moisturizing Lotion Nivea Crme  Nivea Skin Firming Lotion  NutraDerm 30 Skin Lotion  NutraDerm Skin Lotion  NutraDerm Therapeutic Skin Cream  NutraDerm Therapeutic Skin Lotion  ProShield Protective Hand Cream  Incentive Spirometer  An incentive spirometer is a tool that can help keep your lungs clear and active. This tool measures how well you are filling your lungs with each breath. Taking long deep breaths may help reverse or decrease the chance of developing breathing (pulmonary) problems (especially infection) following: A long period of time when you are unable to move or be active. BEFORE THE PROCEDURE  If the spirometer includes an indicator to show your best effort, your nurse or respiratory therapist will set it to a desired goal. If possible, sit up straight or lean slightly forward. Try not to slouch. Hold the incentive spirometer in an upright position. INSTRUCTIONS FOR USE  Sit on the edge of your bed if possible, or sit up as far as you can in bed or on a chair. Hold the incentive spirometer in an upright position. Breathe out normally. Place the mouthpiece in your mouth and seal your lips tightly around it. Breathe in slowly and as deeply as possible, raising the piston or the ball toward the  top of the column. Hold your breath for 3-5 seconds or for as long as possible. Allow the piston or ball to fall to the bottom of the column. Remove the mouthpiece from your mouth and breathe out normally. Rest for a few seconds and repeat Steps 1 through 7 at least 10 times every 1-2 hours when you are awake. Take your time and take a few normal breaths between deep breaths. The spirometer may include an indicator to show your best effort. Use the indicator as a goal to work toward during each repetition. After each set of 10 deep breaths, practice coughing to be sure your lungs are clear. If you have an incision (the cut made at the time of surgery), support  your incision when coughing by placing a pillow or rolled up towels firmly against it. Once you are able to get out of bed, walk around indoors and cough well. You may stop using the incentive spirometer when instructed by your caregiver.  RISKS AND COMPLICATIONS Take your time so you do not get dizzy or light-headed. If you are in pain, you may need to take or ask for pain medication before doing incentive spirometry. It is harder to take a deep breath if you are having pain. AFTER USE Rest and breathe slowly and easily. It can be helpful to keep track of a log of your progress. Your caregiver can provide you with a simple table to help with this. If you are using the spirometer at home, follow these instructions: SEEK MEDICAL CARE IF:  You are having difficultly using the spirometer. You have trouble using the spirometer as often as instructed. Your pain medication is not giving enough relief while using the spirometer. You develop fever of 100.5 F (38.1 C) or higher. SEEK IMMEDIATE MEDICAL CARE IF:  You cough up bloody sputum that had not been present before. You develop fever of 102 F (38.9 C) or greater. You develop worsening pain at or near the incision site. MAKE SURE YOU:  Understand these instructions. Will watch your condition. Will get help right away if you are not doing well or get worse. Document Released: 03/07/2007 Document Revised: 01/17/2012 Document Reviewed: 05/08/2007 [facility] Patient Information 2014 [place], [place].

## 2024-07-27 ENCOUNTER — Other Ambulatory Visit: Payer: Self-pay

## 2024-07-27 ENCOUNTER — Encounter (HOSPITAL_COMMUNITY): Payer: Self-pay

## 2024-07-27 ENCOUNTER — Encounter (HOSPITAL_COMMUNITY)
Admission: RE | Admit: 2024-07-27 | Discharge: 2024-07-27 | Disposition: A | Discharge status: Home / Self Care | Source: Ambulatory Visit | Attending: Orthopedic Surgery | Admitting: Orthopedic Surgery

## 2024-07-27 VITALS — BP 122/76 | HR 69 | Temp 97.9°F | Resp 16 | Ht 64.0 in | Wt 175.0 lb

## 2024-07-27 DIAGNOSIS — Z01812 Encounter for preprocedural laboratory examination: Secondary | ICD-10-CM | POA: Insufficient documentation

## 2024-07-27 DIAGNOSIS — Z01818 Encounter for other preprocedural examination: Secondary | ICD-10-CM | POA: Diagnosis present

## 2024-07-27 HISTORY — DX: Dyspnea, unspecified: R06.00

## 2024-07-27 LAB — CBC
HCT: 44.8 % (ref 36.0–46.0)
Hemoglobin: 14 g/dL (ref 12.0–15.0)
MCH: 27.3 pg (ref 26.0–34.0)
MCHC: 31.3 g/dL (ref 30.0–36.0)
MCV: 87.3 fL (ref 80.0–100.0)
Platelets: 258 K/uL (ref 150–400)
RBC: 5.13 MIL/uL — ABNORMAL HIGH (ref 3.87–5.11)
RDW: 14.3 % (ref 11.5–15.5)
WBC: 6.6 K/uL (ref 4.0–10.5)
nRBC: 0 % (ref 0.0–0.2)

## 2024-07-27 LAB — BASIC METABOLIC PANEL WITH GFR
Anion gap: 11 (ref 5–15)
BUN: 16 mg/dL (ref 8–23)
CO2: 28 mmol/L (ref 22–32)
Calcium: 10.2 mg/dL (ref 8.9–10.3)
Chloride: 102 mmol/L (ref 98–111)
Creatinine, Ser: 0.85 mg/dL (ref 0.44–1.00)
GFR, Estimated: >60 mL/min (ref >60)
Glucose, Bld: 113 mg/dL — ABNORMAL HIGH (ref 70–99)
Potassium: 4.8 mmol/L (ref 3.5–5.1)
Sodium: 141 mmol/L (ref 135–145)

## 2024-07-27 LAB — SURGICAL PCR SCREEN
MRSA, PCR: NEGATIVE
Staphylococcus aureus: POSITIVE — AB

## 2024-07-27 NOTE — Progress Notes (Signed)
 Request sent to Dr. Fidel to view pt's pre op PCR from 07/27/24.

## 2024-08-02 ENCOUNTER — Encounter (HOSPITAL_COMMUNITY)
Admission: RE | Disposition: A | Discharge status: Home / Self Care | Payer: Self-pay | Source: Home / Self Care | Attending: Orthopedic Surgery

## 2024-08-02 ENCOUNTER — Ambulatory Visit (HOSPITAL_COMMUNITY): Admitting: Certified Registered Nurse Anesthetist

## 2024-08-02 ENCOUNTER — Ambulatory Visit (HOSPITAL_COMMUNITY)

## 2024-08-02 ENCOUNTER — Encounter (HOSPITAL_COMMUNITY): Payer: Self-pay | Admitting: Orthopedic Surgery

## 2024-08-02 ENCOUNTER — Ambulatory Visit (HOSPITAL_COMMUNITY): Payer: Self-pay | Admitting: Physician Assistant

## 2024-08-02 ENCOUNTER — Ambulatory Visit (HOSPITAL_COMMUNITY)
Admission: RE | Admit: 2024-08-02 | Discharge: 2024-08-03 | Disposition: A | Discharge status: Home / Self Care | Attending: Orthopedic Surgery | Admitting: Orthopedic Surgery

## 2024-08-02 ENCOUNTER — Other Ambulatory Visit: Payer: Self-pay

## 2024-08-02 DIAGNOSIS — F17210 Nicotine dependence, cigarettes, uncomplicated: Secondary | ICD-10-CM | POA: Diagnosis not present

## 2024-08-02 DIAGNOSIS — M1711 Unilateral primary osteoarthritis, right knee: Secondary | ICD-10-CM

## 2024-08-02 DIAGNOSIS — M797 Fibromyalgia: Secondary | ICD-10-CM | POA: Diagnosis not present

## 2024-08-02 DIAGNOSIS — G4733 Obstructive sleep apnea (adult) (pediatric): Secondary | ICD-10-CM | POA: Insufficient documentation

## 2024-08-02 DIAGNOSIS — Z96651 Presence of right artificial knee joint: Secondary | ICD-10-CM

## 2024-08-02 DIAGNOSIS — F419 Anxiety disorder, unspecified: Secondary | ICD-10-CM | POA: Insufficient documentation

## 2024-08-02 DIAGNOSIS — F418 Other specified anxiety disorders: Secondary | ICD-10-CM

## 2024-08-02 DIAGNOSIS — F32A Depression, unspecified: Secondary | ICD-10-CM | POA: Diagnosis not present

## 2024-08-02 SURGERY — ARTHROPLASTY, KNEE, TOTAL, USING IMAGELESS COMPUTER-ASSISTED NAVIGATION
Anesthesia: Spinal | Site: Knee | Laterality: Right

## 2024-08-02 MED ORDER — SODIUM CHLORIDE 0.9 % IV SOLN: INTRAVENOUS | Status: DC

## 2024-08-02 MED ORDER — FENTANYL CITRATE PF 50 MCG/ML IJ SOSY
PREFILLED_SYRINGE | INTRAMUSCULAR | Status: AC
  Filled 2024-08-02: qty 2

## 2024-08-02 MED ORDER — PROPOFOL 10 MG/ML IV BOLUS
INTRAVENOUS | Status: DC | PRN
  Administered 2024-08-02: 40 mg via INTRAVENOUS

## 2024-08-02 MED ORDER — FENTANYL CITRATE PF 50 MCG/ML IJ SOSY
25.0000 ug | PREFILLED_SYRINGE | INTRAMUSCULAR | Status: DC | PRN
  Administered 2024-08-02 (×2): 50 ug via INTRAVENOUS

## 2024-08-02 MED ORDER — PHENOL 1.4 % MT LIQD: 1.0000 | OROMUCOSAL | Status: DC | PRN

## 2024-08-02 MED ORDER — METHOCARBAMOL 500 MG PO TABS
ORAL_TABLET | ORAL | Status: AC
  Filled 2024-08-02: qty 1

## 2024-08-02 MED ORDER — 0.9 % SODIUM CHLORIDE (POUR BTL) OPTIME
TOPICAL | Status: DC | PRN
  Administered 2024-08-02: 1000 mL

## 2024-08-02 MED ORDER — HYDROMORPHONE HCL 1 MG/ML IJ SOLN
0.5000 mg | INTRAMUSCULAR | Status: DC | PRN
  Administered 2024-08-02: 1 mg via INTRAVENOUS
  Administered 2024-08-02: 0.5 mg via INTRAVENOUS
  Filled 2024-08-02 (×2): qty 1

## 2024-08-02 MED ORDER — LACTATED RINGERS IV SOLN: INTRAVENOUS | Status: DC

## 2024-08-02 MED ORDER — SENNA 8.6 MG PO TABS
1.0000 | ORAL_TABLET | Freq: Two times a day (BID) | ORAL | Status: DC
Start: 2024-08-02 — End: 2024-08-03
  Filled 2024-08-02 (×2): qty 1

## 2024-08-02 MED ORDER — BISACODYL 10 MG RE SUPP: 10.0000 mg | Freq: Every day | RECTAL | Status: DC | PRN

## 2024-08-02 MED ORDER — BUPIVACAINE-EPINEPHRINE (PF) 0.25% -1:200000 IJ SOLN
INTRAMUSCULAR | Status: AC
  Filled 2024-08-02: qty 30

## 2024-08-02 MED ORDER — PROPOFOL 1000 MG/100ML IV EMUL
INTRAVENOUS | Status: AC
  Filled 2024-08-02: qty 100

## 2024-08-02 MED ORDER — CEFAZOLIN SODIUM-DEXTROSE 2-4 GM/100ML-% IV SOLN
2.0000 g | INTRAVENOUS | Status: AC
  Administered 2024-08-02: 2 g via INTRAVENOUS
  Filled 2024-08-02: qty 100

## 2024-08-02 MED ORDER — DROPERIDOL 2.5 MG/ML IJ SOLN: 0.6250 mg | Freq: Once | INTRAMUSCULAR | Status: DC | PRN

## 2024-08-02 MED ORDER — POVIDONE-IODINE 10 % EX SWAB: 2.0000 | Freq: Once | CUTANEOUS | Status: DC

## 2024-08-02 MED ORDER — ONDANSETRON HCL 4 MG/2ML IJ SOLN
INTRAMUSCULAR | Status: DC | PRN
  Administered 2024-08-02: 4 mg via INTRAVENOUS

## 2024-08-02 MED ORDER — METOCLOPRAMIDE HCL 5 MG PO TABS: 5.0000 mg | ORAL_TABLET | Freq: Three times a day (TID) | ORAL | Status: DC | PRN

## 2024-08-02 MED ORDER — METHOCARBAMOL 500 MG PO TABS
500.0000 mg | ORAL_TABLET | Freq: Four times a day (QID) | ORAL | Status: DC | PRN
  Administered 2024-08-02: 500 mg via ORAL

## 2024-08-02 MED ORDER — TRANEXAMIC ACID-NACL 1000-0.7 MG/100ML-% IV SOLN
1000.0000 mg | INTRAVENOUS | Status: AC
  Administered 2024-08-02: 1000 mg via INTRAVENOUS
  Filled 2024-08-02: qty 100

## 2024-08-02 MED ORDER — ACETAMINOPHEN 10 MG/ML IV SOLN
INTRAVENOUS | Status: AC
Start: 2024-08-02 — End: 2024-08-02
  Filled 2024-08-02: qty 100

## 2024-08-02 MED ORDER — ISOPROPYL ALCOHOL 70 % SOLN
Status: AC
  Filled 2024-08-02: qty 480

## 2024-08-02 MED ORDER — ROPIVACAINE HCL 5 MG/ML IJ SOLN
INTRAMUSCULAR | Status: DC | PRN
  Administered 2024-08-02: 20 mL via PERINEURAL

## 2024-08-02 MED ORDER — SODIUM CHLORIDE 0.9 % IR SOLN
Status: DC | PRN
  Administered 2024-08-02: 3000 mL

## 2024-08-02 MED ORDER — SODIUM CHLORIDE (PF) 0.9 % IJ SOLN
INTRAMUSCULAR | Status: DC | PRN
  Administered 2024-08-02: 61 mL

## 2024-08-02 MED ORDER — METOCLOPRAMIDE HCL 5 MG/ML IJ SOLN: 5.0000 mg | Freq: Three times a day (TID) | INTRAMUSCULAR | Status: DC | PRN

## 2024-08-02 MED ORDER — ALUM & MAG HYDROXIDE-SIMETH 200-200-20 MG/5ML PO SUSP: 30.0000 mL | ORAL | Status: DC | PRN

## 2024-08-02 MED ORDER — OXYCODONE HCL 5 MG PO TABS: 5.0000 mg | ORAL_TABLET | ORAL | Status: DC | PRN

## 2024-08-02 MED ORDER — CHLORHEXIDINE GLUCONATE 4 % EX SOLN: 1.0000 | CUTANEOUS | 1 refills | Status: AC

## 2024-08-02 MED ORDER — MUPIROCIN 2 % EX OINT: 1.0000 | TOPICAL_OINTMENT | Freq: Two times a day (BID) | CUTANEOUS | 0 refills | Status: AC

## 2024-08-02 MED ORDER — ARIPIPRAZOLE 10 MG PO TABS
20.0000 mg | ORAL_TABLET | Freq: Every day | ORAL | Status: DC
  Administered 2024-08-03: 20 mg via ORAL
  Filled 2024-08-02: qty 2

## 2024-08-02 MED ORDER — ACETAMINOPHEN 500 MG PO TABS
1000.0000 mg | ORAL_TABLET | Freq: Once | ORAL | Status: AC
  Administered 2024-08-02: 1000 mg via ORAL
  Filled 2024-08-02: qty 2

## 2024-08-02 MED ORDER — FENTANYL CITRATE PF 50 MCG/ML IJ SOSY
50.0000 ug | PREFILLED_SYRINGE | Freq: Once | INTRAMUSCULAR | Status: AC
  Administered 2024-08-02: 50 ug via INTRAVENOUS
  Filled 2024-08-02: qty 2

## 2024-08-02 MED ORDER — ONDANSETRON HCL 4 MG/2ML IJ SOLN
INTRAMUSCULAR | Status: AC
  Filled 2024-08-02: qty 2

## 2024-08-02 MED ORDER — MUPIROCIN 2 % EX OINT
TOPICAL_OINTMENT | Freq: Two times a day (BID) | CUTANEOUS | Status: DC
  Filled 2024-08-02 (×2): qty 22

## 2024-08-02 MED ORDER — BUSPIRONE HCL 10 MG PO TABS: 10.0000 mg | ORAL_TABLET | Freq: Three times a day (TID) | ORAL | Status: DC | PRN

## 2024-08-02 MED ORDER — ORAL CARE MOUTH RINSE: 15.0000 mL | Freq: Once | OROMUCOSAL | Status: AC

## 2024-08-02 MED ORDER — MIDAZOLAM HCL 2 MG/2ML IJ SOLN: 1.0000 mg | Freq: Once | INTRAMUSCULAR | Status: DC

## 2024-08-02 MED ORDER — PREGABALIN 75 MG PO CAPS
150.0000 mg | ORAL_CAPSULE | Freq: Two times a day (BID) | ORAL | Status: DC
Start: 2024-08-02 — End: 2024-08-03
  Administered 2024-08-02 – 2024-08-03 (×2): 150 mg via ORAL
  Filled 2024-08-02 (×2): qty 2

## 2024-08-02 MED ORDER — ACETAMINOPHEN 325 MG PO TABS: 325.0000 mg | ORAL_TABLET | Freq: Four times a day (QID) | ORAL | Status: DC | PRN

## 2024-08-02 MED ORDER — ESCITALOPRAM OXALATE 20 MG PO TABS
20.0000 mg | ORAL_TABLET | Freq: Two times a day (BID) | ORAL | Status: DC
  Administered 2024-08-02 – 2024-08-03 (×2): 20 mg via ORAL
  Filled 2024-08-02 (×2): qty 1

## 2024-08-02 MED ORDER — SODIUM CHLORIDE (PF) 0.9 % IJ SOLN
INTRAMUSCULAR | Status: AC
  Filled 2024-08-02: qty 30

## 2024-08-02 MED ORDER — STERILE WATER FOR IRRIGATION IR SOLN
Status: DC | PRN
  Administered 2024-08-02: 2000 mL

## 2024-08-02 MED ORDER — CEFAZOLIN SODIUM-DEXTROSE 2-4 GM/100ML-% IV SOLN
2.0000 g | Freq: Four times a day (QID) | INTRAVENOUS | Status: AC
  Administered 2024-08-02 (×2): 2 g via INTRAVENOUS
  Filled 2024-08-02 (×2): qty 100

## 2024-08-02 MED ORDER — OXYCODONE HCL 5 MG PO TABS
5.0000 mg | ORAL_TABLET | Freq: Once | ORAL | Status: AC | PRN
  Administered 2024-08-02: 5 mg via ORAL

## 2024-08-02 MED ORDER — METHOCARBAMOL 1000 MG/10ML IJ SOLN: 500.0000 mg | Freq: Four times a day (QID) | INTRAMUSCULAR | Status: DC | PRN

## 2024-08-02 MED ORDER — ACETAMINOPHEN 500 MG PO TABS
1000.0000 mg | ORAL_TABLET | Freq: Four times a day (QID) | ORAL | Status: AC
Start: 2024-08-02 — End: 2024-08-03
  Administered 2024-08-02 – 2024-08-03 (×3): 1000 mg via ORAL
  Filled 2024-08-02 (×4): qty 2

## 2024-08-02 MED ORDER — ONDANSETRON HCL 4 MG/2ML IJ SOLN: 4.0000 mg | Freq: Four times a day (QID) | INTRAMUSCULAR | Status: DC | PRN

## 2024-08-02 MED ORDER — KETOROLAC TROMETHAMINE 15 MG/ML IJ SOLN
15.0000 mg | Freq: Four times a day (QID) | INTRAMUSCULAR | Status: AC
  Administered 2024-08-02 – 2024-08-03 (×4): 15 mg via INTRAVENOUS
  Filled 2024-08-02 (×4): qty 1

## 2024-08-02 MED ORDER — PROPOFOL 1000 MG/100ML IV EMUL
INTRAVENOUS | Status: AC
Start: 2024-08-02 — End: 2024-08-02
  Filled 2024-08-02: qty 100

## 2024-08-02 MED ORDER — ISOPROPYL ALCOHOL 70 % SOLN
Status: DC | PRN
  Administered 2024-08-02: 1 via TOPICAL

## 2024-08-02 MED ORDER — KETOROLAC TROMETHAMINE 30 MG/ML IJ SOLN
INTRAMUSCULAR | Status: AC
  Filled 2024-08-02: qty 1

## 2024-08-02 MED ORDER — PHENYLEPHRINE HCL-NACL 20-0.9 MG/250ML-% IV SOLN
INTRAVENOUS | Status: DC | PRN
  Administered 2024-08-02: 35 ug/min via INTRAVENOUS

## 2024-08-02 MED ORDER — POLYETHYLENE GLYCOL 3350 17 G PO PACK: 17.0000 g | PACK | Freq: Every day | ORAL | Status: DC | PRN

## 2024-08-02 MED ORDER — PROPOFOL 500 MG/50ML IV EMUL
INTRAVENOUS | Status: DC | PRN
  Administered 2024-08-02: 100 ug/kg/min via INTRAVENOUS

## 2024-08-02 MED ORDER — OXYCODONE HCL 5 MG PO TABS
10.0000 mg | ORAL_TABLET | ORAL | Status: DC | PRN
  Administered 2024-08-02 – 2024-08-03 (×3): 10 mg via ORAL
  Filled 2024-08-02 (×3): qty 2

## 2024-08-02 MED ORDER — OXYCODONE HCL 5 MG PO TABS
ORAL_TABLET | ORAL | Status: AC
  Filled 2024-08-02: qty 1

## 2024-08-02 MED ORDER — BENZTROPINE MESYLATE 0.5 MG PO TABS
1.0000 mg | ORAL_TABLET | Freq: Every day | ORAL | Status: DC
  Administered 2024-08-02: 1 mg via ORAL
  Filled 2024-08-02: qty 2

## 2024-08-02 MED ORDER — POVIDONE-IODINE 10 % EX SWAB
2.0000 | Freq: Once | CUTANEOUS | Status: AC
  Administered 2024-08-02: 2 via TOPICAL

## 2024-08-02 MED ORDER — MELATONIN 5 MG PO TABS
5.0000 mg | ORAL_TABLET | Freq: Every day | ORAL | Status: DC
Start: 2024-08-02 — End: 2024-08-03
  Filled 2024-08-02: qty 1

## 2024-08-02 MED ORDER — ASPIRIN 81 MG PO CHEW
81.0000 mg | CHEWABLE_TABLET | Freq: Two times a day (BID) | ORAL | Status: DC
Start: 2024-08-02 — End: 2024-08-03
  Administered 2024-08-02 – 2024-08-03 (×2): 81 mg via ORAL
  Filled 2024-08-02 (×2): qty 1

## 2024-08-02 MED ORDER — ACETAMINOPHEN 10 MG/ML IV SOLN
1000.0000 mg | Freq: Once | INTRAVENOUS | Status: DC | PRN
  Administered 2024-08-02: 1000 mg via INTRAVENOUS

## 2024-08-02 MED ORDER — ONDANSETRON HCL 4 MG PO TABS: 4.0000 mg | ORAL_TABLET | Freq: Four times a day (QID) | ORAL | Status: DC | PRN

## 2024-08-02 MED ORDER — DIPHENHYDRAMINE HCL 12.5 MG/5ML PO ELIX: 12.5000 mg | ORAL_SOLUTION | ORAL | Status: DC | PRN

## 2024-08-02 MED ORDER — OXYCODONE HCL 5 MG/5ML PO SOLN: 5.0000 mg | Freq: Once | ORAL | Status: AC | PRN

## 2024-08-02 MED ORDER — PROPRANOLOL HCL ER 60 MG PO CP24
120.0000 mg | ORAL_CAPSULE | Freq: Two times a day (BID) | ORAL | Status: DC
  Filled 2024-08-02 (×3): qty 2

## 2024-08-02 MED ORDER — PANTOPRAZOLE SODIUM 40 MG PO TBEC
40.0000 mg | DELAYED_RELEASE_TABLET | Freq: Every day | ORAL | Status: DC
Start: 2024-08-02 — End: 2024-08-03
  Administered 2024-08-02: 40 mg via ORAL
  Filled 2024-08-02 (×2): qty 1

## 2024-08-02 MED ORDER — SODIUM CHLORIDE 0.9 % IV BOLUS
500.0000 mL | Freq: Once | INTRAVENOUS | Status: AC
  Administered 2024-08-02: 500 mL via INTRAVENOUS

## 2024-08-02 MED ORDER — MENTHOL 3 MG MT LOZG: 1.0000 | LOZENGE | OROMUCOSAL | Status: DC | PRN

## 2024-08-02 MED ORDER — DOCUSATE SODIUM 100 MG PO CAPS
100.0000 mg | ORAL_CAPSULE | Freq: Two times a day (BID) | ORAL | Status: DC
Start: 2024-08-02 — End: 2024-08-03
  Filled 2024-08-02 (×2): qty 1

## 2024-08-02 MED ORDER — LACTATED RINGERS IV SOLN
INTRAVENOUS | Status: DC
Start: 2024-08-02 — End: 2024-08-02

## 2024-08-02 MED ORDER — CHLORHEXIDINE GLUCONATE 0.12 % MT SOLN
15.0000 mL | Freq: Once | OROMUCOSAL | Status: AC
  Administered 2024-08-02: 15 mL via OROMUCOSAL

## 2024-08-02 MED ORDER — BUPIVACAINE IN DEXTROSE 0.75-8.25 % IT SOLN
INTRATHECAL | Status: DC | PRN
  Administered 2024-08-02: 1.8 mL via INTRATHECAL

## 2024-08-02 SURGICAL SUPPLY — 60 items
BAG COUNTER SPONGE SURGICOUNT (BAG) IMPLANT
BAG ZIPLOCK 12X15 (MISCELLANEOUS) ×2 IMPLANT
BATTERY INSTRU NAVIGATION (MISCELLANEOUS) ×6 IMPLANT
BLADE SAGITTAL AGGR TOOTH XLG (BLADE) ×2 IMPLANT
BLADE SAW RECIPROCATING 77.5 (BLADE) ×2 IMPLANT
BLADE SAW SAG 35X64 .89 (BLADE) ×2 IMPLANT
BNDG ELASTIC 3X5.8 VLCR NS LF (GAUZE/BANDAGES/DRESSINGS) IMPLANT
BNDG ELASTIC 4INX 5YD STR LF (GAUZE/BANDAGES/DRESSINGS) ×2 IMPLANT
BNDG ELASTIC 4X5.8 VLCR NS LF (GAUZE/BANDAGES/DRESSINGS) IMPLANT
BNDG ELASTIC 6INX 5YD STR LF (GAUZE/BANDAGES/DRESSINGS) ×2 IMPLANT
CHLORAPREP W/TINT 26 (MISCELLANEOUS) ×4 IMPLANT
COMPONENT FEM PS KNEE NRW 7 RT (Joint) IMPLANT
COMPONENT PATELLA PEG 3 32 (Joint) IMPLANT
COVER SURGICAL LIGHT HANDLE (MISCELLANEOUS) ×2 IMPLANT
DERMABOND ADVANCED .7 DNX12 (GAUZE/BANDAGES/DRESSINGS) ×4 IMPLANT
DRAPE SHEET LG 3/4 BI-LAMINATE (DRAPES) ×6 IMPLANT
DRAPE U-SHAPE 47X51 STRL (DRAPES) ×2 IMPLANT
DRESSING AQUACEL AG SP 3.5X10 (GAUZE/BANDAGES/DRESSINGS) IMPLANT
DRSG AQUACEL AG ADV 3.5X10 (GAUZE/BANDAGES/DRESSINGS) ×2 IMPLANT
ELECT BLADE TIP CTD 4 INCH (ELECTRODE) ×2 IMPLANT
ELECT PENCIL ROCKER SW 15FT (MISCELLANEOUS) ×2 IMPLANT
ELECT REM PT RETURN 15FT ADLT (MISCELLANEOUS) ×2 IMPLANT
GAUZE SPONGE 4X4 12PLY STRL (GAUZE/BANDAGES/DRESSINGS) ×2 IMPLANT
GLOVE BIO SURGEON STRL SZ7 (GLOVE) ×2 IMPLANT
GLOVE BIO SURGEON STRL SZ8.5 (GLOVE) ×4 IMPLANT
GLOVE BIOGEL PI IND STRL 7.5 (GLOVE) ×2 IMPLANT
GLOVE BIOGEL PI IND STRL 8.5 (GLOVE) ×2 IMPLANT
GOWN SPEC L3 XXLG W/TWL (GOWN DISPOSABLE) ×2 IMPLANT
GOWN STRL REUS W/ TWL XL LVL3 (GOWN DISPOSABLE) ×2 IMPLANT
HOLDER FOLEY CATH W/STRAP (MISCELLANEOUS) ×2 IMPLANT
HOOD PEEL AWAY T7 (MISCELLANEOUS) ×6 IMPLANT
KIT TURNOVER KIT A (KITS) ×2 IMPLANT
LINER TIB ASF PS CD/3-9 11 RT (Liner) IMPLANT
MARKER SKIN DUAL TIP RULER LAB (MISCELLANEOUS) ×2 IMPLANT
NDL SAFETY ECLIPSE 18X1.5 (NEEDLE) ×2 IMPLANT
NDL SPNL 18GX3.5 QUINCKE PK (NEEDLE) ×2 IMPLANT
NEEDLE SPNL 18GX3.5 QUINCKE PK (NEEDLE) ×1 IMPLANT
NS IRRIG 1000ML POUR BTL (IV SOLUTION) ×2 IMPLANT
PACK TOTAL KNEE CUSTOM (KITS) ×2 IMPLANT
PADDING CAST COTTON 6X4 STRL (CAST SUPPLIES) ×2 IMPLANT
PIN DRILL HDLS TROCAR 75 4PK (PIN) IMPLANT
PROTECTOR NERVE ULNAR (MISCELLANEOUS) ×2 IMPLANT
SCREW FEMALE HEX FIX 25X2.5 (ORTHOPEDIC DISPOSABLE SUPPLIES) IMPLANT
SEALER BIPOLAR AQUA 6.0 (INSTRUMENTS) ×2 IMPLANT
SET HNDPC FAN SPRY TIP SCT (DISPOSABLE) ×2 IMPLANT
SET PAD KNEE POSITIONER (MISCELLANEOUS) ×2 IMPLANT
SOLUTION PRONTOSAN WOUND 350ML (IRRIGATION / IRRIGATOR) ×2 IMPLANT
SPIKE FLUID TRANSFER (MISCELLANEOUS) ×4 IMPLANT
STEM TIB PS KNEE D 0D RT (Stem) IMPLANT
SUT MNCRL AB 3-0 PS2 18 (SUTURE) ×2 IMPLANT
SUT MON AB 2-0 CT1 36 (SUTURE) ×2 IMPLANT
SUT STRATAFIX 14 PDO 48 VLT (SUTURE) ×2 IMPLANT
SUT VIC AB 1 CTX36XBRD ANBCTR (SUTURE) ×4 IMPLANT
SUT VIC AB 2-0 CT1 TAPERPNT 27 (SUTURE) ×2 IMPLANT
SYR 3ML LL SCALE MARK (SYRINGE) ×2 IMPLANT
TOWEL GREEN STERILE FF (TOWEL DISPOSABLE) ×2 IMPLANT
TRAY FOLEY MTR SLVR 16FR STAT (SET/KITS/TRAYS/PACK) ×2 IMPLANT
TUBE SUCTION HIGH CAP CLEAR NV (SUCTIONS) ×2 IMPLANT
WATER STERILE IRR 1000ML POUR (IV SOLUTION) ×4 IMPLANT
WRAP KNEE MAXI GEL POST OP (GAUZE/BANDAGES/DRESSINGS) ×2 IMPLANT

## 2024-08-02 NOTE — Anesthesia Procedure Notes (Signed)
 Procedure Name: MAC Date/Time: 08/02/2024 10:24 AM  Performed by: Zulema Leita PARAS, CRNAPre-anesthesia Checklist: Patient identified, Emergency Drugs available, Suction available and Patient being monitored Oxygen Delivery Method: Simple face mask

## 2024-08-02 NOTE — Plan of Care (Signed)

## 2024-08-02 NOTE — Anesthesia Procedure Notes (Signed)
 Spinal  Patient location during procedure: OR End time: 08/02/2024 10:33 AM Reason for block: surgical anesthesia Staffing Performed: resident/CRNA  Resident/CRNA: Zulema Leita PARAS, CRNA Performed by: Zulema Leita PARAS, CRNA Authorized by: Erma Thom SAUNDERS, MD   Preanesthetic Checklist Completed: patient identified, IV checked, site marked, risks and benefits discussed, surgical consent, monitors and equipment checked, pre-op evaluation and timeout performed Spinal Block Patient position: sitting Prep: DuraPrep Patient monitoring: heart rate, cardiac monitor, continuous pulse ox and blood pressure Approach: midline Location: L3-4 Injection technique: single-shot Needle Needle type: Sprotte and Pencan  Needle gauge: 24 G Needle length: 10 cm Assessment Sensory level: T4 Events: CSF return Additional Notes IV functioning, monitors applied to pt. Expiration date of kit checked and confirmed to be in date. Sterile prep and drape, hand hygiene and sterile gloves used. Pt was positioned and spine was prepped in sterile fashion. Skin was anesthetized with lidocaine . Free flow of clear CSF obtained prior to injecting local anesthetic into CSF x 1 attempt. Spinal needle aspirated freely following injection. Needle was carefully withdrawn, and pt tolerated procedure well. Loss of motor and sensory on exam post injection. Dr Erma at bedside for entire placement.

## 2024-08-02 NOTE — Interval H&P Note (Signed)
 History and Physical Interval Note:  08/02/2024 10:15 AM  [name]  has presented today for surgery, with the diagnosis of Right knee osteoarthritis.  The various methods of treatment have been discussed with the patient and family. After consideration of risks, benefits and other options for treatment, the patient has consented to  Procedure(s): ARTHROPLASTY, KNEE, TOTAL, USING IMAGELESS COMPUTER-ASSISTED NAVIGATION (Right) as a surgical intervention.  The patient's history has been reviewed, patient examined, no change in status, stable for surgery.  I have reviewed the patient's chart and labs.  Questions were answered to the patient's satisfaction.     Redell PARAS [place]

## 2024-08-02 NOTE — Anesthesia Procedure Notes (Signed)
 Anesthesia Regional Block: Adductor canal block   Pre-Anesthetic Checklist: , timeout performed,  Correct Patient, Correct Site, Correct Laterality,  Correct Procedure, Correct Position, site marked,  Risks and benefits discussed,  Surgical consent,  Pre-op evaluation,  At surgeon's request and post-op pain management  Laterality: Right  Prep: chloraprep       Needles:  Injection technique: Single-shot  Needle Type: Echogenic Stimulator Needle     Needle Length: 9cm  Needle Gauge: 21     Additional Needles:   Procedures:,,,, ultrasound used (permanent image in chart),,    Narrative:  Start time: 08/02/2024 9:22 AM End time: 08/02/2024 9:28 AM Injection made incrementally with aspirations every 5 mL.  Performed by: Personally  Anesthesiologist: Erma Thom SAUNDERS, MD  Additional Notes: Discussed risks and benefits of the nerve block in detail, including but not limited vascular injury, permanent nerve damage and infection.   Patient tolerated the procedure well. Local anesthetic introduced in an incremental fashion under minimal resistance after negative aspirations. No paresthesias were elicited. After completion of the procedure, no acute issues were identified and patient continued to be monitored by RN.

## 2024-08-02 NOTE — Op Note (Signed)
 OPERATIVE REPORT  SURGEON: Redell Shoals, MD   ASSISTANT: Valery Potters, PA-C  PREOPERATIVE DIAGNOSIS: Primary Right knee arthritis.   POSTOPERATIVE DIAGNOSIS: Primary Right knee arthritis.   PROCEDURE: Computer assisted Right total knee arthroplasty.   IMPLANTS: Zimmer Persona PPS Cementless CR femur, size 7 narrow. Persona 0 degree Spiked Keel OsseoTi Tibia, size D. Vivacit-E polyethelyene insert, size 11 mm, CR. OsseoTi 3-Peg patella, size 32 mm.  ANESTHESIA:  MAC, Regional, and Spinal  TOURNIQUET TIME: Not utilized.   ESTIMATED BLOOD LOSS:-300 mL    ANTIBIOTICS: 2g Ancef .  DRAINS: None.  COMPLICATIONS: None   CONDITION: PACU - hemodynamically stable.   BRIEF CLINICAL NOTE: [name] is a 63 y.o. female with a long-standing history of Right knee arthritis. After failing conservative management, the patient was indicated for total knee arthroplasty. The risks, benefits, and alternatives to the procedure were explained, and the patient elected to proceed.  PROCEDURE IN DETAIL: Adductor canal block was obtained in the pre-op holding area. Once inside the operative room, spinal anesthesia was obtained, and a foley catheter was inserted. The patient was then positioned and the lower extremity was prepped and draped in the normal sterile surgical fashion.  A time-out was called verifying side and site of surgery. The patient received IV antibiotics within 60 minutes of beginning the procedure. A tourniquet was not utilized.   An anterior approach to the knee was performed utilizing a midvastus arthrotomy. A medial release was performed and the patellar fat pad was excised. Stryker imageless navigation was used to cut the distal femur perpendicular to the mechanical axis. A freehand patellar resection was performed, and the patella was sized and prepared with 3 lug holes.  Nagivation was used to make a neutral proximal tibia resection, taking 9 mm of bone from the less affected  lateral side with 3 degrees of slope. The menisci were excised. A spacer block was placed, and the alignment and balance in extension were confirmed.   The distal femur was sized using the 3-degree external rotation guide referencing the posterior femoral cortex. The appropriate 4-in-1 cutting block was pinned into place. Rotation was checked using Whiteside's line, the epicondylar axis, and then confirmed with a spacer block in flexion. The remaining femoral cuts were performed, taking care to protect the MCL.  The tibia was sized and the trial tray was pinned into place. The remaining trail components were inserted. The knee was stable to varus and valgus stress through a full range of motion. The patella tracked centrally, and the PCL was well balanced. The trial components were removed, and the proximal tibial surface was prepared. Final components were impacted into place. The knee was tested for a final time and found to be well balanced.   The wound was copiously irrigated with Prontosan solution and normal saline using pulse lavage.  Marcaine  solution was injected into the periarticular soft tissue.  The wound was closed in layers using #1 Vicryl and Stratafix for the fascia, 2-0 Vicryl for the subcutaneous fat, 2-0 Monocryl for the deep dermal layer, 3-0 running Monocryl subcuticular Stitch, and 4-0 Monocryl stay sutures at both ends of the wound. Dermabond was applied to the skin.  Once the glue was fully dried, an Aquacell Ag and compressive dressing were applied.  The patient was transported to the recovery room in stable condition.  Sponge, needle, and instrument counts were correct at the end of the case x2.  The patient tolerated the procedure well and there were no known  complications.  Please note that a surgical assistant was a medical necessity for this procedure in order to perform it in a safe and expeditious manner. Surgical assistant was necessary to retract the ligaments and vital  neurovascular structures to prevent injury to them and also necessary for proper positioning of the limb to allow for anatomic placement of the prosthesis.

## 2024-08-02 NOTE — Anesthesia Preprocedure Evaluation (Addendum)
 Anesthesia Evaluation  Patient identified by MRN, date of birth, ID band Patient awake    Reviewed: Allergy & Precautions, H&P , NPO status , Patient's Chart, lab work & pertinent test results  History of Anesthesia Complications (+) PONV and history of anesthetic complications  Airway Mallampati: II  TM Distance: >3 FB Neck ROM: Full    Dental no notable dental hx.    Pulmonary sleep apnea , Current Smoker   Pulmonary exam normal breath sounds clear to auscultation       Cardiovascular (-) angina (-) Past MI negative cardio ROS Normal cardiovascular exam Rhythm:Regular Rate:Normal     Neuro/Psych  PSYCHIATRIC DISORDERS Anxiety Depression    negative neurological ROS     GI/Hepatic Neg liver ROS,GERD  ,,  Endo/Other  Hypothyroidism    Renal/GU negative Renal ROS  negative genitourinary   Musculoskeletal  (+) Arthritis ,  Fibromyalgia -  Abdominal   Peds negative pediatric ROS (+)  Hematology negative hematology ROS (+)   Anesthesia Other Findings   Reproductive/Obstetrics negative OB ROS                              Anesthesia Physical Anesthesia Plan  ASA: 3  Anesthesia Plan: Spinal   Post-op Pain Management: Regional block* and Tylenol  PO (pre-op)*   Induction:   PONV Risk Score and Plan: 3 and Treatment may vary due to age or medical condition  Airway Management Planned: Natural Airway  Additional Equipment:   Intra-op Plan:   Post-operative Plan:   Informed Consent: I have reviewed the patients History and Physical, chart, labs and discussed the procedure including the risks, benefits and alternatives for the proposed anesthesia with the patient or authorized representative who has indicated his/her understanding and acceptance.     Dental advisory given  Plan Discussed with: CRNA  Anesthesia Plan Comments:          Anesthesia Quick Evaluation

## 2024-08-02 NOTE — Transfer of Care (Signed)
 Immediate Anesthesia Transfer of Care Note  Patient: [name]  Procedure(s) Performed: ARTHROPLASTY, KNEE, TOTAL, USING IMAGELESS COMPUTER-ASSISTED NAVIGATION (Right: Knee)  Patient Location: PACU  Anesthesia Type:General  Level of Consciousness: awake, alert , and oriented  Airway & Oxygen Therapy: Patient Spontanous Breathing and Patient connected to face mask oxygen  Post-op Assessment: Report given to RN and Post -op Vital signs reviewed and stable  Post vital signs: Reviewed and stable  Last Vitals:  Vitals Value Taken Time  BP    Temp    Pulse 74 08/02/24 13:44  Resp 13 08/02/24 13:44  SpO2 97 % 08/02/24 13:44  Vitals shown include unfiled device data.  Last Pain:  Vitals:   08/02/24 0925  TempSrc:   PainSc: 0-No pain         Complications: No notable events documented.

## 2024-08-02 NOTE — Discharge Instructions (Signed)

## 2024-08-03 DIAGNOSIS — M1711 Unilateral primary osteoarthritis, right knee: Secondary | ICD-10-CM | POA: Diagnosis not present

## 2024-08-03 LAB — CBC
HCT: 33 % — ABNORMAL LOW (ref 36.0–46.0)
Hemoglobin: 10 g/dL — ABNORMAL LOW (ref 12.0–15.0)
MCH: 27 pg (ref 26.0–34.0)
MCHC: 30.3 g/dL (ref 30.0–36.0)
MCV: 88.9 fL (ref 80.0–100.0)
Platelets: 188 K/uL (ref 150–400)
RBC: 3.71 MIL/uL — ABNORMAL LOW (ref 3.87–5.11)
RDW: 14.6 % (ref 11.5–15.5)
WBC: 5.5 K/uL (ref 4.0–10.5)
nRBC: 0 % (ref 0.0–0.2)

## 2024-08-03 LAB — BASIC METABOLIC PANEL WITH GFR
Anion gap: 8 (ref 5–15)
BUN: 11 mg/dL (ref 8–23)
CO2: 27 mmol/L (ref 22–32)
Calcium: 8.4 mg/dL — ABNORMAL LOW (ref 8.9–10.3)
Chloride: 104 mmol/L (ref 98–111)
Creatinine, Ser: 0.87 mg/dL (ref 0.44–1.00)
GFR, Estimated: >60 mL/min (ref >60)
Glucose, Bld: 98 mg/dL (ref 70–99)
Potassium: 3.8 mmol/L (ref 3.5–5.1)
Sodium: 139 mmol/L (ref 135–145)

## 2024-08-03 MED ORDER — ASPIRIN 81 MG PO CHEW
81.0000 mg | CHEWABLE_TABLET | Freq: Two times a day (BID) | ORAL | 0 refills | Status: AC
Start: 2024-08-03 — End: 2024-09-17

## 2024-08-03 MED ORDER — METHOCARBAMOL 500 MG PO TABS
500.0000 mg | ORAL_TABLET | Freq: Three times a day (TID) | ORAL | Status: DC | PRN
Start: 2024-08-03 — End: 2024-08-03

## 2024-08-03 MED ORDER — POLYETHYLENE GLYCOL 3350 17 G PO PACK: 17.0000 g | PACK | Freq: Every day | ORAL | 0 refills | Status: AC | PRN

## 2024-08-03 MED ORDER — ONDANSETRON HCL 4 MG PO TABS: 4.0000 mg | ORAL_TABLET | Freq: Three times a day (TID) | ORAL | 0 refills | Status: AC | PRN

## 2024-08-03 MED ORDER — OXYCODONE HCL 5 MG PO TABS
5.0000 mg | ORAL_TABLET | ORAL | Status: DC | PRN
  Administered 2024-08-03: 5 mg via ORAL
  Filled 2024-08-03: qty 1

## 2024-08-03 MED ORDER — SENNA 8.6 MG PO TABS: 2.0000 | ORAL_TABLET | Freq: Every day | ORAL | 0 refills | Status: AC

## 2024-08-03 MED ORDER — DOCUSATE SODIUM 100 MG PO CAPS
100.0000 mg | ORAL_CAPSULE | Freq: Two times a day (BID) | ORAL | 0 refills | Status: AC
Start: 2024-08-03 — End: 2024-09-02

## 2024-08-03 MED ORDER — OXYCODONE HCL 5 MG PO TABS: 5.0000 mg | ORAL_TABLET | ORAL | 0 refills | Status: AC | PRN

## 2024-08-03 MED ORDER — OXYCODONE HCL 5 MG PO TABS
10.0000 mg | ORAL_TABLET | ORAL | Status: DC | PRN
  Administered 2024-08-03: 10 mg via ORAL
  Filled 2024-08-03: qty 2

## 2024-08-03 NOTE — Anesthesia Postprocedure Evaluation (Signed)
 Anesthesia Post Note  Patient: [name]  Procedure(s) Performed: ARTHROPLASTY, KNEE, TOTAL, USING IMAGELESS COMPUTER-ASSISTED NAVIGATION (Right: Knee)     Patient location during evaluation: PACU Anesthesia Type: Spinal Level of consciousness: oriented and awake and alert Pain management: pain level controlled Vital Signs Assessment: post-procedure vital signs reviewed and stable Respiratory status: spontaneous breathing, respiratory function stable and patient connected to nasal cannula oxygen Cardiovascular status: blood pressure returned to baseline and stable Postop Assessment: no headache, no backache and no apparent nausea or vomiting Anesthetic complications: no   No notable events documented.  Last Vitals:  Vitals:   08/03/24 0626 08/03/24 1006  BP: (!) 98/57 105/64  Pulse: 65 67  Resp: 18 15  Temp: 36.5 C 37.3 C  SpO2: 99% 94%    Last Pain:  Vitals:   08/03/24 1006  TempSrc: Oral  PainSc:                  Thom JONELLE Peoples

## 2024-08-03 NOTE — Progress Notes (Signed)
 Patient was given discharge instructions, and all questions were answered.  Patient was stable for discharge and was taken to the main exit by wheelchair.

## 2024-08-03 NOTE — Progress Notes (Signed)
    Subjective:  Patient reports pain as mild.  Denies N/V/CP/SOB/Abd pain.  Patient is sedated this am. She is Oriented x4, falling asleep during exam.  Discussed with RN and patient to limit narcotics. Encourage incentive spirometry.  O2 via nasal cannula placed this morning.  Medications adjusted this am.  Catheter to be removed this am.  She denies tingling or numbness in LE bilaterally.   Objective:   VITALS:   Vitals:   08/02/24 2017 08/02/24 2225 08/03/24 0209 08/03/24 0626  BP: (!) 87/49 (!) 92/53 (!) 88/50 (!) 98/57  Pulse: (!) 58 (!) 58 (!) 58 65  Resp: 16 16 18 18   Temp: 97.8 F (36.6 C)  98.4 F (36.9 C) 97.7 F (36.5 C)  TempSrc: Oral  Oral Oral  SpO2: 92% 91% 91% 99%  Weight:      Height:        NAD.  Patient is sedated this am. She is Oriented x4, falling asleep during exam.  ABD soft Neurovascular intact Sensation intact distally Intact pulses distally Dorsiflexion/Plantar flexion intact Incision: dressing C/D/I No cellulitis present Compartment soft   Lab Results  Component Value Date   WBC 5.5 08/03/2024   HGB 10.0 (L) 08/03/2024   HCT 33.0 (L) 08/03/2024   MCV 88.9 08/03/2024   PLT 188 08/03/2024   BMET    Component Value Date/Time   NA 139 08/03/2024 0322   NA 145 (H) 04/01/2015 1615   K 3.8 08/03/2024 0322   CL 104 08/03/2024 0322   CO2 27 08/03/2024 0322   GLUCOSE 98 08/03/2024 0322   BUN 11 08/03/2024 0322   BUN 12 04/01/2015 1615   CREATININE 0.87 08/03/2024 0322   CALCIUM 8.4 (L) 08/03/2024 0322   GFRNONAA >60 08/03/2024 0322     Assessment/Plan: 1 Day Post-Op   Principal Problem:   Osteoarthritis of right knee Active Problems:   S/P total knee arthroplasty, right  ABLA. Hemoglobin 10.0. Continue to monitor.   WBAT with walker DVT ppx: Aspirin , SCDs, TEDS PO pain control PT/OT: To come today.  Dispo:  - Patient seems sedated this am, limit narcotics. Continue to monitor BP.  - D/c home once cleared with PT and  voided.    Valery GORMAN Potters 08/03/2024, 7:22 AM   [facility]  [place] [address], [place], [place] [address] Phone: [phone] [email or web address] Facebook  [employer]

## 2024-08-03 NOTE — Progress Notes (Signed)
 Patient was 92-95% on room air. Patient said she was a previous smoker. She has been more alert this afternoon while sitting in the chair. She states she is ready to go home.

## 2024-08-03 NOTE — TOC Transition Note (Signed)
 Transition of Care [facility]) - Discharge Note   Patient Details  Name: [name] MRN: [id number] Date of Birth: [birth year]  Transition of [facility]) CM/SW Contact:  NORMAN ASPEN, LCSW Phone Number: 08/03/2024, 9:50 AM   Clinical Narrative:     Met with pt who confirms she has needed DME in the home.  OPPT already arranged with [facility] ([place]).  No further IP CM needs.  Final next level of care: OP Rehab Barriers to Discharge: No Barriers Identified   Patient Goals and CMS Choice Patient states their goals for this hospitalization and ongoing recovery are:: return home          Discharge Placement                       Discharge Plan and Services Additional resources added to the After Visit Summary for                                       Social Drivers of Health (SDOH) Interventions SDOH Screenings   Food Insecurity: No Food Insecurity (08/02/2024)  Housing: Low Risk  (08/02/2024)  Transportation Needs: No Transportation Needs (08/02/2024)  Utilities: Not At Risk (08/02/2024)  Tobacco Use: High Risk (08/02/2024)     Readmission Risk Interventions     No data to display

## 2024-08-03 NOTE — Evaluation (Signed)
 Physical Therapy Evaluation Patient Details Name: [name] MRN: [id number] DOB: January 16, 1961 Today's Date: 08/03/2024  History of Present Illness  [name] is a 63 y.o. female s/p R TKA 08/02/24. PMH: anxiety, arthritis, breast CA s/p bil mastectomy, depression, fibromyalgia, hypothyroidism, narcolepsy, L TKA 09/15/22  Clinical Impression  Pt is s/p R TKA resulting in the deficits listed below (see PT Problem List). Pt reports from home with spouse, ind without AD at baseline for amb and ADLs/IADLs, reports slow at baseline due to knee pain, denies falls in the last 6 months, 3 entry options into home varying between 5 steps, 6 steps and 8 steps, single level once inside. On eval, pt with good ankle and hip AROM, knee quad activation with SLR and no extensor lag. Pt needing mod A for initial STS and CGA for following, weight shifted to L despite cues. Pt with slow cadence, minimal bil foot clearance, step to gait pattern with minimal RLE weightbearing and stance time, CGA with +2 for safety with O2 tank and chair follow. Pt appears to be somewhat confused, asking to call spouse so therapist can bring her down to the car, decreased initiation with mobility, appears to lack insight requesting water  and phone mid transfer and while toileting. RN notified of BP and Spo2 on 1L. Pt amb short distance then returns to room via recliner, requested to use BSC to void bladder. Pt seated on BSC with NT in room at EOS. Plan to progress mobility with goal to complete stair training so pt can d/c home with spouse support. Pt will benefit from acute skilled PT to increase their independence and safety with mobility to allow discharge.          If plan is discharge home, recommend the following: A little help with walking and/or transfers;A little help with bathing/dressing/bathroom;Assistance with cooking/housework;Assist for transportation;Help with stairs or ramp for entrance   Can travel by private  vehicle        Equipment Recommendations None recommended by PT  Recommendations for Other Services       Functional Status Assessment Patient has had a recent decline in their functional status and demonstrates the ability to make significant improvements in function in a reasonable and predictable amount of time.     Precautions / Restrictions Precautions Precautions: Fall;Knee Precaution/Restrictions Comments: monitor BP and SpO2 Restrictions Weight Bearing Restrictions Per Provider Order: No      Mobility  Bed Mobility Overal bed mobility: Needs Assistance Bed Mobility: Supine to Sit     Supine to sit: Min assist     General bed mobility comments: min A to mobilize to bedside and upright trunk, increased time and effort    Transfers Overall transfer level: Needs assistance Equipment used: Rolling walker (2 wheels) Transfers: Sit to/from Stand Sit to Stand: Mod assist, Contact guard assist           General transfer comment: initial STS mod A to power up with cues for hand placement and BLE activation, slow to rise, significant weigth shift L avoiding RLE weightbearing; following STS reps pt able to maintain correct hand placement, improved RLE weight acceptance though still less than LLE, CGA to power up from bedside and recliner    Ambulation/Gait Ambulation/Gait assistance: Contact guard assist, +2 safety/equipment Gait Distance (Feet): 20 Feet Assistive device: Rolling walker (2 wheels) Gait Pattern/deviations: Step-to pattern, Decreased stride length, Decreased stance time - right, Decreased weight shift to right, Trunk flexed Gait velocity: decreased  General Gait Details: slow, step-to gait pattern, decreased RLE stance time and weightbearing, minimal bil foot clearance, strong BUE use on RW with pt reporting numbness in hands requiring rest break to improve, chair follow for safety, denies dizziness throughout  Stairs            Wheelchair  Mobility     Tilt Bed    Modified Rankin (Stroke Patients Only)       Balance Overall balance assessment: Needs assistance Sitting-balance support: Feet supported Sitting balance-Leahy Scale: Good     Standing balance support: Reliant on assistive device for balance, During functional activity, Bilateral upper extremity supported Standing balance-Leahy Scale: Poor                               Pertinent Vitals/Pain Pain Assessment Pain Assessment: 0-10 Pain Score: 6  Pain Location: R knee Pain Descriptors / Indicators: Aching, Tightness, Grimacing, Discomfort Pain Intervention(s): Limited activity within patient's tolerance, Monitored during session, Repositioned, Ice applied    Home Living Family/patient expects to be discharged to:: Private residence Living Arrangements: Spouse/significant other Available Help at Discharge: Family;Available 24 hours/day Type of Home: House Home Access: Stairs to enter Entrance Stairs-Rails: Doctor, general practice of Steps: 5   Home Layout: One level Home Equipment: Agricultural consultant (2 wheels);Cane - single point (shower stool)      Prior Function Prior Level of Function : Independent/Modified Independent             Mobility Comments: pt reports slowly ambulating without AD, limited by pain at baseline, denies falls ADLs Comments: pt reports ind with ADLs/IADLs, limited by pain     Extremity/Trunk Assessment   Upper Extremity Assessment Upper Extremity Assessment: Overall WFL for tasks assessed    Lower Extremity Assessment Lower Extremity Assessment: RLE deficits/detail RLE Deficits / Details: ankle and hip AROM WFL, able to perform quad set, SLR without extensor lag RLE Sensation: WNL RLE Coordination: WNL    Cervical / Trunk Assessment Cervical / Trunk Assessment: Normal  Communication   Communication Communication: No apparent difficulties    Cognition Arousal: Lethargic Behavior  During Therapy: Flat affect   PT - Cognitive impairments: No family/caregiver present to determine baseline                       PT - Cognition Comments: pt appears somewhat confused, asking to call her husband so therapist can bring her down to the car, slow to respond to questions, appears to lack insight asking for cell phone and water  while being assisted with mobility and toileting, decreased initiation, stating ok now what often during session Following commands: Impaired Following commands impaired: Follows one step commands with increased time     Cueing Cueing Techniques: Verbal cues, Gestural cues, Tactile cues, Visual cues     General Comments General comments (skin integrity, edema, etc.): Pt on 1L with SpO2 89-94%- RN notified; BP in sitting before amb 112/63 and BP in sitting after amb 91/67 - notified RN    Exercises Total Joint Exercises Ankle Circles/Pumps: AROM, Both, 10 reps, Supine Quad Sets: AROM, Right, 5 reps, Supine   Assessment/Plan    PT Assessment Patient needs continued PT services  PT Problem List Decreased strength;Decreased range of motion;Decreased activity tolerance;Decreased balance;Decreased mobility;Decreased knowledge of use of DME;Decreased safety awareness;Decreased knowledge of precautions;Cardiopulmonary status limiting activity;Pain       PT Treatment Interventions DME instruction;Gait training;Stair training;Functional  mobility training;Therapeutic activities;Therapeutic exercise;Balance training;Neuromuscular re-education;Cognitive remediation;Patient/family education;Modalities    PT Goals (Current goals can be found in the Care Plan section)  Acute Rehab PT Goals Patient Stated Goal: return home PT Goal Formulation: With patient Time For Goal Achievement: 08/17/24 Potential to Achieve Goals: Good    Frequency 7X/week     Co-evaluation               AM-PAC PT 6 Clicks Mobility  Outcome Measure Help needed  turning from your back to your side while in a flat bed without using bedrails?: A Little Help needed moving from lying on your back to sitting on the side of a flat bed without using bedrails?: A Little Help needed moving to and from a bed to a chair (including a wheelchair)?: A Little Help needed standing up from a chair using your arms (e.g., wheelchair or bedside chair)?: A Little Help needed to walk in hospital room?: A Little Help needed climbing 3-5 steps with a railing? : A Lot 6 Click Score: 17    End of Session Equipment Utilized During Treatment: Gait belt Activity Tolerance: Patient tolerated treatment well Patient left: with call bell/phone within reach;with nursing/sitter in room (on [facility] with NT in room) Nurse Communication: Mobility status;Other (comment) (SpO2, BP, NT in room with pt on [facility]) PT Visit Diagnosis: Muscle weakness (generalized) (M62.81);Difficulty in walking, not elsewhere classified (R26.2);Pain Pain - Right/Left: Right Pain - part of body: Knee    Time: 1028-1100 PT Time Calculation (min) (ACUTE ONLY): 32 min   Charges:   PT Evaluation $PT Eval Low Complexity: 1 Low PT Treatments $Gait Training: 8-22 mins PT General Charges $$ ACUTE PT VISIT: 1 Visit         Tori Julietta Batterman PT, DPT 08/03/24, 12:44 PM

## 2024-08-03 NOTE — Progress Notes (Signed)
 Notified Arvella Fireman, PA of pts low BP for the second time. no new orders at this time, just continue to monitor. Pt asymptomatic, resting in bed, easily arousable.

## 2024-08-03 NOTE — Discharge Summary (Signed)
 Physician Discharge Summary  Patient ID: [name] MRN: [id number] DOB/AGE: 1961-08-13 63 y.o.  Admit date: 08/02/2024 Discharge date: 08/03/2024  Admission Diagnoses:  Osteoarthritis of right knee  Discharge Diagnoses:  Principal Problem:   Osteoarthritis of right knee Active Problems:   S/P total knee arthroplasty, right   Past Medical History:  Diagnosis Date   Anxiety    Arthritis    Breast cancer (HCC)    Status post bilateral mastectomy   Breast reconstruction deformity    Complication of anesthesia    shaking-after mastectomies-resp and hr dropped   Depression, major    suicidal thoughts -implants, mental health, 05/11   Dyspnea    Fibromyalgia    GERD (gastroesophageal reflux disease)    Hypothyroidism    Narcolepsy    OSA on CPAP    PONV (postoperative nausea and vomiting)    Sleep apnea with use of continuous positive airway pressure (CPAP)    AHI 20 in HST, titration to 6 cm water  - residual AHi 6 . remaining hypersomnic.     Surgeries: Procedure(s): ARTHROPLASTY, KNEE, TOTAL, USING IMAGELESS COMPUTER-ASSISTED NAVIGATION on 08/02/2024   Consultants (if any):   Discharged Condition: Improved  Hospital Course: [name] is an 63 y.o. female who was admitted 08/02/2024 with a diagnosis of Osteoarthritis of right knee and went to the operating room on 08/02/2024 and underwent the above named procedures.    She was given perioperative antibiotics:  Anti-infectives (From admission, onward)    Start     Dose/Rate Route Frequency Ordered Stop   08/02/24 1630  ceFAZolin  (ANCEF ) IVPB 2g/100 mL premix        2 g 200 mL/hr over 30 Minutes Intravenous Every 6 hours 08/02/24 1623 08/02/24 2302   08/02/24 0845  ceFAZolin  (ANCEF ) IVPB 2g/100 mL premix        2 g 200 mL/hr over 30 Minutes Intravenous On call to O.R. 08/02/24 [id number] 08/02/24 1034       She was given sequential compression devices, early ambulation, and aspirin  for DVT prophylaxis.  POD#1  Patient doing well. Medications adjusted. She ambulated well with PT 20 and 80 feet. Patient to use incentive spirometry at home. D/c home with OPPT. Follow-up in office in 2 weeks.   She benefited maximally from the hospital stay and there were no complications.    Recent vital signs:  Vitals:   08/03/24 1343 08/03/24 1603  BP: 107/61 108/63  Pulse: 72 66  Resp: 16 18  Temp: 98.2 F (36.8 C) 98.7 F (37.1 C)  SpO2: 96% 97%    Recent laboratory studies:  Lab Results  Component Value Date   HGB 10.0 (L) 08/03/2024   HGB 14.0 07/27/2024   HGB 12.9 11/15/2023   Lab Results  Component Value Date   WBC 5.5 08/03/2024   PLT 188 08/03/2024   Lab Results  Component Value Date   INR 0.99 01/07/2017   Lab Results  Component Value Date   NA 139 08/03/2024   K 3.8 08/03/2024   CL 104 08/03/2024   CO2 27 08/03/2024   BUN 11 08/03/2024   CREATININE 0.87 08/03/2024   GLUCOSE 98 08/03/2024     Allergies as of 08/03/2024       Reactions   Sulfonamide Derivatives Hives, Other (See Comments)   FEVER   Hydrocodone  Nausea And Vomiting   Latex Rash        Medication List     STOP taking these medications  Percocet 5-325 MG tablet Generic drug: oxyCODONE -acetaminophen        TAKE these medications    ARIPiprazole  20 MG tablet Commonly known as: ABILIFY  Take 20 mg by mouth in the morning.   aspirin  81 MG chewable tablet Commonly known as: Aspirin  Childrens Chew 1 tablet (81 mg total) by mouth 2 (two) times daily with a meal.   B-complex with vitamin C tablet Take 1 tablet by mouth in the morning.   benztropine  1 MG tablet Commonly known as: COGENTIN  Take 1 mg by mouth at bedtime.   busPIRone  10 MG tablet Commonly known as: BUSPAR  Take 10 mg by mouth 3 (three) times daily as needed (anxiety). What changed: when to take this   chlorhexidine  4 % external liquid Commonly known as: HIBICLENS  Apply 15 mLs (1 Application total) topically as directed for 30  doses. Use as directed daily for 5 days every other week for 6 weeks.   diclofenac sodium 1 % Gel Commonly known as: VOLTAREN Apply 1 application topically 4 (four) times daily as needed (pain.).   docusate sodium  100 MG capsule Commonly known as: Colace Take 1 capsule (100 mg total) by mouth 2 (two) times daily.   escitalopram  20 MG tablet Commonly known as: LEXAPRO  Take 20 mg by mouth in the morning and at bedtime.   melatonin 5 MG Tabs Take 5 mg by mouth at bedtime.   methocarbamol  500 MG tablet Commonly known as: ROBAXIN  Take 500 mg by mouth 3 (three) times daily as needed for muscle spasms.   mupirocin  ointment 2 % Commonly known as: BACTROBAN  Place 1 Application into the nose 2 (two) times daily for 60 doses. Use as directed 2 times daily for 5 days every other week for 6 weeks.   NON FORMULARY Pt uses a cpap nightly   ondansetron  4 MG tablet Commonly known as: Zofran  Take 1 tablet (4 mg total) by mouth every 8 (eight) hours as needed for nausea or vomiting.   oxyCODONE  5 MG immediate release tablet Commonly known as: Roxicodone  Take 1 tablet (5 mg total) by mouth every 4 (four) hours as needed for severe pain (pain score 7-10).   polyethylene glycol 17 g packet Commonly known as: MiraLax  Take 17 g by mouth daily as needed for mild constipation or moderate constipation.   pregabalin  150 MG capsule Commonly known as: LYRICA  Take 150 mg by mouth 2 (two) times daily.   propranolol  ER 120 MG 24 hr capsule Commonly known as: INDERAL  LA Take 120 mg by mouth 2 (two) times daily.   senna 8.6 MG Tabs tablet Commonly known as: SENOKOT Take 2 tablets (17.2 mg total) by mouth at bedtime for 15 days.               Discharge Care Instructions  (From admission, onward)           Start     Ordered   08/03/24 0000  Weight bearing as tolerated        08/03/24 1649   08/03/24 0000  Change dressing       Comments: Do not remove your dressing.   08/03/24 1649               WEIGHT BEARING   Weight bearing as tolerated with assist device (walker, cane, etc) as directed, use it as long as suggested by your surgeon or therapist, typically at least 4-6 weeks.   EXERCISES  Results after joint replacement surgery are often greatly improved when you follow  the exercise, range of motion and muscle strengthening exercises prescribed by your doctor. Safety measures are also important to protect the joint from further injury. Any time any of these exercises cause you to have increased pain or swelling, decrease what you are doing until you are comfortable again and then slowly increase them. If you have problems or questions, call your caregiver or physical therapist for advice.   Rehabilitation is important following a joint replacement. After just a few days of immobilization, the muscles of the leg can become weakened and shrink (atrophy).  These exercises are designed to build up the tone and strength of the thigh and leg muscles and to improve motion. Often times heat used for twenty to thirty minutes before working out will loosen up your tissues and help with improving the range of motion but do not use heat for the first two weeks following surgery (sometimes heat can increase post-operative swelling).   These exercises can be done on a training (exercise) mat, on the floor, on a table or on a bed. Use whatever works the best and is most comfortable for you.    Use music or television while you are exercising so that the exercises are a pleasant break in your day. This will make your life better with the exercises acting as a break in your routine that you can look forward to.   Perform all exercises about fifteen times, three times per day or as directed.  You should exercise both the operative leg and the other leg as well.  Exercises include:   Quad Sets - Tighten up the muscle on the front of the thigh (Quad) and hold for 5-10 seconds.   Straight  Leg Raises - With your knee straight (if you were given a brace, keep it on), lift the leg to 60 degrees, hold for 3 seconds, and slowly lower the leg.  Perform this exercise against resistance later as your leg gets stronger.  Leg Slides: Lying on your back, slowly slide your foot toward your buttocks, bending your knee up off the floor (only go as far as is comfortable). Then slowly slide your foot back down until your leg is flat on the floor again.  Angel Wings: Lying on your back spread your legs to the side as far apart as you can without causing discomfort.  Hamstring Strength:  Lying on your back, push your heel against the floor with your leg straight by tightening up the muscles of your buttocks.  Repeat, but this time bend your knee to a comfortable angle, and push your heel against the floor.  You may put a pillow under the heel to make it more comfortable if necessary.   A rehabilitation program following joint replacement surgery can speed recovery and prevent re-injury in the future due to weakened muscles. Contact your doctor or a physical therapist for more information on knee rehabilitation.    CONSTIPATION  Constipation is defined medically as fewer than three stools per week and severe constipation as less than one stool per week.  Even if you have a regular bowel pattern at home, your normal regimen is likely to be disrupted due to multiple reasons following surgery.  Combination of anesthesia, postoperative narcotics, change in appetite and fluid intake all can affect your bowels.   YOU MUST use at least one of the following options; they are listed in order of increasing strength to get the job done.  They are all available over the counter,  and you may need to use some, POSSIBLY even all of these options:    Drink plenty of fluids (prune juice may be helpful) and high fiber foods Colace 100 mg by mouth twice a day  Senokot for constipation as directed and as needed Dulcolax  (bisacodyl ), take with full glass of water   Miralax  (polyethylene glycol) once or twice a day as needed.  If you have tried all these things and are unable to have a bowel movement in the first 3-4 days after surgery call either your surgeon or your primary doctor.    If you experience loose stools or diarrhea, hold the medications until you stool forms back up.  If your symptoms do not get better within 1 week or if they get worse, check with your doctor.  If you experience the worst abdominal pain ever or develop nausea or vomiting, please contact the office immediately for further recommendations for treatment.   ITCHING:  If you experience itching with your medications, try taking only a single pain pill, or even half a pain pill at a time.  You can also use Benadryl  over the counter for itching or also to help with sleep.   TED HOSE STOCKINGS:  Use stockings on both legs until for at least 2 weeks or as directed by physician office. They may be removed at night for sleeping.  MEDICATIONS:  See your medication summary on the "After Visit Summary" that nursing will review with you.  You may have some home medications which will be placed on hold until you complete the course of blood thinner medication.  It is important for you to complete the blood thinner medication as prescribed.  PRECAUTIONS:  If you experience chest pain or shortness of breath - call 911 immediately for transfer to the hospital emergency department.   If you develop a fever greater that 101 F, purulent drainage from wound, increased redness or drainage from wound, foul odor from the wound/dressing, or calf pain - CONTACT YOUR SURGEON.                                                   FOLLOW-UP APPOINTMENTS:  If you do not already have a post-op appointment, please call the office for an appointment to be seen by your surgeon.  Guidelines for how soon to be seen are listed in your "After Visit Summary", but are typically  between 1-4 weeks after surgery.  OTHER INSTRUCTIONS:   Knee Replacement:  Do not place pillow under knee, focus on keeping the knee straight while resting. CPM instructions: 0-90 degrees, 2 hours in the morning, 2 hours in the afternoon, and 2 hours in the evening. Place foam block, curve side up under heel at all times except when in CPM or when walking.  DO NOT modify, tear, cut, or change the foam block in any way.   MAKE SURE YOU:  Understand these instructions.  Get help right away if you are not doing well or get worse.    Thank you for letting us  be a part of your medical care team.  It is a privilege we respect greatly.  We hope these instructions will help you stay on track for a fast and full recovery!   Diagnostic Studies: DG Knee Right Port Result Date: 08/02/2024 EXAM: 1 or 2 VIEW(S) XRAY OF  THE RIGHT KNEE 08/02/2024 03:02:00 PM COMPARISON: None available. CLINICAL HISTORY: S/P total knee arthroplasty, right. FINDINGS: BONES AND JOINTS: Right total knee arthroplasty in place. Gas in joint space compatible with immediate postoperative state. No acute fracture. No focal osseous lesion. No joint dislocation. No significant joint effusion. SOFT TISSUES: Gas in overlying soft tissues compatible with immediate postoperative state. IMPRESSION: 1. Right total knee arthroplasty with expected immediate postoperative appearance. 2. Intra-articular and soft tissue gas compatible with immediate postoperative state. Electronically signed by: Waddell Calk MD 08/02/2024 03:26 PM EDT RP Workstation: [id number]    Disposition: Discharge disposition: 01-Home or Self Care       Discharge Instructions     Call MD / Call 911   Complete by: As directed    If you experience chest pain or shortness of breath, CALL 911 and be transported to the hospital emergency room.  If you develope a fever above 101 F, pus (white drainage) or increased drainage or redness at the wound, or calf pain, call your  surgeon's office.   Change dressing   Complete by: As directed    Do not remove your dressing.   Constipation Prevention   Complete by: As directed    Drink plenty of fluids.  Prune juice may be helpful.  You may use a stool softener, such as Colace (over the counter) 100 mg twice a day.  Use MiraLax  (over the counter) for constipation as needed.   Diet - low sodium heart healthy   Complete by: As directed    Discharge instructions   Complete by: As directed    Elevate toes above nose. Use cryotherapy as needed for pain and swelling.   Do not put a pillow under the knee. Place it under the heel.   Complete by: As directed    Driving restrictions   Complete by: As directed    No driving for 6 weeks   Increase activity slowly as tolerated   Complete by: As directed    Lifting restrictions   Complete by: As directed    No lifting for 6 weeks   Post-operative opioid taper instructions:   Complete by: As directed    POST-OPERATIVE OPIOID TAPER INSTRUCTIONS: It is important to wean off of your opioid medication as soon as possible. If you do not need pain medication after your surgery it is ok to stop day one. Opioids include: Codeine, Hydrocodone (Norco, Vicodin), Oxycodone (Percocet, oxycontin ) and hydromorphone  amongst others.  Long term and even short term use of opiods can cause: Increased pain response Dependence Constipation Depression Respiratory depression And more.  Withdrawal symptoms can include Flu like symptoms Nausea, vomiting And more Techniques to manage these symptoms Hydrate well Eat regular healthy meals Stay active Use relaxation techniques(deep breathing, meditating, yoga) Do Not substitute Alcohol  to help with tapering If you have been on opioids for less than two weeks and do not have pain than it is ok to stop all together.  Plan to wean off of opioids This plan should start within one week post op of your joint replacement. Maintain the same interval  or time between taking each dose and first decrease the dose.  Cut the total daily intake of opioids by one tablet each day Next start to increase the time between doses. The last dose that should be eliminated is the evening dose.      TED hose   Complete by: As directed    Use stockings (TED hose) for 2 weeks on both leg(s).  You may remove them at night for sleeping.   Weight bearing as tolerated   Complete by: As directed         Follow-up Information     Leigh Valery RAMAN, PA-C. Schedule an appointment as soon as possible for a visit in 2 week(s).   Specialty: Orthopedic Surgery Why: For suture removal, For wound re-check Contact information: [address], [address] [place] [address] [phone]                  Signed: Valery RAMAN Leigh 08/03/2024, 4:54 PM

## 2024-08-03 NOTE — Progress Notes (Signed)
 Physical Therapy Treatment Patient Details Name: [name] MRN: [id number] DOB: 21-May-1961 Today's Date: 08/03/2024   History of Present Illness [name] is a 63 y.o. female s/p R TKA 08/02/24. PMH: anxiety, arthritis, breast CA s/p bil mastectomy, depression, fibromyalgia, hypothyroidism, narcolepsy, L TKA 09/15/22    PT Comments  Pt with significant improvement in cognition this session, reports feeling out of it this morning from lack of sleep but normal now. Pt continues to demonstrate significantly decreased cadence, slow step to pattern with decreased RLE stance time and weightbearing, decreased R knee flexion in swing phase, increased BUE weightbearing and grip on RW, but with good steadiness and no knee buckling or near falls. Pt limited in gait distance due to UE/hand fatigue and pain from increased use on RW. Pt amb 60 ft then additional 20 ft with RW, supv for safety. Pt able to complete stair training with spouse present, CGA progressing to supv. Pt and spouse report confidence in mobility, all questions answered, provided written/illustrated HEP. Pt reports ready to d/c home with spouse support, starts OPPT Tuesday.    If plan is discharge home, recommend the following: A little help with walking and/or transfers;A little help with bathing/dressing/bathroom;Assistance with cooking/housework;Assist for transportation;Help with stairs or ramp for entrance   Can travel by private vehicle        Equipment Recommendations  None recommended by PT    Recommendations for Other Services       Precautions / Restrictions Precautions Precautions: Fall;Knee Precaution/Restrictions Comments: monitor SPO2 Restrictions Weight Bearing Restrictions Per Provider Order: No     Mobility  Bed Mobility Overal bed mobility: Needs Assistance Bed Mobility: Supine to Sit     Supine to sit: Supervision     General bed mobility comments: increased time and effort, no assist or  cues    Transfers Overall transfer level: Needs assistance Equipment used: Rolling walker (2 wheels) Transfers: Sit to/from Stand Sit to Stand: Supervision           General transfer comment: supv for transfers, cues for hand placement to power to stand, slight weightshift to LLE wih cues for equal weightbearing as tolerable    Ambulation/Gait Ambulation/Gait assistance: Supervision Gait Distance (Feet): 60 Feet (+20) Assistive device: Rolling walker (2 wheels) Gait Pattern/deviations: Step-to pattern, Decreased stride length, Decreased stance time - right, Decreased weight shift to right Gait velocity: decreased     General Gait Details: step-to gait pattern, decreased cadence, decreased RLE stance time and weightbearin, decreased R knee flexion in swing, limited by BUE fatigue/pain in hands from increased weightbearing on RW   Stairs Stairs: Yes Stairs assistance: Contact guard assist, Supervision Stair Management: Two rails, Step to pattern Number of Stairs: 4 General stair comments: spouse present reporting pt has to ascend 4 steps with bil handrail for home entry, pt able to complete 4 steps wtih bil handrail, CGA progressing to supv, completing 2 steps with therapist and 2 steps with spouse, reports confidence in tehcnique and declines further training   Wheelchair Mobility     Tilt Bed    Modified Rankin (Stroke Patients Only)       Balance Overall balance assessment: Needs assistance Sitting-balance support: Feet supported Sitting balance-Leahy Scale: Good     Standing balance support: Reliant on assistive device for balance, During functional activity, Bilateral upper extremity supported Standing balance-Leahy Scale: Poor  Communication Communication Communication: No apparent difficulties  Cognition Arousal: Alert Behavior During Therapy: WFL for tasks assessed/performed   PT - Cognitive impairments: No  apparent impairments                       PT - Cognition Comments: pt reports feeling out of it this morning from lack of sleep, alert and conversational this session, able to follow commands appropriately, responds to questions without delay, spouse present also reports feeling like pt was out of it this morning on the phone and appears to be at baseline cognitively this afternoon Following commands: Intact Following commands impaired: Follows one step commands with increased time    Cueing Cueing Techniques: Verbal cues, Gestural cues, Tactile cues, Visual cues  Exercises Total Joint Exercises Ankle Circles/Pumps: AROM, Both, 10 reps, Supine Quad Sets: AROM, Right, 5 reps, Supine    General Comments General comments (skin integrity, edema, etc.): Pt SpO2 89-94% on RA - RN notified      Pertinent Vitals/Pain Pain Assessment Pain Assessment: 0-10 Pain Score: 7  Pain Location: R knee and hands from RW Pain Descriptors / Indicators: Discomfort, Sore Pain Intervention(s): Limited activity within patient's tolerance, Monitored during session, Repositioned, Ice applied    Home Living                          Prior Function            PT Goals (current goals can now be found in the care plan section) Acute Rehab PT Goals Patient Stated Goal: return home PT Goal Formulation: With patient Time For Goal Achievement: 08/17/24 Potential to Achieve Goals: Good Progress towards PT goals: Progressing toward goals    Frequency    7X/week      PT Plan      Co-evaluation              AM-PAC PT 6 Clicks Mobility   Outcome Measure  Help needed turning from your back to your side while in a flat bed without using bedrails?: A Little Help needed moving from lying on your back to sitting on the side of a flat bed without using bedrails?: A Little Help needed moving to and from a bed to a chair (including a wheelchair)?: A Little Help needed standing up  from a chair using your arms (e.g., wheelchair or bedside chair)?: A Little Help needed to walk in hospital room?: A Little Help needed climbing 3-5 steps with a railing? : A Little 6 Click Score: 18    End of Session Equipment Utilized During Treatment: Gait belt;Oxygen Activity Tolerance: Patient tolerated treatment well Patient left: with call bell/phone within reach;in chair;with chair alarm set Nurse Communication: Mobility status;Other (comment) (SpO2) PT Visit Diagnosis: Muscle weakness (generalized) (M62.81);Difficulty in walking, not elsewhere classified (R26.2);Pain Pain - Right/Left: Right Pain - part of body: Knee     Time: 1420-1505 PT Time Calculation (min) (ACUTE ONLY): 45 min  Charges:    $Gait Training: 23-37 mins $Therapeutic Activity: 8-22 mins PT General Charges $$ ACUTE PT VISIT: 1 Visit                     Tori Annetta Deiss PT, DPT 08/03/24, 3:19 PM

## 2024-08-04 LAB — POCT I-STAT EG7
Acid-Base Excess: 2 mmol/L (ref 0.0–2.0)
Bicarbonate: 30.1 mmol/L — ABNORMAL HIGH (ref 20.0–28.0)
Calcium, Ion: 1.25 mmol/L (ref 1.15–1.40)
HCT: 35 % — ABNORMAL LOW (ref 36.0–46.0)
Hemoglobin: 11.9 g/dL — ABNORMAL LOW (ref 12.0–15.0)
O2 Saturation: 91 %
Potassium: 4.2 mmol/L (ref 3.5–5.1)
Sodium: 139 mmol/L (ref 135–145)
TCO2: 32 mmol/L (ref 22–32)
pCO2, Ven: 66.1 mmHg — ABNORMAL HIGH (ref 44–60)
pH, Ven: 7.265 (ref 7.25–7.43)
pO2, Ven: 71 mmHg — ABNORMAL HIGH (ref 32–45)

## 2024-08-06 ENCOUNTER — Encounter (HOSPITAL_COMMUNITY): Payer: Self-pay | Admitting: Orthopedic Surgery

## 2024-08-06 NOTE — Therapy (Unsigned)
 OUTPATIENT PHYSICAL THERAPY LOWER EXTREMITY EVALUATION   Patient Name: [name] MRN: [id number] DOB:July 02, 1961, 63 y.o., female Today's Date: 08/06/2024  END OF SESSION:   Past Medical History:  Diagnosis Date   Anxiety    Arthritis    Breast cancer (HCC)    Status post bilateral mastectomy   Breast reconstruction deformity    Complication of anesthesia    shaking-after mastectomies-resp and hr dropped   Depression, major    suicidal thoughts -implants, mental health, 05/11   Dyspnea    Fibromyalgia    GERD (gastroesophageal reflux disease)    Hypothyroidism    Narcolepsy    OSA on CPAP    PONV (postoperative nausea and vomiting)    Sleep apnea with use of continuous positive airway pressure (CPAP)    AHI 20 in HST, titration to 6 cm water  - residual AHi 6 . remaining hypersomnic.    Past Surgical History:  Procedure Laterality Date   ARTHRODESIS METATARSALPHALANGEAL JOINT (MTPJ) Right 07/28/2017   Procedure: ARTHRODESIS  RIGHT HALLUX METATARSALPHALANGEAL JOINT (MTPJ);  Surgeon: Kit Rush, MD;  Location: [facility];  Service: Orthopedics;  Laterality: Right;   Breast reconstructioin  7/09   flaps-bilat mastectomies   BTL     CARPAL TUNNEL RELEASE  2011   both hands   CARPOMETACARPEL SUSPENSION PLASTY Right 08/29/2014   Procedure: REMOVAL DERMASPAN SUSPENSIONPLASTY RIGHT THUMB ABDUCTOR POLLIS TRANSFER;  Surgeon: Arley Curia, MD;  Location: [facility];  Service: Orthopedics;  Laterality: Right;   CATARACT EXTRACTION W/PHACO Right 12/17/2019   Procedure: CATARACT EXTRACTION PHACO AND INTRAOCULAR LENS PLACEMENT (IOC);  Surgeon: Harrie Agent, MD;  Location: [facility];  Service: Ophthalmology;  Laterality: Right;  CDE: 6.54   CATARACT EXTRACTION W/PHACO Left 12/31/2019   Procedure: CATARACT EXTRACTION PHACO AND INTRAOCULAR LENS PLACEMENT (IOC);  Surgeon: Harrie Agent, MD;  Location: [facility];  Service: Ophthalmology;  Laterality: Left;  CDE:  3.66   COLONOSCOPY W/ POLYPECTOMY  4/03   tubulovillous adenoma   FINGER ARTHROSCOPY WITH CARPOMETACARPEL (CMC) ARTHROPLASTY Right 06/20/2014   Procedure: ARTHROSCOPY RIGHT THUMB CARPOMETACARPEL GRAFT JACKET INTERPOSITION PARTIAL TRAPEZIECTOMY POSSIBLE OPEN;  Surgeon: Arley Curia, MD;  Location: [facility];  Service: Orthopedics;  Laterality: Right;   GANGLION CYST EXCISION     both [phone]   HAMMER TOE SURGERY Right 07/28/2017   Procedure: THIRD HAMMERTOE CORRECTION;  Surgeon: Kit Rush, MD;  Location: [facility];  Service: Orthopedics;  Laterality: Right;   HARDWARE REMOVAL Right 07/28/2017   Procedure: REMOVAL OF DEEP IMPLANTS;  Surgeon: Kit Rush, MD;  Location: [facility];  Service: Orthopedics;  Laterality: Right;   hysterestomy  2005   KNEE ARTHROPLASTY Left 09/15/2022   Procedure: COMPUTER ASSISTED TOTAL KNEE ARTHROPLASTY;  Surgeon: Fidel Rogue, MD;  Location: [facility];  Service: Orthopedics;  Laterality: Left;   KNEE ARTHROPLASTY Right 08/02/2024   Procedure: ARTHROPLASTY, KNEE, TOTAL, USING IMAGELESS COMPUTER-ASSISTED NAVIGATION;  Surgeon: Fidel Rogue, MD;  Location: [facility];  Service: Orthopedics;  Laterality: Right;   MASTECTOMY Bilateral 2008   post- a -cath  2008   right knee arthroscopy     TOTAL SHOULDER ARTHROPLASTY Left 01/13/2017   Procedure: LEFT TOTAL SHOULDER ARTHROPLASTY;  Surgeon: Eva Herring, MD;  [facility]: [facility];  Service: Orthopedics;  Laterality: Left;  LEFT TOTAL SHOULDER ARTHROPLASTY   TOTAL SHOULDER ARTHROPLASTY Right 02/11/2022   Procedure: TOTAL SHOULDER ARTHROPLASTY;  Surgeon: Sharl Selinda Dover, MD;  Location: [facility];  Service: Orthopedics;  Laterality: Right;  150   TRIGGER FINGER RELEASE Right 08/29/2014   Procedure: RELEASE TRIGGER FINGER/A-1 PULLEY;  Surgeon: Arley Curia, MD;  Location: [facility];  Service: Orthopedics;  Laterality: Right;   wisdom teeth out     Patient Active  Problem List   Diagnosis Date Noted   Osteoarthritis of right knee 08/02/2024   S/P total knee arthroplasty, right 08/02/2024   Osteoarthritis of left knee 09/15/2022   History of total shoulder replacement, right 02/11/2022   Osteoarthritis of left shoulder 01/13/2017   S/P shoulder replacement, left 01/13/2017   OSA on CPAP 04/01/2015   Subacute confusional state 04/01/2015   Hallucinations, visual 04/01/2015   Hypnapompic hallucinations 04/01/2015   Sleep apnea with use of continuous positive airway pressure (CPAP)    Hypersomnia, persistent 03/30/2013   Obstructive sleep apnea (adult) (pediatric) 03/30/2013   Constipation 11/18/2008   RECTAL BLEEDING 11/18/2008   Internal hemorrhoids 11/15/2008   Diverticulosis of colon 11/15/2008   CELLULITIS/ABSCESS, TRUNK 02/22/2008   LOW BACK PAIN 02/22/2008   DIZZINESS 02/22/2008   FATIGUE 02/22/2008   PALPITATIONS 02/22/2008   MURMUR, CARDIAC, UNDIAGNOSED 02/22/2008   BREAST CANCER, HX OF 02/22/2008   COUGH 01/05/2008   DEPRESSION 07/05/2007   UTERINE POLYP 07/05/2007   FIBROMYALGIA 07/05/2007   History of colonic polyps 07/05/2007   HX, PERSONAL, CERVICAL DYSPLASIA 07/05/2007    PCP: Loreli Elsie JONETTA Mickey, MD  REFERRING PROVIDER: Leigh Valery RAMAN, PA-C  REFERRING DIAG:  M17.11 (ICD-10-CM) - Unilateral primary osteoarthritis, right knee    THERAPY DIAG:  No diagnosis found.  Rationale for Evaluation and Treatment: Rehabilitation  ONSET DATE: 08/02/24  SUBJECTIVE:   SUBJECTIVE STATEMENT: ***  PERTINENT HISTORY: [name] is an 63 y.o. female who was admitted 08/02/2024 with a diagnosis of Osteoarthritis of right knee and went to the operating room on 08/02/2024 and underwent the above named procedures.   PAIN:  Are you having pain? {OPRCPAIN:27236}  PRECAUTIONS: {Therapy precautions:24002}  RED FLAGS: {PT Red Flags:29287}   WEIGHT BEARING RESTRICTIONS: {Yes ***/No:24003}  FALLS:  Has patient fallen in last 6  months? {fallsyesno:27318}  LIVING ENVIRONMENT: Lives with: {OPRC lives with:25569::lives with their family} Lives in: {Lives in:25570} Stairs: {opstairs:27293} Has following equipment at home: {Assistive devices:23999}  OCCUPATION: ***  PLOF: {PLOF:24004}  PATIENT GOALS: ***  NEXT MD VISIT: ***  OBJECTIVE:  Note: Objective measures were completed at Evaluation unless otherwise noted.  DIAGNOSTIC FINDINGS:  IMPRESSION: 1. Right total knee arthroplasty with expected immediate postoperative appearance. 2. Intra-articular and soft tissue gas compatible with immediate postoperative state.  PATIENT SURVEYS:  LEFS  Extreme difficulty/unable (0), Quite a bit of difficulty (1), Moderate difficulty (2), Little difficulty (3), No difficulty (4) Survey date:    Any of your usual work, housework or school activities   2. Usual hobbies, recreational or sporting activities   3. Getting into/out of the bath   4. Walking between rooms   5. Putting on socks/shoes   6. Squatting    7. Lifting an object, like a bag of groceries from the floor   8. Performing light activities around your home   9. Performing heavy activities around your home   10. Getting into/out of a car   11. Walking 2 blocks   12. Walking 1 mile   13. Going up/down 10 stairs (1 flight)   14. Standing for 1 hour   15.  sitting for 1 hour   16. Running on even ground   17.  Running on uneven ground   18. Making sharp turns while running fast   19. Hopping    20. Rolling over in bed   Score total:  ***     COGNITION: Overall cognitive status: {cognition:24006}     SENSATION: {sensation:27233}  EDEMA:  {edema:24020}  MUSCLE LENGTH: Hamstrings: Right *** deg; Left *** deg Debby test: Right *** deg; Left *** deg  POSTURE: {posture:25561}  PALPATION: ***  LOWER EXTREMITY ROM:  {AROM/PROM:27142} ROM Right eval Left eval  Hip flexion    Hip extension    Hip abduction    Hip adduction    Hip  internal rotation    Hip external rotation    Knee flexion    Knee extension    Ankle dorsiflexion    Ankle plantarflexion    Ankle inversion    Ankle eversion     (Blank rows = not tested)  LOWER EXTREMITY MMT:  MMT Right eval Left eval  Hip flexion    Hip extension    Hip abduction    Hip adduction    Hip internal rotation    Hip external rotation    Knee flexion    Knee extension    Ankle dorsiflexion    Ankle plantarflexion    Ankle inversion    Ankle eversion     (Blank rows = not tested)  LOWER EXTREMITY SPECIAL TESTS:  {LEspecialtests:26242}  FUNCTIONAL TESTS:  {Functional tests:24029}  GAIT: Distance walked: *** Assistive device utilized: {Assistive devices:23999} Level of assistance: {Levels of assistance:24026} Comments: ***                                                                                                                                TREATMENT DATE: ***    PATIENT EDUCATION:  Education details: *** Person educated: {Person educated:25204} Education method: {Education Method:25205} Education comprehension: {Education Comprehension:25206}  HOME EXERCISE PROGRAM: ***  ASSESSMENT:  CLINICAL IMPRESSION: Patient is a *** y.o. *** who was seen today for physical therapy evaluation and treatment for ***.   OBJECTIVE IMPAIRMENTS: {opptimpairments:25111}.   ACTIVITY LIMITATIONS: {activitylimitations:27494}  PARTICIPATION LIMITATIONS: {participationrestrictions:25113}  PERSONAL FACTORS: {Personal factors:25162} are also affecting patient's functional outcome.   REHAB POTENTIAL: {rehabpotential:25112}  CLINICAL DECISION MAKING: {clinical decision making:25114}  EVALUATION COMPLEXITY: {Evaluation complexity:25115}   GOALS: Goals reviewed with patient? {yes/no:20286}  SHORT TERM GOALS: Target date: *** *** Baseline: Goal status: INITIAL  2.  *** Baseline:  Goal status: INITIAL  3.  *** Baseline:  Goal status:  INITIAL  4.  *** Baseline:  Goal status: INITIAL  5.  *** Baseline:  Goal status: INITIAL  6.  *** Baseline:  Goal status: INITIAL  LONG TERM GOALS: Target date: ***  *** Baseline:  Goal status: INITIAL  2.  *** Baseline:  Goal status: INITIAL  3.  *** Baseline:  Goal status: INITIAL  4.  *** Baseline:  Goal status: INITIAL  5.  *** Baseline:  Goal  status: INITIAL  6.  *** Baseline:  Goal status: INITIAL   PLAN:  PT FREQUENCY: 2x/week  PT DURATION: 8 weeks  PLANNED INTERVENTIONS: {rehab planned interventions:25118::97110-Therapeutic exercises,97530- Therapeutic [phone]- Neuromuscular re-education,97535- Self Rjmz,02859- Manual therapy,Patient/Family education}  PLAN FOR NEXT SESSION: Review HEP and goals,   6:14 PM, 08/06/24 Oneal Schoenberger Powell-Butler, PT, DPT [facility]

## 2024-08-07 ENCOUNTER — Ambulatory Visit (HOSPITAL_COMMUNITY)

## 2024-08-10 ENCOUNTER — Ambulatory Visit (HOSPITAL_COMMUNITY)

## 2024-08-10 NOTE — Therapy (Incomplete)
 OUTPATIENT PHYSICAL THERAPY LOWER EXTREMITY EVALUATION   Patient Name: [name] MRN: [id number] DOB:10-31-1961, 63 y.o., female Today's Date: 08/10/2024  END OF SESSION:   Past Medical History:  Diagnosis Date   Anxiety    Arthritis    Breast cancer (HCC)    Status post bilateral mastectomy   Breast reconstruction deformity    Complication of anesthesia    shaking-after mastectomies-resp and hr dropped   Depression, major    suicidal thoughts -implants, mental health, 05/11   Dyspnea    Fibromyalgia    GERD (gastroesophageal reflux disease)    Hypothyroidism    Narcolepsy    OSA on CPAP    PONV (postoperative nausea and vomiting)    Sleep apnea with use of continuous positive airway pressure (CPAP)    AHI 20 in HST, titration to 6 cm water  - residual AHi 6 . remaining hypersomnic.    Past Surgical History:  Procedure Laterality Date   ARTHRODESIS METATARSALPHALANGEAL JOINT (MTPJ) Right 07/28/2017   Procedure: ARTHRODESIS  RIGHT HALLUX METATARSALPHALANGEAL JOINT (MTPJ);  Surgeon: Kit Rush, MD;  Location: [facility];  Service: Orthopedics;  Laterality: Right;   Breast reconstructioin  7/09   flaps-bilat mastectomies   BTL     CARPAL TUNNEL RELEASE  2011   both hands   CARPOMETACARPEL SUSPENSION PLASTY Right 08/29/2014   Procedure: REMOVAL DERMASPAN SUSPENSIONPLASTY RIGHT THUMB ABDUCTOR POLLIS TRANSFER;  Surgeon: Arley Curia, MD;  Location: [facility];  Service: Orthopedics;  Laterality: Right;   CATARACT EXTRACTION W/PHACO Right 12/17/2019   Procedure: CATARACT EXTRACTION PHACO AND INTRAOCULAR LENS PLACEMENT (IOC);  Surgeon: Harrie Agent, MD;  Location: [facility];  Service: Ophthalmology;  Laterality: Right;  CDE: 6.54   CATARACT EXTRACTION W/PHACO Left 12/31/2019   Procedure: CATARACT EXTRACTION PHACO AND INTRAOCULAR LENS PLACEMENT (IOC);  Surgeon: Harrie Agent, MD;  Location: [facility];  Service: Ophthalmology;  Laterality: Left;  CDE:  3.66   COLONOSCOPY W/ POLYPECTOMY  4/03   tubulovillous adenoma   FINGER ARTHROSCOPY WITH CARPOMETACARPEL (CMC) ARTHROPLASTY Right 06/20/2014   Procedure: ARTHROSCOPY RIGHT THUMB CARPOMETACARPEL GRAFT JACKET INTERPOSITION PARTIAL TRAPEZIECTOMY POSSIBLE OPEN;  Surgeon: Arley Curia, MD;  Location: [facility];  Service: Orthopedics;  Laterality: Right;   GANGLION CYST EXCISION     both [id number]   HAMMER TOE SURGERY Right 07/28/2017   Procedure: THIRD HAMMERTOE CORRECTION;  Surgeon: Kit Rush, MD;  Location: [facility];  Service: Orthopedics;  Laterality: Right;   HARDWARE REMOVAL Right 07/28/2017   Procedure: REMOVAL OF DEEP IMPLANTS;  Surgeon: Kit Rush, MD;  Location: [facility];  Service: Orthopedics;  Laterality: Right;   hysterestomy  2005   KNEE ARTHROPLASTY Left 09/15/2022   Procedure: COMPUTER ASSISTED TOTAL KNEE ARTHROPLASTY;  Surgeon: Fidel Rogue, MD;  Location: [facility];  Service: Orthopedics;  Laterality: Left;   KNEE ARTHROPLASTY Right 08/02/2024   Procedure: ARTHROPLASTY, KNEE, TOTAL, USING IMAGELESS COMPUTER-ASSISTED NAVIGATION;  Surgeon: Fidel Rogue, MD;  Location: [facility];  Service: Orthopedics;  Laterality: Right;   MASTECTOMY Bilateral 2008   post- a -cath  2008   right knee arthroscopy     TOTAL SHOULDER ARTHROPLASTY Left 01/13/2017   Procedure: LEFT TOTAL SHOULDER ARTHROPLASTY;  Surgeon: Eva Herring, MD;  Location: [facility];  Service: Orthopedics;  Laterality: Left;  LEFT TOTAL SHOULDER ARTHROPLASTY   TOTAL SHOULDER ARTHROPLASTY Right 02/11/2022   Procedure: TOTAL SHOULDER ARTHROPLASTY;  Surgeon: Sharl Selinda Dover, MD;  Location: [facility];  Service: Orthopedics;  Laterality: Right;  150   TRIGGER FINGER RELEASE Right 08/29/2014   Procedure: RELEASE TRIGGER FINGER/A-1 PULLEY;  Surgeon: Arley Curia, MD;  Location: [facility];  Service: Orthopedics;  Laterality: Right;   wisdom teeth out     Patient Active  Problem List   Diagnosis Date Noted   Osteoarthritis of right knee 08/02/2024   S/P total knee arthroplasty, right 08/02/2024   Osteoarthritis of left knee 09/15/2022   History of total shoulder replacement, right 02/11/2022   Osteoarthritis of left shoulder 01/13/2017   S/P shoulder replacement, left 01/13/2017   OSA on CPAP 04/01/2015   Subacute confusional state 04/01/2015   Hallucinations, visual 04/01/2015   Hypnapompic hallucinations 04/01/2015   Sleep apnea with use of continuous positive airway pressure (CPAP)    Hypersomnia, persistent 03/30/2013   Obstructive sleep apnea (adult) (pediatric) 03/30/2013   Constipation 11/18/2008   RECTAL BLEEDING 11/18/2008   Internal hemorrhoids 11/15/2008   Diverticulosis of colon 11/15/2008   CELLULITIS/ABSCESS, TRUNK 02/22/2008   LOW BACK PAIN 02/22/2008   DIZZINESS 02/22/2008   FATIGUE 02/22/2008   PALPITATIONS 02/22/2008   MURMUR, CARDIAC, UNDIAGNOSED 02/22/2008   BREAST CANCER, HX OF 02/22/2008   COUGH 01/05/2008   DEPRESSION 07/05/2007   UTERINE POLYP 07/05/2007   FIBROMYALGIA 07/05/2007   History of colonic polyps 07/05/2007   HX, PERSONAL, CERVICAL DYSPLASIA 07/05/2007    PCP: Loreli Elsie JONETTA Mickey., MD   REFERRING PROVIDER: Leigh Valery RAMAN, PA-C  REFERRING DIAG: M17.11 (ICD-10-CM) - Unilateral primary osteoarthritis, right knee  THERAPY DIAG:  No diagnosis found.  Rationale for Evaluation and Treatment: Rehabilitation  ONSET DATE: ***  SUBJECTIVE:   SUBJECTIVE STATEMENT: ***  PERTINENT HISTORY: *** PAIN:  Are you having pain? {OPRCPAIN:27236}  PRECAUTIONS: {Therapy precautions:24002}  RED FLAGS: {PT Red Flags:29287}   WEIGHT BEARING RESTRICTIONS: {Yes ***/No:24003}  FALLS:  Has patient fallen in last 6 months? {fallsyesno:27318}  LIVING ENVIRONMENT: Lives with: {OPRC lives with:25569::lives with their family} Lives in: {Lives in:25570} Stairs: {opstairs:27293} Has following equipment at home:  {Assistive devices:23999}  OCCUPATION: ***  PLOF: {PLOF:24004}  PATIENT GOALS: ***  NEXT MD VISIT: ***  OBJECTIVE:  Note: Objective measures were completed at Evaluation unless otherwise noted.  DIAGNOSTIC FINDINGS: EXAM: 1 or 2 VIEW(S) XRAY OF THE RIGHT KNEE 08/02/2024 03:02:00 PM   COMPARISON: None available.   CLINICAL HISTORY: S/P total knee arthroplasty, right.   FINDINGS:   BONES AND JOINTS: Right total knee arthroplasty in place. Gas in joint space compatible with immediate postoperative state. No acute fracture. No focal osseous lesion. No joint dislocation. No significant joint effusion.   SOFT TISSUES: Gas in overlying soft tissues compatible with immediate postoperative state.   IMPRESSION: 1. Right total knee arthroplasty with expected immediate postoperative appearance. 2. Intra-articular and soft tissue gas compatible with immediate postoperative state.  PATIENT SURVEYS:  LEFS {:PHR,OPRCLEFSTABLE}   COGNITION: Overall cognitive status: {cognition:24006}     SENSATION: {sensation:27233}  EDEMA:  {edema:24020}  MUSCLE LENGTH: Hamstrings: Right *** deg; Left *** deg Debby test: Right *** deg; Left *** deg  POSTURE: {posture:25561}  PALPATION: ***  LOWER EXTREMITY ROM:  {AROM/PROM:27142} ROM Right eval Left eval  Hip flexion    Hip extension    Hip abduction    Hip adduction    Hip internal rotation    Hip external rotation    Knee flexion    Knee extension    Ankle dorsiflexion    Ankle plantarflexion    Ankle inversion  Ankle eversion     (Blank rows = not tested)  LOWER EXTREMITY MMT:  MMT Right eval Left eval  Hip flexion    Hip extension    Hip abduction    Hip adduction    Hip internal rotation    Hip external rotation    Knee flexion    Knee extension    Ankle dorsiflexion    Ankle plantarflexion    Ankle inversion    Ankle eversion     (Blank rows = not tested)  LOWER EXTREMITY SPECIAL TESTS:   {LEspecialtests:26242}  FUNCTIONAL TESTS:  5 times sit to stand: *** 2 minute walk test: ***  GAIT: Distance walked: *** Assistive device utilized: {Assistive devices:23999} Level of assistance: {Levels of assistance:24026} Comments: ***                                                                                                                                TREATMENT DATE:  08/10/2024  Vitals: BP: HR: O2 sat: Evaluation: -ROM measured, Strength assessed, HEP prescribed, pt educated on prognosis, findings, and importance of HEP compliance if given.  Manual Therapy: -CPA of Lumbar Spinal segments L3-L5, grade II-III mobilizations -STM of Lumbar Paraspinal musculature  Therapeutic Exercise: -Supine bridges 2 sets of 10 reps, 3 second holds, symptomatic, pt cued for max hip extension -Standing 3 way hip 1 sets 10 reps, bilaterally, pt cued for upright trunk and maintaining of neutral spine -Lateral stepping 3 laps 20 feet per lap, second 2 with RTB around ankles, pt cued for upright posture -Forward lunges, 1 set of 5 reps better performance going into RLE, pt cued for core activation and upright posture       PATIENT EDUCATION:  Education details: Pt was educated on findings of PT evaluation, prognosis, frequency of therapy visits and rationale, attendance policy, and HEP if given.   Person educated: {Person educated:25204} Education method: {Education Method:25205} Education comprehension: {Education Comprehension:25206}  HOME EXERCISE PROGRAM: ***  ASSESSMENT:  CLINICAL IMPRESSION: Patient is a 63 y.o. female who was seen today for physical therapy evaluation and treatment for M17.11 (ICD-10-CM) - Unilateral primary osteoarthritis, right knee.  Patient demonstrates decreased LE strength, abnormal pain rating, and impaired balance. Patient also demonstrates difficulty with ambulation during today's session with decreased stride length and velocity noted. Patient  also demonstrates ***. Patient requires ***. Patient would benefit from skilled physical therapy for increased endurance with ambulation, increased LE strength, and balance for improved gait quality, return to higher level of function with ADLs, and progress towards therapy goals.    OBJECTIVE IMPAIRMENTS: {opptimpairments:25111}.   ACTIVITY LIMITATIONS: {activitylimitations:27494}  PARTICIPATION LIMITATIONS: {participationrestrictions:25113}  PERSONAL FACTORS: {Personal factors:25162} are also affecting patient's functional outcome.   REHAB POTENTIAL: {rehabpotential:25112}  CLINICAL DECISION MAKING: {clinical decision making:25114}  EVALUATION COMPLEXITY: {Evaluation complexity:25115}   GOALS: Goals reviewed with patient? {yes/no:20286}  SHORT TERM GOALS: Target date: ***  Patient will demonstrate evidence of independence with individualized  HEP and will report compliance for at least 3 days per week for optimized progression towards remaining therapy goals. Baseline: *** Goal status: {GOALSTATUS:25110}  2.  Patient will report a decrease in pain level during community ambulation by at least 2 points for improved quality of life. Baseline: *** Goal status: {GOALSTATUS:25110}     LONG TERM GOALS: Target date: ***  Pt will demonstrate a an increase of at least 9 points on the LEFS for improved performance of community ambulation and ADL. Baseline: *** Goal status: {GOALSTATUS:25110}  2.  Pt will improve 2 MWT by *** in order to demonstrate improved functional ambulatory capacity in community setting.  Baseline: *** Goal status: {GOALSTATUS:25110}  3.  Pt will demonstrate WFL ROM (flexion and extension) in right knee, for increased mobility and maximal efficiency of gait cycle during ambulation. Baseline: *** Goal status: {GOALSTATUS:25110}  4.  Pt will demonstrate at least 4-/5 MMT for right lower extremity for increased strength during ADL and community  ambulation. Baseline: *** Goal status: {GOALSTATUS:25110}  5.  Pt will improve *** by *** in order to improve *** during functional activities.. Baseline: *** Goal status: {GOALSTATUS:25110}    PLAN:  PT FREQUENCY: {rehab frequency:25116}  PT DURATION: {rehab duration:25117}  PLANNED INTERVENTIONS: {rehab planned interventions:25118::97110-Therapeutic exercises,97530- Therapeutic [phone]- Neuromuscular re-education,97535- Self Rjmz,02859- Manual therapy,Patient/Family education}  PLAN FOR NEXT SESSION: ***   Lang Ada, PT, DPT [facility] Office: [phone] 6:59 AM, 08/10/24

## 2024-08-13 ENCOUNTER — Encounter (HOSPITAL_COMMUNITY): Payer: Self-pay

## 2024-08-13 ENCOUNTER — Ambulatory Visit (HOSPITAL_COMMUNITY): Attending: Student

## 2024-08-13 DIAGNOSIS — R262 Difficulty in walking, not elsewhere classified: Secondary | ICD-10-CM | POA: Insufficient documentation

## 2024-08-13 DIAGNOSIS — M25561 Pain in right knee: Secondary | ICD-10-CM | POA: Insufficient documentation

## 2024-08-13 DIAGNOSIS — R600 Localized edema: Secondary | ICD-10-CM | POA: Insufficient documentation

## 2024-08-13 DIAGNOSIS — M25661 Stiffness of right knee, not elsewhere classified: Secondary | ICD-10-CM | POA: Insufficient documentation

## 2024-08-13 NOTE — Therapy (Signed)
 OUTPATIENT PHYSICAL THERAPY LOWER EXTREMITY EVALUATION   Patient Name: [name] MRN: [id number] DOB:02-05-1961, 63 y.o., female Today's Date: 08/13/2024  END OF SESSION:  PT End of Session - 08/13/24 0957     Visit Number 1    Number of Visits 14    Date for Recertification  10/05/24    PT Start Time [phone]   Patient arrived late to her appointment.   PT Stop Time 1030    PT Time Calculation (min) 31 min    Activity Tolerance Patient limited by pain    Behavior During Therapy WFL for tasks assessed/performed          Past Medical History:  Diagnosis Date   Anxiety    Arthritis    Breast cancer (HCC)    Status post bilateral mastectomy   Breast reconstruction deformity    Complication of anesthesia    shaking-after mastectomies-resp and hr dropped   Depression, major    suicidal thoughts -implants, mental health, 05/11   Dyspnea    Fibromyalgia    GERD (gastroesophageal reflux disease)    Hypothyroidism    Narcolepsy    OSA on CPAP    PONV (postoperative nausea and vomiting)    Sleep apnea with use of continuous positive airway pressure (CPAP)    AHI 20 in HST, titration to 6 cm water  - residual AHi 6 . remaining hypersomnic.    Past Surgical History:  Procedure Laterality Date   ARTHRODESIS METATARSALPHALANGEAL JOINT (MTPJ) Right 07/28/2017   Procedure: ARTHRODESIS  RIGHT HALLUX METATARSALPHALANGEAL JOINT (MTPJ);  Surgeon: Kit Rush, MD;  Location: [facility];  Service: Orthopedics;  Laterality: Right;   Breast reconstructioin  7/09   flaps-bilat mastectomies   BTL     CARPAL TUNNEL RELEASE  2011   both hands   CARPOMETACARPEL SUSPENSION PLASTY Right 08/29/2014   Procedure: REMOVAL DERMASPAN SUSPENSIONPLASTY RIGHT THUMB ABDUCTOR POLLIS TRANSFER;  Surgeon: Arley Curia, MD;  Location: [facility];  Service: Orthopedics;  Laterality: Right;   CATARACT EXTRACTION W/PHACO Right 12/17/2019   Procedure: CATARACT EXTRACTION PHACO AND  INTRAOCULAR LENS PLACEMENT (IOC);  Surgeon: Harrie Agent, MD;  Location: [facility];  Service: Ophthalmology;  Laterality: Right;  CDE: 6.54   CATARACT EXTRACTION W/PHACO Left 12/31/2019   Procedure: CATARACT EXTRACTION PHACO AND INTRAOCULAR LENS PLACEMENT (IOC);  Surgeon: Harrie Agent, MD;  Location: [facility];  Service: Ophthalmology;  Laterality: Left;  CDE: 3.66   COLONOSCOPY W/ POLYPECTOMY  4/03   tubulovillous adenoma   FINGER ARTHROSCOPY WITH CARPOMETACARPEL (CMC) ARTHROPLASTY Right 06/20/2014   Procedure: ARTHROSCOPY RIGHT THUMB CARPOMETACARPEL GRAFT JACKET INTERPOSITION PARTIAL TRAPEZIECTOMY POSSIBLE OPEN;  Surgeon: Arley Curia, MD;  Location: [facility];  Service: Orthopedics;  Laterality: Right;   GANGLION CYST EXCISION     both [phone]   HAMMER TOE SURGERY Right 07/28/2017   Procedure: THIRD HAMMERTOE CORRECTION;  Surgeon: Kit Rush, MD;  Location: [facility];  Service: Orthopedics;  Laterality: Right;   HARDWARE REMOVAL Right 07/28/2017   Procedure: REMOVAL OF DEEP IMPLANTS;  Surgeon: Kit Rush, MD;  Location: [facility];  Service: Orthopedics;  Laterality: Right;   hysterestomy  2005   KNEE ARTHROPLASTY Left 09/15/2022   Procedure: COMPUTER ASSISTED TOTAL KNEE ARTHROPLASTY;  Surgeon: Fidel Rogue, MD;  Location: [facility];  Service: Orthopedics;  Laterality: Left;   KNEE ARTHROPLASTY Right 08/02/2024   Procedure: ARTHROPLASTY, KNEE, TOTAL, USING IMAGELESS COMPUTER-ASSISTED NAVIGATION;  Surgeon: Fidel Rogue, MD;  Location: [facility];  Service: Orthopedics;  Laterality: Right;   MASTECTOMY Bilateral 2008   post- a -cath  2008   right knee arthroscopy     TOTAL SHOULDER ARTHROPLASTY Left 01/13/2017   Procedure: LEFT TOTAL SHOULDER ARTHROPLASTY;  Surgeon: Eva Herring, MD;  Location: [facility];  Service: Orthopedics;  Laterality: Left;  LEFT TOTAL SHOULDER ARTHROPLASTY   TOTAL SHOULDER ARTHROPLASTY Right 02/11/2022   Procedure: TOTAL SHOULDER  ARTHROPLASTY;  Surgeon: Sharl Selinda Dover, MD;  Location: [facility];  Service: Orthopedics;  Laterality: Right;  150   TRIGGER FINGER RELEASE Right 08/29/2014   Procedure: RELEASE TRIGGER FINGER/A-1 PULLEY;  Surgeon: Arley Curia, MD;  Location: [facility];  Service: Orthopedics;  Laterality: Right;   wisdom teeth out     Patient Active Problem List   Diagnosis Date Noted   Osteoarthritis of right knee 08/02/2024   S/P total knee arthroplasty, right 08/02/2024   Osteoarthritis of left knee 09/15/2022   History of total shoulder replacement, right 02/11/2022   Osteoarthritis of left shoulder 01/13/2017   S/P shoulder replacement, left 01/13/2017   OSA on CPAP 04/01/2015   Subacute confusional state 04/01/2015   Hallucinations, visual 04/01/2015   Hypnapompic hallucinations 04/01/2015   Sleep apnea with use of continuous positive airway pressure (CPAP)    Hypersomnia, persistent 03/30/2013   Obstructive sleep apnea (adult) (pediatric) 03/30/2013   Constipation 11/18/2008   RECTAL BLEEDING 11/18/2008   Internal hemorrhoids 11/15/2008   Diverticulosis of colon 11/15/2008   CELLULITIS/ABSCESS, TRUNK 02/22/2008   LOW BACK PAIN 02/22/2008   DIZZINESS 02/22/2008   FATIGUE 02/22/2008   PALPITATIONS 02/22/2008   MURMUR, CARDIAC, UNDIAGNOSED 02/22/2008   BREAST CANCER, HX OF 02/22/2008   COUGH 01/05/2008   DEPRESSION 07/05/2007   UTERINE POLYP 07/05/2007   FIBROMYALGIA 07/05/2007   History of colonic polyps 07/05/2007   HX, PERSONAL, CERVICAL DYSPLASIA 07/05/2007    PCP: Loreli Elsie JONETTA Mickey., MD  REFERRING PROVIDER: Leigh Valery RAMAN, PA-C  REFERRING DIAG: M17.11 (ICD-10-CM) - Unilateral primary osteoarthritis, right knee  THERAPY DIAG:  Acute pain of right knee  Stiffness of right knee, not elsewhere classified  Localized edema  Rationale for Evaluation and Treatment: Rehabilitation  ONSET DATE: 08/02/24  SUBJECTIVE:   SUBJECTIVE STATEMENT: Patient reports that  she had a right knee replacement on 08/02/24. She notes that it has been swelling up quite a bit and hurting since surgery. She was not given any exercise to do after surgery. She notes that her muscle relaxer's and pain medication are helping some.   PERTINENT HISTORY: OA, fibromyalgia, depression, history of cancer, and current smoker PAIN:  Are you having pain? Yes: NPRS scale: Current: 8/10 Best: 4/10 Worst: 8/10 Pain location: right knee and leg Pain description: burning, throbbing, and aching  Aggravating factors: bending her knee, movement Relieving factors: medication and ice  PRECAUTIONS: Fall  RED FLAGS: None   WEIGHT BEARING RESTRICTIONS: No  FALLS:  Has patient fallen in last 6 months? Yes. Number of falls 4-5; most recent was last week when she fell out of bed into the floor  LIVING ENVIRONMENT: Lives with: lives with their family Lives in: House/apartment Stairs: Yes: External: 8 steps; can reach both Has following equipment at home: Walker - 2 wheeled  OCCUPATION: not working  PLOF: Independent  PATIENT GOALS: improved mobility and reduced pain  NEXT MD VISIT: 08/20/24  OBJECTIVE:  Note: Objective measures were completed at Evaluation unless otherwise noted.  DIAGNOSTIC FINDINGS: EXAM: 1 or 2 VIEW(S) XRAY OF  THE RIGHT KNEE 08/02/2024 03:02:00 PM   COMPARISON: None available.   CLINICAL HISTORY: S/P total knee arthroplasty, right.   FINDINGS:   BONES AND JOINTS: Right total knee arthroplasty in place. Gas in joint space compatible with immediate postoperative state. No acute fracture. No focal osseous lesion. No joint dislocation. No significant joint effusion.   SOFT TISSUES: Gas in overlying soft tissues compatible with immediate postoperative state.   IMPRESSION: 1. Right total knee arthroplasty with expected immediate postoperative appearance. 2. Intra-articular and soft tissue gas compatible with immediate postoperative state.  PATIENT  SURVEYS:  LEFS: unable to assess due to time constraints  COGNITION: Overall cognitive status: Within functional limits for tasks assessed     SENSATION: WFL  EDEMA:  Circumferential: L tibiofemoral joint line: 40.5 cm R tibiofemoral joint line: 43.4   PALPATION: TTP: globally around right knee and lower leg  LOWER EXTREMITY ROM:  Active ROM Right eval Left eval  Hip flexion    Hip extension    Hip abduction    Hip adduction    Hip internal rotation    Hip external rotation    Knee flexion 47 PROM: 60 118  Knee extension 8 0  Ankle dorsiflexion    Ankle plantarflexion    Ankle inversion    Ankle eversion     (Blank rows = not tested)  LOWER EXTREMITY MMT: not tested due to surgical condition  MMT Right eval Left eval  Hip flexion    Hip extension    Hip abduction    Hip adduction    Hip internal rotation    Hip external rotation    Knee flexion    Knee extension    Ankle dorsiflexion    Ankle plantarflexion    Ankle inversion    Ankle eversion     (Blank rows = not tested)  FUNCTIONAL TESTS:  STS: avoids WB through RLE  GAIT: Assistive device utilized: Environmental consultant - 2 wheeled Level of assistance: Modified independence Comments: step through pattern with R knee flexed in stance phase                                                                                                                                 TREATMENT DATE:  08/12/24: PT evaluation, HEP, and education    PATIENT EDUCATION:  Education details: POC, healing, edema, HEP, and expectations following surgery  Person educated: Patient Education method: Explanation Education comprehension: verbalized understanding  HOME EXERCISE PROGRAM: Access Code: [email or web address] URL: [email or web address] Date: 08/13/2024 Prepared by: Lacinda Fass  Exercises - Supine Ankle Pumps  - 1 x daily - 7 x weekly - 2 sets - 10 reps - Supine Quad Set  - 1 x daily - 7 x weekly - 2 sets - 10 reps -  3-5 seconds hold - Supine Heel Slide  - 1 x daily - 7 x weekly - 2 sets - 10 reps  ASSESSMENT:  CLINICAL IMPRESSION:  Patient is a 63 y.o. female who was seen today for physical therapy evaluation and treatment following a total knee arthroplasty on 08/02/24. She presented with elevated right knee pain which was aggravated by right knee AROM. She exhibited increased right knee edema, but no signs or symptoms of a post operative complication. She was provided a HEP which she was able to properly demonstrate. She reported feeling comfortable with these interventions. Patient continues to require skilled physical therapy to address her remaining impairments to return to her prior level of function.      OBJECTIVE IMPAIRMENTS: Abnormal gait, decreased activity tolerance, decreased balance, decreased knowledge of condition, decreased mobility, difficulty walking, decreased ROM, decreased strength, hypomobility, increased edema, impaired flexibility, impaired tone, and pain.   ACTIVITY LIMITATIONS: lifting, sitting, standing, squatting, stairs, transfers, bed mobility, and locomotion level  PARTICIPATION LIMITATIONS: shopping and community activity  PERSONAL FACTORS: Past/current experiences, Time since onset of injury/illness/exacerbation, and 3+ comorbidities: OA, fibromyalgia, depression, history of cancer, and current smoker are also affecting patient's functional outcome.   REHAB POTENTIAL: Good  CLINICAL DECISION MAKING: Evolving/moderate complexity  EVALUATION COMPLEXITY: Moderate   GOALS: Goals reviewed with patient? No  SHORT TERM GOALS: Target date: 08/27/24  Patient will demonstrate evidence of independence with individualized HEP and will report compliance for at least 3 days per week for optimized progression towards remaining therapy goals. Baseline:  Goal status: INITIAL  2.  Patient will report a decrease in pain level during community ambulation by at least 2 points for  improved quality of life. Baseline:  Goal status: INITIAL     LONG TERM GOALS: Target date: 10/01/24  Pt will demonstrate a an increase of at least 9 points on the LEFS for improved performance of community ambulation and ADL. Baseline:  Goal status: INITIAL  2.  Pt will be able to navigate at least 3 steps with a reciprocal pattern for improved function with household mobility. Baseline:   Goal status: INITIAL  3.  Pt will ambulate with no significant gait deviations for improved community mobility.  Baseline:   Goal status: INITIAL  4.  Patient will improve her right knee flexion to at least 115 degrees for improved function navigating steps.  Baseline:   Goal status: INITIAL  5.  Patient will improve her right knee extension to within 5 degrees of neutral for improved gait mechanics.  Baseline:   Goal status: INITIAL    PLAN:  PT FREQUENCY: 1-2x/week  PT DURATION: other: 7 weeks  PLANNED INTERVENTIONS: [id number]- PT Re-evaluation, 97750- Physical Performance Testing, 97110-Therapeutic exercises, 97530- Therapeutic activity, [id number]- Neuromuscular re-education, 97535- Self Care, [id number]- Manual therapy, [phone]- Gait training, [phone]- Electrical stimulation (unattended), 97016- Vasopneumatic device, Patient/Family education, Balance training, Stair training, Joint mobilization, Cryotherapy, and Moist heat  PLAN FOR NEXT SESSION: review and update HEP (as needed), AROM and PROM right knee flexion, manual therapy, and modalities as needed    Lacinda Fass, PT, DPT  1:46 PM, 08/13/24

## 2024-08-14 ENCOUNTER — Encounter (HOSPITAL_COMMUNITY)

## 2024-08-16 ENCOUNTER — Encounter (HOSPITAL_COMMUNITY): Payer: Self-pay

## 2024-08-16 ENCOUNTER — Ambulatory Visit (HOSPITAL_COMMUNITY)

## 2024-08-16 DIAGNOSIS — R600 Localized edema: Secondary | ICD-10-CM

## 2024-08-16 DIAGNOSIS — M25661 Stiffness of right knee, not elsewhere classified: Secondary | ICD-10-CM

## 2024-08-16 DIAGNOSIS — M25561 Pain in right knee: Secondary | ICD-10-CM

## 2024-08-16 NOTE — Therapy (Signed)
 OUTPATIENT PHYSICAL THERAPY LOWER EXTREMITY TREATMENT   Patient Name: [name] MRN: [id number] DOB:[birth year], 63 y.o., female Today's Date: 08/16/2024  END OF SESSION:  PT End of Session - 08/16/24 1425     Visit Number 2    Number of Visits 14    Date for Recertification  10/05/24    PT Start Time 1425   Late arrival   PT Stop Time 1455    PT Time Calculation (min) 30 min    Activity Tolerance Patient limited by pain    Behavior During Therapy [facility] for tasks assessed/performed           Past Medical History:  Diagnosis Date   Anxiety    Arthritis    Breast cancer (HCC)    Status post bilateral mastectomy   Breast reconstruction deformity    Complication of anesthesia    shaking-after mastectomies-resp and hr dropped   Depression, major    suicidal thoughts -implants, mental health, 05/11   Dyspnea    Fibromyalgia    GERD (gastroesophageal reflux disease)    Hypothyroidism    Narcolepsy    OSA on CPAP    PONV (postoperative nausea and vomiting)    Sleep apnea with use of continuous positive airway pressure (CPAP)    AHI 20 in HST, titration to 6 cm water  - residual AHi 6 . remaining hypersomnic.    Past Surgical History:  Procedure Laterality Date   ARTHRODESIS METATARSALPHALANGEAL JOINT (MTPJ) Right 07/28/2017   Procedure: ARTHRODESIS  RIGHT HALLUX METATARSALPHALANGEAL JOINT (MTPJ);  Surgeon: Kit Rush, MD;  Location: [facility];  Service: Orthopedics;  Laterality: Right;   Breast reconstructioin  7/09   flaps-bilat mastectomies   BTL     CARPAL TUNNEL RELEASE  2011   both hands   CARPOMETACARPEL SUSPENSION PLASTY Right 08/29/2014   Procedure: REMOVAL DERMASPAN SUSPENSIONPLASTY RIGHT THUMB ABDUCTOR POLLIS TRANSFER;  Surgeon: Arley Curia, MD;  Location: [facility];  Service: Orthopedics;  Laterality: Right;   CATARACT EXTRACTION W/PHACO Right 12/17/2019   Procedure: CATARACT EXTRACTION PHACO AND INTRAOCULAR LENS PLACEMENT  (IOC);  Surgeon: Harrie Agent, MD;  Location: [facility];  Service: Ophthalmology;  Laterality: Right;  CDE: 6.54   CATARACT EXTRACTION W/PHACO Left 12/31/2019   Procedure: CATARACT EXTRACTION PHACO AND INTRAOCULAR LENS PLACEMENT (IOC);  Surgeon: Harrie Agent, MD;  Location: [facility];  Service: Ophthalmology;  Laterality: Left;  CDE: 3.66   COLONOSCOPY W/ POLYPECTOMY  4/03   tubulovillous adenoma   FINGER ARTHROSCOPY WITH CARPOMETACARPEL (CMC) ARTHROPLASTY Right 06/20/2014   Procedure: ARTHROSCOPY RIGHT THUMB CARPOMETACARPEL GRAFT JACKET INTERPOSITION PARTIAL TRAPEZIECTOMY POSSIBLE OPEN;  Surgeon: Arley Curia, MD;  Location: [facility];  Service: Orthopedics;  Laterality: Right;   GANGLION CYST EXCISION     both [id number]   HAMMER TOE SURGERY Right 07/28/2017   Procedure: THIRD HAMMERTOE CORRECTION;  Surgeon: Kit Rush, MD;  Location: [facility];  Service: Orthopedics;  Laterality: Right;   HARDWARE REMOVAL Right 07/28/2017   Procedure: REMOVAL OF DEEP IMPLANTS;  Surgeon: Kit Rush, MD;  Location: [facility];  Service: Orthopedics;  Laterality: Right;   hysterestomy  2005   KNEE ARTHROPLASTY Left 09/15/2022   Procedure: COMPUTER ASSISTED TOTAL KNEE ARTHROPLASTY;  Surgeon: Fidel Rogue, MD;  Location: [facility];  Service: Orthopedics;  Laterality: Left;   KNEE ARTHROPLASTY Right 08/02/2024   Procedure: ARTHROPLASTY, KNEE, TOTAL, USING IMAGELESS COMPUTER-ASSISTED NAVIGATION;  Surgeon: Fidel Rogue, MD;  Location: [facility];  Service:  Orthopedics;  Laterality: Right;   MASTECTOMY Bilateral 2008   post- a -cath  2008   right knee arthroscopy     TOTAL SHOULDER ARTHROPLASTY Left 01/13/2017   Procedure: LEFT TOTAL SHOULDER ARTHROPLASTY;  Surgeon: Eva Herring, MD;  Location: [facility];  Service: Orthopedics;  Laterality: Left;  LEFT TOTAL SHOULDER ARTHROPLASTY   TOTAL SHOULDER ARTHROPLASTY Right 02/11/2022   Procedure: TOTAL SHOULDER ARTHROPLASTY;  Surgeon:  Sharl Selinda Dover, MD;  Location: [facility];  Service: Orthopedics;  Laterality: Right;  150   TRIGGER FINGER RELEASE Right 08/29/2014   Procedure: RELEASE TRIGGER FINGER/A-1 PULLEY;  Surgeon: Arley Curia, MD;  Location: [facility];  Service: Orthopedics;  Laterality: Right;   wisdom teeth out     Patient Active Problem List   Diagnosis Date Noted   Osteoarthritis of right knee 08/02/2024   S/P total knee arthroplasty, right 08/02/2024   Osteoarthritis of left knee 09/15/2022   History of total shoulder replacement, right 02/11/2022   Osteoarthritis of left shoulder 01/13/2017   S/P shoulder replacement, left 01/13/2017   OSA on CPAP 04/01/2015   Subacute confusional state 04/01/2015   Hallucinations, visual 04/01/2015   Hypnapompic hallucinations 04/01/2015   Sleep apnea with use of continuous positive airway pressure (CPAP)    Hypersomnia, persistent 03/30/2013   Obstructive sleep apnea (adult) (pediatric) 03/30/2013   Constipation 11/18/2008   RECTAL BLEEDING 11/18/2008   Internal hemorrhoids 11/15/2008   Diverticulosis of colon 11/15/2008   CELLULITIS/ABSCESS, TRUNK 02/22/2008   LOW BACK PAIN 02/22/2008   DIZZINESS 02/22/2008   FATIGUE 02/22/2008   PALPITATIONS 02/22/2008   MURMUR, CARDIAC, UNDIAGNOSED 02/22/2008   BREAST CANCER, HX OF 02/22/2008   COUGH 01/05/2008   DEPRESSION 07/05/2007   UTERINE POLYP 07/05/2007   FIBROMYALGIA 07/05/2007   History of colonic polyps 07/05/2007   HX, PERSONAL, CERVICAL DYSPLASIA 07/05/2007    PCP: Loreli Elsie JONETTA Mickey., MD  REFERRING PROVIDER: Leigh Valery RAMAN, PA-C  REFERRING DIAG: F82.88 (ICD-10-CM) - Unilateral primary osteoarthritis, right knee  THERAPY DIAG:  Acute pain of right knee  Stiffness of right knee, not elsewhere classified  Localized edema  Rationale for Evaluation and Treatment: Rehabilitation  ONSET DATE: 08/02/24  SUBJECTIVE:   SUBJECTIVE STATEMENT: Pt states knee has been spasming since the  surgery 7/10 pain this afternoon. Pt states she is having issues with getting pain meds filled. Pt states she took pain management medicine this morning. Pt states she has finally been able to take a shower by herself. Pt reports she has 8/10 pain following therapy.  Eval: Patient reports that she had a right knee replacement on 08/02/24. She notes that it has been swelling up quite a bit and hurting since surgery. She was not given any exercise to do after surgery. She notes that her muscle relaxer's and pain medication are helping some.   PERTINENT HISTORY: OA, fibromyalgia, depression, history of cancer, and current smoker PAIN:  Are you having pain? Yes: NPRS scale: Current: 8/10 Best: 4/10 Worst: 8/10 Pain location: right knee and leg Pain description: burning, throbbing, and aching  Aggravating factors: bending her knee, movement Relieving factors: medication and ice  PRECAUTIONS: Fall  RED FLAGS: None   WEIGHT BEARING RESTRICTIONS: No  FALLS:  Has patient fallen in last 6 months? Yes. Number of falls 4-5; most recent was last week when she fell out of bed into the floor  LIVING ENVIRONMENT: Lives with: lives with their family Lives in: House/apartment Stairs: Yes: External: 8 steps; can reach  both Has following equipment at home: Vannie - 2 wheeled  OCCUPATION: not working  PLOF: Independent  PATIENT GOALS: improved mobility and reduced pain  NEXT MD VISIT: 08/20/24  OBJECTIVE:  Note: Objective measures were completed at Evaluation unless otherwise noted.  DIAGNOSTIC FINDINGS: EXAM: 1 or 2 VIEW(S) XRAY OF THE RIGHT KNEE 08/02/2024 03:02:00 PM   COMPARISON: None available.   CLINICAL HISTORY: S/P total knee arthroplasty, right.   FINDINGS:   BONES AND JOINTS: Right total knee arthroplasty in place. Gas in joint space compatible with immediate postoperative state. No acute fracture. No focal osseous lesion. No joint dislocation. No significant joint  effusion.   SOFT TISSUES: Gas in overlying soft tissues compatible with immediate postoperative state.   IMPRESSION: 1. Right total knee arthroplasty with expected immediate postoperative appearance. 2. Intra-articular and soft tissue gas compatible with immediate postoperative state.  PATIENT SURVEYS:  LEFS: 12 / 80 = 15.0 %  COGNITION: Overall cognitive status: Within functional limits for tasks assessed     SENSATION: WFL  EDEMA:  Circumferential: L tibiofemoral joint line: 40.5 cm R tibiofemoral joint line: 43.4   PALPATION: TTP: globally around right knee and lower leg  LOWER EXTREMITY ROM:  Active ROM Right eval Left eval  Hip flexion    Hip extension    Hip abduction    Hip adduction    Hip internal rotation    Hip external rotation    Knee flexion 47 PROM: 60 118  Knee extension 8 0  Ankle dorsiflexion    Ankle plantarflexion    Ankle inversion    Ankle eversion     (Blank rows = not tested)  LOWER EXTREMITY MMT: not tested due to surgical condition  MMT Right eval Left eval  Hip flexion    Hip extension    Hip abduction    Hip adduction    Hip internal rotation    Hip external rotation    Knee flexion    Knee extension    Ankle dorsiflexion    Ankle plantarflexion    Ankle inversion    Ankle eversion     (Blank rows = not tested)  FUNCTIONAL TESTS:  STS: avoids WB through RLE  GAIT: Assistive device utilized: Environmental consultant - 2 wheeled Level of assistance: Modified independence Comments: step through pattern with R knee flexed in stance phase                                                                                                                                 TREATMENT DATE:  08/16/2024  LEFS completed Therapeutic Exercise: -Supine heel slides AAROM 52 degrees of flexion obtained, 2 sets of 20 reps, pt cued for max ROM -Quad sets with towel underneath knee, 2 sets of 10 reps, pt cued to hold contraction for 5 seconds  -Supine  ankle pumps, 2 sets of 10 reps, pt cued for max ROM -Standing 3 way hip, RLE only,  1 set of 5 reps, pt cued for max tolerated ROM -Standing heel raises, 1 set of 5 reps, ceased due to pain in RLE   Therapeutic Activity: -Sit to stands, 2 sets of 3 reps, pt cued for RLE activation   08/12/24: PT evaluation, HEP, and education    PATIENT EDUCATION:  Education details: POC, healing, edema, HEP, and expectations following surgery  Person educated: Patient Education method: Explanation Education comprehension: verbalized understanding  HOME EXERCISE PROGRAM: Access Code: [email or web address] URL: [email or web address] Date: 08/13/2024 Prepared by: Lacinda Fass  Exercises - Supine Ankle Pumps  - 1 x daily - 7 x weekly - 2 sets - 10 reps - Supine Quad Set  - 1 x daily - 7 x weekly - 2 sets - 10 reps - 3-5 seconds hold - Supine Heel Slide  - 1 x daily - 7 x weekly - 2 sets - 10 reps  ASSESSMENT:  CLINICAL IMPRESSION: Patient continues to demonstrate significantly increased RLE pain, decreased RLE strength/ROM, decreased gait quality and balance. Pt is pain dominant and a few exercises were held and ceased due to pt pain level in RLE. Patient also demonstrates decreased endurance with standing tolerance during today's session. Patient able to progress dynamic balance and core activation exercises today with quad sets and R hip AROM, good performance with verbal cueing. Patient would continue to benefit from skilled physical therapy for increased endurance with ambulation, increased RLE strength, and improved balance for improved quality of life, improved independence with gait training and continued progress towards therapy goals.      OBJECTIVE IMPAIRMENTS: Abnormal gait, decreased activity tolerance, decreased balance, decreased knowledge of condition, decreased mobility, difficulty walking, decreased ROM, decreased strength, hypomobility, increased edema, impaired flexibility,  impaired tone, and pain.   ACTIVITY LIMITATIONS: lifting, sitting, standing, squatting, stairs, transfers, bed mobility, and locomotion level  PARTICIPATION LIMITATIONS: shopping and community activity  PERSONAL FACTORS: Past/current experiences, Time since onset of injury/illness/exacerbation, and 3+ comorbidities: OA, fibromyalgia, depression, history of cancer, and current smoker are also affecting patient's functional outcome.   REHAB POTENTIAL: Good  CLINICAL DECISION MAKING: Evolving/moderate complexity  EVALUATION COMPLEXITY: Moderate   GOALS: Goals reviewed with patient? No  SHORT TERM GOALS: Target date: 08/27/24  Patient will demonstrate evidence of independence with individualized HEP and will report compliance for at least 3 days per week for optimized progression towards remaining therapy goals. Baseline:  Goal status: INITIAL  2.  Patient will report a decrease in pain level during community ambulation by at least 2 points for improved quality of life. Baseline:  Goal status: INITIAL     LONG TERM GOALS: Target date: 10/01/24  Pt will demonstrate a an increase of at least 9 points on the LEFS for improved performance of community ambulation and ADL. Baseline:  Goal status: INITIAL  2.  Pt will be able to navigate at least 3 steps with a reciprocal pattern for improved function with household mobility. Baseline:   Goal status: INITIAL  3.  Pt will ambulate with no significant gait deviations for improved community mobility.  Baseline:   Goal status: INITIAL  4.  Patient will improve her right knee flexion to at least 115 degrees for improved function navigating steps.  Baseline:   Goal status: INITIAL  5.  Patient will improve her right knee extension to within 5 degrees of neutral for improved gait mechanics.  Baseline:   Goal status: INITIAL    PLAN:  PT FREQUENCY: 1-2x/week  PT DURATION: other: 7 weeks  PLANNED INTERVENTIONS: [id number]- PT  Re-evaluation, 97750- Physical Performance Testing, 97110-Therapeutic exercises, 97530- Therapeutic activity, [id number]- Neuromuscular re-education, [phone]- Self Care, [id number]- Manual therapy, [phone]- Gait training, [phone]- Electrical stimulation (unattended), 97016- Vasopneumatic device, Patient/Family education, Balance training, Stair training, Joint mobilization, Cryotherapy, and Moist heat  PLAN FOR NEXT SESSION: review and update HEP (as needed), AROM and PROM right knee flexion, manual therapy, and modalities as needed    Lang Ada, PT, DPT [facility] Office: [phone] 5:08 PM, 08/16/24

## 2024-08-21 ENCOUNTER — Encounter (HOSPITAL_COMMUNITY)

## 2024-08-23 ENCOUNTER — Ambulatory Visit (HOSPITAL_COMMUNITY)

## 2024-08-23 ENCOUNTER — Encounter (HOSPITAL_COMMUNITY): Payer: Self-pay

## 2024-08-23 DIAGNOSIS — M25561 Pain in right knee: Secondary | ICD-10-CM

## 2024-08-23 DIAGNOSIS — R262 Difficulty in walking, not elsewhere classified: Secondary | ICD-10-CM

## 2024-08-23 DIAGNOSIS — M25661 Stiffness of right knee, not elsewhere classified: Secondary | ICD-10-CM

## 2024-08-23 NOTE — Therapy (Signed)
 OUTPATIENT PHYSICAL THERAPY LOWER EXTREMITY TREATMENT   Patient Name: [name] MRN: [id number] DOB:11/23/1960, 63 y.o., female Today's Date: 08/23/2024  END OF SESSION:  PT End of Session - 08/23/24 1511     Visit Number 3    Number of Visits 14    Date for Recertification  10/05/24    Authorization Type [employer] [place] Preferred    Authorization Time Period No auth    Progress Note Due on Visit 10    PT Start Time 0311   pt late arrival   PT Stop Time 0347    PT Time Calculation (min) 36 min    Activity Tolerance Patient limited by pain    Behavior During Therapy [facility] for tasks assessed/performed           Past Medical History:  Diagnosis Date   Anxiety    Arthritis    Breast cancer (HCC)    Status post bilateral mastectomy   Breast reconstruction deformity    Complication of anesthesia    shaking-after mastectomies-resp and hr dropped   Depression, major    suicidal thoughts -implants, mental health, 05/11   Dyspnea    Fibromyalgia    GERD (gastroesophageal reflux disease)    Hypothyroidism    Narcolepsy    OSA on CPAP    PONV (postoperative nausea and vomiting)    Sleep apnea with use of continuous positive airway pressure (CPAP)    AHI 20 in HST, titration to 6 cm water  - residual AHi 6 . remaining hypersomnic.    Past Surgical History:  Procedure Laterality Date   ARTHRODESIS METATARSALPHALANGEAL JOINT (MTPJ) Right 07/28/2017   Procedure: ARTHRODESIS  RIGHT HALLUX METATARSALPHALANGEAL JOINT (MTPJ);  Surgeon: Kit Rush, MD;  Location: [facility];  Service: Orthopedics;  Laterality: Right;   Breast reconstructioin  7/09   flaps-bilat mastectomies   BTL     CARPAL TUNNEL RELEASE  2011   both hands   CARPOMETACARPEL SUSPENSION PLASTY Right 08/29/2014   Procedure: REMOVAL DERMASPAN SUSPENSIONPLASTY RIGHT THUMB ABDUCTOR POLLIS TRANSFER;  Surgeon: Arley Curia, MD;  Location: [facility];  Service: Orthopedics;  Laterality:  Right;   CATARACT EXTRACTION W/PHACO Right 12/17/2019   Procedure: CATARACT EXTRACTION PHACO AND INTRAOCULAR LENS PLACEMENT (IOC);  Surgeon: Harrie Agent, MD;  Location: [facility];  Service: Ophthalmology;  Laterality: Right;  CDE: 6.54   CATARACT EXTRACTION W/PHACO Left 12/31/2019   Procedure: CATARACT EXTRACTION PHACO AND INTRAOCULAR LENS PLACEMENT (IOC);  Surgeon: Harrie Agent, MD;  Location: [facility];  Service: Ophthalmology;  Laterality: Left;  CDE: 3.66   COLONOSCOPY W/ POLYPECTOMY  4/03   tubulovillous adenoma   FINGER ARTHROSCOPY WITH CARPOMETACARPEL (CMC) ARTHROPLASTY Right 06/20/2014   Procedure: ARTHROSCOPY RIGHT THUMB CARPOMETACARPEL GRAFT JACKET INTERPOSITION PARTIAL TRAPEZIECTOMY POSSIBLE OPEN;  Surgeon: Arley Curia, MD;  Location: [facility];  Service: Orthopedics;  Laterality: Right;   GANGLION CYST EXCISION     both [phone]   HAMMER TOE SURGERY Right 07/28/2017   Procedure: THIRD HAMMERTOE CORRECTION;  Surgeon: Kit Rush, MD;  Location: [facility];  Service: Orthopedics;  Laterality: Right;   HARDWARE REMOVAL Right 07/28/2017   Procedure: REMOVAL OF DEEP IMPLANTS;  Surgeon: Kit Rush, MD;  Location: [facility];  Service: Orthopedics;  Laterality: Right;   hysterestomy  2005   KNEE ARTHROPLASTY Left 09/15/2022   Procedure: COMPUTER ASSISTED TOTAL KNEE ARTHROPLASTY;  Surgeon: Fidel Rogue, MD;  Location: [facility];  Service: Orthopedics;  Laterality: Left;  KNEE ARTHROPLASTY Right 08/02/2024   Procedure: ARTHROPLASTY, KNEE, TOTAL, USING IMAGELESS COMPUTER-ASSISTED NAVIGATION;  Surgeon: Fidel Rogue, MD;  Location: [facility];  Service: Orthopedics;  Laterality: Right;   MASTECTOMY Bilateral 2008   post- a -cath  2008   right knee arthroscopy     TOTAL SHOULDER ARTHROPLASTY Left 01/13/2017   Procedure: LEFT TOTAL SHOULDER ARTHROPLASTY;  Surgeon: Eva Herring, MD;  Location: [facility];  Service: Orthopedics;  Laterality: Left;  LEFT TOTAL  SHOULDER ARTHROPLASTY   TOTAL SHOULDER ARTHROPLASTY Right 02/11/2022   Procedure: TOTAL SHOULDER ARTHROPLASTY;  Surgeon: Sharl Selinda Dover, MD;  Location: [facility];  Service: Orthopedics;  Laterality: Right;  150   TRIGGER FINGER RELEASE Right 08/29/2014   Procedure: RELEASE TRIGGER FINGER/A-1 PULLEY;  Surgeon: Arley Curia, MD;  Location: [facility];  Service: Orthopedics;  Laterality: Right;   wisdom teeth out     Patient Active Problem List   Diagnosis Date Noted   Osteoarthritis of right knee 08/02/2024   S/P total knee arthroplasty, right 08/02/2024   Osteoarthritis of left knee 09/15/2022   History of total shoulder replacement, right 02/11/2022   Osteoarthritis of left shoulder 01/13/2017   S/P shoulder replacement, left 01/13/2017   OSA on CPAP 04/01/2015   Subacute confusional state 04/01/2015   Hallucinations, visual 04/01/2015   Hypnapompic hallucinations 04/01/2015   Sleep apnea with use of continuous positive airway pressure (CPAP)    Hypersomnia, persistent 03/30/2013   Obstructive sleep apnea (adult) (pediatric) 03/30/2013   Constipation 11/18/2008   RECTAL BLEEDING 11/18/2008   Internal hemorrhoids 11/15/2008   Diverticulosis of colon 11/15/2008   CELLULITIS/ABSCESS, TRUNK 02/22/2008   LOW BACK PAIN 02/22/2008   DIZZINESS 02/22/2008   FATIGUE 02/22/2008   PALPITATIONS 02/22/2008   MURMUR, CARDIAC, UNDIAGNOSED 02/22/2008   BREAST CANCER, HX OF 02/22/2008   COUGH 01/05/2008   DEPRESSION 07/05/2007   UTERINE POLYP 07/05/2007   FIBROMYALGIA 07/05/2007   History of colonic polyps 07/05/2007   HX, PERSONAL, CERVICAL DYSPLASIA 07/05/2007    PCP: Loreli Elsie JONETTA Mickey., MD  REFERRING PROVIDER: Leigh Valery RAMAN, PA-C  REFERRING DIAG: F82.88 (ICD-10-CM) - Unilateral primary osteoarthritis, right knee  THERAPY DIAG:  Acute pain of right knee  Stiffness of right knee, not elsewhere classified  Difficulty in walking, not elsewhere classified  Rationale  for Evaluation and Treatment: Rehabilitation  ONSET DATE: 08/02/24  SUBJECTIVE:   SUBJECTIVE STATEMENT: Pt late arrival. PT reports pain in R knee pain today around 5/10. Reports she has been taking a few steps in home without RW, such as in bathroom but uses furniture for support. HEP going well at home.   Eval: Patient reports that she had a right knee replacement on 08/02/24. She notes that it has been swelling up quite a bit and hurting since surgery. She was not given any exercise to do after surgery. She notes that her muscle relaxer's and pain medication are helping some.   PERTINENT HISTORY: OA, fibromyalgia, depression, history of cancer, and current smoker PAIN:  Are you having pain? Yes: NPRS scale: Current: 8/10 Best: 4/10 Worst: 8/10 Pain location: right knee and leg Pain description: burning, throbbing, and aching  Aggravating factors: bending her knee, movement Relieving factors: medication and ice  PRECAUTIONS: Fall  RED FLAGS: None   WEIGHT BEARING RESTRICTIONS: No  FALLS:  Has patient fallen in last 6 months? Yes. Number of falls 4-5; most recent was last week when she fell out of bed into the floor  LIVING ENVIRONMENT: Lives  with: lives with their family Lives in: House/apartment Stairs: Yes: External: 8 steps; can reach both Has following equipment at home: Walker - 2 wheeled  OCCUPATION: not working  PLOF: Independent  PATIENT GOALS: improved mobility and reduced pain  NEXT MD VISIT: 08/20/24  OBJECTIVE:  Note: Objective measures were completed at Evaluation unless otherwise noted.  DIAGNOSTIC FINDINGS: EXAM: 1 or 2 VIEW(S) XRAY OF THE RIGHT KNEE 08/02/2024 03:02:00 PM   COMPARISON: None available.   CLINICAL HISTORY: S/P total knee arthroplasty, right.   FINDINGS:   BONES AND JOINTS: Right total knee arthroplasty in place. Gas in joint space compatible with immediate postoperative state. No acute fracture. No focal osseous lesion.  No joint dislocation. No significant joint effusion.   SOFT TISSUES: Gas in overlying soft tissues compatible with immediate postoperative state.   IMPRESSION: 1. Right total knee arthroplasty with expected immediate postoperative appearance. 2. Intra-articular and soft tissue gas compatible with immediate postoperative state.  PATIENT SURVEYS:  LEFS: 12 / 80 = 15.0 %  COGNITION: Overall cognitive status: Within functional limits for tasks assessed     SENSATION: WFL  EDEMA:  Circumferential: L tibiofemoral joint line: 40.5 cm R tibiofemoral joint line: 43.4   PALPATION: TTP: globally around right knee and lower leg  LOWER EXTREMITY ROM:  Active ROM Right eval Left eval  Hip flexion    Hip extension    Hip abduction    Hip adduction    Hip internal rotation    Hip external rotation    Knee flexion 47 PROM: 60 118  Knee extension 8 0  Ankle dorsiflexion    Ankle plantarflexion    Ankle inversion    Ankle eversion     (Blank rows = not tested)  LOWER EXTREMITY MMT: not tested due to surgical condition  MMT Right eval Left eval  Hip flexion    Hip extension    Hip abduction    Hip adduction    Hip internal rotation    Hip external rotation    Knee flexion    Knee extension    Ankle dorsiflexion    Ankle plantarflexion    Ankle inversion    Ankle eversion     (Blank rows = not tested)  FUNCTIONAL TESTS:  STS: avoids WB through RLE  GAIT: Assistive device utilized: Environmental consultant - 2 wheeled Level of assistance: Modified independence Comments: step through pattern with R knee flexed in stance phase                                                                                                                                 TREATMENT DATE:  08/23/24: Bike, seat 14, 4', half revolutions Seated heel slides with strap, pillowcase on foot, 5 holds, 2' AAROM w/ strap: 81 degrees flexion AROM: 75 deg flexion SAQ, 2x10 LAQ, 3 holds, 2x10 STS, 2 sets  of 5, no UE use Standing heel raises, 10x Standing hamstring curls,  10x on R  08/16/2024  LEFS completed Therapeutic Exercise: -Supine heel slides AAROM 52 degrees of flexion obtained, 2 sets of 20 reps, pt cued for max ROM -Quad sets with towel underneath knee, 2 sets of 10 reps, pt cued to hold contraction for 5 seconds  -Supine ankle pumps, 2 sets of 10 reps, pt cued for max ROM -Standing 3 way hip, RLE only, 1 set of 5 reps, pt cued for max tolerated ROM -Standing heel raises, 1 set of 5 reps, ceased due to pain in RLE Therapeutic Activity: -Sit to stands, 2 sets of 3 reps, pt cued for RLE activation   08/12/24: PT evaluation, HEP, and education    PATIENT EDUCATION:  Education details: POC, healing, edema, HEP, and expectations following surgery  Person educated: Patient Education method: Explanation Education comprehension: verbalized understanding  HOME EXERCISE PROGRAM: Access Code: [email or web address] URL: [email or web address] Date: 08/13/2024 Prepared by: Lacinda Fass  Exercises - Supine Ankle Pumps  - 1 x daily - 7 x weekly - 2 sets - 10 reps - Supine Quad Set  - 1 x daily - 7 x weekly - 2 sets - 10 reps - 3-5 seconds hold - Supine Heel Slide  - 1 x daily - 7 x weekly - 2 sets - 10 reps  ASSESSMENT:  CLINICAL IMPRESSION: Pt ambulates into session with RW. Reports moderate pain in R knee this date. Began session on recumbent bike for increasing ROM. Pt able to achieve half revolutions. Followed with continuing R knee flexion ROM. Pt able to actively flex knee to 75 degrees, 81 AAROM with discomfort. Remainder of session spent strengthening RLE musculature in supine, seated and standing positions. Notably, patient can stand without UE support this date but demonstrates slow labored movement. Pt reports 7/10 pain/discomfort at end of session. Educated on ice, and elevation once home as pt may have increased soreness. Patient would continue to benefit from skilled  physical therapy for increased endurance with ambulation, increased RLE strength, and improved balance for improved quality of life, improved independence with gait training and continued progress towards therapy goals.      OBJECTIVE IMPAIRMENTS: Abnormal gait, decreased activity tolerance, decreased balance, decreased knowledge of condition, decreased mobility, difficulty walking, decreased ROM, decreased strength, hypomobility, increased edema, impaired flexibility, impaired tone, and pain.   ACTIVITY LIMITATIONS: lifting, sitting, standing, squatting, stairs, transfers, bed mobility, and locomotion level  PARTICIPATION LIMITATIONS: shopping and community activity  PERSONAL FACTORS: Past/current experiences, Time since onset of injury/illness/exacerbation, and 3+ comorbidities: OA, fibromyalgia, depression, history of cancer, and current smoker are also affecting patient's functional outcome.   REHAB POTENTIAL: Good  CLINICAL DECISION MAKING: Evolving/moderate complexity  EVALUATION COMPLEXITY: Moderate   GOALS: Goals reviewed with patient? No  SHORT TERM GOALS: Target date: 08/27/24  Patient will demonstrate evidence of independence with individualized HEP and will report compliance for at least 3 days per week for optimized progression towards remaining therapy goals. Baseline:  Goal status: INITIAL  2.  Patient will report a decrease in pain level during community ambulation by at least 2 points for improved quality of life. Baseline:  Goal status: INITIAL     LONG TERM GOALS: Target date: 10/01/24  Pt will demonstrate a an increase of at least 9 points on the LEFS for improved performance of community ambulation and ADL. Baseline:  Goal status: INITIAL  2.  Pt will be able to navigate at least 3 steps with a reciprocal pattern for improved function with household mobility. Baseline:  Goal status: INITIAL  3.  Pt will ambulate with no significant gait deviations  for improved community mobility.  Baseline:   Goal status: INITIAL  4.  Patient will improve her right knee flexion to at least 115 degrees for improved function navigating steps.  Baseline:   Goal status: INITIAL  5.  Patient will improve her right knee extension to within 5 degrees of neutral for improved gait mechanics.  Baseline:   Goal status: INITIAL    PLAN:  PT FREQUENCY: 1-2x/week  PT DURATION: other: 7 weeks  PLANNED INTERVENTIONS: [id number]- PT Re-evaluation, 97750- Physical Performance Testing, 97110-Therapeutic exercises, 97530- Therapeutic activity, [id number]- Neuromuscular re-education, 97535- Self Care, [id number]- Manual therapy, [id number]- Gait training, [phone]- Electrical stimulation (unattended), 97016- Vasopneumatic device, Patient/Family education, Balance training, Stair training, Joint mobilization, Cryotherapy, and Moist heat  PLAN FOR NEXT SESSION: review and update HEP (as needed), AROM and PROM right knee flexion, manual therapy, and modalities as needed    3:52 PM, 08/23/24 Evaristo Tsuda Powell-Butler, PT, DPT [facility] [name]

## 2024-08-28 ENCOUNTER — Ambulatory Visit (HOSPITAL_COMMUNITY): Admitting: Physical Therapy

## 2024-08-28 DIAGNOSIS — M25561 Pain in right knee: Secondary | ICD-10-CM | POA: Diagnosis not present

## 2024-08-28 DIAGNOSIS — R262 Difficulty in walking, not elsewhere classified: Secondary | ICD-10-CM

## 2024-08-28 DIAGNOSIS — M25661 Stiffness of right knee, not elsewhere classified: Secondary | ICD-10-CM

## 2024-08-28 NOTE — Therapy (Signed)
 OUTPATIENT PHYSICAL THERAPY LOWER EXTREMITY TREATMENT   Patient Name: [name] MRN: [id number] DOB:06/01/1961, 63 y.o., female Today's Date: 08/28/2024  END OF SESSION:  PT End of Session - 08/28/24 1512     Visit Number 4    Number of Visits 14    Date for Recertification  10/05/24    Authorization Type [employer] [place] [employer]erred    Authorization Time Period No auth    Progress Note Due on Visit 10    PT Start Time 1510    PT Stop Time 1545    PT Time Calculation (min) 35 min    Activity Tolerance Patient limited by pain    Behavior During Therapy WFL for tasks assessed/performed            Past Medical History:  Diagnosis Date   Anxiety    Arthritis    Breast cancer (HCC)    Status post bilateral mastectomy   Breast reconstruction deformity    Complication of anesthesia    shaking-after mastectomies-resp and hr dropped   Depression, major    suicidal thoughts -implants, mental health, 05/11   Dyspnea    Fibromyalgia    GERD (gastroesophageal reflux disease)    Hypothyroidism    Narcolepsy    OSA on CPAP    PONV (postoperative nausea and vomiting)    Sleep apnea with use of continuous positive airway pressure (CPAP)    AHI 20 in HST, titration to 6 cm water  - residual AHi 6 . remaining hypersomnic.    Past Surgical History:  Procedure Laterality Date   ARTHRODESIS METATARSALPHALANGEAL JOINT (MTPJ) Right 07/28/2017   Procedure: ARTHRODESIS  RIGHT HALLUX METATARSALPHALANGEAL JOINT (MTPJ);  Surgeon: Kit Rush, MD;  Location: [facility];  Service: Orthopedics;  Laterality: Right;   Breast reconstructioin  7/09   flaps-bilat mastectomies   BTL     CARPAL TUNNEL RELEASE  2011   both hands   CARPOMETACARPEL SUSPENSION PLASTY Right 08/29/2014   Procedure: REMOVAL DERMASPAN SUSPENSIONPLASTY RIGHT THUMB ABDUCTOR POLLIS TRANSFER;  Surgeon: Arley Curia, MD;  Location: [facility];  Service: Orthopedics;  Laterality: Right;    CATARACT EXTRACTION W/PHACO Right 12/17/2019   Procedure: CATARACT EXTRACTION PHACO AND INTRAOCULAR LENS PLACEMENT (IOC);  Surgeon: Harrie Agent, MD;  Location: [facility];  Service: Ophthalmology;  Laterality: Right;  CDE: 6.54   CATARACT EXTRACTION W/PHACO Left 12/31/2019   Procedure: CATARACT EXTRACTION PHACO AND INTRAOCULAR LENS PLACEMENT (IOC);  Surgeon: Harrie Agent, MD;  Location: [facility];  Service: Ophthalmology;  Laterality: Left;  CDE: 3.66   COLONOSCOPY W/ POLYPECTOMY  4/03   tubulovillous adenoma   FINGER ARTHROSCOPY WITH CARPOMETACARPEL (CMC) ARTHROPLASTY Right 06/20/2014   Procedure: ARTHROSCOPY RIGHT THUMB CARPOMETACARPEL GRAFT JACKET INTERPOSITION PARTIAL TRAPEZIECTOMY POSSIBLE OPEN;  Surgeon: Arley Curia, MD;  Location: [facility];  Service: Orthopedics;  Laterality: Right;   GANGLION CYST EXCISION     both [phone]   HAMMER TOE SURGERY Right 07/28/2017   Procedure: THIRD HAMMERTOE CORRECTION;  Surgeon: Kit Rush, MD;  Location: [facility];  Service: Orthopedics;  Laterality: Right;   HARDWARE REMOVAL Right 07/28/2017   Procedure: REMOVAL OF DEEP IMPLANTS;  Surgeon: Kit Rush, MD;  Location: [facility];  Service: Orthopedics;  Laterality: Right;   hysterestomy  2005   KNEE ARTHROPLASTY Left 09/15/2022   Procedure: COMPUTER ASSISTED TOTAL KNEE ARTHROPLASTY;  Surgeon: Fidel Rogue, MD;  Location: [facility];  Service: Orthopedics;  Laterality: Left;   KNEE ARTHROPLASTY  Right 08/02/2024   Procedure: ARTHROPLASTY, KNEE, TOTAL, USING IMAGELESS COMPUTER-ASSISTED NAVIGATION;  Surgeon: Fidel Rogue, MD;  Location: [facility];  Service: Orthopedics;  Laterality: Right;   MASTECTOMY Bilateral 2008   post- a -cath  2008   right knee arthroscopy     TOTAL SHOULDER ARTHROPLASTY Left 01/13/2017   Procedure: LEFT TOTAL SHOULDER ARTHROPLASTY;  Surgeon: Eva Herring, MD;  Location: [facility];  Service: Orthopedics;  Laterality: Left;  LEFT TOTAL SHOULDER  ARTHROPLASTY   TOTAL SHOULDER ARTHROPLASTY Right 02/11/2022   Procedure: TOTAL SHOULDER ARTHROPLASTY;  Surgeon: Sharl Selinda Dover, MD;  Location: [facility];  Service: Orthopedics;  Laterality: Right;  150   TRIGGER FINGER RELEASE Right 08/29/2014   Procedure: RELEASE TRIGGER FINGER/A-1 PULLEY;  Surgeon: Arley Curia, MD;  Location: [facility];  Service: Orthopedics;  Laterality: Right;   wisdom teeth out     Patient Active Problem List   Diagnosis Date Noted   Osteoarthritis of right knee 08/02/2024   S/P total knee arthroplasty, right 08/02/2024   Osteoarthritis of left knee 09/15/2022   History of total shoulder replacement, right 02/11/2022   Osteoarthritis of left shoulder 01/13/2017   S/P shoulder replacement, left 01/13/2017   OSA on CPAP 04/01/2015   Subacute confusional state 04/01/2015   Hallucinations, visual 04/01/2015   Hypnapompic hallucinations 04/01/2015   Sleep apnea with use of continuous positive airway pressure (CPAP)    Hypersomnia, persistent 03/30/2013   Obstructive sleep apnea (adult) (pediatric) 03/30/2013   Constipation 11/18/2008   RECTAL BLEEDING 11/18/2008   Internal hemorrhoids 11/15/2008   Diverticulosis of colon 11/15/2008   CELLULITIS/ABSCESS, TRUNK 02/22/2008   LOW BACK PAIN 02/22/2008   DIZZINESS 02/22/2008   FATIGUE 02/22/2008   PALPITATIONS 02/22/2008   MURMUR, CARDIAC, UNDIAGNOSED 02/22/2008   BREAST CANCER, HX OF 02/22/2008   COUGH 01/05/2008   DEPRESSION 07/05/2007   UTERINE POLYP 07/05/2007   FIBROMYALGIA 07/05/2007   History of colonic polyps 07/05/2007   HX, PERSONAL, CERVICAL DYSPLASIA 07/05/2007    PCP: Loreli Elsie JONETTA Mickey., MD  REFERRING PROVIDER: Leigh Valery RAMAN, PA-C  REFERRING DIAG: F82.88 (ICD-10-CM) - Unilateral primary osteoarthritis, right knee  THERAPY DIAG:  Acute pain of right knee  Stiffness of right knee, not elsewhere classified  Difficulty in walking, not elsewhere classified  Rationale for  Evaluation and Treatment: Rehabilitation  ONSET DATE: 08/02/24  SUBJECTIVE:   SUBJECTIVE STATEMENT: PT reports pain in R knee pain today around 5/10. Reports she has been taking a few steps in home without RW.  Compliance with HEP a couple times a day.   Eval: Patient reports that she had a right knee replacement on 08/02/24. She notes that it has been swelling up quite a bit and hurting since surgery. She was not given any exercise to do after surgery. She notes that her muscle relaxer's and pain medication are helping some.   PERTINENT HISTORY: OA, fibromyalgia, depression, history of cancer, and current smoker PAIN:  Are you having pain? Yes: NPRS scale: Current: 8/10 Best: 4/10 Worst: 8/10 Pain location: right knee and leg Pain description: burning, throbbing, and aching  Aggravating factors: bending her knee, movement Relieving factors: medication and ice  PRECAUTIONS: Fall  RED FLAGS: None   WEIGHT BEARING RESTRICTIONS: No  FALLS:  Has patient fallen in last 6 months? Yes. Number of falls 4-5; most recent was last week when she fell out of bed into the floor  LIVING ENVIRONMENT: Lives with: lives with their family Lives in: House/apartment Stairs: Yes:  External: 8 steps; can reach both Has following equipment at home: Walker - 2 wheeled  OCCUPATION: not working  PLOF: Independent  PATIENT GOALS: improved mobility and reduced pain  NEXT MD VISIT: 08/20/24  OBJECTIVE:  Note: Objective measures were completed at Evaluation unless otherwise noted.  DIAGNOSTIC FINDINGS: EXAM: 1 or 2 VIEW(S) XRAY OF THE RIGHT KNEE 08/02/2024 03:02:00 PM   COMPARISON: None available.   CLINICAL HISTORY: S/P total knee arthroplasty, right.   FINDINGS:   BONES AND JOINTS: Right total knee arthroplasty in place. Gas in joint space compatible with immediate postoperative state. No acute fracture. No focal osseous lesion. No joint dislocation. No significant joint effusion.    SOFT TISSUES: Gas in overlying soft tissues compatible with immediate postoperative state.   IMPRESSION: 1. Right total knee arthroplasty with expected immediate postoperative appearance. 2. Intra-articular and soft tissue gas compatible with immediate postoperative state.  PATIENT SURVEYS:  LEFS: 12 / 80 = 15.0 %  COGNITION: Overall cognitive status: Within functional limits for tasks assessed     SENSATION: WFL  EDEMA:  Circumferential: L tibiofemoral joint line: 40.5 cm R tibiofemoral joint line: 43.4   PALPATION: TTP: globally around right knee and lower leg  LOWER EXTREMITY ROM:  Active ROM Right eval Left eval  Hip flexion    Hip extension    Hip abduction    Hip adduction    Hip internal rotation    Hip external rotation    Knee flexion 47 PROM: 60 118  Knee extension 8 0  Ankle dorsiflexion    Ankle plantarflexion    Ankle inversion    Ankle eversion     (Blank rows = not tested)  LOWER EXTREMITY MMT: not tested due to surgical condition  MMT Right eval Left eval  Hip flexion    Hip extension    Hip abduction    Hip adduction    Hip internal rotation    Hip external rotation    Knee flexion    Knee extension    Ankle dorsiflexion    Ankle plantarflexion    Ankle inversion    Ankle eversion     (Blank rows = not tested)  FUNCTIONAL TESTS:  STS: avoids WB through RLE  GAIT: Assistive device utilized: Environmental consultant - 2 wheeled Level of assistance: Modified independence Comments: step through pattern with R knee flexed in stance phase                                                                                                                                 TREATMENT DATE:  08/28/24 Bike seat 12 half rock 4 minutes Standing:  Rt knee flexion 12 step for increasing flexion 5X10   Rt knee hamstring curl 10X Seated: heelslides with towel 10X  LAQ 2X10 with 3 holds Sit to stands no UE feet even 10X Supine:  quad sets 10X 5 RT (pt with  pain, difficulty doing these) AROM  Rt -5 to 80    08/23/24: Bike, seat 14, 4', half revolutions Seated heel slides with strap, pillowcase on foot, 5 holds, 2' AAROM w/ strap: 81 degrees flexion AROM: 75 deg flexion SAQ, 2x10 LAQ, 3 holds, 2x10 STS, 2 sets of 5, no UE use Standing heel raises, 10x Standing hamstring curls, 10x on R  08/16/2024  LEFS completed Therapeutic Exercise: -Supine heel slides AAROM 52 degrees of flexion obtained, 2 sets of 20 reps, pt cued for max ROM -Quad sets with towel underneath knee, 2 sets of 10 reps, pt cued to hold contraction for 5 seconds  -Supine ankle pumps, 2 sets of 10 reps, pt cued for max ROM -Standing 3 way hip, RLE only, 1 set of 5 reps, pt cued for max tolerated ROM -Standing heel raises, 1 set of 5 reps, ceased due to pain in RLE Therapeutic Activity: -Sit to stands, 2 sets of 3 reps, pt cued for RLE activation   08/12/24: PT evaluation, HEP, and education    PATIENT EDUCATION:  Education details: POC, healing, edema, HEP, and expectations following surgery  Person educated: Patient Education method: Explanation Education comprehension: verbalized understanding  HOME EXERCISE PROGRAM: Access Code: [email or web address] URL: [email or web address] Date: 08/13/2024 Prepared by: Lacinda Fass  Exercises - Supine Ankle Pumps  - 1 x daily - 7 x weekly - 2 sets - 10 reps - Supine Quad Set  - 1 x daily - 7 x weekly - 2 sets - 10 reps - 3-5 seconds hold - Supine Heel Slide  - 1 x daily - 7 x weekly - 2 sets - 10 reps  ASSESSMENT:  CLINICAL IMPRESSION: Unsure if late or late check in; session limited by late arrival. PT with overall slow mobility; poor tolerance of activity overall.  Maintains moderate pain report throughout session, frequent rest breaks needed.  Continued on recumbent bike for increasing ROM, however only half revolutions obtained.  Instructed with quad sets, however reported pain and had never been instructed  with these (they were given for HEP).  Pt able to complete though with increased time due to having to stop for pain reasons.  Encouraged to complete exercises more at home, specifically quad set and shown all positions this exercise can be completed in.  Pt verbalized understanding.  Pt able to actively flex knee to 80 degrees today and reported that was as far as she could go today. Patient will continue to benefit from skilled physical therapy for increased endurance with ambulation, increased RLE strength, and improved balance for improved quality of life, improved independence with gait training and continued progress towards therapy goals.      OBJECTIVE IMPAIRMENTS: Abnormal gait, decreased activity tolerance, decreased balance, decreased knowledge of condition, decreased mobility, difficulty walking, decreased ROM, decreased strength, hypomobility, increased edema, impaired flexibility, impaired tone, and pain.   ACTIVITY LIMITATIONS: lifting, sitting, standing, squatting, stairs, transfers, bed mobility, and locomotion level  PARTICIPATION LIMITATIONS: shopping and community activity  PERSONAL FACTORS: Past/current experiences, Time since onset of injury/illness/exacerbation, and 3+ comorbidities: OA, fibromyalgia, depression, history of cancer, and current smoker are also affecting patient's functional outcome.   REHAB POTENTIAL: Good  CLINICAL DECISION MAKING: Evolving/moderate complexity  EVALUATION COMPLEXITY: Moderate   GOALS: Goals reviewed with patient? No  SHORT TERM GOALS: Target date: 08/27/24  Patient will demonstrate evidence of independence with individualized HEP and will report compliance for at least 3 days per week for optimized progression towards remaining therapy goals. Baseline:  Goal status: INITIAL  2.  Patient will report a decrease in pain level during community ambulation by at least 2 points for improved quality of life. Baseline:  Goal status:  INITIAL     LONG TERM GOALS: Target date: 10/01/24  Pt will demonstrate a an increase of at least 9 points on the LEFS for improved performance of community ambulation and ADL. Baseline:  Goal status: INITIAL  2.  Pt will be able to navigate at least 3 steps with a reciprocal pattern for improved function with household mobility. Baseline:   Goal status: INITIAL  3.  Pt will ambulate with no significant gait deviations for improved community mobility.  Baseline:   Goal status: INITIAL  4.  Patient will improve her right knee flexion to at least 115 degrees for improved function navigating steps.  Baseline:   Goal status: INITIAL  5.  Patient will improve her right knee extension to within 5 degrees of neutral for improved gait mechanics.  Baseline:   Goal status: INITIAL    PLAN:  PT FREQUENCY: 1-2x/week  PT DURATION: other: 7 weeks  PLANNED INTERVENTIONS: [id number]- PT Re-evaluation, 97750- Physical Performance Testing, 97110-Therapeutic exercises, 97530- Therapeutic activity, [id number]- Neuromuscular re-education, 97535- Self Care, [id number]- Manual therapy, [id number]- Gait training, [phone]- Electrical stimulation (unattended), 97016- Vasopneumatic device, Patient/Family education, Balance training, Stair training, Joint mobilization, Cryotherapy, and Moist heat  PLAN FOR NEXT SESSION: update HEP as needed. Progress AROM and PROM right knee flexion and extension.    3:12 PM, 08/28/24 Greig KATHEE Fuse, PTA/CLT [facility] Ph: [phone]

## 2024-08-30 ENCOUNTER — Ambulatory Visit (HOSPITAL_COMMUNITY)

## 2024-08-30 ENCOUNTER — Encounter (HOSPITAL_COMMUNITY): Payer: Self-pay

## 2024-08-30 ENCOUNTER — Encounter (HOSPITAL_COMMUNITY)

## 2024-08-30 DIAGNOSIS — R262 Difficulty in walking, not elsewhere classified: Secondary | ICD-10-CM

## 2024-08-30 DIAGNOSIS — M25661 Stiffness of right knee, not elsewhere classified: Secondary | ICD-10-CM

## 2024-08-30 DIAGNOSIS — M25561 Pain in right knee: Secondary | ICD-10-CM

## 2024-08-30 DIAGNOSIS — R600 Localized edema: Secondary | ICD-10-CM

## 2024-08-30 NOTE — Therapy (Signed)
 OUTPATIENT PHYSICAL THERAPY LOWER EXTREMITY TREATMENT   Patient Name: [name] MRN: [id number] DOB:1960-12-18, 63 y.o., female Today's Date: 08/30/2024  END OF SESSION:  PT End of Session - 08/30/24 1118     Visit Number 5    Number of Visits 14    Date for Recertification  10/05/24    Authorization Type [employer] [place] [employer]erred    Authorization Time Period No auth    Progress Note Due on Visit 10    PT Start Time 1118    PT Stop Time 1156    PT Time Calculation (min) 38 min    Activity Tolerance Patient limited by pain;Patient tolerated treatment well    Behavior During Therapy [facility] for tasks assessed/performed             Past Medical History:  Diagnosis Date   Anxiety    Arthritis    Breast cancer (HCC)    Status post bilateral mastectomy   Breast reconstruction deformity    Complication of anesthesia    shaking-after mastectomies-resp and hr dropped   Depression, major    suicidal thoughts -implants, mental health, 05/11   Dyspnea    Fibromyalgia    GERD (gastroesophageal reflux disease)    Hypothyroidism    Narcolepsy    OSA on CPAP    PONV (postoperative nausea and vomiting)    Sleep apnea with use of continuous positive airway pressure (CPAP)    AHI 20 in HST, titration to 6 cm water  - residual AHi 6 . remaining hypersomnic.    Past Surgical History:  Procedure Laterality Date   ARTHRODESIS METATARSALPHALANGEAL JOINT (MTPJ) Right 07/28/2017   Procedure: ARTHRODESIS  RIGHT HALLUX METATARSALPHALANGEAL JOINT (MTPJ);  Surgeon: Kit Rush, MD;  Location: [facility];  Service: Orthopedics;  Laterality: Right;   Breast reconstructioin  7/09   flaps-bilat mastectomies   BTL     CARPAL TUNNEL RELEASE  2011   both hands   CARPOMETACARPEL SUSPENSION PLASTY Right 08/29/2014   Procedure: REMOVAL DERMASPAN SUSPENSIONPLASTY RIGHT THUMB ABDUCTOR POLLIS TRANSFER;  Surgeon: Arley Curia, MD;  Location: [facility];  Service:  Orthopedics;  Laterality: Right;   CATARACT EXTRACTION W/PHACO Right 12/17/2019   Procedure: CATARACT EXTRACTION PHACO AND INTRAOCULAR LENS PLACEMENT (IOC);  Surgeon: Harrie Agent, MD;  Location: [facility];  Service: Ophthalmology;  Laterality: Right;  CDE: 6.54   CATARACT EXTRACTION W/PHACO Left 12/31/2019   Procedure: CATARACT EXTRACTION PHACO AND INTRAOCULAR LENS PLACEMENT (IOC);  Surgeon: Harrie Agent, MD;  Location: [facility];  Service: Ophthalmology;  Laterality: Left;  CDE: 3.66   COLONOSCOPY W/ POLYPECTOMY  4/03   tubulovillous adenoma   FINGER ARTHROSCOPY WITH CARPOMETACARPEL (CMC) ARTHROPLASTY Right 06/20/2014   Procedure: ARTHROSCOPY RIGHT THUMB CARPOMETACARPEL GRAFT JACKET INTERPOSITION PARTIAL TRAPEZIECTOMY POSSIBLE OPEN;  Surgeon: Arley Curia, MD;  Location: [facility];  Service: Orthopedics;  Laterality: Right;   GANGLION CYST EXCISION     both [phone]   HAMMER TOE SURGERY Right 07/28/2017   Procedure: THIRD HAMMERTOE CORRECTION;  Surgeon: Kit Rush, MD;  Location: [facility];  Service: Orthopedics;  Laterality: Right;   HARDWARE REMOVAL Right 07/28/2017   Procedure: REMOVAL OF DEEP IMPLANTS;  Surgeon: Kit Rush, MD;  Location: [facility];  Service: Orthopedics;  Laterality: Right;   hysterestomy  2005   KNEE ARTHROPLASTY Left 09/15/2022   Procedure: COMPUTER ASSISTED TOTAL KNEE ARTHROPLASTY;  Surgeon: Fidel Rogue, MD;  Location: [facility];  Service: Orthopedics;  Laterality: Left;  KNEE ARTHROPLASTY Right 08/02/2024   Procedure: ARTHROPLASTY, KNEE, TOTAL, USING IMAGELESS COMPUTER-ASSISTED NAVIGATION;  Surgeon: Fidel Rogue, MD;  Location: [facility];  Service: Orthopedics;  Laterality: Right;   MASTECTOMY Bilateral 2008   post- a -cath  2008   right knee arthroscopy     TOTAL SHOULDER ARTHROPLASTY Left 01/13/2017   Procedure: LEFT TOTAL SHOULDER ARTHROPLASTY;  Surgeon: Eva Herring, MD;  Location: [facility];  Service: Orthopedics;   Laterality: Left;  LEFT TOTAL SHOULDER ARTHROPLASTY   TOTAL SHOULDER ARTHROPLASTY Right 02/11/2022   Procedure: TOTAL SHOULDER ARTHROPLASTY;  Surgeon: Sharl Selinda Dover, MD;  Location: [facility];  Service: Orthopedics;  Laterality: Right;  150   TRIGGER FINGER RELEASE Right 08/29/2014   Procedure: RELEASE TRIGGER FINGER/A-1 PULLEY;  Surgeon: Arley Curia, MD;  Location: [facility];  Service: Orthopedics;  Laterality: Right;   wisdom teeth out     Patient Active Problem List   Diagnosis Date Noted   Osteoarthritis of right knee 08/02/2024   S/P total knee arthroplasty, right 08/02/2024   Osteoarthritis of left knee 09/15/2022   History of total shoulder replacement, right 02/11/2022   Osteoarthritis of left shoulder 01/13/2017   S/P shoulder replacement, left 01/13/2017   OSA on CPAP 04/01/2015   Subacute confusional state 04/01/2015   Hallucinations, visual 04/01/2015   Hypnapompic hallucinations 04/01/2015   Sleep apnea with use of continuous positive airway pressure (CPAP)    Hypersomnia, persistent 03/30/2013   Obstructive sleep apnea (adult) (pediatric) 03/30/2013   Constipation 11/18/2008   RECTAL BLEEDING 11/18/2008   Internal hemorrhoids 11/15/2008   Diverticulosis of colon 11/15/2008   CELLULITIS/ABSCESS, TRUNK 02/22/2008   LOW BACK PAIN 02/22/2008   DIZZINESS 02/22/2008   FATIGUE 02/22/2008   PALPITATIONS 02/22/2008   MURMUR, CARDIAC, UNDIAGNOSED 02/22/2008   BREAST CANCER, HX OF 02/22/2008   COUGH 01/05/2008   DEPRESSION 07/05/2007   UTERINE POLYP 07/05/2007   FIBROMYALGIA 07/05/2007   History of colonic polyps 07/05/2007   HX, PERSONAL, CERVICAL DYSPLASIA 07/05/2007    PCP: Loreli Elsie JONETTA Mickey., MD  REFERRING PROVIDER: Leigh Valery RAMAN, PA-C  REFERRING DIAG: F82.88 (ICD-10-CM) - Unilateral primary osteoarthritis, right knee  THERAPY DIAG:  Acute pain of right knee  Stiffness of right knee, not elsewhere classified  Difficulty in walking, not  elsewhere classified  Localized edema  Rationale for Evaluation and Treatment: Rehabilitation  ONSET DATE: 08/02/24  SUBJECTIVE:   SUBJECTIVE STATEMENT: Pt continues to report pain in R knee pain today around 5/10.   Compliance with HEP a little something everyday.   Eval: Patient reports that she had a right knee replacement on 08/02/24. She notes that it has been swelling up quite a bit and hurting since surgery. She was not given any exercise to do after surgery. She notes that her muscle relaxer's and pain medication are helping some.   PERTINENT HISTORY: OA, fibromyalgia, depression, history of cancer, and current smoker PAIN:  Are you having pain? Yes: NPRS scale: Current: 8/10 Best: 4/10 Worst: 8/10 Pain location: right knee and leg Pain description: burning, throbbing, and aching  Aggravating factors: bending her knee, movement Relieving factors: medication and ice  PRECAUTIONS: Fall  RED FLAGS: None   WEIGHT BEARING RESTRICTIONS: No  FALLS:  Has patient fallen in last 6 months? Yes. Number of falls 4-5; most recent was last week when she fell out of bed into the floor  LIVING ENVIRONMENT: Lives with: lives with their family Lives in: House/apartment Stairs: Yes: External: 8 steps; can reach  both Has following equipment at home: Vannie - 2 wheeled  OCCUPATION: not working  PLOF: Independent  PATIENT GOALS: improved mobility and reduced pain  NEXT MD VISIT: 08/20/24  OBJECTIVE:  Note: Objective measures were completed at Evaluation unless otherwise noted.  DIAGNOSTIC FINDINGS: EXAM: 1 or 2 VIEW(S) XRAY OF THE RIGHT KNEE 08/02/2024 03:02:00 PM   COMPARISON: None available.   CLINICAL HISTORY: S/P total knee arthroplasty, right.   FINDINGS:   BONES AND JOINTS: Right total knee arthroplasty in place. Gas in joint space compatible with immediate postoperative state. No acute fracture. No focal osseous lesion. No joint dislocation. No significant  joint effusion.   SOFT TISSUES: Gas in overlying soft tissues compatible with immediate postoperative state.   IMPRESSION: 1. Right total knee arthroplasty with expected immediate postoperative appearance. 2. Intra-articular and soft tissue gas compatible with immediate postoperative state.  PATIENT SURVEYS:  LEFS: 12 / 80 = 15.0 %  COGNITION: Overall cognitive status: Within functional limits for tasks assessed     SENSATION: WFL  EDEMA:  Circumferential: L tibiofemoral joint line: 40.5 cm R tibiofemoral joint line: 43.4   PALPATION: TTP: globally around right knee and lower leg  LOWER EXTREMITY ROM:  Active ROM Right eval Left eval  Hip flexion    Hip extension    Hip abduction    Hip adduction    Hip internal rotation    Hip external rotation    Knee flexion 47 PROM: 60 118  Knee extension 8 0  Ankle dorsiflexion    Ankle plantarflexion    Ankle inversion    Ankle eversion     (Blank rows = not tested)  LOWER EXTREMITY MMT: not tested due to surgical condition  MMT Right eval Left eval  Hip flexion    Hip extension    Hip abduction    Hip adduction    Hip internal rotation    Hip external rotation    Knee flexion    Knee extension    Ankle dorsiflexion    Ankle plantarflexion    Ankle inversion    Ankle eversion     (Blank rows = not tested)  FUNCTIONAL TESTS:  STS: avoids WB through RLE  GAIT: Assistive device utilized: Environmental consultant - 2 wheeled Level of assistance: Modified independence Comments: step through pattern with R knee flexed in stance phase                                                                                                                                 TREATMENT DATE:  08/30/2024  Manual Therapy: -Patellar mobs of R knee, inferior/superior/medial/lateral, grade II-III mobilizations -STM of R knee, avoidance of incisional site today Therapeutic Exercise: -Supine heel slides AAROM 52 degrees of flexion obtained, 2  sets of 20 reps, pt cued for max ROM -Quad sets with towel underneath knee, 2 sets of 10 reps, pt cued to hold contraction for 3 seconds  -Supine  bridges, 1 set of 8 reps, pt cued for form and 3 second holds -Standing heel raises, 2 sets of 10 reps, pt cued for max ROM -Supine DKTC on green exercise ball, 1 set of 8 reps, pt cued for use of LLE for assistance for R knee flexion Therapeutic Activity: -Step up and overs, 8 inch step, 1 set of 5 reps,pt cued for sequencing -Lateral step up and overs, 8 inch step, 1 set of 5 reps, pt cued for sequencing  08/28/24 Bike seat 12 half rock 4 minutes Standing:  Rt knee flexion 12 step for increasing flexion 5X10   Rt knee hamstring curl 10X Seated: heelslides with towel 10X  LAQ 2X10 with 3 holds Sit to stands no UE feet even 10X Supine:  quad sets 10X 5 RT (pt with pain, difficulty doing these) AROM Rt -5 to 80    08/23/24: Bike, seat 14, 4', half revolutions Seated heel slides with strap, pillowcase on foot, 5 holds, 2' AAROM w/ strap: 81 degrees flexion AROM: 75 deg flexion SAQ, 2x10 LAQ, 3 holds, 2x10 STS, 2 sets of 5, no UE use Standing heel raises, 10x Standing hamstring curls, 10x on R  PATIENT EDUCATION:  Education details: POC, healing, edema, HEP, and expectations following surgery  Person educated: Patient Education method: Explanation Education comprehension: verbalized understanding  HOME EXERCISE PROGRAM: Access Code: [email or web address] URL: [email or web address] Date: 08/13/2024 Prepared by: Lacinda Fass  Exercises - Supine Ankle Pumps  - 1 x daily - 7 x weekly - 2 sets - 10 reps - Supine Quad Set  - 1 x daily - 7 x weekly - 2 sets - 10 reps - 3-5 seconds hold - Supine Heel Slide  - 1 x daily - 7 x weekly - 2 sets - 10 reps  ASSESSMENT:  CLINICAL IMPRESSION: Patient continues to demonstrate increased R knee pain, decreased RLE strength/ROM, improved gait quality and balance. Patient also  demonstrates good response with manual therapy this date, incision avoided with lotion this date. Patient able to progress dynamic balance and core activation exercises today with bridge variations and step ups, good performance with verbal cueing. Patient would continue to benefit from skilled physical therapy for decreased R knee pain, increased endurance with ambulation, increased R knee strength/ROM, and improved balance for improved quality of life, improved independence with gait training and continued progress towards therapy goals.       OBJECTIVE IMPAIRMENTS: Abnormal gait, decreased activity tolerance, decreased balance, decreased knowledge of condition, decreased mobility, difficulty walking, decreased ROM, decreased strength, hypomobility, increased edema, impaired flexibility, impaired tone, and pain.   ACTIVITY LIMITATIONS: lifting, sitting, standing, squatting, stairs, transfers, bed mobility, and locomotion level  PARTICIPATION LIMITATIONS: shopping and community activity  PERSONAL FACTORS: Past/current experiences, Time since onset of injury/illness/exacerbation, and 3+ comorbidities: OA, fibromyalgia, depression, history of cancer, and current smoker are also affecting patient's functional outcome.   REHAB POTENTIAL: Good  CLINICAL DECISION MAKING: Evolving/moderate complexity  EVALUATION COMPLEXITY: Moderate   GOALS: Goals reviewed with patient? No  SHORT TERM GOALS: Target date: 08/27/24  Patient will demonstrate evidence of independence with individualized HEP and will report compliance for at least 3 days per week for optimized progression towards remaining therapy goals. Baseline:  Goal status: INITIAL  2.  Patient will report a decrease in pain level during community ambulation by at least 2 points for improved quality of life. Baseline:  Goal status: INITIAL     LONG TERM GOALS: Target date: 10/01/24  Pt will demonstrate a  an increase of at least 9  points on the LEFS for improved performance of community ambulation and ADL. Baseline:  Goal status: INITIAL  2.  Pt will be able to navigate at least 3 steps with a reciprocal pattern for improved function with household mobility. Baseline:   Goal status: INITIAL  3.  Pt will ambulate with no significant gait deviations for improved community mobility.  Baseline:   Goal status: INITIAL  4.  Patient will improve her right knee flexion to at least 115 degrees for improved function navigating steps.  Baseline:   Goal status: INITIAL  5.  Patient will improve her right knee extension to within 5 degrees of neutral for improved gait mechanics.  Baseline:   Goal status: INITIAL    PLAN:  PT FREQUENCY: 1-2x/week  PT DURATION: other: 7 weeks  PLANNED INTERVENTIONS: [id number]- PT Re-evaluation, 97750- Physical Performance Testing, 97110-Therapeutic exercises, 97530- Therapeutic activity, [id number]- Neuromuscular re-education, 97535- Self Care, [id number]- Manual therapy, [id number]- Gait training, [phone]- Electrical stimulation (unattended), 97016- Vasopneumatic device, Patient/Family education, Balance training, Stair training, Joint mobilization, Cryotherapy, and Moist heat  PLAN FOR NEXT SESSION: update HEP as needed. Progress AROM and PROM right knee flexion and extension.    Lang Ada, PT, DPT [facility] Office: [phone] 12:04 PM, 08/30/24

## 2024-09-04 ENCOUNTER — Ambulatory Visit (HOSPITAL_COMMUNITY): Admitting: Physical Therapy

## 2024-09-04 DIAGNOSIS — M25661 Stiffness of right knee, not elsewhere classified: Secondary | ICD-10-CM

## 2024-09-04 DIAGNOSIS — M25561 Pain in right knee: Secondary | ICD-10-CM

## 2024-09-04 DIAGNOSIS — R600 Localized edema: Secondary | ICD-10-CM

## 2024-09-04 DIAGNOSIS — R262 Difficulty in walking, not elsewhere classified: Secondary | ICD-10-CM

## 2024-09-04 NOTE — Therapy (Signed)
 OUTPATIENT PHYSICAL THERAPY LOWER EXTREMITY TREATMENT   Patient Name: [name] MRN: [id number] DOB:[birth year], 63 y.o., female Today's Date: 09/04/2024  END OF SESSION:  PT End of Session - 09/04/24 1544     Visit Number 6    Number of Visits 14    Date for Recertification  10/05/24    Authorization Type [employer]    Authorization Time Period No auth    Progress Note Due on Visit 10    PT Start Time 1518    PT Stop Time 1557    PT Time Calculation (min) 39 min    Activity Tolerance Patient limited by pain;Patient tolerated treatment well    Behavior During Therapy [facility] for tasks assessed/performed              Past Medical History:  Diagnosis Date   Anxiety    Arthritis    Breast cancer (HCC)    Status post bilateral mastectomy   Breast reconstruction deformity    Complication of anesthesia    shaking-after mastectomies-resp and hr dropped   Depression, major    suicidal thoughts -implants, mental health, 05/11   Dyspnea    Fibromyalgia    GERD (gastroesophageal reflux disease)    Hypothyroidism    Narcolepsy    OSA on CPAP    PONV (postoperative nausea and vomiting)    Sleep apnea with use of continuous positive airway pressure (CPAP)    AHI 20 in HST, titration to 6 cm water  - residual AHi 6 . remaining hypersomnic.    Past Surgical History:  Procedure Laterality Date   ARTHRODESIS METATARSALPHALANGEAL JOINT (MTPJ) Right 07/28/2017   Procedure: ARTHRODESIS  RIGHT HALLUX METATARSALPHALANGEAL JOINT (MTPJ);  Surgeon: Kit Rush, MD;  Location: [facility];  Service: Orthopedics;  Laterality: Right;   Breast reconstructioin  7/09   flaps-bilat mastectomies   BTL     CARPAL TUNNEL RELEASE  2011   both hands   CARPOMETACARPEL SUSPENSION PLASTY Right 08/29/2014   Procedure: REMOVAL DERMASPAN SUSPENSIONPLASTY RIGHT THUMB ABDUCTOR POLLIS TRANSFER;  Surgeon: Arley Curia, MD;  Location: [facility];  Service:  Orthopedics;  Laterality: Right;   CATARACT EXTRACTION W/PHACO Right 12/17/2019   Procedure: CATARACT EXTRACTION PHACO AND INTRAOCULAR LENS PLACEMENT (IOC);  Surgeon: Harrie Agent, MD;  Location: [facility];  Service: Ophthalmology;  Laterality: Right;  CDE: 6.54   CATARACT EXTRACTION W/PHACO Left 12/31/2019   Procedure: CATARACT EXTRACTION PHACO AND INTRAOCULAR LENS PLACEMENT (IOC);  Surgeon: Harrie Agent, MD;  Location: [facility];  Service: Ophthalmology;  Laterality: Left;  CDE: 3.66   COLONOSCOPY W/ POLYPECTOMY  4/03   tubulovillous adenoma   FINGER ARTHROSCOPY WITH CARPOMETACARPEL (CMC) ARTHROPLASTY Right 06/20/2014   Procedure: ARTHROSCOPY RIGHT THUMB CARPOMETACARPEL GRAFT JACKET INTERPOSITION PARTIAL TRAPEZIECTOMY POSSIBLE OPEN;  Surgeon: Arley Curia, MD;  Location: [facility];  Service: Orthopedics;  Laterality: Right;   GANGLION CYST EXCISION     both [phone]   HAMMER TOE SURGERY Right 07/28/2017   Procedure: THIRD HAMMERTOE CORRECTION;  Surgeon: Kit Rush, MD;  Location: [facility];  Service: Orthopedics;  Laterality: Right;   HARDWARE REMOVAL Right 07/28/2017   Procedure: REMOVAL OF DEEP IMPLANTS;  Surgeon: Kit Rush, MD;  Location:  S[facility]ERY [facility];  Service: Orthopedics;  Laterality: Right;   hysterestomy  2005   KNEE ARTHROPLASTY Left 09/15/2022   Procedure: COMPUTER ASSISTED TOTAL KNEE ARTHROPLASTY;  Surgeon: Fidel Rogue, MD;  Location: [facility];  Service: Orthopedics;  Laterality:  Left;   KNEE ARTHROPLASTY Right 08/02/2024   Procedure: ARTHROPLASTY, KNEE, TOTAL, USING IMAGELESS COMPUTER-ASSISTED NAVIGATION;  Surgeon: Fidel Rogue, MD;  Location: [facility];  Service: Orthopedics;  Laterality: Right;   MASTECTOMY Bilateral 2008   post- a -cath  2008   right knee arthroscopy     TOTAL SHOULDER ARTHROPLASTY Left 01/13/2017   Procedure: LEFT TOTAL SHOULDER ARTHROPLASTY;  Surgeon: Eva Herring, MD;  Location: [facility];  Service: Orthopedics;   Laterality: Left;  LEFT TOTAL SHOULDER ARTHROPLASTY   TOTAL SHOULDER ARTHROPLASTY Right 02/11/2022   Procedure: TOTAL SHOULDER ARTHROPLASTY;  Surgeon: Sharl Selinda Dover, MD;  [facility];  Service: Orthopedics;  Laterality: Right;  150   TRIGGER FINGER RELEASE Right 08/29/2014   Procedure: RELEASE TRIGGER FINGER/A-1 PULLEY;  Surgeon: Arley Curia, MD;  Location: [facility];  Service: Orthopedics;  Laterality: Right;   wisdom teeth out     Patient Active Problem List   Diagnosis Date Noted   Osteoarthritis of right knee 08/02/2024   S/P total knee arthroplasty, right 08/02/2024   Osteoarthritis of left knee 09/15/2022   History of total shoulder replacement, right 02/11/2022   Osteoarthritis of left shoulder 01/13/2017   S/P shoulder replacement, left 01/13/2017   OSA on CPAP 04/01/2015   Subacute confusional state 04/01/2015   Hallucinations, visual 04/01/2015   Hypnapompic hallucinations 04/01/2015   Sleep apnea with use of continuous positive airway pressure (CPAP)    Hypersomnia, persistent 03/30/2013   Obstructive sleep apnea (adult) (pediatric) 03/30/2013   Constipation 11/18/2008   RECTAL BLEEDING 11/18/2008   Internal hemorrhoids 11/15/2008   Diverticulosis of colon 11/15/2008   CELLULITIS/ABSCESS, TRUNK 02/22/2008   LOW BACK PAIN 02/22/2008   DIZZINESS 02/22/2008   FATIGUE 02/22/2008   PALPITATIONS 02/22/2008   MURMUR, CARDIAC, UNDIAGNOSED 02/22/2008   BREAST CANCER, HX OF 02/22/2008   COUGH 01/05/2008   DEPRESSION 07/05/2007   UTERINE POLYP 07/05/2007   FIBROMYALGIA 07/05/2007   History of colonic polyps 07/05/2007   HX, PERSONAL, CERVICAL DYSPLASIA 07/05/2007    PCP: Loreli Elsie JONETTA Mickey., MD  REFERRING PROVIDER: Leigh Valery RAMAN, PA-C  REFERRING DIAG: F82.88 (ICD-10-CM) - Unilateral primary osteoarthritis, right knee  THERAPY DIAG:  Acute pain of right knee  Stiffness of right knee, not elsewhere classified  Difficulty in walking, not  elsewhere classified  Localized edema  Rationale for Evaluation and Treatment: Rehabilitation  ONSET DATE: 08/02/24  SUBJECTIVE:   SUBJECTIVE STATEMENT: Pt comes today without AD and only slight antalgia due to ROM.  Minimal pain reported.    Eval: Patient reports that she had a right knee replacement on 08/02/24. She notes that it has been swelling up quite a bit and hurting since surgery. She was not given any exercise to do after surgery. She notes that her muscle relaxer's and pain medication are helping some.   PERTINENT HISTORY: OA, fibromyalgia, depression, history of cancer, and current smoker PAIN:  Are you having pain? Yes: NPRS scale: Current:5/10 Best: 4/10 Worst: 8/10 Pain location: right knee and leg Pain description: burning, throbbing, and aching  Aggravating factors: bending her knee, movement Relieving factors: medication and ice  PRECAUTIONS: Fall  RED FLAGS: None   WEIGHT BEARING RESTRICTIONS: No  FALLS:  Has patient fallen in last 6 months? Yes. Number of falls 4-5; most recent was last week when she fell out of bed into the floor  LIVING ENVIRONMENT: Lives with: lives with their family Lives in: House/apartment Stairs: Yes: External: 8 steps; can reach both Has  following equipment at home: Vannie - 2 wheeled  OCCUPATION: not working  PLOF: Independent  PATIENT GOALS: improved mobility and reduced pain  NEXT MD VISIT: 08/20/24  OBJECTIVE:  Note: Objective measures were completed at Evaluation unless otherwise noted.  DIAGNOSTIC FINDINGS: EXAM: 1 or 2 VIEW(S) XRAY OF THE RIGHT KNEE 08/02/2024 03:02:00 PM   COMPARISON: None available.   CLINICAL HISTORY: S/P total knee arthroplasty, right.   FINDINGS:   BONES AND JOINTS: Right total knee arthroplasty in place. Gas in joint space compatible with immediate postoperative state. No acute fracture. No focal osseous lesion. No joint dislocation. No significant joint effusion.   SOFT  TISSUES: Gas in overlying soft tissues compatible with immediate postoperative state.   IMPRESSION: 1. Right total knee arthroplasty with expected immediate postoperative appearance. 2. Intra-articular and soft tissue gas compatible with immediate postoperative state.  PATIENT SURVEYS:  LEFS: 12 / 80 = 15.0 %  COGNITION: Overall cognitive status: Within functional limits for tasks assessed     SENSATION: WFL  EDEMA:  Circumferential: L tibiofemoral joint line: 40.5 cm R tibiofemoral joint line: 43.4   PALPATION: TTP: globally around right knee and lower leg  LOWER EXTREMITY ROM:  Active ROM Right eval Left eval Right 09/04/24  Hip flexion     Hip extension     Hip abduction     Hip adduction     Hip internal rotation     Hip external rotation     Knee flexion 47 PROM: 60 118 90  Knee extension 8 0 -5  Ankle dorsiflexion     Ankle plantarflexion     Ankle inversion     Ankle eversion      (Blank rows = not tested)  LOWER EXTREMITY MMT: not tested due to surgical condition  MMT Right eval Left eval  Hip flexion    Hip extension    Hip abduction    Hip adduction    Hip internal rotation    Hip external rotation    Knee flexion    Knee extension    Ankle dorsiflexion    Ankle plantarflexion    Ankle inversion    Ankle eversion     (Blank rows = not tested)  FUNCTIONAL TESTS:  STS: avoids WB through RLE  GAIT: Assistive device utilized: [employer]ant - 2 wheeled Level of assistance: Modified independence Comments: step through pattern with R knee flexed in stance phase                                                                                                                                 TREATMENT DATE:  09/04/24 Bike seat 10 rocking 4 minutes Standing:  Rt knee flexion 12 step for increasing flexion 5X10   Rt knee hamstring curl 10X Seated: heelslides with towel 10X  LAQ 2X10 with 3 holds Sit to stands no UE feet even 10X Supine:  quad  sets 10X 5 RT  SAQ 10X5  SLR with QS 5X Manual to Rt knee to reduce tightness/scar tissue (lateral knee with most) AROM Rt -5 to 90  08/30/2024  Manual Therapy: -Patellar mobs of R knee, inferior/superior/medial/lateral, grade II-III mobilizations -STM of R knee, avoidance of incisional site today Therapeutic Exercise: -Supine heel slides AAROM 52 degrees of flexion obtained, 2 sets of 20 reps, pt cued for max ROM -Quad sets with towel underneath knee, 2 sets of 10 reps, pt cued to hold contraction for 3 seconds  -Supine bridges, 1 set of 8 reps, pt cued for form and 3 second holds -Standing heel raises, 2 sets of 10 reps, pt cued for max ROM -Supine DKTC on green exercise ball, 1 set of 8 reps, pt cued for use of LLE for assistance for R knee flexion Therapeutic Activity: -Step up and overs, 8 inch step, 1 set of 5 reps,pt cued for sequencing -Lateral step up and overs, 8 inch step, 1 set of 5 reps, pt cued for sequencing  08/28/24 Bike seat 12 half rock 4 minutes Standing:  Rt knee flexion 12 step for increasing flexion 5X10   Rt knee hamstring curl 10X Seated: heelslides with towel 10X  LAQ 2X10 with 3 holds Sit to stands no UE feet even 10X Supine:  quad sets 10X 5 RT (pt with pain, difficulty doing these) AROM Rt -5 to 80    08/23/24: Bike, seat 14, 4', half revolutions Seated heel slides with strap, pillowcase on foot, 5 holds, 2' AAROM w/ strap: 81 degrees flexion AROM: 75 deg flexion SAQ, 2x10 LAQ, 3 holds, 2x10 STS, 2 sets of 5, no UE use Standing heel raises, 10x Standing hamstring curls, 10x on R  PATIENT EDUCATION:  Education details: POC, healing, edema, HEP, and expectations following surgery  Person educated: Patient Education method: Explanation Education comprehension: verbalized understanding  HOME EXERCISE PROGRAM: Access Code: [email or web address] URL: [email or web address] Date: 08/13/2024 Prepared by: Lacinda Fass  Exercises -  Supine Ankle Pumps  - 1 x daily - 7 x weekly - 2 sets - 10 reps - Supine Quad Set  - 1 x daily - 7 x weekly - 2 sets - 10 reps - 3-5 seconds hold - Supine Heel Slide  - 1 x daily - 7 x weekly - 2 sets - 10 reps  ASSESSMENT:  CLINICAL IMPRESSION: Patient with noted improvement in gait and less pain voiced upon arrival.  Began with bike with increased ROM, nearly able to make full revolution even with seat closer today.  Added hamstring stretch in standing using 12 step and overpressure with good results.  Pt with improved quad set and ability to complete SLR this session.  No pain voiced with therex today as had previous session.  Continues to have scar tissue and tenderness at lateral knee.  Encouraged to complete self massage and to continue to push her ROM.   Increased ROM to 107 degrees today. Patient will continue to benefit from skilled physical therapy for decreased R knee pain, increased endurance with ambulation, increased R knee strength/ROM, and improved balance for improved quality of life, improved independence with gait training and continued progress towards therapy goals.       OBJECTIVE IMPAIRMENTS: Abnormal gait, decreased activity tolerance, decreased balance, decreased knowledge of condition, decreased mobility, difficulty walking, decreased ROM, decreased strength, hypomobility, increased edema, impaired flexibility, impaired tone, and pain.   ACTIVITY LIMITATIONS: lifting, sitting, standing, squatting, stairs, transfers, bed mobility, and locomotion level  PARTICIPATION LIMITATIONS: shopping and community activity  PERSONAL FACTORS: Past/current experiences, Time since onset of injury/illness/exacerbation, and 3+ comorbidities: OA, fibromyalgia, depression, history of cancer, and current smoker are also affecting patient's functional outcome.   REHAB POTENTIAL: Good  CLINICAL DECISION MAKING: Evolving/moderate complexity  EVALUATION COMPLEXITY:  Moderate   GOALS: Goals reviewed with patient? No  SHORT TERM GOALS: Target date: 08/27/24  Patient will demonstrate evidence of independence with individualized HEP and will report compliance for at least 3 days per week for optimized progression towards remaining therapy goals. Baseline:  Goal status: INITIAL  2.  Patient will report a decrease in pain level during community ambulation by at least 2 points for improved quality of life. Baseline:  Goal status: INITIAL     LONG TERM GOALS: Target date: 10/01/24  Pt will demonstrate a an increase of at least 9 points on the LEFS for improved performance of community ambulation and ADL. Baseline:  Goal status: INITIAL  2.  Pt will be able to navigate at least 3 steps with a reciprocal pattern for improved function with household mobility. Baseline:   Goal status: INITIAL  3.  Pt will ambulate with no significant gait deviations for improved community mobility.  Baseline:   Goal status: INITIAL  4.  Patient will improve her right knee flexion to at least 115 degrees for improved function navigating steps.  Baseline:   Goal status: INITIAL  5.  Patient will improve her right knee extension to within 5 degrees of neutral for improved gait mechanics.  Baseline:   Goal status: INITIAL    PLAN:  PT FREQUENCY: 1-2x/week  PT DURATION: other: 7 weeks  PLANNED INTERVENTIONS: [id number]- PT Re-evaluation, 97750- Physical Performance Testing, 97110-Therapeutic exercises, 97530- Therapeutic activity, [id number]- Neuromuscular re-education, 97535- Self Care, [id number]- Manual therapy, [id number]- Gait training, [phone]- Electrical stimulation (unattended), 97016- Vasopneumatic device, Patient/Family education, Balance training, Stair training, Joint mobilization, Cryotherapy, and Moist heat  PLAN FOR NEXT SESSION: update HEP as needed. Progress AROM and PROM right knee flexion and extension.    Greig KATHEE Fuse, PTA/CLT [facility] Ph: [phone] 3:45 PM, 09/04/24

## 2024-09-06 ENCOUNTER — Ambulatory Visit (HOSPITAL_COMMUNITY): Admitting: Physical Therapy

## 2024-09-06 DIAGNOSIS — M25661 Stiffness of right knee, not elsewhere classified: Secondary | ICD-10-CM

## 2024-09-06 DIAGNOSIS — M25561 Pain in right knee: Secondary | ICD-10-CM

## 2024-09-06 DIAGNOSIS — R600 Localized edema: Secondary | ICD-10-CM

## 2024-09-06 DIAGNOSIS — R262 Difficulty in walking, not elsewhere classified: Secondary | ICD-10-CM

## 2024-09-06 NOTE — Therapy (Signed)
 OUTPATIENT PHYSICAL THERAPY LOWER EXTREMITY TREATMENT   Patient Name: [name] MRN: [id number] DOB:1960-11-17, 63 y.o., female Today's Date: 09/06/2024  END OF SESSION:  PT End of Session - 09/06/24 1502     Visit Number 7    Number of Visits 14    Date for Recertification  10/05/24    Authorization Type [employer] [place] Preferred    Authorization Time Period No auth    Progress Note Due on Visit 10    PT Start Time 1420    PT Stop Time 1500    PT Time Calculation (min) 40 min    Activity Tolerance Patient limited by pain;Patient tolerated treatment well    Behavior During Therapy [facility] for tasks assessed/performed               Past Medical History:  Diagnosis Date   Anxiety    Arthritis    Breast cancer (HCC)    Status post bilateral mastectomy   Breast reconstruction deformity    Complication of anesthesia    shaking-after mastectomies-resp and hr dropped   Depression, major    suicidal thoughts -implants, mental health, 05/11   Dyspnea    Fibromyalgia    GERD (gastroesophageal reflux disease)    Hypothyroidism    Narcolepsy    OSA on CPAP    PONV (postoperative nausea and vomiting)    Sleep apnea with use of continuous positive airway pressure (CPAP)    AHI 20 in HST, titration to 6 cm water  - residual AHi 6 . remaining hypersomnic.    Past Surgical History:  Procedure Laterality Date   ARTHRODESIS METATARSALPHALANGEAL JOINT (MTPJ) Right 07/28/2017   Procedure: ARTHRODESIS  RIGHT HALLUX METATARSALPHALANGEAL JOINT (MTPJ);  Surgeon: Kit Rush, MD;  Location: [facility];  Service: Orthopedics;  Laterality: Right;   Breast reconstructioin  7/09   flaps-bilat mastectomies   BTL     CARPAL TUNNEL RELEASE  2011   both hands   CARPOMETACARPEL SUSPENSION PLASTY Right 08/29/2014   Procedure: REMOVAL DERMASPAN SUSPENSIONPLASTY RIGHT THUMB ABDUCTOR POLLIS TRANSFER;  Surgeon: Arley Curia, MD;  Location: [facility];  Service:  Orthopedics;  Laterality: Right;   CATARACT EXTRACTION W/PHACO Right 12/17/2019   Procedure: CATARACT EXTRACTION PHACO AND INTRAOCULAR LENS PLACEMENT (IOC);  Surgeon: Harrie Agent, MD;  Location: [facility];  Service: Ophthalmology;  Laterality: Right;  CDE: 6.54   CATARACT EXTRACTION W/PHACO Left 12/31/2019   Procedure: CATARACT EXTRACTION PHACO AND INTRAOCULAR LENS PLACEMENT (IOC);  Surgeon: Harrie Agent, MD;  Location: [facility];  Service: Ophthalmology;  Laterality: Left;  CDE: 3.66   COLONOSCOPY W/ POLYPECTOMY  4/03   tubulovillous adenoma   FINGER ARTHROSCOPY WITH CARPOMETACARPEL (CMC) ARTHROPLASTY Right 06/20/2014   Procedure: ARTHROSCOPY RIGHT THUMB CARPOMETACARPEL GRAFT JACKET INTERPOSITION PARTIAL TRAPEZIECTOMY POSSIBLE OPEN;  Surgeon: Arley Curia, MD;  Location: [facility];  Service: Orthopedics;  Laterality: Right;   GANGLION CYST EXCISION     both [phone]   HAMMER TOE SURGERY Right 07/28/2017   Procedure: THIRD HAMMERTOE CORRECTION;  Surgeon: Kit Rush, MD;  Location: [facility];  Service: Orthopedics;  Laterality: Right;   HARDWARE REMOVAL Right 07/28/2017   Procedure: REMOVAL OF DEEP IMPLANTS;  Surgeon: Kit Rush, MD;  Location: [facility];  Service: Orthopedics;  Laterality: Right;   hysterestomy  2005   KNEE ARTHROPLASTY Left 09/15/2022   Procedure: COMPUTER ASSISTED TOTAL KNEE ARTHROPLASTY;  Surgeon: Fidel Rogue, MD;  Location: [facility];  Service: Orthopedics;  Laterality: Left;   KNEE ARTHROPLASTY Right 08/02/2024   Procedure: ARTHROPLASTY, KNEE, TOTAL, USING IMAGELESS COMPUTER-ASSISTED NAVIGATION;  Surgeon: Fidel Rogue, MD;  Location: [facility];  Service: Orthopedics;  Laterality: Right;   MASTECTOMY Bilateral 2008   post- a -cath  2008   right knee arthroscopy     TOTAL SHOULDER ARTHROPLASTY Left 01/13/2017   Procedure: LEFT TOTAL SHOULDER ARTHROPLASTY;  Surgeon: Eva Herring, MD;  Location: [facility];  Service: Orthopedics;   Laterality: Left;  LEFT TOTAL SHOULDER ARTHROPLASTY   TOTAL SHOULDER ARTHROPLASTY Right 02/11/2022   Procedure: TOTAL SHOULDER ARTHROPLASTY;  Surgeon: Sharl Selinda Dover, MD;  Location: [facility];  Service: Orthopedics;  Laterality: Right;  150   TRIGGER FINGER RELEASE Right 08/29/2014   Procedure: RELEASE TRIGGER FINGER/A-1 PULLEY;  Surgeon: Arley Curia, MD;  Location: [facility];  Service: Orthopedics;  Laterality: Right;   wisdom teeth out     Patient Active Problem List   Diagnosis Date Noted   Osteoarthritis of right knee 08/02/2024   S/P total knee arthroplasty, right 08/02/2024   Osteoarthritis of left knee 09/15/2022   History of total shoulder replacement, right 02/11/2022   Osteoarthritis of left shoulder 01/13/2017   S/P shoulder replacement, left 01/13/2017   OSA on CPAP 04/01/2015   Subacute confusional state 04/01/2015   Hallucinations, visual 04/01/2015   Hypnapompic hallucinations 04/01/2015   Sleep apnea with use of continuous positive airway pressure (CPAP)    Hypersomnia, persistent 03/30/2013   Obstructive sleep apnea (adult) (pediatric) 03/30/2013   Constipation 11/18/2008   RECTAL BLEEDING 11/18/2008   Internal hemorrhoids 11/15/2008   Diverticulosis of colon 11/15/2008   CELLULITIS/ABSCESS, TRUNK 02/22/2008   LOW BACK PAIN 02/22/2008   DIZZINESS 02/22/2008   FATIGUE 02/22/2008   PALPITATIONS 02/22/2008   MURMUR, CARDIAC, UNDIAGNOSED 02/22/2008   BREAST CANCER, HX OF 02/22/2008   COUGH 01/05/2008   DEPRESSION 07/05/2007   UTERINE POLYP 07/05/2007   FIBROMYALGIA 07/05/2007   History of colonic polyps 07/05/2007   HX, PERSONAL, CERVICAL DYSPLASIA 07/05/2007    PCP: Loreli Elsie JONETTA Mickey., MD  REFERRING PROVIDER: Leigh Valery RAMAN, PA-C  REFERRING DIAG: F82.88 (ICD-10-CM) - Unilateral primary osteoarthritis, right knee  THERAPY DIAG:  Acute pain of right knee  Stiffness of right knee, not elsewhere classified  Difficulty in walking, not  elsewhere classified  Localized edema  Rationale for Evaluation and Treatment: Rehabilitation  ONSET DATE: 08/02/24  SUBJECTIVE:   SUBJECTIVE STATEMENT: Pt continues to ambulate without AD.  Admits to not doing all exercises.  LE not spasmming like it was and no longer keeping her up at night as it was. 4/10 pain currently.  States she was sore the next day after therapy.    Eval: Patient reports that she had a right knee replacement on 08/02/24. She notes that it has been swelling up quite a bit and hurting since surgery. She was not given any exercise to do after surgery. She notes that her muscle relaxer's and pain medication are helping some.   PERTINENT HISTORY: OA, fibromyalgia, depression, history of cancer, and current smoker PAIN:  Are you having pain? Yes: NPRS scale: Current:4/10 Pain location: right knee and leg Pain description: burning, throbbing, and aching  Aggravating factors: bending her knee, movement Relieving factors: medication and ice  PRECAUTIONS: Fall  RED FLAGS: None   WEIGHT BEARING RESTRICTIONS: No  FALLS:  Has patient fallen in last 6 months? Yes. Number of falls 4-5; most recent was last week when she fell out of  bed into the floor  LIVING ENVIRONMENT: Lives with: lives with their family Lives in: House/apartment Stairs: Yes: External: 8 steps; can reach both Has following equipment at home: Walker - 2 wheeled  OCCUPATION: not working  PLOF: Independent  PATIENT GOALS: improved mobility and reduced pain  NEXT MD VISIT: 08/20/24  OBJECTIVE:  Note: Objective measures were completed at Evaluation unless otherwise noted.  DIAGNOSTIC FINDINGS: EXAM: 1 or 2 VIEW(S) XRAY OF THE RIGHT KNEE 08/02/2024 03:02:00 PM   COMPARISON: None available.   CLINICAL HISTORY: S/P total knee arthroplasty, right.   FINDINGS:   BONES AND JOINTS: Right total knee arthroplasty in place. Gas in joint space compatible with immediate postoperative  state. No acute fracture. No focal osseous lesion. No joint dislocation. No significant joint effusion.   SOFT TISSUES: Gas in overlying soft tissues compatible with immediate postoperative state.   IMPRESSION: 1. Right total knee arthroplasty with expected immediate postoperative appearance. 2. Intra-articular and soft tissue gas compatible with immediate postoperative state.  PATIENT SURVEYS:  LEFS: 12 / 80 = 15.0 %  COGNITION: Overall cognitive status: Within functional limits for tasks assessed     SENSATION: WFL  EDEMA:  Circumferential: L tibiofemoral joint line: 40.5 cm R tibiofemoral joint line: 43.4   PALPATION: TTP: globally around right knee and lower leg  LOWER EXTREMITY ROM:  Active ROM Right eval Left eval Right 09/04/24  Hip flexion     Hip extension     Hip abduction     Hip adduction     Hip internal rotation     Hip external rotation     Knee flexion 47 PROM: 60 118 90  Knee extension 8 0 -5  Ankle dorsiflexion     Ankle plantarflexion     Ankle inversion     Ankle eversion      (Blank rows = not tested)  LOWER EXTREMITY MMT: not tested due to surgical condition  MMT Right eval Left eval  Hip flexion    Hip extension    Hip abduction    Hip adduction    Hip internal rotation    Hip external rotation    Knee flexion    Knee extension    Ankle dorsiflexion    Ankle plantarflexion    Ankle inversion    Ankle eversion     (Blank rows = not tested)  FUNCTIONAL TESTS:  STS: avoids WB through RLE  GAIT: Assistive device utilized: Environmental [employer]ant - 2 wheeled Level of assistance: Modified independence Comments: step through pattern with R knee flexed in stance phase                                                                                                                                 TREATMENT DATE:  09/06/24 Bike seat 10 rocking 4 minutes Standing:  Rt knee flexion 12 step for increasing flexion 5X10   Rt knee hamstring curl  10X  Hip  abduction 10X each LE Supine:  quad sets 10X 5 RT   SAQ 10X5  Bridge 10X Manual to Rt knee to reduce tightness/scar tissue (lateral knee with most) AROM Rt -3 to 100 Measured Rt LE and given ETI sheet    09/04/24 Bike seat 10 rocking 4 minutes Standing:  Rt knee flexion 12 step for increasing flexion 5X10   Rt knee hamstring curl 10X Seated: heelslides with towel 10X  LAQ 2X10 with 3 holds Sit to stands no UE feet even 10X Supine:  quad sets 10X 5 RT   SAQ 10X5  SLR with QS 5X Manual to Rt knee to reduce tightness/scar tissue (lateral knee with most) AROM Rt -5 to 90  08/30/2024  Manual Therapy: -Patellar mobs of R knee, inferior/superior/medial/lateral, grade II-III mobilizations -STM of R knee, avoidance of incisional site today Therapeutic Exercise: -Supine heel slides AAROM 52 degrees of flexion obtained, 2 sets of 20 reps, pt cued for max ROM -Quad sets with towel underneath knee, 2 sets of 10 reps, pt cued to hold contraction for 3 seconds  -Supine bridges, 1 set of 8 reps, pt cued for form and 3 second holds -Standing heel raises, 2 sets of 10 reps, pt cued for max ROM -Supine DKTC on green exercise ball, 1 set of 8 reps, pt cued for use of LLE for assistance for R knee flexion Therapeutic Activity: -Step up and overs, 8 inch step, 1 set of 5 reps,pt cued for sequencing -Lateral step up and overs, 8 inch step, 1 set of 5 reps, pt cued for sequencing  08/28/24 Bike seat 12 half rock 4 minutes Standing:  Rt knee flexion 12 step for increasing flexion 5X10   Rt knee hamstring curl 10X Seated: heelslides with towel 10X  LAQ 2X10 with 3 holds Sit to stands no UE feet even 10X Supine:  quad sets 10X 5 RT (pt with pain, difficulty doing these) AROM Rt -5 to 80    08/23/24: Bike, seat 14, 4', half revolutions Seated heel slides with strap, pillowcase on foot, 5 holds, 2' AAROM w/ strap: 81 degrees flexion AROM: 75 deg flexion SAQ, 2x10 LAQ,  3 holds, 2x10 STS, 2 sets of 5, no UE use Standing heel raises, 10x Standing hamstring curls, 10x on R  PATIENT EDUCATION:  Education details: POC, healing, edema, HEP, and expectations following surgery  Person educated: Patient Education method: Explanation Education comprehension: verbalized understanding  HOME EXERCISE PROGRAM: Access Code: [email or web address] URL: [email or web address] Date: 08/13/2024 Prepared by: Lacinda Fass  Exercises - Supine Ankle Pumps  - 1 x daily - 7 x weekly - 2 sets - 10 reps - Supine Quad Set  - 1 x daily - 7 x weekly - 2 sets - 10 reps - 3-5 seconds hold - Supine Heel Slide  - 1 x daily - 7 x weekly - 2 sets - 10 reps  ASSESSMENT:  CLINICAL IMPRESSION: Patient with continued improvement noted, slightly increased mobility speed today.  Began on bike with several full revolutions achieved, however was leaning body to Lt to completed.  Continued with rocking both directions with holds in end ROM.  Scar visible beneath pants, continued with manual to help reduce this prior to ROM measurements. Again, encouraged to remain more vigilant with HEP, complete self massage and to continue to push her ROM.   Increased AROM -3 to 100  following manual, this is an improvement  in each direction. Pt request information for ETI to order thigh high compression.  LE measured and information sheet given.  Patient will continue to benefit from skilled physical therapy for decreased R knee pain, increased endurance with ambulation, increased R knee strength/ROM, and improved balance for improved quality of life, improved independence with gait training and continued progress towards therapy goals.     OBJECTIVE IMPAIRMENTS: Abnormal gait, decreased activity tolerance, decreased balance, decreased knowledge of condition, decreased mobility, difficulty walking, decreased ROM, decreased strength, hypomobility, increased edema, impaired flexibility, impaired tone, and  pain.   ACTIVITY LIMITATIONS: lifting, sitting, standing, squatting, stairs, transfers, bed mobility, and locomotion level  PARTICIPATION LIMITATIONS: shopping and community activity  PERSONAL FACTORS: Past/current experiences, Time since onset of injury/illness/exacerbation, and 3+ comorbidities: OA, fibromyalgia, depression, history of cancer, and current smoker are also affecting patient's functional outcome.   REHAB POTENTIAL: Good  CLINICAL DECISION MAKING: Evolving/moderate complexity  EVALUATION COMPLEXITY: Moderate   GOALS: Goals reviewed with patient? No  SHORT TERM GOALS: Target date: 08/27/24  Patient will demonstrate evidence of independence with individualized HEP and will report compliance for at least 3 days per week for optimized progression towards remaining therapy goals. Baseline:  Goal status: INITIAL  2.  Patient will report a decrease in pain level during community ambulation by at least 2 points for improved quality of life. Baseline:  Goal status: INITIAL     LONG TERM GOALS: Target date: 10/01/24  Pt will demonstrate a an increase of at least 9 points on the LEFS for improved performance of community ambulation and ADL. Baseline:  Goal status: INITIAL  2.  Pt will be able to navigate at least 3 steps with a reciprocal pattern for improved function with household mobility. Baseline:   Goal status: INITIAL  3.  Pt will ambulate with no significant gait deviations for improved community mobility.  Baseline:   Goal status: INITIAL  4.  Patient will improve her right knee flexion to at least 115 degrees for improved function navigating steps.  Baseline:   Goal status: INITIAL  5.  Patient will improve her right knee extension to within 5 degrees of neutral for improved gait mechanics.  Baseline:   Goal status: INITIAL    PLAN:  PT FREQUENCY: 1-2x/week  PT DURATION: other: 7 weeks  PLANNED INTERVENTIONS: 97164- PT Re-evaluation, 97750-  Physical Performance Testing, 97110-Therapeutic exercises, 97530- Therapeutic activity, [id number]- Neuromuscular re-education, 97535- Self Care, [id number]- Manual therapy, [id number]- Gait training, [phone]- Electrical stimulation (unattended), 97016- Vasopneumatic device, Patient/Family education, Balance training, Stair training, Joint mobilization, Cryotherapy, and Moist heat  PLAN FOR NEXT SESSION: update HEP as needed. Progress AROM and PROM right knee flexion and extension.    Greig KATHEE Fuse, PTA/CLT [facility] Ph: [phone] 3:03 PM, 09/06/24

## 2024-09-11 ENCOUNTER — Encounter (HOSPITAL_COMMUNITY): Admitting: Physical Therapy

## 2024-09-13 ENCOUNTER — Encounter (HOSPITAL_COMMUNITY): Admitting: Physical Therapy

## 2024-09-18 ENCOUNTER — Ambulatory Visit (HOSPITAL_COMMUNITY): Attending: Student | Admitting: Physical Therapy

## 2024-09-18 DIAGNOSIS — M25661 Stiffness of right knee, not elsewhere classified: Secondary | ICD-10-CM | POA: Insufficient documentation

## 2024-09-18 DIAGNOSIS — R600 Localized edema: Secondary | ICD-10-CM | POA: Insufficient documentation

## 2024-09-18 DIAGNOSIS — R262 Difficulty in walking, not elsewhere classified: Secondary | ICD-10-CM | POA: Diagnosis present

## 2024-09-18 DIAGNOSIS — M25561 Pain in right knee: Secondary | ICD-10-CM | POA: Insufficient documentation

## 2024-09-18 NOTE — Therapy (Signed)
 OUTPATIENT PHYSICAL THERAPY LOWER EXTREMITY TREATMENT   Patient Name: [name] MRN: [id number] DOB:08/30/1961, 63 y.o., female Today's Date: 09/18/2024  END OF SESSION:  PT End of Session - 09/18/24 1423     Visit Number 8    Number of Visits 14    Date for Recertification  10/05/24    Authorization Type [employer] [place] [employer]    Authorization Time Period No auth    Progress Note Due on Visit 10    PT Start Time 1422    PT Stop Time 1500    PT Time Calculation (min) 38 min    Activity Tolerance Patient limited by pain;Patient tolerated treatment well    Behavior During Therapy [facility] for tasks assessed/performed                Past Medical History:  Diagnosis Date   Anxiety    Arthritis    Breast cancer (HCC)    Status post bilateral mastectomy   Breast reconstruction deformity    Complication of anesthesia    shaking-after mastectomies-resp and hr dropped   Depression, major    suicidal thoughts -implants, mental health, 05/11   Dyspnea    Fibromyalgia    GERD (gastroesophageal reflux disease)    Hypothyroidism    Narcolepsy    OSA on CPAP    PONV (postoperative nausea and vomiting)    Sleep apnea with use of continuous positive airway pressure (CPAP)    AHI 20 in HST, titration to 6 cm water  - residual AHi 6 . remaining hypersomnic.    Past Surgical History:  Procedure Laterality Date   ARTHRODESIS METATARSALPHALANGEAL JOINT (MTPJ) Right 07/28/2017   Procedure: ARTHRODESIS  RIGHT HALLUX METATARSALPHALANGEAL JOINT (MTPJ);  Surgeon: Kit Rush, MD;  Location: [facility];  Service: Orthopedics;  Laterality: Right;   Breast reconstructioin  7/09   flaps-bilat mastectomies   BTL     CARPAL TUNNEL RELEASE  2011   both hands   CARPOMETACARPEL SUSPENSION PLASTY Right 08/29/2014   Procedure: REMOVAL DERMASPAN SUSPENSIONPLASTY RIGHT THUMB ABDUCTOR POLLIS TRANSFER;  Surgeon: Arley Curia, MD;  Location: [facility];  Service:  Orthopedics;  Laterality: Right;   CATARACT EXTRACTION W/PHACO Right 12/17/2019   Procedure: CATARACT EXTRACTION PHACO AND INTRAOCULAR LENS PLACEMENT (IOC);  Surgeon: Harrie Agent, MD;  Location: [facility];  Service: Ophthalmology;  Laterality: Right;  CDE: 6.54   CATARACT EXTRACTION W/PHACO Left 12/31/2019   Procedure: CATARACT EXTRACTION PHACO AND INTRAOCULAR LENS PLACEMENT (IOC);  Surgeon: Harrie Agent, MD;  Location: [facility];  Service: Ophthalmology;  Laterality: Left;  CDE: 3.66   COLONOSCOPY W/ POLYPECTOMY  4/03   tubulovillous adenoma   FINGER ARTHROSCOPY WITH CARPOMETACARPEL (CMC) ARTHROPLASTY Right 06/20/2014   Procedure: ARTHROSCOPY RIGHT THUMB CARPOMETACARPEL GRAFT JACKET INTERPOSITION PARTIAL TRAPEZIECTOMY POSSIBLE OPEN;  Surgeon: Arley Curia, MD;  Location: [facility];  Service: Orthopedics;  Laterality: Right;   GANGLION CYST EXCISION     both [phone]   HAMMER TOE SURGERY Right 07/28/2017   Procedure: THIRD HAMMERTOE CORRECTION;  Surgeon: Kit Rush, MD;  Location: [facility];  Service: Orthopedics;  Laterality: Right;   HARDWARE REMOVAL Right 07/28/2017   Procedure: REMOVAL OF DEEP IMPLANTS;  Surgeon: Kit Rush, MD;  Location: [facility];  Service: Orthopedics;  Laterality: Right;   hysterestomy  2005   KNEE ARTHROPLASTY Left 09/15/2022   Procedure: COMPUTER ASSISTED TOTAL KNEE ARTHROPLASTY;  Surgeon: Fidel Rogue, MD;  Location: [facility];  Service: Orthopedics;  Laterality: Left;   KNEE ARTHROPLASTY Right 08/02/2024   Procedure: ARTHROPLASTY, KNEE, TOTAL, USING IMAGELESS COMPUTER-ASSISTED NAVIGATION;  Surgeon: Fidel Rogue, MD;  Location: [facility];  Service: Orthopedics;  Laterality: Right;   MASTECTOMY Bilateral 2008   post- a -cath  2008   right knee arthroscopy     TOTAL SHOULDER ARTHROPLASTY Left 01/13/2017   Procedure: LEFT TOTAL SHOULDER ARTHROPLASTY;  Surgeon: Eva Herring, MD;  Location: [facility];  Service: Orthopedics;   Laterality: Left;  LEFT TOTAL SHOULDER ARTHROPLASTY   TOTAL SHOULDER ARTHROPLASTY Right 02/11/2022   Procedure: TOTAL SHOULDER ARTHROPLASTY;  Surgeon: Sharl Selinda Dover, MD;  Location: [facility];  Service: Orthopedics;  Laterality: Right;  150   TRIGGER FINGER RELEASE Right 08/29/2014   Procedure: RELEASE TRIGGER FINGER/A-1 PULLEY;  Surgeon: Arley Curia, MD;  Location: [facility];  Service: Orthopedics;  Laterality: Right;   wisdom teeth out     Patient Active Problem List   Diagnosis Date Noted   Osteoarthritis of right knee 08/02/2024   S/P total knee arthroplasty, right 08/02/2024   Osteoarthritis of left knee 09/15/2022   History of total shoulder replacement, right 02/11/2022   Osteoarthritis of left shoulder 01/13/2017   S/P shoulder replacement, left 01/13/2017   OSA on CPAP 04/01/2015   Subacute confusional state 04/01/2015   Hallucinations, visual 04/01/2015   Hypnapompic hallucinations 04/01/2015   Sleep apnea with use of continuous positive airway pressure (CPAP)    Hypersomnia, persistent 03/30/2013   Obstructive sleep apnea (adult) (pediatric) 03/30/2013   Constipation 11/18/2008   RECTAL BLEEDING 11/18/2008   Internal hemorrhoids 11/15/2008   Diverticulosis of colon 11/15/2008   CELLULITIS/ABSCESS, TRUNK 02/22/2008   LOW BACK PAIN 02/22/2008   DIZZINESS 02/22/2008   FATIGUE 02/22/2008   PALPITATIONS 02/22/2008   MURMUR, CARDIAC, UNDIAGNOSED 02/22/2008   BREAST CANCER, HX OF 02/22/2008   COUGH 01/05/2008   DEPRESSION 07/05/2007   UTERINE POLYP 07/05/2007   FIBROMYALGIA 07/05/2007   History of colonic polyps 07/05/2007   HX, PERSONAL, CERVICAL DYSPLASIA 07/05/2007    PCP: Loreli Elsie JONETTA Mickey., MD  REFERRING PROVIDER: Leigh Valery RAMAN, PA-C  REFERRING DIAG: F82.88 (ICD-10-CM) - Unilateral primary osteoarthritis, right knee  THERAPY DIAG:  Acute pain of right knee  Stiffness of right knee, not elsewhere classified  Difficulty in walking, not  elsewhere classified  Rationale for Evaluation and Treatment: Rehabilitation  ONSET DATE: 08/02/24  SUBJECTIVE:   SUBJECTIVE STATEMENT: Been doing light cleaning around the house with breaks. Missed all last week due to stomach issues; returns today still without AD, reports generally doing well with pain around 4/10. Pain gets as high as 7/10 with increased activity.   Eval: Patient reports that she had a right knee replacement on 08/02/24. She notes that it has been swelling up quite a bit and hurting since surgery. She was not given any exercise to do after surgery. She notes that her muscle relaxer's and pain medication are helping some.   PERTINENT HISTORY: OA, fibromyalgia, depression, history of cancer, and current smoker PAIN:  Are you having pain? Yes: NPRS scale: Current:4/10 Pain location: right knee and leg Pain description: burning, throbbing, and aching  Aggravating factors: bending her knee, movement Relieving factors: medication and ice  PRECAUTIONS: Fall  RED FLAGS: None   WEIGHT BEARING RESTRICTIONS: No  FALLS:  Has patient fallen in last 6 months? Yes. Number of falls 4-5; most recent was last week when she fell out of bed into the floor  LIVING ENVIRONMENT: Lives with:  lives with their family Lives in: House/apartment Stairs: Yes: External: 8 steps; can reach both Has following equipment at home: Walker - 2 wheeled  OCCUPATION: not working  PLOF: Independent  PATIENT GOALS: improved mobility and reduced pain  NEXT MD VISIT: 08/20/24  OBJECTIVE:  Note: Objective measures were completed at Evaluation unless otherwise noted.  DIAGNOSTIC FINDINGS: EXAM: 1 or 2 VIEW(S) XRAY OF THE RIGHT KNEE 08/02/2024 03:02:00 PM   COMPARISON: None available.   CLINICAL HISTORY: S/P total knee arthroplasty, right.   FINDINGS:   BONES AND JOINTS: Right total knee arthroplasty in place. Gas in joint space compatible with immediate postoperative state. No  acute fracture. No focal osseous lesion. No joint dislocation. No significant joint effusion.   SOFT TISSUES: Gas in overlying soft tissues compatible with immediate postoperative state.   IMPRESSION: 1. Right total knee arthroplasty with expected immediate postoperative appearance. 2. Intra-articular and soft tissue gas compatible with immediate postoperative state.  PATIENT SURVEYS:  LEFS: 12 / 80 = 15.0 %  COGNITION: Overall cognitive status: Within functional limits for tasks assessed     SENSATION: WFL  EDEMA:  Circumferential: L tibiofemoral joint line: 40.5 cm R tibiofemoral joint line: 43.4   PALPATION: TTP: globally around right knee and lower leg  LOWER EXTREMITY ROM:  Active ROM Right eval Left eval Right 09/04/24 Right 09/18/24 AROM/PROM  Hip flexion      Hip extension      Hip abduction      Hip adduction      Hip internal rotation      Hip external rotation      Knee flexion 47 PROM: 60 118 90 102/105  Knee extension 8 0 -5 -3/0  Ankle dorsiflexion      Ankle plantarflexion      Ankle inversion      Ankle eversion       (Blank rows = not tested)   FUNCTIONAL TESTS:  STS: avoids WB through RLE  GAIT: Assistive device utilized: Environmental [employer]ant - 2 wheeled Level of assistance: Modified independence Comments: step through pattern with R knee flexed in stance phase                                                                                                                                 TREATMENT DATE:  09/18/24 Bike seat 10 rocking 4 minutes (closer to full ROM) Standing:  Rt knee flexion 12 step for increasing flexion 5X10   Rt knee hamstring curl 10X  Tandem stance max of 15 of either LE leading without UE assist  SLS max of 4 trials no UE Lt: 8, Rt: 4  Vectors 5X5 each side 1 UE standing on Lt, bil UE standing on Rt  Supine: heelslides 5X Manual to Rt knee to reduce tightness/scar tissue (lateral knee with most) AROM Rt -3 to 102,  PROM 0-105   09/06/24 Bike seat 10 rocking 4 minutes Standing:  Rt knee  flexion 12 step for increasing flexion 5X10   Rt knee hamstring curl 10X  Hip abduction 10X each LE Supine:  quad sets 10X 5 RT   SAQ 10X5  Bridge 10X Manual to Rt knee to reduce tightness/scar tissue (lateral knee with most) AROM Rt -3 to 100 Measured Rt LE and given ETI sheet    09/04/24 Bike seat 10 rocking 4 minutes Standing:  Rt knee flexion 12 step for increasing flexion 5X10   Rt knee hamstring curl 10X Seated: heelslides with towel 10X  LAQ 2X10 with 3 holds Sit to stands no UE feet even 10X Supine:  quad sets 10X 5 RT   SAQ 10X5  SLR with QS 5X Manual to Rt knee to reduce tightness/scar tissue (lateral knee with most) AROM Rt -5 to 90   PATIENT EDUCATION:  Education details: POC, healing, edema, HEP, and expectations following surgery  Person educated: Patient Education method: Explanation Education comprehension: verbalized understanding  HOME EXERCISE PROGRAM: Access Code: [email or web address] URL: [email or web address] Date: 08/13/2024 Prepared by: Lacinda Fass  Exercises - Supine Ankle Pumps  - 1 x daily - 7 x weekly - 2 sets - 10 reps - Supine Quad Set  - 1 x daily - 7 x weekly - 2 sets - 10 reps - 3-5 seconds hold - Supine Heel Slide  - 1 x daily - 7 x weekly - 2 sets - 10 reps  ASSESSMENT:  CLINICAL IMPRESSION: Continued focus on improving Rt knee AROM, strength and stabilization.  Still unable to make full revolutions on bike, however is getting closer to doing so.  Began static balance challenges including tandem stance and single leg stance.  Pt was able to maintain tandem stance max of 15 with either LE leading without UE and no more than 6 with single leg stance . Vectors added to work on improving this with ability to complete with only one UE while standing on Lt, however required bil UE for Rt.  Continued with manual to scar and surrounding tissue to reduce  adhesions and improve mobility.  AROM  slowly improving with better tolerance to passive as well. Patient will continue to benefit from skilled physical therapy for decreased R knee pain, increased endurance with ambulation, increased R knee strength/ROM, and improved balance for improved quality of life, improved independence with gait training and continued progress towards therapy goals.     OBJECTIVE IMPAIRMENTS: Abnormal gait, decreased activity tolerance, decreased balance, decreased knowledge of condition, decreased mobility, difficulty walking, decreased ROM, decreased strength, hypomobility, increased edema, impaired flexibility, impaired tone, and pain.   ACTIVITY LIMITATIONS: lifting, sitting, standing, squatting, stairs, transfers, bed mobility, and locomotion level  PARTICIPATION LIMITATIONS: shopping and community activity  PERSONAL FACTORS: Past/current experiences, Time since onset of injury/illness/exacerbation, and 3+ comorbidities: OA, fibromyalgia, depression, history of cancer, and current smoker are also affecting patient's functional outcome.   REHAB POTENTIAL: Good  CLINICAL DECISION MAKING: Evolving/moderate complexity  EVALUATION COMPLEXITY: Moderate   GOALS: Goals reviewed with patient? No  SHORT TERM GOALS: Target date: 08/27/24  Patient will demonstrate evidence of independence with individualized HEP and will report compliance for at least 3 days per week for optimized progression towards remaining therapy goals. Baseline:  Goal status: INITIAL  2.  Patient will report a decrease in pain level during community ambulation by at least 2 points for improved quality of life. Baseline:  Goal status: INITIAL     LONG TERM GOALS: Target date: 10/01/24  Pt will demonstrate a an increase of  at least 9 points on the LEFS for improved performance of community ambulation and ADL. Baseline:  Goal status: INITIAL  2.  Pt will be able to navigate at least 3  steps with a reciprocal pattern for improved function with household mobility. Baseline:   Goal status: INITIAL  3.  Pt will ambulate with no significant gait deviations for improved community mobility.  Baseline:   Goal status: INITIAL  4.  Patient will improve her right knee flexion to at least 115 degrees for improved function navigating steps.  Baseline:   Goal status: INITIAL  5.  Patient will improve her right knee extension to within 5 degrees of neutral for improved gait mechanics.  Baseline:   Goal status: INITIAL    PLAN:  PT FREQUENCY: 1-2x/week  PT DURATION: other: 7 weeks  PLANNED INTERVENTIONS: [id number]- PT Re-evaluation, 97750- Physical Performance Testing, 97110-Therapeutic exercises, 97530- Therapeutic activity, [id number]- Neuromuscular re-education, 97535- Self Care, [id number]- Manual therapy, [id number]- Gait training, [phone]- Electrical stimulation (unattended), 97016- Vasopneumatic device, Patient/Family education, Balance training, Stair training, Joint mobilization, Cryotherapy, and Moist heat  PLAN FOR NEXT SESSION: update HEP as needed. Progress AROM and PROM right knee flexion and extension.    Greig KATHEE Fuse, PTA/CLT [facility] Ph: [phone] 2:24 PM, 09/18/24

## 2024-09-20 ENCOUNTER — Encounter (HOSPITAL_COMMUNITY): Payer: Self-pay

## 2024-09-20 ENCOUNTER — Ambulatory Visit (HOSPITAL_COMMUNITY)

## 2024-09-20 DIAGNOSIS — M25561 Pain in right knee: Secondary | ICD-10-CM | POA: Diagnosis not present

## 2024-09-20 DIAGNOSIS — M25661 Stiffness of right knee, not elsewhere classified: Secondary | ICD-10-CM

## 2024-09-20 DIAGNOSIS — R600 Localized edema: Secondary | ICD-10-CM

## 2024-09-20 DIAGNOSIS — R262 Difficulty in walking, not elsewhere classified: Secondary | ICD-10-CM

## 2024-09-20 NOTE — Therapy (Signed)
 OUTPATIENT PHYSICAL THERAPY LOWER EXTREMITY TREATMENT   Patient Name: [name] MRN: [id number] DOB:01/24/1961, 63 y.o., female Today's Date: 09/20/2024  END OF SESSION:  PT End of Session - 09/20/24 1417     Visit Number 9    Number of Visits 14    Date for Recertification  10/05/24    Authorization Type [employer] [place] Preferred    Authorization Time Period No auth    Progress Note Due on Visit 10    PT Start Time 1417    PT Stop Time 1452    PT Time Calculation (min) 35 min    Activity Tolerance Patient limited by pain;Patient tolerated treatment well    Behavior During Therapy [facility] for tasks assessed/performed                Past Medical History:  Diagnosis Date   Anxiety    Arthritis    Breast cancer (HCC)    Status post bilateral mastectomy   Breast reconstruction deformity    Complication of anesthesia    shaking-after mastectomies-resp and hr dropped   Depression, major    suicidal thoughts -implants, mental health, 05/11   Dyspnea    Fibromyalgia    GERD (gastroesophageal reflux disease)    Hypothyroidism    Narcolepsy    OSA on CPAP    PONV (postoperative nausea and vomiting)    Sleep apnea with use of continuous positive airway pressure (CPAP)    AHI 20 in HST, titration to 6 cm water  - residual AHi 6 . remaining hypersomnic.    Past Surgical History:  Procedure Laterality Date   ARTHRODESIS METATARSALPHALANGEAL JOINT (MTPJ) Right 07/28/2017   Procedure: ARTHRODESIS  RIGHT HALLUX METATARSALPHALANGEAL JOINT (MTPJ);  Surgeon: Kit Rush, MD;  Location: [facility];  Service: Orthopedics;  Laterality: Right;   Breast reconstructioin  7/09   flaps-bilat mastectomies   BTL     CARPAL TUNNEL RELEASE  2011   both hands   CARPOMETACARPEL SUSPENSION PLASTY Right 08/29/2014   Procedure: REMOVAL DERMASPAN SUSPENSIONPLASTY RIGHT THUMB ABDUCTOR POLLIS TRANSFER;  Surgeon: Arley Curia, MD;  Location: [facility];  Service:  Orthopedics;  Laterality: Right;   CATARACT EXTRACTION W/PHACO Right 12/17/2019   Procedure: CATARACT EXTRACTION PHACO AND INTRAOCULAR LENS PLACEMENT (IOC);  Surgeon: Harrie Agent, MD;  Location: [facility];  Service: Ophthalmology;  Laterality: Right;  CDE: 6.54   CATARACT EXTRACTION W/PHACO Left 12/31/2019   Procedure: CATARACT EXTRACTION PHACO AND INTRAOCULAR LENS PLACEMENT (IOC);  Surgeon: Harrie Agent, MD;  Location: [facility];  Service: Ophthalmology;  Laterality: Left;  CDE: 3.66   COLONOSCOPY W/ POLYPECTOMY  4/03   tubulovillous adenoma   FINGER ARTHROSCOPY WITH CARPOMETACARPEL (CMC) ARTHROPLASTY Right 06/20/2014   Procedure: ARTHROSCOPY RIGHT THUMB CARPOMETACARPEL GRAFT JACKET INTERPOSITION PARTIAL TRAPEZIECTOMY POSSIBLE OPEN;  Surgeon: Arley Curia, MD;  Location: [facility];  Service: Orthopedics;  Laterality: Right;   GANGLION CYST EXCISION     both [phone]   HAMMER TOE SURGERY Right 07/28/2017   Procedure: THIRD HAMMERTOE CORRECTION;  Surgeon: Kit Rush, MD;  Location: [facility];  Service: Orthopedics;  Laterality: Right;   HARDWARE REMOVAL Right 07/28/2017   Procedure: REMOVAL OF DEEP IMPLANTS;  Surgeon: Kit Rush, MD;  Location: [facility];  Service: Orthopedics;  Laterality: Right;   hysterestomy  2005   KNEE ARTHROPLASTY Left 09/15/2022   Procedure: COMPUTER ASSISTED TOTAL KNEE ARTHROPLASTY;  Surgeon: Fidel Rogue, MD;  Location: [facility];  Service: Orthopedics;  Laterality: Left;   KNEE ARTHROPLASTY Right 08/02/2024   Procedure: ARTHROPLASTY, KNEE, TOTAL, USING IMAGELESS COMPUTER-ASSISTED NAVIGATION;  Surgeon: Fidel Rogue, MD;  Location: [facility];  Service: Orthopedics;  Laterality: Right;   MASTECTOMY Bilateral 2008   post- a -cath  2008   right knee arthroscopy     TOTAL SHOULDER ARTHROPLASTY Left 01/13/2017   Procedure: LEFT TOTAL SHOULDER ARTHROPLASTY;  Surgeon: Eva Herring, MD;  Location: [facility];  Service: Orthopedics;   Laterality: Left;  LEFT TOTAL SHOULDER ARTHROPLASTY   TOTAL SHOULDER ARTHROPLASTY Right 02/11/2022   Procedure: TOTAL SHOULDER ARTHROPLASTY;  Surgeon: Sharl Selinda Dover, MD;  Location: [facility];  Service: Orthopedics;  Laterality: Right;  150   TRIGGER FINGER RELEASE Right 08/29/2014   Procedure: RELEASE TRIGGER FINGER/A-1 PULLEY;  Surgeon: Arley Curia, MD;  Location: [facility];  Service: Orthopedics;  Laterality: Right;   wisdom teeth out     Patient Active Problem List   Diagnosis Date Noted   Osteoarthritis of right knee 08/02/2024   S/P total knee arthroplasty, right 08/02/2024   Osteoarthritis of left knee 09/15/2022   History of total shoulder replacement, right 02/11/2022   Osteoarthritis of left shoulder 01/13/2017   S/P shoulder replacement, left 01/13/2017   OSA on CPAP 04/01/2015   Subacute confusional state 04/01/2015   Hallucinations, visual 04/01/2015   Hypnapompic hallucinations 04/01/2015   Sleep apnea with use of continuous positive airway pressure (CPAP)    Hypersomnia, persistent 03/30/2013   Obstructive sleep apnea (adult) (pediatric) 03/30/2013   Constipation 11/18/2008   RECTAL BLEEDING 11/18/2008   Internal hemorrhoids 11/15/2008   Diverticulosis of colon 11/15/2008   CELLULITIS/ABSCESS, TRUNK 02/22/2008   LOW BACK PAIN 02/22/2008   DIZZINESS 02/22/2008   FATIGUE 02/22/2008   PALPITATIONS 02/22/2008   MURMUR, CARDIAC, UNDIAGNOSED 02/22/2008   BREAST CANCER, HX OF 02/22/2008   COUGH 01/05/2008   DEPRESSION 07/05/2007   UTERINE POLYP 07/05/2007   FIBROMYALGIA 07/05/2007   History of colonic polyps 07/05/2007   HX, PERSONAL, CERVICAL DYSPLASIA 07/05/2007    PCP: Loreli Elsie JONETTA Mickey., MD  REFERRING PROVIDER: Leigh Valery RAMAN, PA-C  REFERRING DIAG: F82.88 (ICD-10-CM) - Unilateral primary osteoarthritis, right knee  THERAPY DIAG:  Acute pain of right knee  Stiffness of right knee, not elsewhere classified  Difficulty in walking, not  elsewhere classified  Localized edema  Rationale for Evaluation and Treatment: Rehabilitation  ONSET DATE: 08/02/24  SUBJECTIVE:   SUBJECTIVE STATEMENT: Pt states she was standing on it quite a bit this morning and knee is sore today. Pt states she has been dealing with sone muscle spasms in knee. Pain rating 4-5/10.   Eval: Patient reports that she had a right knee replacement on 08/02/24. She notes that it has been swelling up quite a bit and hurting since surgery. She was not given any exercise to do after surgery. She notes that her muscle relaxer's and pain medication are helping some.   PERTINENT HISTORY: OA, fibromyalgia, depression, history of cancer, and current smoker PAIN:  Are you having pain? Yes: NPRS scale: Current:4/10 Pain location: right knee and leg Pain description: burning, throbbing, and aching  Aggravating factors: bending her knee, movement Relieving factors: medication and ice  PRECAUTIONS: Fall  RED FLAGS: None   WEIGHT BEARING RESTRICTIONS: No  FALLS:  Has patient fallen in last 6 months? Yes. Number of falls 4-5; most recent was last week when she fell out of bed into the floor  LIVING ENVIRONMENT: Lives with: lives with their family  Lives in: House/apartment Stairs: Yes: External: 8 steps; can reach both Has following equipment at home: Walker - 2 wheeled  OCCUPATION: not working  PLOF: Independent  PATIENT GOALS: improved mobility and reduced pain  NEXT MD VISIT: 08/20/24  OBJECTIVE:  Note: Objective measures were completed at Evaluation unless otherwise noted.  DIAGNOSTIC FINDINGS: EXAM: 1 or 2 VIEW(S) XRAY OF THE RIGHT KNEE 08/02/2024 03:02:00 PM   COMPARISON: None available.   CLINICAL HISTORY: S/P total knee arthroplasty, right.   FINDINGS:   BONES AND JOINTS: Right total knee arthroplasty in place. Gas in joint space compatible with immediate postoperative state. No acute fracture. No focal osseous lesion. No joint  dislocation. No significant joint effusion.   SOFT TISSUES: Gas in overlying soft tissues compatible with immediate postoperative state.   IMPRESSION: 1. Right total knee arthroplasty with expected immediate postoperative appearance. 2. Intra-articular and soft tissue gas compatible with immediate postoperative state.  PATIENT SURVEYS:  LEFS: 12 / 80 = 15.0 %  COGNITION: Overall cognitive status: Within functional limits for tasks assessed     SENSATION: WFL  EDEMA:  Circumferential: L tibiofemoral joint line: 40.5 cm R tibiofemoral joint line: 43.4   PALPATION: TTP: globally around right knee and lower leg  LOWER EXTREMITY ROM:  Active ROM Right eval Left eval Right 09/04/24 Right 09/18/24 AROM/PROM  Hip flexion      Hip extension      Hip abduction      Hip adduction      Hip internal rotation      Hip external rotation      Knee flexion 47 PROM: 60 118 90 102/105  Knee extension 8 0 -5 -3/0  Ankle dorsiflexion      Ankle plantarflexion      Ankle inversion      Ankle eversion       (Blank rows = not tested)   FUNCTIONAL TESTS:  STS: avoids WB through RLE  GAIT: Assistive device utilized: Environmental Consultant - 2 wheeled Level of assistance: Modified independence Comments: step through pattern with R knee flexed in stance phase                                                                                                                                 TREATMENT DATE:  09/20/2024  Therapeutic Exercise: -Stationary bike rocking, 5 minutes, seat 10, pt cued fo -Knee drive and extension stretch on 12 inch step, 1 set of 8 reps, 5 second holds each direction, pt cued for OP with UE for extension and bodyweight for flexion -Standing calf stretch on incline, 1 set of 2 reps, 30 second holds, UE support on parallel bars, pt cued for alignment of shoulders, hips, knees, and ankles Therapeutic Activity: -Lateral step up and overs, 6 inch step, 1 sets of 5 reps,  bilaterally, pt unable to complete on 8 inch step, pt cued for BUE support as it is too painful with no  hands -Step up and overs, 8 inch step, 1 sets of 5 reps, bilaterally, pt cued for decreased UE support and increased step length for foot landing clearance -Aeromat walks in parallel bars, lateral stepping and tandem walking, 2 laps each variation, pt cued for decreased UE support  09/18/24 Bike seat 10 rocking 4 minutes (closer to full ROM) Standing:  Rt knee flexion 12 step for increasing flexion 5X10   Rt knee hamstring curl 10X  Tandem stance max of 15 of either LE leading without UE assist  SLS max of 4 trials no UE Lt: 8, Rt: 4  Vectors 5X5 each side 1 UE standing on Lt, bil UE standing on Rt  Supine: heelslides 5X Manual to Rt knee to reduce tightness/scar tissue (lateral knee with most) AROM Rt -3 to 102, PROM 0-105   09/06/24 Bike seat 10 rocking 4 minutes Standing:  Rt knee flexion 12 step for increasing flexion 5X10   Rt knee hamstring curl 10X  Hip abduction 10X each LE Supine:  quad sets 10X 5 RT   SAQ 10X5  Bridge 10X Manual to Rt knee to reduce tightness/scar tissue (lateral knee with most) AROM Rt -3 to 100 Measured Rt LE and given ETI sheet    PATIENT EDUCATION:  Education details: POC, healing, edema, HEP, and expectations following surgery  Person educated: Patient Education method: Explanation Education comprehension: verbalized understanding  HOME EXERCISE PROGRAM: Access Code: [email or web address] URL: [email or web address] Date: 08/13/2024 Prepared by: Lacinda Fass  Exercises - Supine Ankle Pumps  - 1 x daily - 7 x weekly - 2 sets - 10 reps - Supine Quad Set  - 1 x daily - 7 x weekly - 2 sets - 10 reps - 3-5 seconds hold - Supine Heel Slide  - 1 x daily - 7 x weekly - 2 sets - 10 reps  ASSESSMENT:  CLINICAL IMPRESSION: Patient continues to demonstrate decreased RLE strength/ROM, decreased gait quality and balance. Patient also  demonstrates decreased endurance with aerobic based exercise during today's session. Pt demonstrates increased soreness today and is not able to complete progressed activities today. Patient able to continue dynamic balance and R knee activation exercises today with aeromat walks and knee drive stretch, good performance with verbal cueing. Patient would continue to benefit from skilled physical therapy for decreased R knee stiffness/pain, increased endurance with ambulation, increased RLE strength, and improved balance for improved quality of life, improved independence with rehab of R knee and continued progress towards therapy goals.      OBJECTIVE IMPAIRMENTS: Abnormal gait, decreased activity tolerance, decreased balance, decreased knowledge of condition, decreased mobility, difficulty walking, decreased ROM, decreased strength, hypomobility, increased edema, impaired flexibility, impaired tone, and pain.   ACTIVITY LIMITATIONS: lifting, sitting, standing, squatting, stairs, transfers, bed mobility, and locomotion level  PARTICIPATION LIMITATIONS: shopping and community activity  PERSONAL FACTORS: Past/current experiences, Time since onset of injury/illness/exacerbation, and 3+ comorbidities: OA, fibromyalgia, depression, history of cancer, and current smoker are also affecting patient's functional outcome.   REHAB POTENTIAL: Good  CLINICAL DECISION MAKING: Evolving/moderate complexity  EVALUATION COMPLEXITY: Moderate   GOALS: Goals reviewed with patient? No  SHORT TERM GOALS: Target date: 08/27/24  Patient will demonstrate evidence of independence with individualized HEP and will report compliance for at least 3 days per week for optimized progression towards remaining therapy goals. Baseline:  Goal status: INITIAL  2.  Patient will report a decrease in pain level during community ambulation by at least 2 points for improved quality of life. Baseline:  Goal status: INITIAL      LONG TERM GOALS: Target date: 10/01/24  Pt will demonstrate a an increase of at least 9 points on the LEFS for improved performance of community ambulation and ADL. Baseline:  Goal status: INITIAL  2.  Pt will be able to navigate at least 3 steps with a reciprocal pattern for improved function with household mobility. Baseline:   Goal status: INITIAL  3.  Pt will ambulate with no significant gait deviations for improved community mobility.  Baseline:   Goal status: INITIAL  4.  Patient will improve her right knee flexion to at least 115 degrees for improved function navigating steps.  Baseline:   Goal status: INITIAL  5.  Patient will improve her right knee extension to within 5 degrees of neutral for improved gait mechanics.  Baseline:   Goal status: INITIAL    PLAN:  PT FREQUENCY: 1-2x/week  PT DURATION: other: 7 weeks  PLANNED INTERVENTIONS: [id number]- PT Re-evaluation, 97750- Physical Performance Testing, 97110-Therapeutic exercises, 97530- Therapeutic activity, [id number]- Neuromuscular re-education, 97535- Self Care, [id number]- Manual therapy, [id number]- Gait training, [phone]- Electrical stimulation (unattended), [id number]- Vasopneumatic device, Patient/Family education, Balance training, Stair training, Joint mobilization, Cryotherapy, and Moist heat  PLAN FOR NEXT SESSION: update HEP as needed. Progress AROM and PROM right knee flexion and extension.    Lang Ada, PT, DPT [facility] Office: [phone] 2:51 PM, 09/20/24

## 2024-09-25 ENCOUNTER — Ambulatory Visit (HOSPITAL_COMMUNITY): Admitting: Physical Therapy

## 2024-09-25 DIAGNOSIS — R262 Difficulty in walking, not elsewhere classified: Secondary | ICD-10-CM

## 2024-09-25 DIAGNOSIS — M25661 Stiffness of right knee, not elsewhere classified: Secondary | ICD-10-CM

## 2024-09-25 DIAGNOSIS — M25561 Pain in right knee: Secondary | ICD-10-CM | POA: Diagnosis not present

## 2024-09-25 NOTE — Therapy (Signed)
 OUTPATIENT PHYSICAL THERAPY LOWER EXTREMITY TREATMENT Progress Note Reporting Period 08/13/24 to 09/25/24  See note below for Objective Data and Assessment of Progress/Goals.       Patient Name: [name] MRN: [id number] DOB:11-11-1960, 63 y.o., female Today's Date: 09/25/2024  END OF SESSION:  PT End of Session - 09/25/24 1433     Visit Number 10    Number of Visits 14    Date for Recertification  10/05/24    Authorization Type [employer]erred    Authorization Time Period No auth    Progress Note Due on Visit 10    PT Start Time 1421    PT Stop Time 1500    PT Time Calculation (min) 39 min    Activity Tolerance Patient limited by pain;Patient tolerated treatment well    Behavior During Therapy [facility] for tasks assessed/performed                 Past Medical History:  Diagnosis Date   Anxiety    Arthritis    Breast cancer (HCC)    Status post bilateral mastectomy   Breast reconstruction deformity    Complication of anesthesia    shaking-after mastectomies-resp and hr dropped   Depression, major    suicidal thoughts -implants, mental health, 05/11   Dyspnea    Fibromyalgia    GERD (gastroesophageal reflux disease)    Hypothyroidism    Narcolepsy    OSA on CPAP    PONV (postoperative nausea and vomiting)    Sleep apnea with use of continuous positive airway pressure (CPAP)    AHI 20 in HST, titration to 6 cm water  - residual AHi 6 . remaining hypersomnic.    Past Surgical History:  Procedure Laterality Date   ARTHRODESIS METATARSALPHALANGEAL JOINT (MTPJ) Right 07/28/2017   Procedure: ARTHRODESIS  RIGHT HALLUX METATARSALPHALANGEAL JOINT (MTPJ);  Surgeon: Kit Rush, MD;  Location: [facility];  Service: Orthopedics;  Laterality: Right;   Breast reconstructioin  7/09   flaps-bilat mastectomies   BTL     CARPAL TUNNEL RELEASE  2011   both hands   CARPOMETACARPEL SUSPENSION PLASTY Right 08/29/2014   Procedure: REMOVAL  DERMASPAN SUSPENSIONPLASTY RIGHT THUMB ABDUCTOR POLLIS TRANSFER;  Surgeon: Arley Curia, MD;  Location: [facility];  Service: Orthopedics;  Laterality: Right;   CATARACT EXTRACTION W/PHACO Right 12/17/2019   Procedure: CATARACT EXTRACTION PHACO AND INTRAOCULAR LENS PLACEMENT (IOC);  Surgeon: Harrie Agent, MD;  Location: [facility];  Service: Ophthalmology;  Laterality: Right;  CDE: 6.54   CATARACT EXTRACTION W/PHACO Left 12/31/2019   Procedure: CATARACT EXTRACTION PHACO AND INTRAOCULAR LENS PLACEMENT (IOC);  Surgeon: Harrie Agent, MD;  Location: [facility];  Service: Ophthalmology;  Laterality: Left;  CDE: 3.66   COLONOSCOPY W/ POLYPECTOMY  4/03   tubulovillous adenoma   FINGER ARTHROSCOPY WITH CARPOMETACARPEL (CMC) ARTHROPLASTY Right 06/20/2014   Procedure: ARTHROSCOPY RIGHT THUMB CARPOMETACARPEL GRAFT JACKET INTERPOSITION PARTIAL TRAPEZIECTOMY POSSIBLE OPEN;  Surgeon: Arley Curia, MD;  Location: [facility];  Service: Orthopedics;  Laterality: Right;   GANGLION CYST EXCISION     both [phone]   HAMMER TOE SURGERY Right 07/28/2017   Procedure: THIRD HAMMERTOE CORRECTION;  Surgeon: Kit Rush, MD;  Location: [facility];  Service: Orthopedics;  Laterality: Right;   HARDWARE REMOVAL Right 07/28/2017   Procedure: REMOVAL OF DEEP IMPLANTS;  Surgeon: Kit Rush, MD;  Location: [facility];  Service: Orthopedics;  Laterality: Right;   hysterestomy  2005   KNEE  ARTHROPLASTY Left 09/15/2022   Procedure: COMPUTER ASSISTED TOTAL KNEE ARTHROPLASTY;  Surgeon: Fidel Rogue, MD;  Location: [facility];  Service: Orthopedics;  Laterality: Left;   KNEE ARTHROPLASTY Right 08/02/2024   Procedure: ARTHROPLASTY, KNEE, TOTAL, USING IMAGELESS COMPUTER-ASSISTED NAVIGATION;  Surgeon: Fidel Rogue, MD;  Location: [facility];  Service: Orthopedics;  Laterality: Right;   MASTECTOMY Bilateral 2008   post- a -cath  2008   right knee arthroscopy     TOTAL SHOULDER ARTHROPLASTY  Left 01/13/2017   Procedure: LEFT TOTAL SHOULDER ARTHROPLASTY;  Surgeon: Eva Herring, MD;  Location: [facility];  Service: Orthopedics;  Laterality: Left;  LEFT TOTAL SHOULDER ARTHROPLASTY   TOTAL SHOULDER ARTHROPLASTY Right 02/11/2022   Procedure: TOTAL SHOULDER ARTHROPLASTY;  Surgeon: Sharl Selinda Dover, MD;  Location: [facility];  Service: Orthopedics;  Laterality: Right;  150   TRIGGER FINGER RELEASE Right 08/29/2014   Procedure: RELEASE TRIGGER FINGER/A-1 PULLEY;  Surgeon: Arley Curia, MD;  Location: [facility];  Service: Orthopedics;  Laterality: Right;   wisdom teeth out     Patient Active Problem List   Diagnosis Date Noted   Osteoarthritis of right knee 08/02/2024   S/P total knee arthroplasty, right 08/02/2024   Osteoarthritis of left knee 09/15/2022   History of total shoulder replacement, right 02/11/2022   Osteoarthritis of left shoulder 01/13/2017   S/P shoulder replacement, left 01/13/2017   OSA on CPAP 04/01/2015   Subacute confusional state 04/01/2015   Hallucinations, visual 04/01/2015   Hypnapompic hallucinations 04/01/2015   Sleep apnea with use of continuous positive airway pressure (CPAP)    Hypersomnia, persistent 03/30/2013   Obstructive sleep apnea (adult) (pediatric) 03/30/2013   Constipation 11/18/2008   RECTAL BLEEDING 11/18/2008   Internal hemorrhoids 11/15/2008   Diverticulosis of colon 11/15/2008   CELLULITIS/ABSCESS, TRUNK 02/22/2008   LOW BACK PAIN 02/22/2008   DIZZINESS 02/22/2008   FATIGUE 02/22/2008   PALPITATIONS 02/22/2008   MURMUR, CARDIAC, UNDIAGNOSED 02/22/2008   BREAST CANCER, HX OF 02/22/2008   COUGH 01/05/2008   DEPRESSION 07/05/2007   UTERINE POLYP 07/05/2007   FIBROMYALGIA 07/05/2007   History of colonic polyps 07/05/2007   HX, PERSONAL, CERVICAL DYSPLASIA 07/05/2007    PCP: Loreli Elsie JONETTA Mickey., MD  REFERRING PROVIDER: Leigh Valery RAMAN, PA-C  REFERRING DIAG: F82.88 (ICD-10-CM) - Unilateral primary osteoarthritis, right  knee  THERAPY DIAG:  Acute pain of right knee  Stiffness of right knee, not elsewhere classified  Difficulty in walking, not elsewhere classified  Rationale for Evaluation and Treatment: Rehabilitation  ONSET DATE: 08/02/24  SUBJECTIVE:   SUBJECTIVE STATEMENT: Pt states her pain varies painfree (@night  in bed with meds) to as high as 8/10.  Averages 4/10.  Pt feels she's 75% improved.  Comes today without AD, no antalgia.    Eval: Patient reports that she had a right knee replacement on 08/02/24. She notes that it has been swelling up quite a bit and hurting since surgery. She was not given any exercise to do after surgery. She notes that her muscle relaxer's and pain medication are helping some.   PERTINENT HISTORY: OA, fibromyalgia, depression, history of cancer, and current smoker PAIN:  Are you having pain? Yes: NPRS scale: Current:4/10 Pain location: right knee and leg Pain description: burning, throbbing, and aching  Aggravating factors: bending her knee, movement Relieving factors: medication and ice  PRECAUTIONS: Fall  RED FLAGS: None   WEIGHT BEARING RESTRICTIONS: No  FALLS:  Has patient fallen in last 6 months? Yes. Number of falls 4-5; most  recent was last week when she fell out of bed into the floor  LIVING ENVIRONMENT: Lives with: lives with their family Lives in: House/apartment Stairs: Yes: External: 8 steps; can reach both Has following equipment at home: Walker - 2 wheeled  OCCUPATION: not working  PLOF: Independent  PATIENT GOALS: improved mobility and reduced pain  NEXT MD VISIT: 08/20/24  OBJECTIVE:  Note: Objective measures were completed at Evaluation unless otherwise noted.  DIAGNOSTIC FINDINGS: EXAM: 1 or 2 VIEW(S) XRAY OF THE RIGHT KNEE 08/02/2024 03:02:00 PM   COMPARISON: None available.   CLINICAL HISTORY: S/P total knee arthroplasty, right.   FINDINGS:   BONES AND JOINTS: Right total knee arthroplasty in place. Gas in  joint space compatible with immediate postoperative state. No acute fracture. No focal osseous lesion. No joint dislocation. No significant joint effusion.   SOFT TISSUES: Gas in overlying soft tissues compatible with immediate postoperative state.   IMPRESSION: 1. Right total knee arthroplasty with expected immediate postoperative appearance. 2. Intra-articular and soft tissue gas compatible with immediate postoperative state.  PATIENT SURVEYS:  LEFS: eval:  12 / 80 = 15.0 %    09/25/24:  26 / 80 = 32.5 % COGNITION: Overall cognitive status: Within functional limits for tasks assessed     SENSATION: WFL  EDEMA:  Circumferential: L tibiofemoral joint line: 40.5 cm R tibiofemoral joint line: 43.4   PALPATION: TTP: globally around right knee and lower leg  LOWER EXTREMITY ROM:  Active ROM Right eval Left eval Right 09/04/24 Right 09/18/24 AROM/PROM Right 09/25/24 A/PROM  Hip flexion       Hip extension       Hip abduction       Hip adduction       Hip internal rotation       Hip external rotation       Knee flexion 47 PROM: 60 118 90 102/105 108/110  Knee extension 8 0 -5 -3/0 -2/0  Ankle dorsiflexion       Ankle plantarflexion       Ankle inversion       Ankle eversion        (Blank rows = not tested)   FUNCTIONAL TESTS:  STS: avoids WB through RLE  GAIT: Assistive device utilized: Environmental Consultant - 2 wheeled Level of assistance: Modified independence Comments: step through pattern with R knee flexed in stance phase                                                                                                                                 TREATMENT DATE:  09/25/24 Bike seat 11 full revolutions  4 minutes  Progress note:  LEFS improved 14 points, AROM -2 to 108 Rt knee Standing:  Rt knee flexion 12 step for increasing flexion 5X10   Rt knee hamstring curl 10X  Tandem stance max of 15 of either LE leading without UE assist  SLS max of 4 trials no  UE  Lt: 8, Rt: 4  Vectors 5X5 each side 1 UE standing on Lt, bil UE standing on Rt  Supine: heelslides 5X Manual to Rt knee to reduce tightness/scar tissue (lateral knee with most) AROM Rt -3 to 102, PROM 0-105  09/20/2024  Therapeutic Exercise: -Stationary bike rocking, 5 minutes, seat 10, pt cued fo -Knee drive and extension stretch on 12 inch step, 1 set of 8 reps, 5 second holds each direction, pt cued for OP with UE for extension and bodyweight for flexion -Standing calf stretch on incline, 1 set of 2 reps, 30 second holds, UE support on parallel bars, pt cued for alignment of shoulders, hips, knees, and ankles Therapeutic Activity: -Lateral step up and overs, 6 inch step, 1 sets of 5 reps, bilaterally, pt unable to complete on 8 inch step, pt cued for BUE support as it is too painful with no hands -Step up and overs, 8 inch step, 1 sets of 5 reps, bilaterally, pt cued for decreased UE support and increased step length for foot landing clearance -[employer] walks in parallel bars, lateral stepping and tandem walking, 2 laps each variation, pt cued for decreased UE support  09/18/24 Bike seat 10 rocking 4 minutes (closer to full ROM) Standing:  Rt knee flexion 12 step for increasing flexion 5X10   Rt knee hamstring curl 10X  Tandem stance max of 15 of either LE leading without UE assist  SLS max of 4 trials no UE Lt: 8, Rt: 4  Vectors 5X5 each side 1 UE standing on Lt, bil UE standing on Rt  Supine: heelslides 5X Manual to Rt knee to reduce tightness/scar tissue (lateral knee with most) AROM Rt -3 to 102, PROM 0-105   09/06/24 Bike seat 10 rocking 4 minutes Standing:  Rt knee flexion 12 step for increasing flexion 5X10   Rt knee hamstring curl 10X  Hip abduction 10X each LE Supine:  quad sets 10X 5 RT   SAQ 10X5  Bridge 10X Manual to Rt knee to reduce tightness/scar tissue (lateral knee with most) AROM Rt -3 to 100 Measured Rt LE and given ETI sheet    PATIENT  EDUCATION:  Education details: POC, healing, edema, HEP, and expectations following surgery  Person educated: Patient Education method: Explanation Education comprehension: verbalized understanding  HOME EXERCISE PROGRAM: Access Code: [email or web address] URL: [email or web address] Date: 08/13/2024 Prepared by: Lacinda Fass  Exercises - Supine Ankle Pumps  - 1 x daily - 7 x weekly - 2 sets - 10 reps - Supine Quad Set  - 1 x daily - 7 x weekly - 2 sets - 10 reps - 3-5 seconds hold - Supine Heel Slide  - 1 x daily - 7 x weekly - 2 sets - 10 reps  ASSESSMENT:  CLINICAL IMPRESSION: 10th visit PN completed this session.  Pt has met all short term goals and 2/5 long term goals. Pt states her biggest limitations at this point include prolonged standing, stair negotiation, community ambulation and general tightness/pain in knee.  Pt was able to make full revolution on bike today and has overall increased AROM in both extension and flexion.  Tightness and scar tissue persist perimeter and along scar, however does respond well to manual with overall reduction.  Pt will continue to benefit from skilled therapy to progress toward unmet goals and improve overall function. Pt would like to return to [facility].     OBJECTIVE IMPAIRMENTS: Abnormal gait, decreased activity tolerance, decreased balance, decreased knowledge of condition, decreased mobility,  difficulty walking, decreased ROM, decreased strength, hypomobility, increased edema, impaired flexibility, impaired tone, and pain.   ACTIVITY LIMITATIONS: lifting, sitting, standing, squatting, stairs, transfers, bed mobility, and locomotion level  PARTICIPATION LIMITATIONS: shopping and community activity  PERSONAL FACTORS: Past/current experiences, Time since onset of injury/illness/exacerbation, and 3+ comorbidities: OA, fibromyalgia, depression, history of cancer, and current smoker are also affecting patient's functional outcome.   REHAB  POTENTIAL: Good  CLINICAL DECISION MAKING: Evolving/moderate complexity  EVALUATION COMPLEXITY: Moderate   GOALS: Goals reviewed with patient? Yes  SHORT TERM GOALS: Target date: 08/27/24  Patient will demonstrate evidence of independence with individualized HEP and will report compliance for at least 3 days per week for optimized progression towards remaining therapy goals. Baseline:  Goal status: MET  2.  Patient will report a decrease in pain level during community ambulation by at least 2 points for improved quality of life. Baseline:  Goal status: MET     LONG TERM GOALS: Target date: 10/01/24  Pt will demonstrate a an increase of at least 9 points on the LEFS for improved performance of community ambulation and ADL. Baseline:  Goal status: MET  2.  Pt will be able to navigate at least 3 steps with a reciprocal pattern for improved function with household mobility. Baseline:   Goal status: IN PROGRESS  3.  Pt will ambulate with no significant gait deviations for improved community mobility.  Baseline:   Goal status: IN PROGRESS  4.  Patient will improve her right knee flexion to at least 115 degrees for improved function navigating steps.  Baseline:   Goal status: IN PROGRESS  5.  Patient will improve her right knee extension to within 5 degrees of neutral for improved gait mechanics.  Baseline:   Goal status: MET    PLAN:  PT FREQUENCY: 1-2x/week  PT DURATION: other: 7 weeks  PLANNED INTERVENTIONS: [id number]- PT Re-evaluation, 97750- Physical Performance Testing, 97110-Therapeutic exercises, 97530- Therapeutic activity, [id number]- Neuromuscular re-education, 97535- Self Care, [id number]- Manual therapy, [id number]- Gait training, [phone]- Electrical stimulation (unattended), 97016- Vasopneumatic device, Patient/Family education, Balance training, Stair training, Joint mobilization, Cryotherapy, and Moist heat  PLAN FOR NEXT SESSION: update HEP as needed. Progress AROM and PROM  right knee flexion and extension.  Increase functional strength and progress to reciprocal stair negotiation.    Greig KATHEE Fuse, PTA/CLT [facility] Ph: [phone] 2:33 PM, 09/25/24

## 2024-09-27 ENCOUNTER — Ambulatory Visit (HOSPITAL_COMMUNITY)

## 2024-09-27 ENCOUNTER — Encounter (HOSPITAL_COMMUNITY): Payer: Self-pay

## 2024-09-27 DIAGNOSIS — M25561 Pain in right knee: Secondary | ICD-10-CM

## 2024-09-27 DIAGNOSIS — R262 Difficulty in walking, not elsewhere classified: Secondary | ICD-10-CM

## 2024-09-27 DIAGNOSIS — R600 Localized edema: Secondary | ICD-10-CM

## 2024-09-27 DIAGNOSIS — M25661 Stiffness of right knee, not elsewhere classified: Secondary | ICD-10-CM

## 2024-09-27 NOTE — Therapy (Signed)
 OUTPATIENT PHYSICAL THERAPY LOWER EXTREMITY TREATMENT     Patient Name: [name] MRN: [id number] DOB:01/09/1961, 63 y.o., female Today's Date: 09/27/2024  END OF SESSION:  PT End of Session - 09/27/24 1425     Visit Number 11    Number of Visits 14    Date for Recertification  10/05/24    Authorization Type [employer] [place] Preferred    Authorization Time Period No auth    Progress Note Due on Visit 14    PT Start Time 1425   Late arrival   PT Stop Time 1453    PT Time Calculation (min) 28 min    Activity Tolerance Patient limited by pain;Patient tolerated treatment well    Behavior During Therapy [facility] for tasks assessed/performed                  Past Medical History:  Diagnosis Date   Anxiety    Arthritis    Breast cancer (HCC)    Status post bilateral mastectomy   Breast reconstruction deformity    Complication of anesthesia    shaking-after mastectomies-resp and hr dropped   Depression, major    suicidal thoughts -implants, mental health, 05/11   Dyspnea    Fibromyalgia    GERD (gastroesophageal reflux disease)    Hypothyroidism    Narcolepsy    OSA on CPAP    PONV (postoperative nausea and vomiting)    Sleep apnea with use of continuous positive airway pressure (CPAP)    AHI 20 in HST, titration to 6 cm water  - residual AHi 6 . remaining hypersomnic.    Past Surgical History:  Procedure Laterality Date   ARTHRODESIS METATARSALPHALANGEAL JOINT (MTPJ) Right 07/28/2017   Procedure: ARTHRODESIS  RIGHT HALLUX METATARSALPHALANGEAL JOINT (MTPJ);  Surgeon: Kit Rush, MD;  Location: [facility];  Service: Orthopedics;  Laterality: Right;   Breast reconstructioin  7/09   flaps-bilat mastectomies   BTL     CARPAL TUNNEL RELEASE  2011   both hands   CARPOMETACARPEL SUSPENSION PLASTY Right 08/29/2014   Procedure: REMOVAL DERMASPAN SUSPENSIONPLASTY RIGHT THUMB ABDUCTOR POLLIS TRANSFER;  Surgeon: Arley Curia, MD;  Location: [facility];  Service: Orthopedics;  Laterality: Right;   CATARACT EXTRACTION W/PHACO Right 12/17/2019   Procedure: CATARACT EXTRACTION PHACO AND INTRAOCULAR LENS PLACEMENT (IOC);  Surgeon: Harrie Agent, MD;  Location: [facility];  Service: Ophthalmology;  Laterality: Right;  CDE: 6.54   CATARACT EXTRACTION W/PHACO Left 12/31/2019   Procedure: CATARACT EXTRACTION PHACO AND INTRAOCULAR LENS PLACEMENT (IOC);  Surgeon: Harrie Agent, MD;  Location: [facility];  Service: Ophthalmology;  Laterality: Left;  CDE: 3.66   COLONOSCOPY W/ POLYPECTOMY  4/03   tubulovillous adenoma   FINGER ARTHROSCOPY WITH CARPOMETACARPEL (CMC) ARTHROPLASTY Right 06/20/2014   Procedure: ARTHROSCOPY RIGHT THUMB CARPOMETACARPEL GRAFT JACKET INTERPOSITION PARTIAL TRAPEZIECTOMY POSSIBLE OPEN;  Surgeon: Arley Curia, MD;  Location: [facility];  Service: Orthopedics;  Laterality: Right;   GANGLION CYST EXCISION     both [phone]   HAMMER TOE SURGERY Right 07/28/2017   Procedure: THIRD HAMMERTOE CORRECTION;  Surgeon: Kit Rush, MD;  Location: [facility];  Service: Orthopedics;  Laterality: Right;   HARDWARE REMOVAL Right 07/28/2017   Procedure: REMOVAL OF DEEP IMPLANTS;  Surgeon: Kit Rush, MD;  Location: [facility];  Service: Orthopedics;  Laterality: Right;   hysterestomy  2005   KNEE ARTHROPLASTY Left 09/15/2022   Procedure: COMPUTER ASSISTED TOTAL KNEE ARTHROPLASTY;  Surgeon: Fidel Rogue, MD;  Location: [facility];  Service: Orthopedics;  Laterality: Left;   KNEE ARTHROPLASTY Right 08/02/2024   Procedure: ARTHROPLASTY, KNEE, TOTAL, USING IMAGELESS COMPUTER-ASSISTED NAVIGATION;  Surgeon: Fidel Rogue, MD;  Location: [facility];  Service: Orthopedics;  Laterality: Right;   MASTECTOMY Bilateral 2008   post- a -cath  2008   right knee arthroscopy     TOTAL SHOULDER ARTHROPLASTY Left 01/13/2017   Procedure: LEFT TOTAL SHOULDER ARTHROPLASTY;  Surgeon: Eva Herring, MD;  Location: [facility];   Service: Orthopedics;  Laterality: Left;  LEFT TOTAL SHOULDER ARTHROPLASTY   TOTAL SHOULDER ARTHROPLASTY Right 02/11/2022   Procedure: TOTAL SHOULDER ARTHROPLASTY;  Surgeon: Sharl Selinda Dover, MD;  Location: [facility];  Service: Orthopedics;  Laterality: Right;  150   TRIGGER FINGER RELEASE Right 08/29/2014   Procedure: RELEASE TRIGGER FINGER/A-1 PULLEY;  Surgeon: Arley Curia, MD;  Location: [facility];  Service: Orthopedics;  Laterality: Right;   wisdom teeth out     Patient Active Problem List   Diagnosis Date Noted   Osteoarthritis of right knee 08/02/2024   S/P total knee arthroplasty, right 08/02/2024   Osteoarthritis of left knee 09/15/2022   History of total shoulder replacement, right 02/11/2022   Osteoarthritis of left shoulder 01/13/2017   S/P shoulder replacement, left 01/13/2017   OSA on CPAP 04/01/2015   Subacute confusional state 04/01/2015   Hallucinations, visual 04/01/2015   Hypnapompic hallucinations 04/01/2015   Sleep apnea with use of continuous positive airway pressure (CPAP)    Hypersomnia, persistent 03/30/2013   Obstructive sleep apnea (adult) (pediatric) 03/30/2013   Constipation 11/18/2008   RECTAL BLEEDING 11/18/2008   Internal hemorrhoids 11/15/2008   Diverticulosis of colon 11/15/2008   CELLULITIS/ABSCESS, TRUNK 02/22/2008   LOW BACK PAIN 02/22/2008   DIZZINESS 02/22/2008   FATIGUE 02/22/2008   PALPITATIONS 02/22/2008   MURMUR, CARDIAC, UNDIAGNOSED 02/22/2008   BREAST CANCER, HX OF 02/22/2008   COUGH 01/05/2008   DEPRESSION 07/05/2007   UTERINE POLYP 07/05/2007   FIBROMYALGIA 07/05/2007   History of colonic polyps 07/05/2007   HX, PERSONAL, CERVICAL DYSPLASIA 07/05/2007    PCP: Loreli Elsie JONETTA Mickey., MD  REFERRING PROVIDER: Leigh Valery RAMAN, PA-C  REFERRING DIAG: F82.88 (ICD-10-CM) - Unilateral primary osteoarthritis, right knee  THERAPY DIAG:  Acute pain of right knee  Stiffness of right knee, not elsewhere  classified  Difficulty in walking, not elsewhere classified  Localized edema  Rationale for Evaluation and Treatment: Rehabilitation  ONSET DATE: 08/02/24  SUBJECTIVE:   SUBJECTIVE STATEMENT: Pt states she is in 4/10 pain this date. Pt states she was a little sore since last session. Pt states HEP compliance a little everyday. Pt states she feels like she will be ready for discharge after 2 more visits, plans to start back at [facility] after the first of the year.   Eval: Patient reports that she had a right knee replacement on 08/02/24. She notes that it has been swelling up quite a bit and hurting since surgery. She was not given any exercise to do after surgery. She notes that her muscle relaxer's and pain medication are helping some.   PERTINENT HISTORY: OA, fibromyalgia, depression, history of cancer, and current smoker PAIN:  Are you having pain? Yes: NPRS scale: Current:4/10 Pain location: right knee and leg Pain description: burning, throbbing, and aching  Aggravating factors: bending her knee, movement Relieving factors: medication and ice  PRECAUTIONS: Fall  RED FLAGS: None   WEIGHT BEARING RESTRICTIONS: No  FALLS:  Has patient fallen in last 6 months?  Yes. Number of falls 4-5; most recent was last week when she fell out of bed into the floor  LIVING ENVIRONMENT: Lives with: lives with their family Lives in: House/apartment Stairs: Yes: External: 8 steps; can reach both Has following equipment at home: Walker - 2 wheeled  OCCUPATION: not working  PLOF: Independent  PATIENT GOALS: improved mobility and reduced pain  NEXT MD VISIT: 08/20/24  OBJECTIVE:  Note: Objective measures were completed at Evaluation unless otherwise noted.  DIAGNOSTIC FINDINGS: EXAM: 1 or 2 VIEW(S) XRAY OF THE RIGHT KNEE 08/02/2024 03:02:00 PM   COMPARISON: None available.   CLINICAL HISTORY: S/P total knee arthroplasty, right.   FINDINGS:   BONES AND JOINTS: Right total knee  arthroplasty in place. Gas in joint space compatible with immediate postoperative state. No acute fracture. No focal osseous lesion. No joint dislocation. No significant joint effusion.   SOFT TISSUES: Gas in overlying soft tissues compatible with immediate postoperative state.   IMPRESSION: 1. Right total knee arthroplasty with expected immediate postoperative appearance. 2. Intra-articular and soft tissue gas compatible with immediate postoperative state.  PATIENT SURVEYS:  LEFS: eval:  12 / 80 = 15.0 %    09/25/24:  26 / 80 = 32.5 % COGNITION: Overall cognitive status: Within functional limits for tasks assessed     SENSATION: WFL  EDEMA:  Circumferential: L tibiofemoral joint line: 40.5 cm R tibiofemoral joint line: 43.4   PALPATION: TTP: globally around right knee and lower leg  LOWER EXTREMITY ROM:  Active ROM Right eval Left eval Right 09/04/24 Right 09/18/24 AROM/PROM Right 09/25/24 A/PROM  Hip flexion       Hip extension       Hip abduction       Hip adduction       Hip internal rotation       Hip external rotation       Knee flexion 47 PROM: 60 118 90 102/105 108/110  Knee extension 8 0 -5 -3/0 -2/0  Ankle dorsiflexion       Ankle plantarflexion       Ankle inversion       Ankle eversion        (Blank rows = not tested)   FUNCTIONAL TESTS:  STS: avoids WB through RLE  GAIT: Assistive device utilized: Environmental [employer]ant - 2 wheeled Level of assistance: Modified independence Comments: step through pattern with R knee flexed in stance phase                                                                                                                                 TREATMENT DATE:  09/27/2024  Therapeutic Exercise: -Stationary bike rocking, 5 minutes, seat 10, pt cued fo -Knee drive and extension stretch on 12 inch step, 1 set of 8 reps, 5 second holds each direction, pt cued for OP with UE for extension and bodyweight for flexion -Standing calf  stretch on incline, 1 set of 2  reps, 30 second holds, UE support on parallel bars, pt cued for alignment of shoulders, hips, knees, and ankles Therapeutic Activity: -Lateral step up and overs, 8 inch step, 1 sets of 5 reps, bilaterally, pt cued for BUE support as it is too painful with no hands -Step ups, 8 inch step, 2 bouts of 30 seconds, 6.5 reps, 8.5 reps, pt cued for decreased UE support  -Standing on blue foam with Kettle bell over head press and transfers swings from arm to arm and behind back, 30 second bouts, 1x each variation, pt requires CGA -Sled pushes, 2 laps, 20 lbs added weight, pt cued for increased pace second lap, 35.21 seconds  09/25/24 Bike seat 11 full revolutions  4 minutes  Progress note:  LEFS improved 14 points, AROM -2 to 108 Rt knee Standing:  Rt knee flexion 12 step for increasing flexion 5X10   Rt knee hamstring curl 10X  Tandem stance max of 15 of either LE leading without UE assist  SLS max of 4 trials no UE Lt: 8, Rt: 4  Vectors 5X5 each side 1 UE standing on Lt, bil UE standing on Rt  Supine: heelslides 5X Manual to Rt knee to reduce tightness/scar tissue (lateral knee with most) AROM Rt -3 to 102, PROM 0-105  09/20/2024  Therapeutic Exercise: -Stationary bike rocking, 5 minutes, seat 10, pt cued fo -Knee drive and extension stretch on 12 inch step, 1 set of 8 reps, 5 second holds each direction, pt cued for OP with UE for extension and bodyweight for flexion -Standing calf stretch on incline, 1 set of 2 reps, 30 second holds, UE support on parallel bars, pt cued for alignment of shoulders, hips, knees, and ankles Therapeutic Activity: -Lateral step up and overs, 6 inch step, 1 sets of 5 reps, bilaterally, pt unable to complete on 8 inch step, pt cued for BUE support as it is too painful with no hands -Step up and overs, 8 inch step, 1 sets of 5 reps, bilaterally, pt cued for decreased UE support and increased step length for foot landing  clearance -[employer]eromat walks in parallel bars, lateral stepping and tandem walking, 2 laps each variation, pt cued for decreased UE support  PATIENT EDUCATION:  Education details: POC, healing, edema, HEP, and expectations following surgery  Person educated: Patient Education method: Explanation Education comprehension: verbalized understanding  HOME EXERCISE PROGRAM: Access Code: [email or web address] URL: [email or web address] Date: 08/13/2024 Prepared by: Lacinda Fass  Exercises - Supine Ankle Pumps  - 1 x daily - 7 x weekly - 2 sets - 10 reps - Supine Quad Set  - 1 x daily - 7 x weekly - 2 sets - 10 reps - 3-5 seconds hold - Supine Heel Slide  - 1 x daily - 7 x weekly - 2 sets - 10 reps  ASSESSMENT:  CLINICAL IMPRESSION: Patient continues to demonstrate decreased RLE strength, decreased gait quality and balance. Patient also demonstrates decreased endurance with aerobic based exercise during today's session with multiple sitting rest breaks needed. Patient able to progress dynamic balance and core activation exercises today with step up variations and sled pushes, good performance with verbal cueing. Patient would continue to benefit from skilled physical therapy for increased endurance with ambulation, increased LE strength, and improved balance for improved quality of life, improved independence with management of knee rehab and continued progress towards therapy goals.      OBJECTIVE IMPAIRMENTS: Abnormal gait, decreased activity tolerance, decreased balance, decreased knowledge of condition, decreased mobility, difficulty walking,  decreased ROM, decreased strength, hypomobility, increased edema, impaired flexibility, impaired tone, and pain.   ACTIVITY LIMITATIONS: lifting, sitting, standing, squatting, stairs, transfers, bed mobility, and locomotion level  PARTICIPATION LIMITATIONS: shopping and community activity  PERSONAL FACTORS: Past/current experiences, Time since onset  of injury/illness/exacerbation, and 3+ comorbidities: OA, fibromyalgia, depression, history of cancer, and current smoker are also affecting patient's functional outcome.   REHAB POTENTIAL: Good  CLINICAL DECISION MAKING: Evolving/moderate complexity  EVALUATION COMPLEXITY: Moderate   GOALS: Goals reviewed with patient? Yes  SHORT TERM GOALS: Target date: 08/27/24  Patient will demonstrate evidence of independence with individualized HEP and will report compliance for at least 3 days per week for optimized progression towards remaining therapy goals. Baseline:  Goal status: MET  2.  Patient will report a decrease in pain level during community ambulation by at least 2 points for improved quality of life. Baseline:  Goal status: MET     LONG TERM GOALS: Target date: 10/01/24  Pt will demonstrate a an increase of at least 9 points on the LEFS for improved performance of community ambulation and ADL. Baseline:  Goal status: MET  2.  Pt will be able to navigate at least 3 steps with a reciprocal pattern for improved function with household mobility. Baseline:   Goal status: IN PROGRESS  3.  Pt will ambulate with no significant gait deviations for improved community mobility.  Baseline:   Goal status: IN PROGRESS  4.  Patient will improve her right knee flexion to at least 115 degrees for improved function navigating steps.  Baseline:   Goal status: IN PROGRESS  5.  Patient will improve her right knee extension to within 5 degrees of neutral for improved gait mechanics.  Baseline:   Goal status: MET    PLAN:  PT FREQUENCY: 1-2x/week  PT DURATION: other: 7 weeks  PLANNED INTERVENTIONS: [id number]- PT Re-evaluation, 97750- Physical Performance Testing, 97110-Therapeutic exercises, 97530- Therapeutic activity, [id number]- Neuromuscular re-education, 97535- Self Care, [id number]- Manual therapy, [id number]- Gait training, [phone]- Electrical stimulation (unattended), 97016- Vasopneumatic  device, Patient/Family education, Balance training, Stair training, Joint mobilization, Cryotherapy, and Moist heat  PLAN FOR NEXT SESSION: update HEP as needed. Progress AROM and PROM right knee flexion and extension.  Increase functional strength and progress to reciprocal stair negotiation.    Lang Ada, PT, DPT [facility] Office: [phone] 2:55 PM, 09/27/24

## 2024-10-01 ENCOUNTER — Encounter (HOSPITAL_COMMUNITY)

## 2024-10-03 ENCOUNTER — Ambulatory Visit (HOSPITAL_COMMUNITY)

## 2024-10-03 ENCOUNTER — Encounter (HOSPITAL_COMMUNITY): Payer: Self-pay

## 2024-10-03 DIAGNOSIS — M25561 Pain in right knee: Secondary | ICD-10-CM

## 2024-10-03 DIAGNOSIS — M25661 Stiffness of right knee, not elsewhere classified: Secondary | ICD-10-CM

## 2024-10-03 DIAGNOSIS — R600 Localized edema: Secondary | ICD-10-CM

## 2024-10-03 DIAGNOSIS — R262 Difficulty in walking, not elsewhere classified: Secondary | ICD-10-CM

## 2024-10-03 NOTE — Therapy (Signed)
 OUTPATIENT PHYSICAL THERAPY LOWER EXTREMITY TREATMENT     Patient Name: [name] MRN: [id number] DOB:January 06, 1961, 63 y.o., female Today's Date: 10/03/2024  END OF SESSION:  PT End of Session - 10/03/24 1421     Visit Number 12    Number of Visits 14    Date for Recertification  10/05/24    Authorization Type [employer] [place] Preferred    Authorization Time Period No auth    Progress Note Due on Visit 14    PT Start Time 1421    PT Stop Time 1447    PT Time Calculation (min) 26 min    Activity Tolerance Patient tolerated treatment well    Behavior During Therapy WFL for tasks assessed/performed                   Past Medical History:  Diagnosis Date   Anxiety    Arthritis    Breast cancer (HCC)    Status post bilateral mastectomy   Breast reconstruction deformity    Complication of anesthesia    shaking-after mastectomies-resp and hr dropped   Depression, major    suicidal thoughts -implants, mental health, 05/11   Dyspnea    Fibromyalgia    GERD (gastroesophageal reflux disease)    Hypothyroidism    Narcolepsy    OSA on CPAP    PONV (postoperative nausea and vomiting)    Sleep apnea with use of continuous positive airway pressure (CPAP)    AHI 20 in HST, titration to 6 cm water  - residual AHi 6 . remaining hypersomnic.    Past Surgical History:  Procedure Laterality Date   ARTHRODESIS METATARSALPHALANGEAL JOINT (MTPJ) Right 07/28/2017   Procedure: ARTHRODESIS  RIGHT HALLUX METATARSALPHALANGEAL JOINT (MTPJ);  Surgeon: Kit Rush, MD;  Location: [facility];  Service: Orthopedics;  Laterality: Right;   Breast reconstructioin  7/09   flaps-bilat mastectomies   BTL     CARPAL TUNNEL RELEASE  2011   both hands   CARPOMETACARPEL SUSPENSION PLASTY Right 08/29/2014   Procedure: REMOVAL DERMASPAN SUSPENSIONPLASTY RIGHT THUMB ABDUCTOR POLLIS TRANSFER;  Surgeon: Arley Curia, MD;  Location: [facility];  Service: Orthopedics;   Laterality: Right;   CATARACT EXTRACTION W/PHACO Right 12/17/2019   Procedure: CATARACT EXTRACTION PHACO AND INTRAOCULAR LENS PLACEMENT (IOC);  Surgeon: Harrie Agent, MD;  Location: [facility];  Service: Ophthalmology;  Laterality: Right;  CDE: 6.54   CATARACT EXTRACTION W/PHACO Left 12/31/2019   Procedure: CATARACT EXTRACTION PHACO AND INTRAOCULAR LENS PLACEMENT (IOC);  Surgeon: Harrie Agent, MD;  Location: [facility];  Service: Ophthalmology;  Laterality: Left;  CDE: 3.66   COLONOSCOPY W/ POLYPECTOMY  4/03   tubulovillous adenoma   FINGER ARTHROSCOPY WITH CARPOMETACARPEL (CMC) ARTHROPLASTY Right 06/20/2014   Procedure: ARTHROSCOPY RIGHT THUMB CARPOMETACARPEL GRAFT JACKET INTERPOSITION PARTIAL TRAPEZIECTOMY POSSIBLE OPEN;  Surgeon: Arley Curia, MD;  Location: [facility];  Service: Orthopedics;  Laterality: Right;   GANGLION CYST EXCISION     both [phone]   HAMMER TOE SURGERY Right 07/28/2017   Procedure: THIRD HAMMERTOE CORRECTION;  Surgeon: Kit Rush, MD;  Location: [facility];  Service: Orthopedics;  Laterality: Right;   HARDWARE REMOVAL Right 07/28/2017   Procedure: REMOVAL OF DEEP IMPLANTS;  Surgeon: Kit Rush, MD;  Location: [facility];  Service: Orthopedics;  Laterality: Right;   hysterestomy  2005   KNEE ARTHROPLASTY Left 09/15/2022   Procedure: COMPUTER ASSISTED TOTAL KNEE ARTHROPLASTY;  Surgeon: Fidel Rogue, MD;  Location: [facility];  Service: Orthopedics;  Laterality: Left;   KNEE ARTHROPLASTY Right 08/02/2024   Procedure: ARTHROPLASTY, KNEE, TOTAL, USING IMAGELESS COMPUTER-ASSISTED NAVIGATION;  Surgeon: Fidel Rogue, MD;  Location: [facility];  Service: Orthopedics;  Laterality: Right;   MASTECTOMY Bilateral 2008   post- a -cath  2008   right knee arthroscopy     TOTAL SHOULDER ARTHROPLASTY Left 01/13/2017   Procedure: LEFT TOTAL SHOULDER ARTHROPLASTY;  Surgeon: Eva Herring, MD;  Location: [facility];  Service: Orthopedics;  Laterality: Left;   LEFT TOTAL SHOULDER ARTHROPLASTY   TOTAL SHOULDER ARTHROPLASTY Right 02/11/2022   Procedure: TOTAL SHOULDER ARTHROPLASTY;  Surgeon: Sharl Selinda Dover, MD;  Location: [facility];  Service: Orthopedics;  Laterality: Right;  150   TRIGGER FINGER RELEASE Right 08/29/2014   Procedure: RELEASE TRIGGER FINGER/A-1 PULLEY;  Surgeon: Arley Curia, MD;  Location: [facility];  Service: Orthopedics;  Laterality: Right;   wisdom teeth out     Patient Active Problem List   Diagnosis Date Noted   Osteoarthritis of right knee 08/02/2024   S/P total knee arthroplasty, right 08/02/2024   Osteoarthritis of left knee 09/15/2022   History of total shoulder replacement, right 02/11/2022   Osteoarthritis of left shoulder 01/13/2017   S/P shoulder replacement, left 01/13/2017   OSA on CPAP 04/01/2015   Subacute confusional state 04/01/2015   Hallucinations, visual 04/01/2015   Hypnapompic hallucinations 04/01/2015   Sleep apnea with use of continuous positive airway pressure (CPAP)    Hypersomnia, persistent 03/30/2013   Obstructive sleep apnea (adult) (pediatric) 03/30/2013   Constipation 11/18/2008   RECTAL BLEEDING 11/18/2008   Internal hemorrhoids 11/15/2008   Diverticulosis of colon 11/15/2008   CELLULITIS/ABSCESS, TRUNK 02/22/2008   LOW BACK PAIN 02/22/2008   DIZZINESS 02/22/2008   FATIGUE 02/22/2008   PALPITATIONS 02/22/2008   MURMUR, CARDIAC, UNDIAGNOSED 02/22/2008   BREAST CANCER, HX OF 02/22/2008   COUGH 01/05/2008   DEPRESSION 07/05/2007   UTERINE POLYP 07/05/2007   FIBROMYALGIA 07/05/2007   History of colonic polyps 07/05/2007   HX, PERSONAL, CERVICAL DYSPLASIA 07/05/2007    PCP: Loreli Elsie JONETTA Mickey., MD  REFERRING PROVIDER: Leigh Valery RAMAN, PA-C  REFERRING DIAG: F82.88 (ICD-10-CM) - Unilateral primary osteoarthritis, right knee  THERAPY DIAG:  Acute pain of right knee  Stiffness of right knee, not elsewhere classified  Difficulty in walking, not elsewhere  classified  Localized edema  Rationale for Evaluation and Treatment: Rehabilitation  ONSET DATE: 08/02/24  SUBJECTIVE:   SUBJECTIVE STATEMENT: Patient reports that she feels comfortable discharging from physical therapy today. She feels that she is about 80% better since starting physical therapy.   Eval: Patient reports that she had a right knee replacement on 08/02/24. She notes that it has been swelling up quite a bit and hurting since surgery. She was not given any exercise to do after surgery. She notes that her muscle relaxer's and pain medication are helping some.   PERTINENT HISTORY: OA, fibromyalgia, depression, history of cancer, and current smoker PAIN:  Are you having pain? Yes: NPRS scale: 5/10 Pain location: right knee and leg Pain description: sore  Aggravating factors: bending her knee, movement Relieving factors: medication and ice  PRECAUTIONS: Fall  RED FLAGS: None   WEIGHT BEARING RESTRICTIONS: No  FALLS:  Has patient fallen in last 6 months? Yes. Number of falls 4-5; most recent was last week when she fell out of bed into the floor  LIVING ENVIRONMENT: Lives with: lives with their family Lives in: House/apartment Stairs: Yes: External: 8 steps; can  reach both Has following equipment at home: Vannie - 2 wheeled  OCCUPATION: not working  PLOF: Independent  PATIENT GOALS: improved mobility and reduced pain  NEXT MD VISIT: 08/20/24  OBJECTIVE:  Note: Objective measures were completed at Evaluation unless otherwise noted.  DIAGNOSTIC FINDINGS: EXAM: 1 or 2 VIEW(S) XRAY OF THE RIGHT KNEE 08/02/2024 03:02:00 PM   COMPARISON: None available.   CLINICAL HISTORY: S/P total knee arthroplasty, right.   FINDINGS:   BONES AND JOINTS: Right total knee arthroplasty in place. Gas in joint space compatible with immediate postoperative state. No acute fracture. No focal osseous lesion. No joint dislocation. No significant joint effusion.   SOFT  TISSUES: Gas in overlying soft tissues compatible with immediate postoperative state.   IMPRESSION: 1. Right total knee arthroplasty with expected immediate postoperative appearance. 2. Intra-articular and soft tissue gas compatible with immediate postoperative state.  PATIENT SURVEYS:  LEFS: eval:  12 / 80 = 15.0 %    09/25/24:  26 / 80 = 32.5 % COGNITION: Overall cognitive status: Within functional limits for tasks assessed     SENSATION: WFL  EDEMA:  Circumferential: L tibiofemoral joint line: 40.5 cm R tibiofemoral joint line: 43.4   PALPATION: TTP: globally around right knee and lower leg  LOWER EXTREMITY ROM:  Active ROM Right eval Left eval Right 09/04/24 Right 09/18/24 AROM/PROM Right 09/25/24 A/PROM Right 10/03/24  Hip flexion        Hip extension        Hip abduction        Hip adduction        Hip internal rotation        Hip external rotation        Knee flexion 47 PROM: 60 118 90 102/105 108/110 113  Knee extension 8 0 -5 -3/0 -2/0 1  Ankle dorsiflexion        Ankle plantarflexion        Ankle inversion        Ankle eversion         (Blank rows = not tested)   FUNCTIONAL TESTS:  STS: avoids WB through RLE  GAIT: Assistive device utilized: Environmental Consultant - 2 wheeled Level of assistance: Modified independence Comments: step through pattern with R knee flexed in stance phase                                                                                                                                 TREATMENT DATE:                                    10/03/24 EXERCISE LOG  Exercise Repetitions and Resistance Comments  Recumbent bike  L1 x 5 minutes   Goal assessment  See below    Patient education  HEP reviewed and updated, progress with physical therapy, and healing  Blank cell = exercise not performed today   09/27/2024  Therapeutic Exercise: -Stationary bike rocking, 5 minutes, seat 10, pt cued fo -Knee drive and extension  stretch on 12 inch step, 1 set of 8 reps, 5 second holds each direction, pt cued for OP with UE for extension and bodyweight for flexion -Standing calf stretch on incline, 1 set of 2 reps, 30 second holds, UE support on parallel bars, pt cued for alignment of shoulders, hips, knees, and ankles Therapeutic Activity: -Lateral step up and overs, 8 inch step, 1 sets of 5 reps, bilaterally, pt cued for BUE support as it is too painful with no hands -Step ups, 8 inch step, 2 bouts of 30 seconds, 6.5 reps, 8.5 reps, pt cued for decreased UE support  -Standing on blue foam with Kettle bell over head press and transfers swings from arm to arm and behind back, 30 second bouts, 1x each variation, pt requires CGA -Sled pushes, 2 laps, 20 lbs added weight, pt cued for increased pace second lap, 35.21 seconds  09/25/24 Bike seat 11 full revolutions  4 minutes  Progress note:  LEFS improved 14 points, AROM -2 to 108 Rt knee Standing:  Rt knee flexion 12 step for increasing flexion 5X10   Rt knee hamstring curl 10X  Tandem stance max of 15 of either LE leading without UE assist  SLS max of 4 trials no UE Lt: 8, Rt: 4  Vectors 5X5 each side 1 UE standing on Lt, bil UE standing on Rt  Supine: heelslides 5X Manual to Rt knee to reduce tightness/scar tissue (lateral knee with most) AROM Rt -3 to 102, PROM 0-105  PATIENT EDUCATION:  Education details: POC, healing, edema, HEP, and expectations following surgery  Person educated: Patient Education method: Explanation Education comprehension: verbalized understanding  HOME EXERCISE PROGRAM: Access Code: [email or web address] URL: [email or web address] Date: 10/03/2024 Prepared by: Lacinda Fass  Exercises - Step Up  - 1 x daily - 7 x weekly - 3 sets - 10 reps - Lateral Step Up  - 1 x daily - 7 x weekly - 3 sets - 10 reps - Standing Tandem Balance with Counter Support  - 1 x daily - 7 x weekly - 3 sets - 30 seconds  hold - Supine Heel Slide  - 1  x daily - 7 x weekly - 3 sets - 10 reps - Standing Single Leg Stance with Counter Support  - 1 x daily - 7 x weekly - 3 sets - 30 seconds hold - Seated Long Arc Quad  - 1 x daily - 7 x weekly - 3 sets - 10 reps - 5 second hold  Access Code: [email or web address] URL: [email or web address] Date: 08/13/2024 Prepared by: Lacinda Fass  Exercises - Supine Ankle Pumps  - 1 x daily - 7 x weekly - 2 sets - 10 reps - Supine Quad Set  - 1 x daily - 7 x weekly - 2 sets - 10 reps - 3-5 seconds hold - Supine Heel Slide  - 1 x daily - 7 x weekly - 2 sets - 10 reps  ASSESSMENT:  CLINICAL IMPRESSION: Patient has made good progress with skilled physical therapy as evidenced by her subjective reports, objective measures, functional mobility, and progress toward her goals. She was able to meet most of her goals at this time. However, she was 2 degrees from meeting her long term goal for 115 degrees of right knee flexion. Her HEP was reviewed and updated with familiar interventions. She reported  feeling comfortable with these interventions. She requested to be discharged from physical therapy at this time.   PHYSICAL THERAPY DISCHARGE SUMMARY  Visits from Start of Care: 12  Current functional level related to goals / functional outcomes: Patient was able to meet most of her goals for skilled physical therapy.   Remaining deficits: Soreness and AROM    Education / Equipment: HEP    Patient agrees to discharge. Patient goals were partially met. Patient is being discharged due to being pleased with the current functional level.   OBJECTIVE IMPAIRMENTS: Abnormal gait, decreased activity tolerance, decreased balance, decreased knowledge of condition, decreased mobility, difficulty walking, decreased ROM, decreased strength, hypomobility, increased edema, impaired flexibility, impaired tone, and pain.   ACTIVITY LIMITATIONS: lifting, sitting, standing, squatting, stairs, transfers, bed mobility, and  locomotion level  PARTICIPATION LIMITATIONS: shopping and community activity  PERSONAL FACTORS: Past/current experiences, Time since onset of injury/illness/exacerbation, and 3+ comorbidities: OA, fibromyalgia, depression, history of cancer, and current smoker are also affecting patient's functional outcome.   REHAB POTENTIAL: Good  CLINICAL DECISION MAKING: Evolving/moderate complexity  EVALUATION COMPLEXITY: Moderate   GOALS: Goals reviewed with patient? Yes  SHORT TERM GOALS: Target date: 08/27/24  Patient will demonstrate evidence of independence with individualized HEP and will report compliance for at least 3 days per week for optimized progression towards remaining therapy goals. Baseline:  Goal status: MET  2.  Patient will report a decrease in pain level during community ambulation by at least 2 points for improved quality of life. Baseline:  Goal status: MET     LONG TERM GOALS: Target date: 10/01/24  Pt will demonstrate a an increase of at least 9 points on the LEFS for improved performance of community ambulation and ADL. Baseline:  Goal status: MET  2.  Pt will be able to navigate at least 3 steps with a reciprocal pattern for improved function with household mobility. Baseline:  able to perform, but prefers step to pattern Goal status: MET  3.  Pt will ambulate with no significant gait deviations for improved community mobility.  Baseline:   Goal status: MET  4.  Patient will improve her right knee flexion to at least 115 degrees for improved function navigating steps.  Baseline:  113 degrees Goal status: NEARLY MET  5.  Patient will improve her right knee extension to within 5 degrees of neutral for improved gait mechanics.  Baseline:   Goal status: MET    PLAN:  PT FREQUENCY: 1-2x/week  PT DURATION: other: 7 weeks  PLANNED INTERVENTIONS: [id number]- PT Re-evaluation, 97750- Physical Performance Testing, 97110-Therapeutic exercises, 97530-  Therapeutic activity, [id number]- Neuromuscular re-education, 97535- Self Care, [id number]- Manual therapy, [id number]- Gait training, [phone]- Electrical stimulation (unattended), 97016- Vasopneumatic device, Patient/Family education, Balance training, Stair training, Joint mobilization, Cryotherapy, and Moist heat  PLAN FOR NEXT SESSION: update HEP as needed. Progress AROM and PROM right knee flexion and extension.  Increase functional strength and progress to reciprocal stair negotiation.    Lacinda Fass, PT, DPT  3:02 PM, 10/03/24
